# Patient Record
Sex: Female | Born: 1935 | Race: White | Hispanic: No | Smoking: Never smoker
Health system: Southern US, Community
[De-identification: ages and names within clinical notes are randomized; demographics above are authoritative.]

## PROBLEM LIST (undated history)

## (undated) DIAGNOSIS — I4891 Unspecified atrial fibrillation: Secondary | ICD-10-CM

## (undated) DIAGNOSIS — E119 Type 2 diabetes mellitus without complications: Secondary | ICD-10-CM

## (undated) DIAGNOSIS — I272 Pulmonary hypertension, unspecified: Secondary | ICD-10-CM

## (undated) DIAGNOSIS — I2510 Atherosclerotic heart disease of native coronary artery without angina pectoris: Secondary | ICD-10-CM

## (undated) DIAGNOSIS — R0902 Hypoxemia: Secondary | ICD-10-CM

## (undated) DIAGNOSIS — Z9889 Other specified postprocedural states: Secondary | ICD-10-CM

## (undated) DIAGNOSIS — E785 Hyperlipidemia, unspecified: Secondary | ICD-10-CM

## (undated) DIAGNOSIS — R001 Bradycardia, unspecified: Secondary | ICD-10-CM

## (undated) DIAGNOSIS — M1990 Unspecified osteoarthritis, unspecified site: Secondary | ICD-10-CM

## (undated) DIAGNOSIS — Z8679 Personal history of other diseases of the circulatory system: Secondary | ICD-10-CM

## (undated) DIAGNOSIS — IMO0002 Reserved for concepts with insufficient information to code with codable children: Secondary | ICD-10-CM

## (undated) DIAGNOSIS — I10 Essential (primary) hypertension: Secondary | ICD-10-CM

## (undated) DIAGNOSIS — I469 Cardiac arrest, cause unspecified: Secondary | ICD-10-CM

## (undated) DIAGNOSIS — I5032 Chronic diastolic (congestive) heart failure: Secondary | ICD-10-CM

## (undated) DIAGNOSIS — J90 Pleural effusion, not elsewhere classified: Secondary | ICD-10-CM

## (undated) HISTORY — PX: APPENDECTOMY: SHX54

## (undated) HISTORY — PX: CHOLECYSTECTOMY: SHX55

## (undated) HISTORY — PX: EYE SURGERY: SHX253

## (undated) HISTORY — PX: ABDOMINAL HYSTERECTOMY: SHX81

---

## 1998-08-10 ENCOUNTER — Encounter: Payer: Self-pay | Admitting: *Deleted

## 1998-08-10 ENCOUNTER — Ambulatory Visit (HOSPITAL_COMMUNITY): Admission: RE | Admit: 1998-08-10 | Discharge: 1998-08-10 | Payer: Self-pay | Admitting: *Deleted

## 2012-05-06 ENCOUNTER — Inpatient Hospital Stay (HOSPITAL_COMMUNITY)
Admission: RE | Admit: 2012-05-06 | Discharge: 2012-05-27 | DRG: 216 | Disposition: A | Discharge status: Skilled Nursing Facility | Payer: Medicare Other | Source: Other Acute Inpatient Hospital | Attending: Thoracic Surgery (Cardiothoracic Vascular Surgery) | Admitting: Thoracic Surgery (Cardiothoracic Vascular Surgery)

## 2012-05-06 ENCOUNTER — Encounter (HOSPITAL_COMMUNITY): Payer: Self-pay | Admitting: Physician Assistant

## 2012-05-06 ENCOUNTER — Inpatient Hospital Stay (HOSPITAL_COMMUNITY): Payer: Medicare Other

## 2012-05-06 DIAGNOSIS — R5381 Other malaise: Secondary | ICD-10-CM | POA: Diagnosis not present

## 2012-05-06 DIAGNOSIS — Z794 Long term (current) use of insulin: Secondary | ICD-10-CM

## 2012-05-06 DIAGNOSIS — J189 Pneumonia, unspecified organism: Secondary | ICD-10-CM | POA: Diagnosis present

## 2012-05-06 DIAGNOSIS — I4891 Unspecified atrial fibrillation: Secondary | ICD-10-CM

## 2012-05-06 DIAGNOSIS — I509 Heart failure, unspecified: Secondary | ICD-10-CM | POA: Diagnosis present

## 2012-05-06 DIAGNOSIS — N289 Disorder of kidney and ureter, unspecified: Secondary | ICD-10-CM | POA: Diagnosis present

## 2012-05-06 DIAGNOSIS — Z9889 Other specified postprocedural states: Secondary | ICD-10-CM

## 2012-05-06 DIAGNOSIS — D62 Acute posthemorrhagic anemia: Secondary | ICD-10-CM

## 2012-05-06 DIAGNOSIS — G9349 Other encephalopathy: Secondary | ICD-10-CM | POA: Diagnosis present

## 2012-05-06 DIAGNOSIS — IMO0002 Reserved for concepts with insufficient information to code with codable children: Secondary | ICD-10-CM

## 2012-05-06 DIAGNOSIS — IMO0001 Reserved for inherently not codable concepts without codable children: Secondary | ICD-10-CM | POA: Diagnosis present

## 2012-05-06 DIAGNOSIS — Z79899 Other long term (current) drug therapy: Secondary | ICD-10-CM

## 2012-05-06 DIAGNOSIS — D696 Thrombocytopenia, unspecified: Secondary | ICD-10-CM | POA: Diagnosis not present

## 2012-05-06 DIAGNOSIS — R7309 Other abnormal glucose: Secondary | ICD-10-CM

## 2012-05-06 DIAGNOSIS — I498 Other specified cardiac arrhythmias: Secondary | ICD-10-CM | POA: Diagnosis not present

## 2012-05-06 DIAGNOSIS — I340 Nonrheumatic mitral (valve) insufficiency: Secondary | ICD-10-CM

## 2012-05-06 DIAGNOSIS — I059 Rheumatic mitral valve disease, unspecified: Principal | ICD-10-CM

## 2012-05-06 DIAGNOSIS — R739 Hyperglycemia, unspecified: Secondary | ICD-10-CM

## 2012-05-06 DIAGNOSIS — G9340 Encephalopathy, unspecified: Secondary | ICD-10-CM

## 2012-05-06 DIAGNOSIS — T148XXA Other injury of unspecified body region, initial encounter: Secondary | ICD-10-CM

## 2012-05-06 DIAGNOSIS — J9600 Acute respiratory failure, unspecified whether with hypoxia or hypercapnia: Secondary | ICD-10-CM

## 2012-05-06 DIAGNOSIS — Z7982 Long term (current) use of aspirin: Secondary | ICD-10-CM

## 2012-05-06 DIAGNOSIS — R570 Cardiogenic shock: Secondary | ICD-10-CM

## 2012-05-06 DIAGNOSIS — Y9210 Unspecified residential institution as the place of occurrence of the external cause: Secondary | ICD-10-CM | POA: Diagnosis not present

## 2012-05-06 DIAGNOSIS — I10 Essential (primary) hypertension: Secondary | ICD-10-CM | POA: Diagnosis present

## 2012-05-06 DIAGNOSIS — G2581 Restless legs syndrome: Secondary | ICD-10-CM | POA: Diagnosis present

## 2012-05-06 DIAGNOSIS — J90 Pleural effusion, not elsewhere classified: Secondary | ICD-10-CM

## 2012-05-06 DIAGNOSIS — Y840 Cardiac catheterization as the cause of abnormal reaction of the patient, or of later complication, without mention of misadventure at the time of the procedure: Secondary | ICD-10-CM | POA: Diagnosis not present

## 2012-05-06 DIAGNOSIS — R579 Shock, unspecified: Secondary | ICD-10-CM

## 2012-05-06 DIAGNOSIS — Z7901 Long term (current) use of anticoagulants: Secondary | ICD-10-CM

## 2012-05-06 DIAGNOSIS — I2510 Atherosclerotic heart disease of native coronary artery without angina pectoris: Secondary | ICD-10-CM | POA: Diagnosis present

## 2012-05-06 DIAGNOSIS — I469 Cardiac arrest, cause unspecified: Secondary | ICD-10-CM

## 2012-05-06 DIAGNOSIS — Z8679 Personal history of other diseases of the circulatory system: Secondary | ICD-10-CM

## 2012-05-06 DIAGNOSIS — I5031 Acute diastolic (congestive) heart failure: Secondary | ICD-10-CM

## 2012-05-06 DIAGNOSIS — I2789 Other specified pulmonary heart diseases: Secondary | ICD-10-CM | POA: Diagnosis present

## 2012-05-06 DIAGNOSIS — N179 Acute kidney failure, unspecified: Secondary | ICD-10-CM | POA: Diagnosis not present

## 2012-05-06 DIAGNOSIS — E1165 Type 2 diabetes mellitus with hyperglycemia: Secondary | ICD-10-CM

## 2012-05-06 DIAGNOSIS — I999 Unspecified disorder of circulatory system: Secondary | ICD-10-CM | POA: Diagnosis present

## 2012-05-06 DIAGNOSIS — E785 Hyperlipidemia, unspecified: Secondary | ICD-10-CM | POA: Diagnosis present

## 2012-05-06 DIAGNOSIS — E876 Hypokalemia: Secondary | ICD-10-CM | POA: Diagnosis not present

## 2012-05-06 HISTORY — DX: Other specified postprocedural states: Z98.890

## 2012-05-06 HISTORY — DX: Hyperlipidemia, unspecified: E78.5

## 2012-05-06 HISTORY — DX: Personal history of other diseases of the circulatory system: Z86.79

## 2012-05-06 HISTORY — DX: Atherosclerotic heart disease of native coronary artery without angina pectoris: I25.10

## 2012-05-06 HISTORY — DX: Unspecified osteoarthritis, unspecified site: M19.90

## 2012-05-06 HISTORY — DX: Essential (primary) hypertension: I10

## 2012-05-06 HISTORY — DX: Type 2 diabetes mellitus without complications: E11.9

## 2012-05-06 LAB — BLOOD GAS, ARTERIAL
Acid-base deficit: 3.8 mmol/L — ABNORMAL HIGH (ref 0.0–2.0)
Bicarbonate: 20.8 mEq/L (ref 20.0–24.0)
FIO2: 1 %
MECHVT: 430 mL
TCO2: 22 mmol/L (ref 0–100)
pCO2 arterial: 37.7 mmHg (ref 35.0–45.0)
pH, Arterial: 7.359 (ref 7.350–7.450)
pO2, Arterial: 69.4 mmHg — ABNORMAL LOW (ref 80.0–100.0)

## 2012-05-06 LAB — CARBOXYHEMOGLOBIN
Carboxyhemoglobin: 0.7 % (ref 0.5–1.5)
Methemoglobin: 0.8 % (ref 0.0–1.5)
O2 Saturation: 40.7 %

## 2012-05-06 LAB — GLUCOSE, CAPILLARY
Glucose-Capillary: 229 mg/dL — ABNORMAL HIGH (ref 70–99)
Glucose-Capillary: 142 mg/dL — ABNORMAL HIGH (ref 70–99)
Glucose-Capillary: 78 mg/dL (ref 70–99)
Glucose-Capillary: 98 mg/dL (ref 70–99)
Glucose-Capillary: 141 mg/dL — ABNORMAL HIGH (ref 70–99)

## 2012-05-06 LAB — COMPREHENSIVE METABOLIC PANEL
ALT: 219 U/L — ABNORMAL HIGH (ref 0–35)
Alkaline Phosphatase: 86 U/L (ref 39–117)
BUN: 53 mg/dL — ABNORMAL HIGH (ref 6–23)
CO2: 23 mEq/L (ref 19–32)
GFR calc Af Amer: 52 mL/min — ABNORMAL LOW (ref >90)
GFR calc non Af Amer: 44 mL/min — ABNORMAL LOW (ref >90)
Glucose, Bld: 237 mg/dL — ABNORMAL HIGH (ref 70–99)
Potassium: 3.9 mEq/L (ref 3.5–5.1)
Sodium: 134 mEq/L — ABNORMAL LOW (ref 135–145)
Total Bilirubin: 0.7 mg/dL (ref 0.3–1.2)
Total Protein: 5.5 g/dL — ABNORMAL LOW (ref 6.0–8.3)

## 2012-05-06 LAB — TROPONIN I: Troponin I: <0.3 ng/mL (ref <0.30)

## 2012-05-06 LAB — CBC WITH DIFFERENTIAL/PLATELET
Eosinophils Absolute: 0 10*3/uL (ref 0.0–0.7)
Hemoglobin: 12.5 g/dL (ref 12.0–15.0)
Lymphocytes Relative: 7 % — ABNORMAL LOW (ref 12–46)
Lymphs Abs: 0.8 10*3/uL (ref 0.7–4.0)
MCH: 31.4 pg (ref 26.0–34.0)
MCV: 90.7 fL (ref 78.0–100.0)
Monocytes Relative: 10 % (ref 3–12)
Neutrophils Relative %: 83 % — ABNORMAL HIGH (ref 43–77)
RBC: 3.98 MIL/uL (ref 3.87–5.11)
WBC: 12.2 10*3/uL — ABNORMAL HIGH (ref 4.0–10.5)

## 2012-05-06 LAB — MAGNESIUM: Magnesium: 2.3 mg/dL (ref 1.5–2.5)

## 2012-05-06 LAB — HEPARIN LEVEL (UNFRACTIONATED): Heparin Unfractionated: 1.12 IU/mL — ABNORMAL HIGH (ref 0.30–0.70)

## 2012-05-06 LAB — PROTIME-INR: INR: 1.24 (ref 0.00–1.49)

## 2012-05-06 LAB — LACTIC ACID, PLASMA: Lactic Acid, Venous: 1.3 mmol/L (ref 0.5–2.2)

## 2012-05-06 LAB — PRO B NATRIURETIC PEPTIDE: Pro B Natriuretic peptide (BNP): 3044 pg/mL — ABNORMAL HIGH (ref 0–450)

## 2012-05-06 MED ORDER — LEVOFLOXACIN IN D5W 750 MG/150ML IV SOLN
750.0000 mg | Freq: Once | INTRAVENOUS | Status: AC
  Administered 2012-05-06: 750 mg via INTRAVENOUS
  Filled 2012-05-06: qty 150

## 2012-05-06 MED ORDER — LEVOFLOXACIN IN D5W 750 MG/150ML IV SOLN
750.0000 mg | INTRAVENOUS | Status: DC
  Administered 2012-05-08: 750 mg via INTRAVENOUS
  Filled 2012-05-06: qty 150

## 2012-05-06 MED ORDER — HEPARIN (PORCINE) IN NACL 100-0.45 UNIT/ML-% IJ SOLN
650.0000 [IU]/h | INTRAMUSCULAR | Status: DC
  Administered 2012-05-07 – 2012-05-09 (×2): 650 [IU]/h via INTRAVENOUS
  Filled 2012-05-06 (×2): qty 250

## 2012-05-06 MED ORDER — IPRATROPIUM-ALBUTEROL 18-103 MCG/ACT IN AERO: 6.0000 | INHALATION_SPRAY | RESPIRATORY_TRACT | Status: DC

## 2012-05-06 MED ORDER — PANTOPRAZOLE SODIUM 40 MG IV SOLR
40.0000 mg | INTRAVENOUS | Status: DC
  Administered 2012-05-06 – 2012-05-08 (×3): 40 mg via INTRAVENOUS
  Filled 2012-05-06 (×6): qty 40

## 2012-05-06 MED ORDER — IPRATROPIUM BROMIDE 0.02 % IN SOLN: 0.5000 mg | RESPIRATORY_TRACT | Status: DC | PRN

## 2012-05-06 MED ORDER — AMIODARONE HCL IN DEXTROSE 360-4.14 MG/200ML-% IV SOLN
1.0000 mg/min | INTRAVENOUS | Status: DC
  Administered 2012-05-06 (×2): 1 mg/min via INTRAVENOUS
  Filled 2012-05-06: qty 200

## 2012-05-06 MED ORDER — VANCOMYCIN HCL 10 G IV SOLR
1250.0000 mg | INTRAVENOUS | Status: DC
  Administered 2012-05-06 – 2012-05-07 (×2): 1250 mg via INTRAVENOUS
  Filled 2012-05-06 (×3): qty 1250

## 2012-05-06 MED ORDER — DEXTROSE 5 % IV SOLN
1.0000 g | Freq: Every day | INTRAVENOUS | Status: DC
  Administered 2012-05-06: 1 g via INTRAVENOUS
  Filled 2012-05-06 (×2): qty 10

## 2012-05-06 MED ORDER — SODIUM CHLORIDE 0.9 % IV SOLN
INTRAVENOUS | Status: DC
  Administered 2012-05-06 – 2012-05-12 (×3): via INTRAVENOUS
  Administered 2012-05-13: 10 mL/h via INTRAVENOUS
  Administered 2012-05-17: 20 mL via INTRAVENOUS

## 2012-05-06 MED ORDER — DIGOXIN 0.25 MG/ML IJ SOLN
0.5000 mg | Freq: Once | INTRAMUSCULAR | Status: AC
  Administered 2012-05-06: 0.5 mg via INTRAVENOUS
  Filled 2012-05-06: qty 2

## 2012-05-06 MED ORDER — ASPIRIN 325 MG PO TABS
325.0000 mg | ORAL_TABLET | Freq: Every day | ORAL | Status: DC
  Administered 2012-05-06 – 2012-05-08 (×2): 325 mg via ORAL
  Filled 2012-05-06 (×4): qty 1

## 2012-05-06 MED ORDER — ALBUTEROL SULFATE (5 MG/ML) 0.5% IN NEBU
2.5000 mg | INHALATION_SOLUTION | RESPIRATORY_TRACT | Status: DC
  Administered 2012-05-06 – 2012-05-08 (×14): 2.5 mg via RESPIRATORY_TRACT
  Filled 2012-05-06 (×14): qty 0.5

## 2012-05-06 MED ORDER — HEPARIN SODIUM (PORCINE) 5000 UNIT/ML IJ SOLN
5000.0000 [IU] | Freq: Three times a day (TID) | INTRAMUSCULAR | Status: DC
  Administered 2012-05-06: 5000 [IU] via SUBCUTANEOUS
  Filled 2012-05-06 (×4): qty 1

## 2012-05-06 MED ORDER — HEPARIN BOLUS VIA INFUSION
3000.0000 [IU] | Freq: Once | INTRAVENOUS | Status: AC
  Administered 2012-05-06: 3000 [IU] via INTRAVENOUS
  Filled 2012-05-06: qty 3000

## 2012-05-06 MED ORDER — IPRATROPIUM-ALBUTEROL 18-103 MCG/ACT IN AERO: 6.0000 | INHALATION_SPRAY | RESPIRATORY_TRACT | Status: DC | PRN

## 2012-05-06 MED ORDER — AMIODARONE HCL IN DEXTROSE 360-4.14 MG/200ML-% IV SOLN
0.5000 mg/min | INTRAVENOUS | Status: DC
  Administered 2012-05-06 – 2012-05-09 (×6): 0.5 mg/min via INTRAVENOUS
  Filled 2012-05-06 (×13): qty 200

## 2012-05-06 MED ORDER — FUROSEMIDE 10 MG/ML IJ SOLN
40.0000 mg | Freq: Three times a day (TID) | INTRAMUSCULAR | Status: DC
  Administered 2012-05-06 – 2012-05-08 (×5): 40 mg via INTRAVENOUS
  Filled 2012-05-06 (×8): qty 4

## 2012-05-06 MED ORDER — HEPARIN (PORCINE) IN NACL 100-0.45 UNIT/ML-% IJ SOLN
950.0000 [IU]/h | INTRAMUSCULAR | Status: DC
  Administered 2012-05-06: 950 [IU]/h via INTRAVENOUS
  Filled 2012-05-06: qty 250

## 2012-05-06 MED ORDER — MIDAZOLAM HCL 5 MG/ML IJ SOLN
1.0000 mg/h | INTRAMUSCULAR | Status: DC
  Administered 2012-05-06: 1 mg/h via INTRAVENOUS
  Administered 2012-05-07: 2 mg/h via INTRAVENOUS
  Filled 2012-05-06 (×2): qty 10

## 2012-05-06 MED ORDER — AMIODARONE HCL IN DEXTROSE 360-4.14 MG/200ML-% IV SOLN
INTRAVENOUS | Status: AC
  Administered 2012-05-06: 1 mg/min via INTRAVENOUS
  Filled 2012-05-06: qty 200

## 2012-05-06 MED ORDER — AMIODARONE LOAD VIA INFUSION
150.0000 mg | Freq: Once | INTRAVENOUS | Status: AC
  Administered 2012-05-06: 150 mg via INTRAVENOUS
  Filled 2012-05-06: qty 83.34

## 2012-05-06 MED ORDER — SODIUM CHLORIDE 0.9 % IV SOLN
25.0000 ug/h | INTRAVENOUS | Status: DC
  Administered 2012-05-06: 25 ug/h via INTRAVENOUS
  Filled 2012-05-06: qty 50

## 2012-05-06 MED ORDER — BIOTENE DRY MOUTH MT LIQD
1.0000 "application " | Freq: Four times a day (QID) | OROMUCOSAL | Status: DC
  Administered 2012-05-06 – 2012-05-08 (×10): 15 mL via OROMUCOSAL

## 2012-05-06 MED ORDER — CHLORHEXIDINE GLUCONATE 0.12 % MT SOLN
15.0000 mL | Freq: Two times a day (BID) | OROMUCOSAL | Status: DC
  Administered 2012-05-06 – 2012-05-07 (×4): 15 mL via OROMUCOSAL
  Filled 2012-05-06 (×4): qty 15

## 2012-05-06 MED ORDER — MILRINONE IN DEXTROSE 20 MG/100ML IV SOLN
0.1250 ug/kg/min | INTRAVENOUS | Status: DC
  Administered 2012-05-06 – 2012-05-07 (×3): 0.25 ug/kg/min via INTRAVENOUS
  Administered 2012-05-08: 0.125 ug/kg/min via INTRAVENOUS
  Filled 2012-05-06 (×5): qty 100

## 2012-05-06 MED ORDER — INSULIN ASPART 100 UNIT/ML ~~LOC~~ SOLN
2.0000 [IU] | SUBCUTANEOUS | Status: DC
  Administered 2012-05-06 (×2): 2 [IU] via SUBCUTANEOUS
  Administered 2012-05-06: 6 [IU] via SUBCUTANEOUS
  Administered 2012-05-06: 2 [IU] via SUBCUTANEOUS
  Administered 2012-05-07: 4 [IU] via SUBCUTANEOUS
  Administered 2012-05-07 – 2012-05-08 (×4): 2 [IU] via SUBCUTANEOUS
  Administered 2012-05-08: 4 [IU] via SUBCUTANEOUS
  Administered 2012-05-08: 2 [IU] via SUBCUTANEOUS

## 2012-05-06 MED ORDER — ALBUTEROL SULFATE (5 MG/ML) 0.5% IN NEBU: 2.5000 mg | INHALATION_SOLUTION | RESPIRATORY_TRACT | Status: DC | PRN

## 2012-05-06 MED ORDER — FUROSEMIDE 10 MG/ML IJ SOLN
40.0000 mg | Freq: Two times a day (BID) | INTRAMUSCULAR | Status: DC
  Administered 2012-05-06: 40 mg via INTRAVENOUS
  Filled 2012-05-06 (×3): qty 4

## 2012-05-06 MED ORDER — IPRATROPIUM BROMIDE 0.02 % IN SOLN
0.5000 mg | RESPIRATORY_TRACT | Status: DC
  Administered 2012-05-06 – 2012-05-08 (×14): 0.5 mg via RESPIRATORY_TRACT
  Filled 2012-05-06 (×14): qty 2.5

## 2012-05-06 MED FILL — Fentanyl Citrate Inj 0.05 MG/ML: INTRAMUSCULAR | Qty: 2 | Status: AC

## 2012-05-06 NOTE — Progress Notes (Signed)
GPC in the tracheal aspirate   Vanc added to levaquin; deescalate antibiotics according to culture results.

## 2012-05-06 NOTE — Progress Notes (Signed)
Amiodarone Drug - Drug Interaction Consult Note  Recommendations: Monitor QTc closely while patient is both amiodarone and levaquin.  Amiodarone is metabolized by the cytochrome P450 system and therefore has the potential to cause many drug interactions. Amiodarone has an average plasma half-life of 50 days (range 20 to 100 days).   There is potential for drug interactions to occur several weeks or months after stopping treatment and the onset of drug interactions may be slow after initiating amiodarone.   []  Statins: Increased risk of myopathy. Simvastatin- restrict dose to 20mg  daily. Other statins: counsel patients to report any muscle pain or weakness immediately.  []  Anticoagulants: Amiodarone can increase anticoagulant effect. Consider warfarin dose reduction. Patients should be monitored closely and the dose of anticoagulant altered accordingly, remembering that amiodarone levels take several weeks to stabilize.  []  Antiepileptics: Amiodarone can increase plasma concentration of phenytoin, phenytoin dose should be reduced. Note that small changes in phenytoin dose can result in large changes in phenytoin levels. Monitor patient closely and counsel on signs of toxicity.  []  Beta blockers: increased risk of bradycardia, AV block and myocardial depression. Sotalol - avoid concomitant use.  []   Calcium channel blockers (diltiazem and verapamil): increased risk of bradycardia, AV block and myocardial depression.  []   Cyclosporine: Amiodarone increases levels of cyclosporine. Reduced dose of cyclosporine is recommended.  []  Digoxin dose should be halved when amiodarone is started.  []  Diuretics: increased risk of cardiotoxicity if hypokalemia occurs.  []  Oral hypoglycemic agents (glyburide, glipizide, glimepiride): increased risk of hypoglycemia. Patient's glucose levels should be monitored closely when initiating amiodarone therapy.   [x]  Drugs that prolong the QT interval: Concurrent  therapy is contraindicated due to the increased risk of torsades de pointes; . Antibiotics: e.g. fluoroquinolones, erythromycin. . Antiarrhythmics: e.g. quinidine, procainamide, disopyramide, sotalol. . Antipsychotics: e.g. phenothiazines, haloperidol.  . Lithium, tricyclic antidepressants, and methadone. Thank You,  Dorna Leitz, PharmD, BCPS

## 2012-05-06 NOTE — Progress Notes (Signed)
RN increased fio2 to 100% due to desat in the low 80's

## 2012-05-06 NOTE — Progress Notes (Signed)
ANTIBIOTIC CONSULT NOTE - INITIAL  Pharmacy Consult for Levaquin and Rocephin Indication: rule out pneumonia  Allergies not on file  Patient Measurements: Height: 5\' 2"  (157.5 cm) Weight: 169 lb 1.5 oz (76.7 kg) IBW/kg (Calculated) : 50.1  Vital Signs: BP: 138/83 mmHg (02/11 0130) Pulse Rate: 115 (02/11 0130)  Labs (from [facility]): WBC 11.8 Hgb 14 Hct 41.2 Plt 252  SCr 0.8  No results found for this basename: WBC, HGB, PLT, LABCREA, CREATININE,  in the last 72 hours CrCl is unknown because no creatinine reading has been taken. No results found for this basename: VANCOTROUGH, VANCOPEAK, VANCORANDOM, GENTTROUGH, GENTPEAK, GENTRANDOM, TOBRATROUGH, TOBRAPEAK, TOBRARND, AMIKACINPEAK, AMIKACINTROU, AMIKACIN,  in the last 72 hours   Microbiology: No results found for this or any previous visit (from the past 720 hour(s)).  Medical History: Htn DM HLD  Medications:  Norasc  Nadolol  Aldactazide  Metformin  Lantus  Amaryl  Celexa  Requip  Trazodone  Pravachol  Assessment: 79 yo female with VDRF, possible PNA for empiric antibiotics  Plan:  Rocephin 1 g IV q24h Levaquin 750 mg IV q48h  Eddie Candle 05/06/2012,3:00 AM

## 2012-05-06 NOTE — H&P (Addendum)
PULMONARY  / CRITICAL CARE MEDICINE  Name: [name] [name] MRN: [id number] DOB: Mar 19, 1925    ADMISSION DATE:  05/06/2012 CONSULTATION DATE:  05/06/2012  REFERRING MD :  Transfer from [facility]  CHIEF COMPLAINT:  Acute respiratory failure  BRIEF PATIENT DESCRIPTION:  79 yo presented to [facility] with dyspnea for 5 days, suffered PEA cardiac arrest, was briefly resuscitated with ROSC and transferred to [facility] for farther management. EMD Dr Marchelle Gearing note: 6:29 AM on 05/06/2012: per carelink hx - patient coded after getting 1 dose azithromycin   SIGNIFICANT EVENTS / STUDIES:  2/11  Transferred form [facility] after suffering PEA cardiac arrest.  CT angio (from [facility]) >>> no PE, suspicion for elevated R heart pressure.  LINES / TUBES: Foley 2/11 >>> OETT 2/11 >>> OGT 2/11 >>> R fem TLC 2/11 >>> 2/11 L Vandiver TLC 2/11 >>>  CULTURES: 2/11  Blood >>> 2/11  Respiratory >>>  ANTIBIOTICS: 2/11  Ceftriaxone >>> 2/11  Levaquin >>>  The patient is encephalopathic and unable to provide history, which was obtained for available medical records.  HISTORY OF PRESENT ILLNESS:    PAST MEDICAL HISTORY :  No past medical history on file. No past surgical history on file. Prior to Admission medications   Not on File   Allergies  Allergen Reactions  . Codeine    FAMILY HISTORY:  No family history on file.  SOCIAL HISTORY:  has no tobacco, alcohol, and drug history on file.  REVIEW OF SYSTEMS:  Unable to provide.  INTERVAL HISTORY:  VITAL SIGNS: Temp:  [97.8 F (36.6 C)-98.1 F (36.7 C)] 98.1 F (36.7 C) (02/11 0400) Pulse Rate:  [54-120] 54 (02/11 0500) Resp:  [14-28] 14 (02/11 0500) BP: (91-138)/(45-83) 125/63 mmHg (02/11 0500) SpO2:  [94 %-100 %] 98 % (02/11 0500) FiO2 (%):  [90 %-100 %] 90 % (02/11 0500) Weight:  [76.7 kg (169 lb 1.5 oz)] 76.7 kg (169 lb 1.5 oz) (02/11 0130)  HEMODYNAMICS: CVP:  [13 mmHg] 13 mmHg  VENTILATOR SETTINGS: Vent Mode:  [-] PRVC FiO2 (%):  [90 %-100 %] 90  % Set Rate:  [14 bmp] 14 bmp Vt Set:  [430 mL] 430 mL PEEP:  [5 cmH20] 5 cmH20 Plateau Pressure:  [15 cmH20-24 cmH20] 15 cmH20  INTAKE / OUTPUT: Intake/Output     02/10 0701 - 02/11 0700   I.V. (mL/kg) 197.5 (2.6)   IV Piggyback 50   Total Intake(mL/kg) 247.5 (3.2)   Net +247.5         PHYSICAL EXAMINATION: General:  Appears comfortable, mechanically ventilated, synchronous Neuro:  Encephalopathic, nonfocal, cough / gag diminished HEENT:  PERRL, OETT / OGT Cardiovascular:  Irregularly irregular, tachycardic Lungs:  Bilateral diminished air entry, bibasilar rales Abdomen:  Soft, nontender, bowel sounds diminished Musculoskeletal:  Moves all extremities, trace edema Skin:  Intact  LABS:  Recent Labs Lab 05/06/12 0229 05/06/12 0230 05/06/12 0239 05/06/12 0318  HGB 12.5  --   --   --   WBC 12.2*  --   --   --   PLT 205  --   --   --   NA 134*  --   --   --   K 3.9  --   --   --   CL 100  --   --   --   CO2 23  --   --   --   GLUCOSE 237*  --   --   --   BUN 53*  --   --   --  CREATININE 1.10  --   --   --   CALCIUM 8.7  --   --   --   AST 168*  --   --   --   ALT 219*  --   --   --   ALKPHOS 86  --   --   --   BILITOT 0.7  --   --   --   PROT 5.5*  --   --   --   ALBUMIN 3.1*  --   --   --   APTT  --   --   --  27  INR  --   --   --  1.24  LATICACIDVEN  --   --  1.3  --   TROPONINI <0.30  --   --   --   PROBNP 3044.0*  --   --   --   PHART  --  7.359  --   --   PCO2ART  --  37.7  --   --   PO2ART  --  69.4*  --   --     Recent Labs Lab 05/06/12 0240  GLUCAP 229*    CXR:  2/11 >>> Hardware in good position, bilateral effusions / airspace disease  ASSESSMENT / PLAN:  PULMONARY A:  Acute respiratory failure likely secondary to acute pulmonary edema.  Less likely CAP. P:   Gaol SpO2>92, pH>7.30 Full mechanical support Daily SBT Trend ABG / CXR Combivent  CARDIOVASCULAR A: PEA cardiac arrest.  Non-specific ST changes.  Atrial fibrillation with  RVR.  Possible cardiogenic shock.  Possible pulmonary hypertension. Goal MAP>60, HR < 110 ASA Levophed, titrate to off Lactate Troponin 12 lead ECG 2D echo (L and R) Deemed not to be a candidate for Therapeutic Hypothermia upon transfer Place new CVL, d/c femoral TLC  RENAL A:  Normal renal function / electrolyts. P:   CVP Trend BMP IVF@KVO  Lasix 40 q12h x 2 days Mg level  GASTROINTESTINAL A:  Congestive hepatopathy. P:   NPO as intubated TF if remains intubated > 24 hours Protonix for GI Px Trend LFTs  HEMATOLOGIC A:  No active issues. P:  Trend CBC Heparin for DVT Px  INFECTIOUS A:  Suspected CAP. P:   Cultures and antibiotics as above PCT  ENDOCRINE  A:  DM2.  Hyperglycemia. P:   ICU Glycemic Control Protocol, Phase 1  NEUROLOGIC A:  Acute encephalopathy.  Possible anoxia. P:   Goal RASS 0 to -1 Daily WUA Fentanyl / Versed gtt  TODAY'S SUMMARY: Acute pulmonary edema - Lasix.  PEA arrest - echo, Troponin.  DM - SSI.  Acute respiratory failure - mechanical support.  Empirical abx for CAP.  I have personally obtained a history, examined the patient, evaluated laboratory and imaging results, formulated the assessment and plan and placed orders.  CRITICAL CARE:  The patient is critically ill with multiple organ systems failure and requires high complexity decision making for assessment and support, frequent evaluation and titration of therapies, application of advanced monitoring technologies and extensive interpretation of multiple databases. Critical Care Time devoted to patient care services described in this note is 40 minutes.   Lonia Farber, MD  Pulmonary and Critical Care Medicine [facility] Pager: [phone]  05/06/2012, 5:24 AM

## 2012-05-06 NOTE — Progress Notes (Signed)
INITIAL NUTRITION ASSESSMENT  DOCUMENTATION CODES Per approved criteria  -Obesity Unspecified   INTERVENTION:  If EN started, recommend Promote formula at 20 ml/hr with Prostat liquid protein 5 times daily to provide 980 kcals (72% of estimated kcal needs), 105 gm protein (100% of estimated protein needs), 403 ml of free water  Recommend liquid MVI daily RD to follow for nutrition care plan  NUTRITION DIAGNOSIS: Inadequate oral intake related to inability to eat as evidenced by NPO status  Goal: Initiate EN support within next 24-48 hours if prolonged intubation expected  Monitor:  EN initiation, respiratory status, weight, labs, I/O's  Reason for Assessment: VDRF  79 y.o. female  Admitting Dx: acute respiratory failure & encephalopathy  ASSESSMENT: Patient presented to [facility] with dyspnea for 5 days; suffered PEA cardiac arrest and was briefly resuscitated with ROSC; transferred to [facility] for further management; s/p central venous catheter placement 2/11.  Patient is currently intubated on ventilator support ---> OGT in place MV: 7.5 Temp: 36.7  Height: Ht Readings from Last 1 Encounters:  05/06/12 5\' 2"  (1.575 m)    Weight: Wt Readings from Last 1 Encounters:  05/06/12 169 lb 1.5 oz (76.7 kg)    Ideal Body Weight: 110 lb  % Ideal Body Weight: 153%  Wt Readings from Last 10 Encounters:  05/06/12 169 lb 1.5 oz (76.7 kg)    Usual Body Weight: unable to obtain  % Usual Body Weight: ---  BMI:  Body mass index is 30.92 kg/(m^2).  Estimated Nutritional Needs: Kcal: 1350 Protein: 100-110 gm Fluid: > 1.5 L  Skin: Intact  Diet Order: NPO  EDUCATION NEEDS: -No education needs identified at this time   Intake/Output Summary (Last 24 hours) at 05/06/12 0941 Last data filed at 05/06/12 0900  Gross per 24 hour  Intake  237.5 ml  Output   1240 ml  Net -1002.5 ml    Labs:   Recent Labs Lab 05/06/12 0229  NA 134*  K 3.9  CL 100  CO2 23   BUN 53*  CREATININE 1.10  CALCIUM 8.7  MG 2.3  GLUCOSE 237*    CBG (last 3)   Recent Labs  05/06/12 0240 05/06/12 0816  GLUCAP 229* 142*    Scheduled Meds: . ipratropium  0.5 mg Nebulization Q4H   And  . albuterol  2.5 mg Nebulization Q4H  . antiseptic oral rinse  1 application Mouth Rinse QID  . aspirin  325 mg Oral Daily  . cefTRIAXone (ROCEPHIN)  IV  1 g Intravenous QHS  . chlorhexidine  15 mL Mouth/Throat BID  . furosemide  40 mg Intravenous Q12H  . heparin subcutaneous  5,000 Units Subcutaneous Q8H  . insulin aspart  2-6 Units Subcutaneous Q4H  . [START ON 05/08/2012] levofloxacin (LEVAQUIN) IV  750 mg Intravenous Q48H  . pantoprazole (PROTONIX) IV  40 mg Intravenous Q24H    Continuous Infusions: . sodium chloride 20 mL/hr at 05/06/12 0900  . fentaNYL infusion INTRAVENOUS 25 mcg/hr (05/06/12 0900)  . midazolam (VERSED) infusion 1 mg/hr (05/06/12 0900)    No past medical history on file.  No past surgical history on file.  Maureen Chatters, RD, LDN Pager #: [phone] After-Hours Pager #: [phone]

## 2012-05-06 NOTE — Consult Note (Signed)
Cardiology Consult Note   Patient ID: [name] [name] MRN: [id number], DOB/AGE: 77-Nov-1926   Admit date: 05/06/2012 Date of Consult: 05/06/2012  Primary Physician: No primary provider on file. Primary Cardiologist: Dr. Dulce Sellar- [facility]  Reason for consult: evaluation/management of a-fib post-PEA arrest  HPI: [name] [name] is a 79 y.o. female with PMHx s/f CAD (cath in 1995- 50-60% RCA stenosis), hypertensive heart disease, type 2 DM, HLD, HTN, OA and RA who was transferred from [facility] to [facility] today with respiratory arrest, PEA arrest with resuscitation and ROSC now with a-fib and intermittent RVR.   Gathering from the primary team's H&P and supporting documents from [place], the patient had complained of shortness of breath x 5 days and hemoptysis two weeks ago (none since). She noted associated lightheadedness and "not being able to sleep" due to SOB. Per the son's reports, she had also endorsed a 3 lbs weight gain, orthopnea, cough productive of white sputum, diarrhea and malaise during this time as well. No prior caths or echos. Nuc stress test 12/2010- no evidence of ischemia, LVEF 65%. She has been on Coumadin in the past for unclear reasons, but was taken off due to a fall. She is otherwise coordinated at home. No unilateral weakness or numbness.   She presented to [facility] yesterday with these complaints.   EKG yesterday indicated atrial fibrillation, LAD, rate 121, some lateral ST depressions (~1 mm). She was in NSR on a prior EKG from 05/13. CBC revealed a mild leukocytosis, otherwise WNL. BMET: Na 135, BUN 50, Cr 0.8.  Initial trop-I <0.01. pBNP 4240. AST elevated at 79, ALT 179.   CXR revealed probable CHF with mild edema, effusions and lower lobe atelectasis vs consolidation. CT-A revealed no evidence of PE, cardiomegaly with acute pulmonary edema, moderate bilateral pleural effusions, flattened interventricular septum supported elevated R heart  filling pressures, otherwise benign findings.   ED notes indicate cardiology did see her at [facility]. She received azithromycin and diltiazem IV. Then complained of feeling diaphoretic and uncomfortable. She was given benadryl, zofran and tylenol. She then developed agonal breathing, became unresponsive without a palpable pulse and PEA on the monitor. CPR was initiated, epi x 1 with ROSC shortly after. She was intubated, sedated and transferred. BP currently labile (SBP) in the 90s. Rate on telemetry 110-120 bpm. Not on pressors. Not felt to be a candidate for [facility]. Placed on empiric abx. CVP 12-13. Blood and sputum cultures pending.   Problem List: Past Medical History  Diagnosis Date  . Hypertension   . Hyperlipidemia   . Type 2 diabetes mellitus     No past surgical history on file.   Allergies:  Allergies  Allergen Reactions  . Codeine Other (See Comments)    Unknown     Home Medications: Prior to Admission medications   Medication Sig Start Date End Date Taking? Authorizing Provider  amLODipine (NORVASC) 5 MG tablet Take 5 mg by mouth daily.   Yes Historical Provider, MD  citalopram (CELEXA) 10 MG tablet Take 10 mg by mouth daily.   Yes Historical Provider, MD  glimepiride (AMARYL) 1 MG tablet Take 1 mg by mouth daily before breakfast.   Yes Historical Provider, MD  insulin glargine (LANTUS) 100 UNIT/ML injection Inject 18 Units into the skin at bedtime.   Yes Historical Provider, MD  metFORMIN (GLUCOPHAGE) 500 MG tablet Take 500 mg by mouth 2 (two) times daily with a meal.   Yes Historical Provider, MD  nadolol (CORGARD) 40  MG tablet Take 40 mg by mouth daily.   Yes Historical Provider, MD  pravastatin (PRAVACHOL) 40 MG tablet Take 40 mg by mouth daily.   Yes Historical Provider, MD  rOPINIRole (REQUIP) 0.5 MG tablet Take 0.5 mg by mouth at bedtime.   Yes Historical Provider, MD  spironolactone-hydrochlorothiazide (ALDACTAZIDE) 25-25 MG per tablet Take 0.5 tablets by mouth  daily.   Yes Historical Provider, MD  traZODone (DESYREL) 50 MG tablet Take 50 mg by mouth at bedtime.   Yes Historical Provider, MD    Inpatient Medications:  . ipratropium  0.5 mg Nebulization Q4H   And  . albuterol  2.5 mg Nebulization Q4H  . antiseptic oral rinse  1 application Mouth Rinse QID  . aspirin  325 mg Oral Daily  . chlorhexidine  15 mL Mouth/Throat BID  . furosemide  40 mg Intravenous Q12H  . heparin subcutaneous  5,000 Units Subcutaneous Q8H  . insulin aspart  2-6 Units Subcutaneous Q4H  . [START ON 05/08/2012] levofloxacin (LEVAQUIN) IV  750 mg Intravenous Q48H  . pantoprazole (PROTONIX) IV  40 mg Intravenous Q24H   Prescriptions prior to admission  Medication Sig Dispense Refill  . amLODipine (NORVASC) 5 MG tablet Take 5 mg by mouth daily.      . citalopram (CELEXA) 10 MG tablet Take 10 mg by mouth daily.      Marland Kitchen glimepiride (AMARYL) 1 MG tablet Take 1 mg by mouth daily before breakfast.      . insulin glargine (LANTUS) 100 UNIT/ML injection Inject 18 Units into the skin at bedtime.      . metFORMIN (GLUCOPHAGE) 500 MG tablet Take 500 mg by mouth 2 (two) times daily with a meal.      . nadolol (CORGARD) 40 MG tablet Take 40 mg by mouth daily.      . pravastatin (PRAVACHOL) 40 MG tablet Take 40 mg by mouth daily.      Marland Kitchen rOPINIRole (REQUIP) 0.5 MG tablet Take 0.5 mg by mouth at bedtime.      Marland Kitchen spironolactone-hydrochlorothiazide (ALDACTAZIDE) 25-25 MG per tablet Take 0.5 tablets by mouth daily.      . traZODone (DESYREL) 50 MG tablet Take 50 mg by mouth at bedtime.        No family history on file.   History   Social History  . Marital Status: Married    Spouse Name: N/A    Number of Children: N/A  . Years of Education: N/A   Occupational History  . Not on file.   Social History Main Topics  . Smoking status: Not on file  . Smokeless tobacco: Not on file  . Alcohol Use: Not on file  . Drug Use: Not on file  . Sexually Active: Not on file   Other Topics  Concern  . Not on file   Social History Narrative  . No narrative on file     Review of Systems: General: positive for malaise, weight gain, negative for chills, fever, night sweats or weight changes.  Cardiovascular: positive for shortness of breath, orthopnea, negative for chest pain, dyspnea on exertion, edema, orthopnea, palpitations, paroxysmal nocturnal dyspnea Dermatological: negative for rash Respiratory: positive for productive cough, negative for wheezing Urologic: negative for hematuria Abdominal: negative for nausea, vomiting, diarrhea, bright red blood per rectum, melena, or hematemesis Neurologic: positive for lightheadedness, negative for visual changes, syncope All other systems reviewed and are otherwise negative except as noted above.  Physical Exam: Blood pressure 93/58, pulse 118, temperature 96.8  F (36 C), temperature source Axillary, resp. rate 15, height 5\' 2"  (1.575 m), weight 76.7 kg (169 lb 1.5 oz), SpO2 99.00%.    General: Elderly, intubated, awake, responsive to name, well developed, well nourished Head: Normocephalic, atraumatic, sclera non-icteric, no xanthomas, nares are without discharge.  Neck: Negative for carotid bruits. JVP 10 cm.  Lungs: Bibasilar rales and rhonchi appreciated, ventilatory stridor noted. Breathing is assisted by ventilator.  Heart: Irregularly irregular, tachycardic, with S1 S2. No murmurs, rubs, or gallops appreciated. Abdomen: Soft, non-tender, mildly distended vs hepatomegaly with normoactive bowel sounds. No rebound/guarding. No obvious abdominal masses. Msk:  Strength and tone appears normal for age. Extremities: No clubbing, cyanosis or edema.  Distal pedal pulses are 2+ and equal bilaterally. Neuro:  Awake, responsive to name. Moves all extremities spontaneously. Psych:  Responds to questions appropriately with a normal affect.  Labs: Recent Labs     05/06/12  0229  WBC  12.2*  HGB  12.5  HCT  36.1  MCV  90.7  PLT   205   No results found for this basename: VITAMINB12, FOLATE, FERRITIN, TIBC, IRON, RETICCTPCT,  in the last 72 hours No results found for this basename: DDIMER,  in the last 72 hours  Recent Labs Lab 05/06/12 0229  NA 134*  K 3.9  CL 100  CO2 23  BUN 53*  CREATININE 1.10  CALCIUM 8.7  PROT 5.5*  BILITOT 0.7  ALKPHOS 86  ALT 219*  AST 168*  GLUCOSE 237*   No results found for this basename: HGBA1C,  in the last 72 hours Recent Labs     05/06/12  0229  TROPONINI  <0.30   No components found with this basename: POCBNP,  No results found for this basename: CHOL, HDL, LDLCALC, TRIG, CHOLHDL, LDLDIRECT,  in the last 72 hours No results found for this basename: TSH, T4TOTAL, FREET3, T3FREE, THYROIDAB,  in the last 72 hours  Radiology/Studies: Dg Chest Port 1 View  05/06/2012  *RADIOLOGY REPORT*  Clinical Data: Endotracheal tube and central line placement.  PORTABLE CHEST - 1 VIEW  Comparison: Chest radiograph performed 05/05/2012  Findings: The patient's endotracheal tube is seen ending 3-4 cm above the carina.  The left subclavian line is noted ending just within the right atrium; this could be retracted 1-2 cm.  Small bilateral pleural effusions are again seen, with bibasilar airspace opacification.  This may reflect mild interstitial edema. No pneumothorax is identified.  The cardiomediastinal silhouette is borderline enlarged. Calcification is noted in the aortic arch.  No acute osseous abnormalities are seen.  Degenerative change is noted at both glenohumeral joints.  IMPRESSION:  1.  Endotracheal tube seen ending 3-4 cm above the carina. 2.  Left subclavian line seen ending just within the right atrium; this could be retracted 1-2 cm. 3.  Small bilateral pleural effusions, with bibasilar airspace opacification; this may reflect mild interstitial edema. 4.  Borderline cardiomegaly.  These results were called by telephone on 05/06/2012 at 02:27 a.m. to [facility] on [id number], who verbally  acknowledged these results.   Original Report Authenticated By: Tonia Ghent, M.D.     EKG: atrial fibrillation, LAD, rate 121, some lateral ST depressions (~1 mm), Q waves V1-V2  ASSESSMENT:   1. PEA arrest   2. Respiratory failure 3. Acute diastolic CHF with cardiogenic shock 4. New onset atrial fibrillation with RVR 5. Leukocytosis 6. Transaminitis 7. Hypertension 8. Hyperlipidemia 9. Type 2 DM  DISCUSSION/PLAN:  CHF decompensation may be multifactorial in the  setting of an underlying respiratory infection and a-fib with RVR occurring x 1 week. She has had a normal stress test last year. Cardiac markers here and at [facility] have been WNL. She has not endorsed ischemic symptoms leading up to yesterday. She is volume overloaded on exam- prominent JVD, abdominal distention, pulmonary rales. Suspect transaminitis is from hepatic congestion. Will continue to diurese, increase Lasix to 80mg  IV BID. Check 2D echo. Awaiting requested documents from cardiology office. Regarding atrial fibrillation, rates are holding between 110-120 bpm on average. BP 90/50s. The patient acutely decompensated and coded yesterday after receiving diltiazem and azithromycin yesterday. Concern is that rate-control may lead to hypotension and further decompensation. Could try amiodarone. Will need to discuss candidacy of anticoagulation with MD. Based on echo findings, consider R/LHC to assess hemodynamics and ischemic etiologies to CHF/a-fib. With labile BP, would like evaluate RV function. Check TSH. Heparinize. Obtain co-ox.    Signed, R. Hurman Horn, PA-C 05/06/2012, 12:39 PM   Patient seen and examined with Hurman Horn, PA-C. We discussed all aspects of the encounter. I agree with the assessment and plan as stated above.   79 y/o women with h/o severe HTN and non-obs CAD by cath in 1995. Presented with 1 week h/o progressive fatigue and edema. Found to be in decompensated HF with AF & RVR. Treated with  diltiazem and developed respiratory arrest. CT negative for PE. Now intubated. Diuresing well. BP remains soft with systolics in 90s.  I did bedside echo LV and RV EF appear grossly normal. There is severe central MR but I cannot see valve well. My suspicion is that the initial event was silent AF int he setting of diastolic dysfunction leading to pulmonary edema. However MR may also be part of the primary picture. Her rate remains quite fast and co-ox is very low. CVP still high.  Plan: --Start amiodarone for rate control.  --Change lasix to 40 IV tid.  --Start milrinone as BP tolerates.  --Will need formal echo to reassess EF and MV --Heparinize --Suspect she will need  TEE/DC-CV at some point. If BP is unstable maybe sooner rather than later  Appreciate CCM's assistance with vent management. We will follow closely. Family updated at bedside.   [name] Pfannenstiel,MD 1:28 PM

## 2012-05-06 NOTE — Progress Notes (Signed)
ANTIBIOTIC CONSULT NOTE - INITIAL  Pharmacy Consult for Vancomycin Indication: pneumonia  Allergies  Allergen Reactions  . Codeine Other (See Comments)    Unknown     Patient Measurements: Height: 5\' 2"  (157.5 cm) Weight: 169 lb 1.5 oz (76.7 kg) IBW/kg (Calculated) : 50.1  Vital Signs: Temp: 97.4 F (36.3 C) (02/11 1634) Temp src: Oral (02/11 1634) BP: 108/53 mmHg (02/11 1900) Pulse Rate: 99 (02/11 1900) Intake/Output from previous day: 02/10 0701 - 02/11 0700 In: 190.5 [I.V.:140.5; IV Piggyback:50] Out: 900 [Urine:900] Intake/Output from this shift:    Labs:  Recent Labs  05/06/12 0229  WBC 12.2*  HGB 12.5  PLT 205  CREATININE 1.10   Estimated Creatinine Clearance: 35.8 ml/min (by C-G formula based on Cr of 1.1). No results found for this basename: VANCOTROUGH, Leodis Binet, VANCORANDOM, GENTTROUGH, GENTPEAK, GENTRANDOM, TOBRATROUGH, TOBRAPEAK, TOBRARND, AMIKACINPEAK, AMIKACINTROU, AMIKACIN,  in the last 72 hours   Microbiology: Recent Results (from the past 720 hour(s))  MRSA PCR SCREENING     Status: None   Collection Time    05/06/12  2:30 AM      Result Value Range Status   MRSA by PCR NEGATIVE  NEGATIVE Final   Comment:            The GeneXpert MRSA Assay (FDA     approved for NASAL specimens     only), is one component of a     comprehensive MRSA colonization     surveillance program. It is not     intended to diagnose MRSA     infection nor to guide or     monitor treatment for     MRSA infections.  CULTURE, RESPIRATORY (NON-EXPECTORATED)     Status: None   Collection Time    05/06/12  2:34 AM      Result Value Range Status   Specimen Description TRACHEAL ASPIRATE   Final   Special Requests NONE   Final   Gram Stain     Final   Value: FEW WBC PRESENT, PREDOMINANTLY PMN     FEW SQUAMOUS EPITHELIAL CELLS PRESENT     RARE GRAM POSITIVE COCCI     IN PAIRS   Culture PENDING   Incomplete   Report Status PENDING   Incomplete    Medical  History: Past Medical History  Diagnosis Date  . Hypertension   . Hyperlipidemia   . Type 2 diabetes mellitus     Medications:  Scheduled:  . ipratropium  0.5 mg Nebulization Q4H   And  . albuterol  2.5 mg Nebulization Q4H  . [COMPLETED] amiodarone  150 mg Intravenous Once  . [COMPLETED] amiodarone  150 mg Intravenous Once  . antiseptic oral rinse  1 application Mouth Rinse QID  . aspirin  325 mg Oral Daily  . chlorhexidine  15 mL Mouth/Throat BID  . [COMPLETED] digoxin  0.5 mg Intravenous Once  . furosemide  40 mg Intravenous TID  . [COMPLETED] heparin  3,000 Units Intravenous Once  . insulin aspart  2-6 Units Subcutaneous Q4H  . [COMPLETED] levofloxacin (LEVAQUIN) IV  750 mg Intravenous Once  . [START ON 05/08/2012] levofloxacin (LEVAQUIN) IV  750 mg Intravenous Q48H  . pantoprazole (PROTONIX) IV  40 mg Intravenous Q24H  . [DISCONTINUED] albuterol-ipratropium  6 puff Inhalation Q4H  . [DISCONTINUED] cefTRIAXone (ROCEPHIN)  IV  1 g Intravenous QHS  . [DISCONTINUED] furosemide  40 mg Intravenous Q12H  . [DISCONTINUED] heparin subcutaneous  5,000 Units Subcutaneous Q8H   Infusions:  .  sodium chloride 20 mL/hr at 05/06/12 1900  . amiodarone (NEXTERONE PREMIX) 360 mg/200 mL dextrose 0.999 mg/min (05/06/12 1800)   Followed by  . amiodarone (NEXTERONE PREMIX) 360 mg/200 mL dextrose    . fentaNYL infusion INTRAVENOUS 25 mcg/hr (05/06/12 1900)  . heparin 950 Units/hr (05/06/12 1900)  . midazolam (VERSED) infusion 2 mg/hr (05/06/12 1900)  . milrinone 0.25 mcg/kg/min (05/06/12 1900)   Assessment: 79 y/o female patient admitted with acute respiratory failure, now with pneumonia requiring broad spectrum antibiotics. Currently receiving levaquin, to add vancomycin.  Goal of Therapy:  Vancomycin trough level 15-20 mcg/ml  Plan:  Vancomycin 1250mg  IV q24h, monitor renal function and f/u c&s. Measure antibiotic drug levels at steady [address] PharmD, [place] Pager  [phone] 05/06/2012,7:11 PM

## 2012-05-06 NOTE — Progress Notes (Signed)
ANTICOAGULATION CONSULT NOTE - Initial Consult  Pharmacy Consult for heparin Indication: atrial fibrillation  Allergies  Allergen Reactions  . Codeine Other (See Comments)    Unknown     Patient Measurements: Height: 5\' 2"  (157.5 cm) Weight: 169 lb 1.5 oz (76.7 kg) IBW/kg (Calculated) : 50.1 Heparin Dosing Weight: 67 kg  Vital Signs: Temp: 97.6 F (36.4 C) (02/11 1948) Temp src: Oral (02/11 1948) BP: 105/69 mmHg (02/11 2200) Pulse Rate: 112 (02/11 2200)  Labs:  Recent Labs  05/06/12 0229 05/06/12 0318 05/06/12 2200  HGB 12.5  --   --   HCT 36.1  --   --   PLT 205  --   --   APTT  --  27  --   LABPROT  --  15.4*  --   INR  --  1.24  --   HEPARINUNFRC  --   --  1.12*  CREATININE 1.10  --   --   TROPONINI <0.30  --   --     Estimated Creatinine Clearance: 35.8 ml/min (by C-G formula based on Cr of 1.1).   Medical History: Past Medical History  Diagnosis Date  . Hypertension   . Hyperlipidemia   . Type 2 diabetes mellitus    Assessment: Patient is an 79 y.o F known to pharmacy from Abx consult.  To start heparin therapy for Afib.  Patient received heparin 5000 units SQ this morning at ~6 AM for VTE prophylaxis. First heparin level is supratherapeutic, will adjust.  Goal of Therapy:  Heparin level 0.3-0.7 units/ml Monitor platelets by anticoagulation protocol: Yes   Plan:  Hold heparin x1 hour then resume at 750 units/hr. Check 8 hour level.  Verlene Mayer, PharmD, BCPS Pager [phone] 05/06/2012,10:37 PM

## 2012-05-06 NOTE — Progress Notes (Signed)
ANTICOAGULATION CONSULT NOTE - Initial Consult  Pharmacy Consult for heparin Indication: atrial fibrillation  Allergies  Allergen Reactions  . Codeine Other (See Comments)    Unknown     Patient Measurements: Height: 5\' 2"  (157.5 cm) Weight: 169 lb 1.5 oz (76.7 kg) IBW/kg (Calculated) : 50.1 Heparin Dosing Weight: 67 kg  Vital Signs: Temp: 96.8 F (36 C) (02/11 1154) Temp src: Axillary (02/11 1154) BP: 93/58 mmHg (02/11 1200) Pulse Rate: 118 (02/11 1200)  Labs:  Recent Labs  05/06/12 0229 05/06/12 0318  HGB 12.5  --   HCT 36.1  --   PLT 205  --   APTT  --  27  LABPROT  --  15.4*  INR  --  1.24  CREATININE 1.10  --   TROPONINI <0.30  --     Estimated Creatinine Clearance: 35.8 ml/min (by C-G formula based on Cr of 1.1).   Medical History: Past Medical History  Diagnosis Date  . Hypertension   . Hyperlipidemia   . Type 2 diabetes mellitus    Assessment: Patient is an 79 y.o F known to pharmacy from Abx consult.  To start heparin therapy for Afib.  Patient received heparin 5000 units SQ this morning at ~6 AM for VTE prophylaxis.  Goal of Therapy:  Heparin level 0.3-0.7 units/ml Monitor platelets by anticoagulation protocol: Yes   Plan:  1) heparin 3000 units IV bolus x1, then drip at 950 units/hr 2) check 8 hour heparin level  Reah Justo P 05/06/2012,1:43 PM

## 2012-05-06 NOTE — Procedures (Signed)
Name:  [name] MRN:  [id number] DOB:  Sep 09, 1924  PROCEDURE NOTE  Procedure:  Central venous catheter placement.  Indications:  Need for intravenous access and hemodynamic monitoring.  Consent:  Consent was implied due to the emergency nature of the procedure.  Anesthesia:  A total of 10 mL of 1% Lidocaine was used for local infiltration anesthesia.  Procedure summary:  Appropriate equipment was assembled.  The patient was identified as [name] [name] and safety timeout was performed. The patient was placed in Trendelenburg position.  Sterile technique was used. The patient's left anterior chest wall was prepped using chlorhexidine / alcohol scrub and the field was draped in usual sterile fashion with full body drape. After the adequate anesthesia was achieved, the left subclavian vein was cannulated with the introducer needle without difficulty. A guide wire was advanced through the introducer needle, which was then withdrawn. A small skin incision was made at the point of wire entry, the dilator was inserted over the guide wire and appropriate dilation was obtained. The dilator was removed and triple-lumen catheter was advanced over the guide wire, which was then removed.  All ports were aspirated and flushed with normal saline without difficulty. The catheter was secured into place. Antibiotic patch was placed and sterile dressing was applied. Post-procedure chest x-ray was ordered.  Complications:  No immediate complications were noted.  Hemodynamic parameters and oxygenation remained stable throughout the procedure.  Estimated blood loss:  Less then 5 mL.  Orlean Bradford, M.D. Pulmonary and Critical Care Medicine [facility] Cell: [phone]  05/06/2012, 3:36 AM

## 2012-05-06 NOTE — Progress Notes (Signed)
Utilization Review Completed. 05/06/2012  

## 2012-05-06 NOTE — Progress Notes (Signed)
Decreased fio2 to 80%,sat 100%

## 2012-05-07 ENCOUNTER — Inpatient Hospital Stay (HOSPITAL_COMMUNITY): Payer: Medicare Other

## 2012-05-07 LAB — CARBOXYHEMOGLOBIN
Carboxyhemoglobin: 1.3 % (ref 0.5–1.5)
Methemoglobin: 0.9 % (ref 0.0–1.5)
Total hemoglobin: 11.7 g/dL — ABNORMAL LOW (ref 12.0–16.0)

## 2012-05-07 LAB — CBC
Hemoglobin: 11.6 g/dL — ABNORMAL LOW (ref 12.0–15.0)
MCH: 31.2 pg (ref 26.0–34.0)
MCV: 90.3 fL (ref 78.0–100.0)
Platelets: 182 10*3/uL (ref 150–400)
RBC: 3.72 MIL/uL — ABNORMAL LOW (ref 3.87–5.11)
WBC: 11.4 10*3/uL — ABNORMAL HIGH (ref 4.0–10.5)

## 2012-05-07 LAB — GLUCOSE, CAPILLARY
Glucose-Capillary: 112 mg/dL — ABNORMAL HIGH (ref 70–99)
Glucose-Capillary: 128 mg/dL — ABNORMAL HIGH (ref 70–99)

## 2012-05-07 LAB — BASIC METABOLIC PANEL
BUN: 46 mg/dL — ABNORMAL HIGH (ref 6–23)
CO2: 26 mEq/L (ref 19–32)
Chloride: 101 mEq/L (ref 96–112)
Creatinine, Ser: 1.21 mg/dL — ABNORMAL HIGH (ref 0.50–1.10)
GFR calc Af Amer: 46 mL/min — ABNORMAL LOW (ref >90)
Potassium: 3.4 mEq/L — ABNORMAL LOW (ref 3.5–5.1)

## 2012-05-07 LAB — HEPARIN LEVEL (UNFRACTIONATED): Heparin Unfractionated: 0.42 IU/mL (ref 0.30–0.70)

## 2012-05-07 LAB — TSH: TSH: 1.301 u[IU]/mL (ref 0.350–4.500)

## 2012-05-07 MED ORDER — FENTANYL CITRATE 0.05 MG/ML IJ SOLN
25.0000 ug | INTRAMUSCULAR | Status: DC | PRN
  Administered 2012-05-07: 25 ug via INTRAVENOUS
  Filled 2012-05-07: qty 2

## 2012-05-07 NOTE — Progress Notes (Signed)
ANTICOAGULATION CONSULT NOTE - Follow Up Consult    HL = 0.42 (goal 0.3 - 0.7 units/mL) Heparin weight = 67 kg   Assessment: 87 YOF continues on IV heparin for new Afib with RVR.  Heparin level therapeutic post rate decreased.  No bleeding reported.  Patient now extubated.   Plan: - Continue heparin gtt at 650 units/hr - F/U AM labs - F/U with long-term anticoagulation plans     Tamika Shropshire D. Laney Potash, PharmD, BCPS Pager:  [phone] 05/07/2012, 6:50 PM

## 2012-05-07 NOTE — Procedures (Signed)
Extubation Procedure Note  Patient Details:   Name: [name] [name] DOB: 1924/07/13 MRN: [id number]   Airway Documentation:     Evaluation  O2 sats: stable throughout Complications: No apparent complications Patient did tolerate procedure well. Bilateral Breath Sounds: Rhonchi Suctioning: Airway Yes  Order to extubate. Pt positive for cuff leak, extubated to 5lpm [place]. No strider or dypsnea after extubation. Pt achieved around on IS. Pt resting comfortably at this time. Vitals are within normal limits. RT will continue to monitor.   Beatris Si 05/07/2012, 9:25 AM

## 2012-05-07 NOTE — Progress Notes (Signed)
Patient ID: Jersi Mcmaster, female   DOB: 1924-08-29, 79 y.o.   MRN: [id number]    Subjective:  Intubated and sedated  Objective:  Filed Vitals:   05/07/12 0600 05/07/12 0700 05/07/12 0746 05/07/12 0800  BP: 108/48 111/51 111/51 131/67  Pulse: 113 115 118 110  Temp:    98.1 F (36.7 C)  TempSrc:    Oral  Resp: 18 14 18 15   Height:      Weight:      SpO2: 100% 98% 98% 97%    Intake/Output from previous day:  Intake/Output Summary (Last 24 hours) at 05/07/12 0827 Last data filed at 05/07/12 0800  Gross per 24 hour  Intake 1626.65 ml  Output   2565 ml  Net -938.35 ml    Physical Exam: Affect appropriate Healthy:  appears stated age HEENT: normal Neck supple with no adenopathy JVP normal no bruits no thyromegaly Lungs clear with no wheezing and good diaphragmatic motion Heart:  S1/S2 MR murmur, no rub, gallop or click PMI normal Abdomen: benighn, BS positve, no tenderness, no AAA no bruit.  No HSM or HJR Distal pulses intact with no bruits No edema Neuro non-focal Skin warm and dry No muscular weakness   Lab Results: Basic Metabolic Panel:  Recent Labs  40/98/11 0229 05/07/12 0400  NA 134* 138  K 3.9 3.4*  CL 100 101  CO2 23 26  GLUCOSE 237* 118*  BUN 53* 46*  CREATININE 1.10 1.21*  CALCIUM 8.7 8.7  MG 2.3 1.8  PHOS  --  4.3   Liver Function Tests:  Recent Labs  05/06/12 0229  AST 168*  ALT 219*  ALKPHOS 86  BILITOT 0.7  PROT 5.5*  ALBUMIN 3.1*   No results found for this basename: LIPASE, AMYLASE,  in the last 72 hours CBC:  Recent Labs  05/06/12 0229 05/07/12 0400  WBC 12.2* 11.4*  NEUTROABS 10.1*  --   HGB 12.5 11.6*  HCT 36.1 33.6*  MCV 90.7 90.3  PLT 205 182   Cardiac Enzymes:  Recent Labs  05/06/12 0229  TROPONINI <0.30   Thyroid Function Tests:  Recent Labs  05/06/12 1400  TSH 1.301    Imaging: Dg Chest Port 1 View  05/07/2012  *RADIOLOGY REPORT*  Clinical Data: Evaluate endotracheal tube and line placement.   PORTABLE CHEST - 1 VIEW  Comparison: Chest x-ray 05/06/2012.  Findings: An endotracheal tube is in place with tip 5.0 cm above the carina. There is a left-sided subclavian central venous catheter with tip terminating in the superior cavoatrial junction. A nasogastric tube is seen extending into the stomach, however, the tip of the nasogastric tube extends below the lower margin of the image.  Lung volumes are low.  Extensive bibasilar opacities may reflect areas of atelectasis and/or consolidation with superimposed moderate bilateral pleural effusions (left greater than right). Mild cardiomegaly is unchanged.  Pulmonary venous engorgement, without frank pulmonary edema.  Upper mediastinal contours are within normal limits allowing for slight patient rotation to the right.  Atherosclerosis in the thoracic aorta.  IMPRESSION: 1.  Support apparatus, as above. 2.  Bibasilar atelectasis and/or consolidation with superimposed moderate bilateral pleural effusions. 3.  Atherosclerosis. 4.  Mild cardiomegaly.   Original Report Authenticated By: Trudie Reed, M.D.    Dg Chest Port 1 View  05/06/2012  *RADIOLOGY REPORT*  Clinical Data: Endotracheal tube and central line placement.  PORTABLE CHEST - 1 VIEW  Comparison: Chest radiograph performed 05/05/2012  Findings: The patient's endotracheal tube is seen  ending 3-4 cm above the carina.  The left subclavian line is noted ending just within the right atrium; this could be retracted 1-2 cm.  Small bilateral pleural effusions are again seen, with bibasilar airspace opacification.  This may reflect mild interstitial edema. No pneumothorax is identified.  The cardiomediastinal silhouette is borderline enlarged. Calcification is noted in the aortic arch.  No acute osseous abnormalities are seen.  Degenerative change is noted at both glenohumeral joints.  IMPRESSION:  1.  Endotracheal tube seen ending 3-4 cm above the carina. 2.  Left subclavian line seen ending just within the  right atrium; this could be retracted 1-2 cm. 3.  Small bilateral pleural effusions, with bibasilar airspace opacification; this may reflect mild interstitial edema. 4.  Borderline cardiomegaly.  These results were called by telephone on 05/06/2012 at 02:27 a.m. to [facility] on [id number], who verbally acknowledged these results.   Original Report Authenticated By: Tonia Ghent, M.D.     Cardiac Studies:  ECG:    Telemetry: afib rates 100-115  Echo: EF 60-65%  Mild LAE  Moderate to severe MR  Medications:   . ipratropium  0.5 mg Nebulization Q4H   And  . albuterol  2.5 mg Nebulization Q4H  . antiseptic oral rinse  1 application Mouth Rinse QID  . aspirin  325 mg Oral Daily  . chlorhexidine  15 mL Mouth/Throat BID  . furosemide  40 mg Intravenous TID  . insulin aspart  2-6 Units Subcutaneous Q4H  . [START ON 05/08/2012] levofloxacin (LEVAQUIN) IV  750 mg Intravenous Q48H  . pantoprazole (PROTONIX) IV  40 mg Intravenous Q24H  . vancomycin  1,250 mg Intravenous Q24H     . sodium chloride 20 mL/hr at 05/07/12 0700  . amiodarone (NEXTERONE PREMIX) 360 mg/200 mL dextrose 0.5 mg/min (05/07/12 0700)  . fentaNYL infusion INTRAVENOUS Stopped (05/07/12 0800)  . heparin 750 Units/hr (05/07/12 0700)  . midazolam (VERSED) infusion Stopped (05/07/12 0800)  . milrinone 0.25 mcg/kg/min (05/07/12 0700)    Assessment/Plan:  Afib:  On amiodarone and heparin.  Rate reasonable.  Likely TEE cardioversion next week CHF:  ? Diastolic vs contribution of MR  Right and left heart cath. Friday or early next week When extubated and stable.    Charlton Haws 05/07/2012, 8:27 AM

## 2012-05-07 NOTE — Progress Notes (Signed)
PULMONARY  / CRITICAL CARE MEDICINE  Name: [name] [name] MRN: [id number] DOB: [birth year]    ADMISSION DATE:  05/06/2012 CONSULTATION DATE:  05/06/2012  REFERRING MD :  Transfer from [facility]  CHIEF COMPLAINT:  Acute respiratory failure  BRIEF PATIENT DESCRIPTION:  79 yo presented to [facility] with dyspnea for 5 days, suffered PEA cardiac arrest, was briefly resuscitated with ROSC and transferred to [facility] for farther management. EMD Dr Marchelle Gearing note: 9:09 AM on 05/07/2012: per carelink hx - patient coded after getting 1 dose azithromycin   SIGNIFICANT EVENTS / STUDIES:  2/11  Transferred form [facility] after suffering PEA cardiac arrest.  CT angio (from [facility]) >>> no PE, suspicion for elevated R heart pressure.  LINES / TUBES: Foley 2/11 >>> OETT 2/11 >>> OGT 2/11 >>> R fem TLC 2/11 >>> 2/11 L  TLC 2/11 >>>  CULTURES: 2/11  Blood >>> 2/11  Respiratory >>> GPC >>   ANTIBIOTICS: 2/11  Ceftriaxone >>> 2/11 2/11  Levaquin >>> 2/11  Vanco >>   INTERVAL HISTORY:  VITAL SIGNS: Temp:  [96.8 F (36 C)-98.1 F (36.7 C)] 98.1 F (36.7 C) (02/12 0800) Pulse Rate:  [87-124] 110 (02/12 0800) Resp:  [13-21] 15 (02/12 0800) BP: (90-131)/(48-78) 131/67 mmHg (02/12 0800) SpO2:  [94 %-100 %] 97 % (02/12 0800) FiO2 (%):  [39.5 %-100 %] 39.5 % (02/12 0800) Weight:  [74.8 kg (164 lb 14.5 oz)] 74.8 kg (164 lb 14.5 oz) (02/12 0500)  HEMODYNAMICS: CVP:  [8 mmHg-12 mmHg] 8 mmHg  VENTILATOR SETTINGS: Vent Mode:  [-] PSV;CPAP FiO2 (%):  [39.5 %-100 %] 39.5 % Set Rate:  [14 bmp] 14 bmp Vt Set:  [430 mL] 430 mL PEEP:  [5 cmH20-5.3 cmH20] 5 cmH20 Pressure Support:  [5 cmH20] 5 cmH20 Plateau Pressure:  [14 cmH20-20 cmH20] 14 cmH20  INTAKE / OUTPUT: Intake/Output     02/11 0701 - 02/12 0700 02/12 0701 - 02/13 0700   I.V. (mL/kg) 1280.2 (17.1) 52 (0.7)   NG/GT 120 30   IV Piggyback 250    Total Intake(mL/kg) 1650.2 (22.1) 82 (1.1)   Urine (mL/kg/hr) 2540 (1.4) 125 (0.8)   Emesis/NG output 250  (0.1)    Total Output 2790 125   Net -1139.9 -43          PHYSICAL EXAMINATION: General:  Appears comfortable, mechanically ventilated, synchronous Neuro:  Awake and following commands HEENT:  PERRL, OETT / OGT Cardiovascular:  Irregularly irregular, tachycardic Lungs:  Bilateral diminished air entry, bibasilar rales Abdomen:  Soft, nontender, bowel sounds diminished Musculoskeletal:  Moves all extremities, trace edema Skin:  Intact  LABS:  Recent Labs Lab 05/06/12 0229 05/06/12 0230 05/06/12 0239 05/06/12 0318 05/07/12 0400  HGB 12.5  --   --   --  11.6*  WBC 12.2*  --   --   --  11.4*  PLT 205  --   --   --  182  NA 134*  --   --   --  138  K 3.9  --   --   --  3.4*  CL 100  --   --   --  101  CO2 23  --   --   --  26  GLUCOSE 237*  --   --   --  118*  BUN 53*  --   --   --  46*  CREATININE 1.10  --   --   --  1.21*  CALCIUM 8.7  --   --   --  8.7  MG 2.3  --   --   --  1.8  PHOS  --   --   --   --  4.3  AST 168*  --   --   --   --   ALT 219*  --   --   --   --   ALKPHOS 86  --   --   --   --   BILITOT 0.7  --   --   --   --   PROT 5.5*  --   --   --   --   ALBUMIN 3.1*  --   --   --   --   APTT  --   --   --  27  --   INR  --   --   --  1.24  --   LATICACIDVEN  --   --  1.3  --   --   TROPONINI <0.30  --   --   --   --   PROCALCITON 0.40  --   --   --  0.64  PROBNP 3044.0*  --   --   --   --   PHART  --  7.359  --   --   --   PCO2ART  --  37.7  --   --   --   PO2ART  --  69.4*  --   --   --     Recent Labs Lab 05/06/12 1629 05/06/12 1951 05/06/12 2322 05/07/12 0407 05/07/12 0744  GLUCAP 98 141* 128* 122* 124*    Recent Labs Lab 05/06/12 0229 05/07/12 0400  PROCALCITON 0.40 0.64     CXR:  2/11 >>> Hardware in good position, bilateral effusions / airspace disease  ASSESSMENT / PLAN:  PULMONARY A:  Acute respiratory failure likely secondary to arrest, B effusions. P:   Daily SBT > goal extubate 2/12 Combivent  CARDIOVASCULAR A: PEA  cardiac arrest.  Non-specific ST changes.  Atrial fibrillation with RVR.  Possible diastolic dysfxn, pulmonary hypertension, decompensated over ~ 1 week.  Appreciate cardiology input, milrinone and diuresis added 2/11 ASA Considering TEE + cardioversion Likely will undergo L + R cath once stabilized (reassuring stress test ~ 1 yr ago)  RENAL A:  Acute renal insufficiency, with diuresis and probably decrease CO in setting A Fib P:   Follow CVP Trend BMP Tailor diuresis to renal fxn  GASTROINTESTINAL A:  Congestive hepatopathy. P:   Protonix for GI Px Trend LFTs  HEMATOLOGIC A:  No active issues. P:  Trend CBC Heparin for DVT Px  INFECTIOUS A:  Possible CAP, although CT abdomen show only effusions, no parenchymal infiltrates. Pct reassuring P:   Levaquin Ceftriaxone stopped 2/11 (redundant) Note vanco addedd 2/12 for GPC sputum  ENDOCRINE  A:  DM2.  Hyperglycemia. P:   ICU Glycemic Control Protocol, Phase 1  NEUROLOGIC A:  Acute encephalopathy.  Possible anoxia. P:   Wean to off in anticipation extubation 2/12  TODAY'S SUMMARY: Acute pulmonary edema - Lasix.  PEA arrest - echo, Troponin.  DM - SSI.  Acute respiratory failure - mechanical support.  Empirical abx for CAP > l;ikely d/c soon as clinical picture inconsistent w PNA  I have personally obtained a history, examined the patient, evaluated laboratory and imaging results, formulated the assessment and plan and placed orders.  CRITICAL CARE:  The patient is critically ill with multiple organ systems failure and requires high complexity decision making  for assessment and support, frequent evaluation and titration of therapies, application of advanced monitoring technologies and extensive interpretation of multiple databases. Critical Care Time devoted to patient care services described in this note is 40 minutes.   Levy Pupa, MD, PhD 05/07/2012, 9:09 AM [facility] [phone] or if no answer  [phone]

## 2012-05-07 NOTE — Progress Notes (Signed)
ANTICOAGULATION CONSULT NOTE - Follow Up Consult  Pharmacy Consult for heparin Indication: atrial fibrillation  Allergies  Allergen Reactions  . Codeine Other (See Comments)    Unknown     Patient Measurements: Height: 5\' 2"  (157.5 cm) Weight: 164 lb 14.5 oz (74.8 kg) IBW/kg (Calculated) : 50.1 Heparin Dosing Weight: 67 kg  Vital Signs: Temp: 98.1 F (36.7 C) (02/12 0800) Temp src: Oral (02/12 0800) BP: 131/67 mmHg (02/12 0800) Pulse Rate: 110 (02/12 0800)  Labs:  Recent Labs  05/06/12 0229 05/06/12 0318 05/06/12 2200 05/07/12 0400 05/07/12 0753  HGB 12.5  --   --  11.6*  --   HCT 36.1  --   --  33.6*  --   PLT 205  --   --  182  --   APTT  --  27  --   --   --   LABPROT  --  15.4*  --   --   --   INR  --  1.24  --   --   --   HEPARINUNFRC  --   --  1.12*  --  0.76*  CREATININE 1.10  --   --  1.21*  --   TROPONINI <0.30  --   --   --   --     Estimated Creatinine Clearance: 32.2 ml/min (by C-G formula based on Cr of 1.21).  Assessment: Patient is an 78 y.o F s/p PEA prior to transfer to [facility].  Now on heparin therapy for new afib with RVR.  Heparin level remains elevated at 0.76 despite rate decreased to 750 units/hr.  No bleeding noted per RN.   Goal of Therapy:  Heparin level 0.3-0.7 units/ml Monitor platelets by anticoagulation protocol: Yes   Plan:  1) decrease heparin to 650 units/hr 2) check 8 hour heparin hr     Asianae Minkler P 05/07/2012,9:21 AM

## 2012-05-07 NOTE — Progress Notes (Addendum)
25mL IV Versed and IV Fentanyl wasted in sink and flushed, Witnessed by Madaline Savage, RN.

## 2012-05-08 ENCOUNTER — Encounter (HOSPITAL_COMMUNITY): Payer: Self-pay | Admitting: *Deleted

## 2012-05-08 ENCOUNTER — Inpatient Hospital Stay (HOSPITAL_COMMUNITY): Payer: Medicare Other

## 2012-05-08 DIAGNOSIS — I059 Rheumatic mitral valve disease, unspecified: Secondary | ICD-10-CM

## 2012-05-08 LAB — TROPONIN I: Troponin I: <0.3 ng/mL (ref <0.30)

## 2012-05-08 LAB — GLUCOSE, CAPILLARY
Glucose-Capillary: 146 mg/dL — ABNORMAL HIGH (ref 70–99)
Glucose-Capillary: 158 mg/dL — ABNORMAL HIGH (ref 70–99)
Glucose-Capillary: 261 mg/dL — ABNORMAL HIGH (ref 70–99)
Glucose-Capillary: 218 mg/dL — ABNORMAL HIGH (ref 70–99)

## 2012-05-08 LAB — BASIC METABOLIC PANEL
Calcium: 9.4 mg/dL (ref 8.4–10.5)
Creatinine, Ser: 1.16 mg/dL — ABNORMAL HIGH (ref 0.50–1.10)
GFR calc Af Amer: 48 mL/min — ABNORMAL LOW (ref >90)
GFR calc non Af Amer: 42 mL/min — ABNORMAL LOW (ref >90)

## 2012-05-08 LAB — HEPARIN LEVEL (UNFRACTIONATED): Heparin Unfractionated: 0.54 IU/mL (ref 0.30–0.70)

## 2012-05-08 LAB — CARBOXYHEMOGLOBIN
Carboxyhemoglobin: 1.1 % (ref 0.5–1.5)
O2 Saturation: 58.5 %

## 2012-05-08 LAB — CBC
MCH: 31.2 pg (ref 26.0–34.0)
MCHC: 34.7 g/dL (ref 30.0–36.0)
Platelets: 204 10*3/uL (ref 150–400)
RBC: 4.01 MIL/uL (ref 3.87–5.11)

## 2012-05-08 LAB — CULTURE, RESPIRATORY W GRAM STAIN

## 2012-05-08 LAB — MAGNESIUM: Magnesium: 1.8 mg/dL (ref 1.5–2.5)

## 2012-05-08 MED ORDER — MAGNESIUM SULFATE 50 % IJ SOLN: 1.0000 g | Freq: Once | INTRAVENOUS | Status: DC

## 2012-05-08 MED ORDER — MAGNESIUM SULFATE IN D5W 10-5 MG/ML-% IV SOLN
1.0000 g | Freq: Once | INTRAVENOUS | Status: AC
  Administered 2012-05-08: 1 g via INTRAVENOUS
  Filled 2012-05-08 (×2): qty 100

## 2012-05-08 MED ORDER — INSULIN ASPART 100 UNIT/ML ~~LOC~~ SOLN
0.0000 [IU] | Freq: Every day | SUBCUTANEOUS | Status: DC
  Administered 2012-05-08: 2 [IU] via SUBCUTANEOUS
  Administered 2012-05-09: 21:00:00 via SUBCUTANEOUS
  Administered 2012-05-13 – 2012-05-19 (×3): 2 [IU] via SUBCUTANEOUS

## 2012-05-08 MED ORDER — INSULIN ASPART 100 UNIT/ML ~~LOC~~ SOLN
0.0000 [IU] | Freq: Three times a day (TID) | SUBCUTANEOUS | Status: DC
  Administered 2012-05-08: 8 [IU] via SUBCUTANEOUS

## 2012-05-08 MED ORDER — PANTOPRAZOLE SODIUM 40 MG PO TBEC
40.0000 mg | DELAYED_RELEASE_TABLET | Freq: Every day | ORAL | Status: DC
  Administered 2012-05-10 – 2012-05-19 (×10): 40 mg via ORAL
  Filled 2012-05-08 (×9): qty 1

## 2012-05-08 MED ORDER — FUROSEMIDE 10 MG/ML IJ SOLN
40.0000 mg | Freq: Every day | INTRAMUSCULAR | Status: DC
  Filled 2012-05-08: qty 4

## 2012-05-08 MED ORDER — INSULIN GLARGINE 100 UNIT/ML ~~LOC~~ SOLN
18.0000 [IU] | Freq: Every day | SUBCUTANEOUS | Status: DC
  Administered 2012-05-08 – 2012-05-19 (×11): 18 [IU] via SUBCUTANEOUS

## 2012-05-08 MED ORDER — POTASSIUM CHLORIDE 10 MEQ/50ML IV SOLN
10.0000 meq | INTRAVENOUS | Status: AC
  Administered 2012-05-08 (×2): 10 meq via INTRAVENOUS

## 2012-05-08 MED ORDER — ROPINIROLE HCL 0.5 MG PO TABS
0.5000 mg | ORAL_TABLET | Freq: Every day | ORAL | Status: DC
  Administered 2012-05-08 – 2012-05-11 (×4): 0.5 mg via ORAL
  Filled 2012-05-08 (×5): qty 1

## 2012-05-08 MED ORDER — ALBUTEROL SULFATE (5 MG/ML) 0.5% IN NEBU: 2.5000 mg | INHALATION_SOLUTION | RESPIRATORY_TRACT | Status: DC | PRN

## 2012-05-08 MED ORDER — POTASSIUM CHLORIDE 10 MEQ/50ML IV SOLN
10.0000 meq | INTRAVENOUS | Status: DC
  Administered 2012-05-08 (×2): 10 meq via INTRAVENOUS
  Filled 2012-05-08: qty 200

## 2012-05-08 MED ORDER — METOPROLOL TARTRATE 25 MG PO TABS
25.0000 mg | ORAL_TABLET | Freq: Two times a day (BID) | ORAL | Status: DC
  Administered 2012-05-08 – 2012-05-19 (×23): 25 mg via ORAL
  Filled 2012-05-08 (×27): qty 1

## 2012-05-08 MED ORDER — INSULIN ASPART 100 UNIT/ML ~~LOC~~ SOLN
0.0000 [IU] | Freq: Three times a day (TID) | SUBCUTANEOUS | Status: DC
  Administered 2012-05-08: 8 [IU] via SUBCUTANEOUS
  Administered 2012-05-09 – 2012-05-10 (×2): 3 [IU] via SUBCUTANEOUS
  Administered 2012-05-10: 5 [IU] via SUBCUTANEOUS
  Administered 2012-05-10 – 2012-05-11 (×2): 2 [IU] via SUBCUTANEOUS
  Administered 2012-05-11: 8 [IU] via SUBCUTANEOUS
  Administered 2012-05-11: 2 [IU] via SUBCUTANEOUS
  Administered 2012-05-12: 8 [IU] via SUBCUTANEOUS
  Administered 2012-05-12: 2 [IU] via SUBCUTANEOUS
  Administered 2012-05-13: 5 [IU] via SUBCUTANEOUS
  Administered 2012-05-13: 2 [IU] via SUBCUTANEOUS
  Administered 2012-05-13 – 2012-05-14 (×2): 3 [IU] via SUBCUTANEOUS
  Administered 2012-05-14 (×2): 2 [IU] via SUBCUTANEOUS
  Administered 2012-05-15 (×2): 3 [IU] via SUBCUTANEOUS
  Administered 2012-05-16: 2 [IU] via SUBCUTANEOUS
  Administered 2012-05-16: 3 [IU] via SUBCUTANEOUS
  Administered 2012-05-16 – 2012-05-17 (×2): 2 [IU] via SUBCUTANEOUS
  Administered 2012-05-17: 5 [IU] via SUBCUTANEOUS
  Administered 2012-05-17 – 2012-05-18 (×3): 3 [IU] via SUBCUTANEOUS
  Administered 2012-05-19 (×2): 2 [IU] via SUBCUTANEOUS
  Administered 2012-05-19: 5 [IU] via SUBCUTANEOUS

## 2012-05-08 MED ORDER — BIOTENE DRY MOUTH MT LIQD
1.0000 "application " | Freq: Two times a day (BID) | OROMUCOSAL | Status: DC
  Administered 2012-05-08 – 2012-05-19 (×21): 15 mL via OROMUCOSAL

## 2012-05-08 NOTE — Progress Notes (Signed)
Patient ID: [name] [name], female   DOB: 1925/02/11, 79 y.o.   MRN: [id number]    Subjective:  No complaints extubated    Objective:  Filed Vitals:   05/08/12 0700 05/08/12 0734 05/08/12 0800 05/08/12 0818  BP: 118/64  126/75   Pulse: 111  119   Temp:    97.5 F (36.4 C)  TempSrc:    Oral  Resp: 21  21   Height:      Weight:   153 lb (69.4 kg)   SpO2: 95% 97% 96%     Intake/Output from previous day:  Intake/Output Summary (Last 24 hours) at 05/08/12 0900 Last data filed at 05/08/12 7829  Gross per 24 hour  Intake 1497.5 ml  Output   4485 ml  Net -2987.5 ml    Physical Exam: Affect appropriate Healthy:  appears stated age HEENT: normal Neck supple with no adenopathy JVP normal no bruits no thyromegaly Lungs clear with no wheezing and good diaphragmatic motion Heart:  S1/S2 MR murmur, no rub, gallop or click PMI normal Abdomen: benighn, BS positve, no tenderness, no AAA no bruit.  No HSM or HJR Distal pulses intact with no bruits No edema Neuro non-focal Skin warm and dry No muscular weakness   Lab Results: Basic Metabolic Panel:  Recent Labs  56/21/30 0400 05/08/12 0330  NA 138 140  K 3.4* 3.2*  CL 101 99  CO2 26 29  GLUCOSE 118* 138*  BUN 46* 38*  CREATININE 1.21* 1.16*  CALCIUM 8.7 9.4  MG 1.8 1.8  PHOS 4.3 3.9   Liver Function Tests:  Recent Labs  05/06/12 0229  AST 168*  ALT 219*  ALKPHOS 86  BILITOT 0.7  PROT 5.5*  ALBUMIN 3.1*   CBC:  Recent Labs  05/06/12 0229 05/07/12 0400 05/08/12 0330  WBC 12.2* 11.4* 11.0*  NEUTROABS 10.1*  --   --   HGB 12.5 11.6* 12.5  HCT 36.1 33.6* 36.0  MCV 90.7 90.3 89.8  PLT 205 182 204   Cardiac Enzymes:  Recent Labs  05/06/12 0229  TROPONINI <0.30   Thyroid Function Tests:  Recent Labs  05/06/12 1400  TSH 1.301    Imaging: Dg Chest Port 1 View  05/07/2012  *RADIOLOGY REPORT*  Clinical Data: Evaluate endotracheal tube and line placement.  PORTABLE CHEST - 1 VIEW  Comparison:  Chest x-ray 05/06/2012.  Findings: An endotracheal tube is in place with tip 5.0 cm above the carina. There is a left-sided subclavian central venous catheter with tip terminating in the superior cavoatrial junction. A nasogastric tube is seen extending into the stomach, however, the tip of the nasogastric tube extends below the lower margin of the image.  Lung volumes are low.  Extensive bibasilar opacities may reflect areas of atelectasis and/or consolidation with superimposed moderate bilateral pleural effusions (left greater than right). Mild cardiomegaly is unchanged.  Pulmonary venous engorgement, without frank pulmonary edema.  Upper mediastinal contours are within normal limits allowing for slight patient rotation to the right.  Atherosclerosis in the thoracic aorta.  IMPRESSION: 1.  Support apparatus, as above. 2.  Bibasilar atelectasis and/or consolidation with superimposed moderate bilateral pleural effusions. 3.  Atherosclerosis. 4.  Mild cardiomegaly.   Original Report Authenticated By: Trudie Reed, M.D.     Cardiac Studies:  ECG: 05/06/12  Rapid afib rate 117  Nonspecific ST/T wave changes   Telemetry: afib rates 100-115  05/08/2012   Echo: EF 60-65%  Mild LAE  Moderate to severe MR  Medications:   .  antiseptic oral rinse  1 application Mouth Rinse QID  . aspirin  325 mg Oral Daily  . chlorhexidine  15 mL Mouth/Throat BID  . furosemide  40 mg Intravenous TID  . insulin aspart  0-15 Units Subcutaneous TID WC  . insulin aspart  0-5 Units Subcutaneous QHS  . insulin glargine  18 Units Subcutaneous QHS  . magnesium sulfate 1 - 4 g bolus IVPB  1 g Intravenous Once  . pantoprazole  40 mg Oral Daily  . potassium chloride  10 mEq Intravenous Q1 Hr x 4  . rOPINIRole  0.5 mg Oral QHS     . sodium chloride 20 mL/hr at 05/07/12 2300  . amiodarone (NEXTERONE PREMIX) 360 mg/200 mL dextrose 0.5 mg/min (05/08/12 0800)  . heparin 6.5 Units (05/08/12 0800)  . milrinone 0.252 mcg/kg/min  (05/08/12 0800)    Assessment/Plan:  Afib:  On amiodarone and heparin.  Rate reasonable.  Add low dose beta blocker today CHF:  ? Diastolic vs contribution of MR  Right and left heart cath. Monday  Decrease lasix With prerenal azotemia recheck Echo and CXR     Charlton Haws 05/08/2012, 9:00 AM

## 2012-05-08 NOTE — Progress Notes (Signed)
Report called to RN and patient transferred to [id number] and attached to tele monitor with no complications.

## 2012-05-08 NOTE — Progress Notes (Signed)
PULMONARY  / CRITICAL CARE MEDICINE  Name: [name] [name] MRN: [id number] DOB: 10/12/1924    ADMISSION DATE:  05/06/2012 CONSULTATION DATE:  05/06/2012  REFERRING MD :  Transfer from [facility]  CHIEF COMPLAINT:  Acute respiratory failure  BRIEF PATIENT DESCRIPTION:  79 yo presented to [facility] with dyspnea for 5 days, suffered PEA cardiac arrest, was briefly resuscitated with ROSC and transferred to [facility] for farther management. EMD Dr Marchelle Gearing note: 8:40 AM on 05/08/2012: per carelink hx - patient coded after getting 1 dose azithromycin and diltiazem for rate control   SIGNIFICANT EVENTS / STUDIES:  2/11  Transferred form [facility] after suffering PEA cardiac arrest.  CT angio (from [facility]) >>> no PE, suspicion for elevated R heart pressure.  LINES / TUBES: Foley 2/11 >>> OETT 2/11 >>> 2/12 OGT 2/11 >>> 2/12 R fem TLC 2/11 >>> 2/11 L [place]oard TLC 2/11 >>>  CULTURES: 2/11  Blood >>> 2/11  Respiratory >>> GPC >> normal flora  ANTIBIOTICS: 2/11  Ceftriaxone >>> 2/11 2/11  Levaquin >>> 2/13  2/11  Vanco >> 2/13  INTERVAL HISTORY:  VITAL SIGNS: Temp:  [97.3 F (36.3 C)-98.1 F (36.7 C)] 97.5 F (36.4 C) (02/13 0818) Pulse Rate:  [111-128] 119 (02/13 0800) Resp:  [11-29] 21 (02/13 0800) BP: (94-133)/(43-75) 126/75 mmHg (02/13 0800) SpO2:  [90 %-100 %] 96 % (02/13 0800) FiO2 (%):  [39.6 %] 39.6 % (02/12 0900) Weight:  [69.4 kg (153 lb)] 69.4 kg (153 lb) (02/13 0800)  HEMODYNAMICS: CVP:  [3 mmHg-5 mmHg] 5 mmHg  VENTILATOR SETTINGS: Vent Mode:  [-]  FiO2 (%):  [39.6 %] 39.6 %  INTAKE / OUTPUT: Intake/Output     02/12 0701 - 02/13 0700 02/13 0701 - 02/14 0700   I.V. (mL/kg) 1074.1 (14.4) 75.4 (1.1)   NG/GT 30    IV Piggyback 400 50   Total Intake(mL/kg) 1504.1 (20.1) 125.4 (1.8)   Urine (mL/kg/hr) 4560 (2.5) 50 (0.4)   Emesis/NG output 50 (0)    Total Output 4610 50   Net -3105.9 +75.4          PHYSICAL EXAMINATION: General:  Appears comfortable,  Neuro:  Awake and following  commands HEENT:  PERRL, OP clear Cardiovascular:  Irregularly irregular, tachycardic Lungs:  Bilateral diminished air entry, clear Abdomen:  Soft, nontender, bowel sounds diminished Musculoskeletal:  Moves all extremities, trace edema Skin:  Intact  LABS:  Recent Labs Lab 05/06/12 0229 05/06/12 0230 05/06/12 0239 05/06/12 0318 05/07/12 0400 05/08/12 0330  HGB 12.5  --   --   --  11.6* 12.5  WBC 12.2*  --   --   --  11.4* 11.0*  PLT 205  --   --   --  182 204  NA 134*  --   --   --  138 140  K 3.9  --   --   --  3.4* 3.2*  CL 100  --   --   --  101 99  CO2 23  --   --   --  26 29  GLUCOSE 237*  --   --   --  118* 138*  BUN 53*  --   --   --  46* 38*  CREATININE 1.10  --   --   --  1.21* 1.16*  CALCIUM 8.7  --   --   --  8.7 9.4  MG 2.3  --   --   --  1.8 1.8  PHOS  --   --   --   --  4.3 3.9  AST 168*  --   --   --   --   --   ALT 219*  --   --   --   --   --   ALKPHOS 86  --   --   --   --   --   BILITOT 0.7  --   --   --   --   --   PROT 5.5*  --   --   --   --   --   ALBUMIN 3.1*  --   --   --   --   --   APTT  --   --   --  27  --   --   INR  --   --   --  1.24  --   --   LATICACIDVEN  --   --  1.3  --   --   --   TROPONINI <0.30  --   --   --   --   --   PROCALCITON 0.40  --   --   --  0.64 0.28  PROBNP 3044.0*  --   --   --   --   --   PHART  --  7.359  --   --   --   --   PCO2ART  --  37.7  --   --   --   --   PO2ART  --  69.4*  --   --   --   --     Recent Labs Lab 05/07/12 1553 05/07/12 1937 05/07/12 2231 05/08/12 0354 05/08/12 0816  GLUCAP 144* 160* 128* 146* 158*    Recent Labs Lab 05/06/12 0229 05/07/12 0400 05/08/12 0330  PROCALCITON 0.40 0.64 0.28    CXR:  2/12 >>> Hardware in good position, bilateral effusions / airspace disease  ASSESSMENT / PLAN:  PULMONARY A:  Acute respiratory failure likely secondary to arrest, B effusions. P:   Change BD's to prn pulm toilet, mobilize  CARDIOVASCULAR A: PEA cardiac arrest.  Non-specific  ST changes.  Atrial fibrillation with RVR.  Possible diastolic dysfxn, pulmonary hypertension, decompensated over ~ 1 week.  Appreciate cardiology input, milrinone and diuresis added 2/11 ASA Considering TEE + cardioversion Likely will undergo L + R cath once stabilized (reassuring stress test ~ 1 yr ago) Will keep in ICU as long as on milrinone; ? Consider transfer to 2900  RENAL A:  Acute renal insufficiency, with diuresis and probably decrease CO in setting A Fib P:   Follow CVP Trend BMP Tailor diuresis to renal fxn  GASTROINTESTINAL A:  Congestive hepatopathy. P:   Protonix for GI Px Trend LFTs  HEMATOLOGIC A:  No active issues. P:  Trend CBC Heparin for DVT Px  INFECTIOUS A:  Possible CAP, although CT abdomen show only effusions, no parenchymal infiltrates. Pct reassuring. resp >> normal flora P:   D/c vanco and levaquin 2/13  ENDOCRINE  A:  DM2.  Hyperglycemia. P:   D/c ICU HG protocol Restart her home lantus 18u Hold her home glucophage for now  NEUROLOGIC A:  Acute encephalopathy.  Resolved  P:     TODAY'S SUMMARY:  New A fib, bilateral effusions and dyspnea; acute arrest after diltiazem at OSH. Extubated 2/12. Cardiac w/u and diuresis underway. In ICU due to milrinone gtt  I have personally obtained a history, examined the patient, evaluated laboratory and imaging results, formulated the assessment and plan and  placed orders.   Levy Pupa, MD, PhD 05/08/2012, 8:40 AM [facility] [phone] or if no answer [phone]

## 2012-05-08 NOTE — Progress Notes (Signed)
   Patient seen and examined. Case discussed with Dr. Eden Emms.  She is much improved. CVP down to 6-7. HR down to 115. Was able to walk today.   I suspect major issue was AF with RVR and not ischemia. Her initial troponin was negative despite AF with RVR at 160 and respiratory failure - thus I think she passed her stress test. Will repeat troponin if remains negative or just mildly elevated I think we can defer cath in favor of TEE/DC-CV.   Agree with cutting back lasix. Will also cut milrinone down to 0.125. Continue to follow co-ox. Agree with repeat echo to reassess MR now that filling pressure are much improved and afterload has been reduced.   Can transfer to stepdown. OK to put on my service.   Daniel Bensimhon,MD 11:28 AM

## 2012-05-08 NOTE — Progress Notes (Signed)
eLink Physician-Brief Progress Note Patient Name: Naamah Boggess DOB: [birth year] MRN: [id number]  Date of Service  05/08/2012   HPI/Events of Note   RESTLESS LEGS PER RN  eICU Interventions  HOME REQUIP REORDERED      Chastin Riesgo 05/08/2012, 3:54 AM

## 2012-05-08 NOTE — Evaluation (Addendum)
Physical Therapy Evaluation Patient Details Name: [name] [name] MRN: [id number] DOB: [birth year] Today's Date: 05/08/2012 Time: 4098-1191 PT Time Calculation (min): 44 min  PT Assessment / Plan / Recommendation Clinical Impression  Pt adm after cardiac arrest.  Pt likely for heart cath next week.  Pt lives alone with family available intermittently.  Feel pt can probably return home with HHPT if no significant medical changes.    PT Assessment  Patient needs continued PT services    Follow Up Recommendations  Home health PT    Does the patient have the potential to tolerate intense rehabilitation      Barriers to Discharge        Equipment Recommendations  None recommended by PT    Recommendations for Other Services OT consult   Frequency Min 3X/week    Precautions / Restrictions Precautions Precautions: Fall   Pertinent Vitals/Pain HR 120's at rest and with amb.      Mobility  Transfers Transfers: Sit to Stand;Stand to Sit Sit to Stand: 4: Min assist;With upper extremity assist;With armrests;From chair/3-in-1 Stand to Sit: 4: Min guard;With upper extremity assist;With armrests;To chair/3-in-1 Details for Transfer Assistance: Slight assist to lift hips. Ambulation/Gait Ambulation/Gait Assistance: 4: Min guard Ambulation Distance (Feet): 150 Feet Assistive device: Rolling walker Gait Pattern: Step-through pattern;Decreased stride length Gait velocity: decr    Exercises     PT Diagnosis: Difficulty walking;Generalized weakness  PT Problem List: Decreased strength;Decreased activity tolerance;Decreased balance;Decreased mobility PT Treatment Interventions: DME instruction;Gait training;Patient/family education;Functional mobility training;Therapeutic activities;Therapeutic exercise;Balance training   PT Goals Acute Rehab PT Goals PT Goal Formulation: With patient Time For Goal Achievement: 05/15/12 Potential to Achieve Goals: Good Pt will go Supine/Side to Sit:  with modified independence PT Goal: Supine/Side to Sit - Progress: Goal set today Pt will go Sit to Supine/Side: with modified independence PT Goal: Sit to Supine/Side - Progress: Goal set today Pt will go Sit to Stand: with modified independence PT Goal: Sit to Stand - Progress: Goal set today Pt will go Stand to Sit: with modified independence PT Goal: Stand to Sit - Progress: Goal set today Pt will Ambulate: 51 - 150 feet;with modified independence;with least restrictive assistive device PT Goal: Ambulate - Progress: Goal set today  Visit Information  Last PT Received On: 05/08/12 Assistance Needed: +1    Subjective Data  Subjective: "It feels good to be out of the bed." Patient Stated Goal: Return home   Prior Functioning  Home Living Lives With: Alone Available Help at Discharge: Family;Available PRN/intermittently Type of Home: House Home Access: Ramped entrance Home Layout: One level Bathroom Shower/Tub: Walk-in shower Home Adaptive Equipment: Bedside commode/3-in-1;Built-in shower seat;Grab bars in shower;Grab bars around toilet;Straight cane;Walker - rolling;Wheelchair - manual Prior Function Level of Independence: Needs assistance Needs Assistance: Light Housekeeping Light Housekeeping: Maximal Driving: Yes Vocation: Retired Comments: Uses cane with amb.  Only showers if family member present in home. Communication Communication: No difficulties    Cognition  Cognition Overall Cognitive Status: Appears within functional limits for tasks assessed/performed Arousal/Alertness: Awake/alert Orientation Level: Appears intact for tasks assessed Behavior During Session: [facility] for tasks performed    Extremity/Trunk Assessment Right Lower Extremity Assessment RLE ROM/Strength/Tone: Deficits RLE ROM/Strength/Tone Deficits: grossly 4/5 Left Lower Extremity Assessment LLE ROM/Strength/Tone: Deficits LLE ROM/Strength/Tone Deficits: grossly 4/5   Balance Balance Balance  Assessed: Yes Static Standing Balance Static Standing - Balance Support: Bilateral upper extremity supported (on walker) Static Standing - Level of Assistance: 5: Stand by assistance  End of Session PT -  End of Session Equipment Utilized During Treatment: Gait belt;Oxygen Activity Tolerance: Patient tolerated treatment well Patient left: in chair;with call bell/phone within reach Nurse Communication: Mobility status  GP     [facility] 05/08/2012, 11:08 AM  Skip Mayer PT [phone]

## 2012-05-08 NOTE — Progress Notes (Signed)
ANTICOAGULATION CONSULT NOTE - Follow Up Consult  Pharmacy Consult for heparin Indication: atrial fibrillation  Allergies  Allergen Reactions  . Codeine Other (See Comments)    Unknown     Patient Measurements: Height: 5\' 2"  (157.5 cm) Weight: 164 lb 14.5 oz (74.8 kg) IBW/kg (Calculated) : 50.1 Heparin Dosing Weight: 67 kg  Vital Signs: Temp: 97.7 F (36.5 C) (02/13 0355) Temp src: Oral (02/13 0355) BP: 118/64 mmHg (02/13 0700) Pulse Rate: 111 (02/13 0700)  Labs:  Recent Labs  05/06/12 0229 05/06/12 0318  05/07/12 0400 05/07/12 0753 05/07/12 1800 05/08/12 0330  HGB 12.5  --   --  11.6*  --   --  12.5  HCT 36.1  --   --  33.6*  --   --  36.0  PLT 205  --   --  182  --   --  204  APTT  --  27  --   --   --   --   --   LABPROT  --  15.4*  --   --   --   --   --   INR  --  1.24  --   --   --   --   --   HEPARINUNFRC  --   --   < >  --  0.76* 0.42 0.54  CREATININE 1.10  --   --  1.21*  --   --  1.16*  TROPONINI <0.30  --   --   --   --   --   --   < > = values in this interval not displayed.  Estimated Creatinine Clearance: 33.6 ml/min (by C-G formula based on Cr of 1.16).  Assessment: Patient is an 79 y.o F s/p PEA prior to transfer to [facility].  Now on heparin therapy for new afib with RVR.  Heparin level remains at goal 0.54 on 650 units/hr. No bleeding noted per RN.   Goal of Therapy:  Heparin level 0.3-0.7 units/ml Monitor platelets by anticoagulation protocol: Yes   Plan:  1) Continue heparin at 650 units/hr 2) Daily heparin level and cbc  Severiano Gilbert 05/08/2012,7:59 AM [phone]

## 2012-05-08 NOTE — Progress Notes (Signed)
  Echocardiogram 2D Echocardiogram has been performed.  Jorje Guild 05/08/2012, 4:31 PM

## 2012-05-08 NOTE — Progress Notes (Signed)
eLink Physician Progress Note and Electrolyte Replacement  Patient Name: [name] [name] DOB: [birth year] MRN: [id number]  Date of Service  05/08/2012   HPI/Events of Note    Recent Labs Lab 05/06/12 0229 05/07/12 0400 05/08/12 0330  NA 134* 138 140  K 3.9 3.4* 3.2*  CL 100 101 99  CO2 23 26 29   GLUCOSE 237* 118* 138*  BUN 53* 46* 38*  CREATININE 1.10 1.21* 1.16*  CALCIUM 8.7 8.7 9.4  MG 2.3 1.8 1.8  PHOS  --  4.3 3.9    Estimated Creatinine Clearance: 33.6 ml/min (by C-G formula based on Cr of 1.16).  Intake/Output     02/12 0701 - 02/13 0700   I.V. (mL/kg) 1030.9 (13.8)   NG/GT 30   IV Piggyback 400   Total Intake(mL/kg) 1460.9 (19.5)   Urine (mL/kg/hr) 4500 (2.5)   Emesis/NG output 50 (0)   Total Output 4550   Net -3089.1        - I/O DETAILED x 24h    Total I/O In: 838.4 [I.V.:438.4; IV Piggyback:400] Out: 1550 [Urine:1550] - I/O THIS SHIFT    ASSESSMENT Hypo K, Hypo Mag  eICURN Interventions  KCL 40 IV Mag 1gm IV   ASSESSMENT: MAJOR ELECTROLYTE      Dr. Kalman Shan, M.D., [facility].C.P Pulmonary and Critical Care Medicine Staff Physician [facility] Pager: [phone], If no answer or between  15:00h - 7:00h: call [phone]  05/08/2012 6:22 AM

## 2012-05-09 ENCOUNTER — Encounter (HOSPITAL_COMMUNITY): Payer: Self-pay

## 2012-05-09 ENCOUNTER — Encounter (HOSPITAL_COMMUNITY): Payer: Self-pay | Admitting: *Deleted

## 2012-05-09 ENCOUNTER — Encounter (HOSPITAL_COMMUNITY)
Admission: RE | Disposition: A | Discharge status: Skilled Nursing Facility | Payer: Self-pay | Source: Other Acute Inpatient Hospital | Attending: Internal Medicine

## 2012-05-09 DIAGNOSIS — J90 Pleural effusion, not elsewhere classified: Secondary | ICD-10-CM

## 2012-05-09 DIAGNOSIS — I4891 Unspecified atrial fibrillation: Secondary | ICD-10-CM

## 2012-05-09 HISTORY — PX: TEE WITHOUT CARDIOVERSION: SHX5443

## 2012-05-09 LAB — CARBOXYHEMOGLOBIN
Carboxyhemoglobin: 0.9 % (ref 0.5–1.5)
Methemoglobin: 0.8 % (ref 0.0–1.5)
O2 Saturation: 60.2 %

## 2012-05-09 LAB — CBC
MCH: 30.6 pg (ref 26.0–34.0)
MCHC: 34.3 g/dL (ref 30.0–36.0)
Platelets: 234 10*3/uL (ref 150–400)
RDW: 12.9 % (ref 11.5–15.5)

## 2012-05-09 LAB — GLUCOSE, CAPILLARY
Glucose-Capillary: 152 mg/dL — ABNORMAL HIGH (ref 70–99)
Glucose-Capillary: 314 mg/dL — ABNORMAL HIGH (ref 70–99)

## 2012-05-09 LAB — BASIC METABOLIC PANEL
BUN: 31 mg/dL — ABNORMAL HIGH (ref 6–23)
Calcium: 9.5 mg/dL (ref 8.4–10.5)
GFR calc Af Amer: 59 mL/min — ABNORMAL LOW (ref >90)
GFR calc non Af Amer: 51 mL/min — ABNORMAL LOW (ref >90)
Glucose, Bld: 178 mg/dL — ABNORMAL HIGH (ref 70–99)
Sodium: 138 mEq/L (ref 135–145)

## 2012-05-09 SURGERY — ECHOCARDIOGRAM, TRANSESOPHAGEAL
Anesthesia: Moderate Sedation

## 2012-05-09 MED ORDER — PATIENT'S GUIDE TO USING COUMADIN BOOK
Freq: Once | Status: AC
  Administered 2012-05-09: 17:00:00
  Filled 2012-05-09: qty 1

## 2012-05-09 MED ORDER — MIDAZOLAM HCL 5 MG/ML IJ SOLN
INTRAMUSCULAR | Status: AC
  Filled 2012-05-09: qty 2

## 2012-05-09 MED ORDER — WARFARIN VIDEO
Freq: Once | Status: AC
  Administered 2012-05-09: 17:00:00

## 2012-05-09 MED ORDER — AMIODARONE HCL 200 MG PO TABS
200.0000 mg | ORAL_TABLET | Freq: Two times a day (BID) | ORAL | Status: DC
  Administered 2012-05-09 – 2012-05-19 (×21): 200 mg via ORAL
  Filled 2012-05-09 (×25): qty 1

## 2012-05-09 MED ORDER — WARFARIN SODIUM 5 MG PO TABS
5.0000 mg | ORAL_TABLET | Freq: Once | ORAL | Status: AC
  Administered 2012-05-09: 5 mg via ORAL
  Filled 2012-05-09: qty 1

## 2012-05-09 MED ORDER — MIDAZOLAM HCL 10 MG/2ML IJ SOLN
INTRAMUSCULAR | Status: DC | PRN
  Administered 2012-05-09: 2 mg via INTRAVENOUS
  Administered 2012-05-09: 1 mg via INTRAVENOUS

## 2012-05-09 MED ORDER — POTASSIUM CHLORIDE CRYS ER 20 MEQ PO TBCR: 40.0000 meq | EXTENDED_RELEASE_TABLET | Freq: Once | ORAL | Status: DC

## 2012-05-09 MED ORDER — HEPARIN (PORCINE) IN NACL 100-0.45 UNIT/ML-% IJ SOLN
600.0000 [IU]/h | INTRAMUSCULAR | Status: DC
  Administered 2012-05-10: 650 [IU]/h via INTRAVENOUS
  Filled 2012-05-09 (×3): qty 250

## 2012-05-09 MED ORDER — BUTAMBEN-TETRACAINE-BENZOCAINE 2-2-14 % EX AERO
INHALATION_SPRAY | CUTANEOUS | Status: DC | PRN
  Administered 2012-05-09: 2 via TOPICAL

## 2012-05-09 MED ORDER — APIXABAN 5 MG PO TABS
5.0000 mg | ORAL_TABLET | Freq: Two times a day (BID) | ORAL | Status: DC
  Filled 2012-05-09 (×2): qty 1

## 2012-05-09 MED ORDER — FUROSEMIDE 20 MG PO TABS: 20.0000 mg | ORAL_TABLET | Freq: Every day | ORAL | Status: DC

## 2012-05-09 MED ORDER — FUROSEMIDE 40 MG PO TABS
40.0000 mg | ORAL_TABLET | Freq: Two times a day (BID) | ORAL | Status: DC
  Administered 2012-05-09 – 2012-05-10 (×3): 40 mg via ORAL
  Filled 2012-05-09 (×5): qty 1

## 2012-05-09 MED ORDER — LIDOCAINE VISCOUS 2 % MT SOLN
OROMUCOSAL | Status: AC
  Filled 2012-05-09: qty 15

## 2012-05-09 MED ORDER — FENTANYL CITRATE 0.05 MG/ML IJ SOLN
INTRAMUSCULAR | Status: DC | PRN
  Administered 2012-05-09 (×2): 25 ug via INTRAVENOUS

## 2012-05-09 MED ORDER — SODIUM CHLORIDE 0.9 % IV SOLN: INTRAVENOUS | Status: DC

## 2012-05-09 MED ORDER — WARFARIN - PHARMACIST DOSING INPATIENT: Freq: Every day | Status: DC

## 2012-05-09 MED ORDER — FUROSEMIDE 40 MG PO TABS: 40.0000 mg | ORAL_TABLET | Freq: Once | ORAL | Status: DC

## 2012-05-09 MED ORDER — FENTANYL CITRATE 0.05 MG/ML IJ SOLN
INTRAMUSCULAR | Status: AC
  Filled 2012-05-09: qty 2

## 2012-05-09 NOTE — CV Procedure (Addendum)
                               TRANSESOPHAGEAL ECHOCARDIOGRAM   NAME:  [name] [name]   MRN: [id number] DOB:  23-Nov-1924   ADMIT DATE: 05/06/2012  INDICATIONS: AF with RVR, Mitral regurgitation   PROCEDURE:   Informed consent was obtained prior to the procedure. The risks, benefits and alternatives for the procedure were discussed and the patient comprehended these risks.  Risks include, but are not limited to, cough, sore throat, vomiting, nausea, somnolence, esophageal and stomach trauma or perforation, bleeding, low blood pressure, aspiration, pneumonia, infection, trauma to the teeth and death.    After a procedural time-out, the patient was given 3 mg versed and 50 mcg fentanyl for moderate sedation.  The oropharynx was anesthetized with cetacaine spray.  The transesophageal probe was inserted in the esophagus and stomach without difficulty and multiple views were obtained.     FINDINGS:  LEFT VENTRICLE: EF = 60%. Normal size. No regional wall motion abnormalities. Mild LVH.  RIGHT VENTRICLE: Normal  LEFT ATRIUM: Moderately dilated  LEFT ATRIAL APPENDAGE: No clot  RIGHT ATRIUM: Normal  AORTIC VALVE:  Trileaflet. Mildly thickened. Mild to moderate AI. No AS.   MITRAL VALVE:   Flat closure. Severe central MR with 2 jets. Cannot exclude small defect in anterior leaflet. + flow reversal in pulmonary veins  TRICUSPID VALVE: Normal. Mild TR  PULMONIC VALVE: Trivial TR  INTERATRIAL SEPTUM: No PFO/ASD  PERICARDIUM: No effusion  DESCENDING AORTA: Severe plaque   Etiology of severe MR remains unclear to me as valve does not look that morphologically unusual. Given severity of MR, we did not cardiovert as she may need surgical evaluation and pre-op cath. Will review with Dr. Cornelius Moras. Continue heparin.  Images reviewed with Dr. Tenny Craw at time of procedure.  [name],MD 11:14 AM

## 2012-05-09 NOTE — Progress Notes (Signed)
PULMONARY  / CRITICAL CARE MEDICINE  Name: [name] [name] MRN: [id number] DOB: Jan 16, 1925    ADMISSION DATE:  05/06/2012 CONSULTATION DATE:  05/06/2012  REFERRING MD :  Transfer from [facility]  CHIEF COMPLAINT:  Acute respiratory failure  BRIEF PATIENT DESCRIPTION:  79 yo presented to [facility] with dyspnea for 5 days, suffered PEA cardiac arrest, was briefly resuscitated with ROSC and transferred to [facility] for farther management. EMD Dr Marchelle Gearing note: 11:41 AM on 05/09/2012: per carelink hx - patient coded after getting 1 dose azithromycin and diltiazem for rate control   SIGNIFICANT EVENTS / STUDIES:  2/11  Transferred form [facility] after suffering PEA cardiac arrest.  CT angio (from [facility]) >>> no PE, suspicion for elevated R heart pressure.  LINES / TUBES: Foley 2/11 >>> OETT 2/11 >>> 2/12 OGT 2/11 >>> 2/12 R fem TLC 2/11 >>> 2/11 L [place] TLC 2/11 >>>  CULTURES: 2/11  Blood >>>NTD 2/11  Respiratory >>> GPC >> normal flora  ANTIBIOTICS: 2/11  Ceftriaxone >>> 2/11 2/11  Levaquin >>> 2/13  2/11  Vanco >> 2/13  INTERVAL HISTORY:  VITAL SIGNS: Temp:  [97.7 F (36.5 C)-98.2 F (36.8 C)] 98.1 F (36.7 C) (02/14 0942) Pulse Rate:  [98-115] 107 (02/13 2100) Resp:  [11-41] 20 (02/14 1130) BP: (97-152)/(43-106) 115/72 mmHg (02/14 1130) SpO2:  [91 %-99 %] 93 % (02/14 1130) Weight:  [69.3 kg (152 lb 12.5 oz)-69.7 kg (153 lb 10.6 oz)] 69.3 kg (152 lb 12.5 oz) (02/14 0500)  HEMODYNAMICS: CVP:  [5 mmHg-7 mmHg] 7 mmHg  VENTILATOR SETTINGS:   INTAKE / OUTPUT: Intake/Output     02/13 0701 - 02/14 0700 02/14 0701 - 02/15 0700   P.O. 660    I.V. (mL/kg) 934.4 (13.5)    NG/GT     IV Piggyback 200    Total Intake(mL/kg) 1794.4 (25.9)    Urine (mL/kg/hr) 875 (0.5)    Emesis/NG output     Total Output 875     Net +919.4          Urine Occurrence 1 x    Stool Occurrence 3 x     PHYSICAL EXAMINATION: General:  Appears comfortable,  Neuro:  Awake and following commands HEENT:  PERRL, OP  clear Cardiovascular:  Irregularly irregular, tachycardic Lungs:  Bilateral diminished air entry, clear Abdomen:  Soft, nontender, bowel sounds diminished Musculoskeletal:  Moves all extremities, trace edema Skin:  Intact  LABS:  Recent Labs Lab 05/06/12 0229 05/06/12 0230 05/06/12 0239 05/06/12 0318 05/07/12 0400 05/08/12 0330 05/08/12 1151 05/09/12 0400  HGB 12.5  --   --   --  11.6* 12.5  --  12.5  WBC 12.2*  --   --   --  11.4* 11.0*  --  9.6  PLT 205  --   --   --  182 204  --  234  NA 134*  --   --   --  138 140  --  138  K 3.9  --   --   --  3.4* 3.2*  --  3.5  CL 100  --   --   --  101 99  --  101  CO2 23  --   --   --  26 29  --  29  GLUCOSE 237*  --   --   --  118* 138*  --  178*  BUN 53*  --   --   --  46* 38*  --  31*  CREATININE 1.10  --   --   --  1.21* 1.16*  --  0.99  CALCIUM 8.7  --   --   --  8.7 9.4  --  9.5  MG 2.3  --   --   --  1.8 1.8  --   --   PHOS  --   --   --   --  4.3 3.9  --   --   AST 168*  --   --   --   --   --   --   --   ALT 219*  --   --   --   --   --   --   --   ALKPHOS 86  --   --   --   --   --   --   --   BILITOT 0.7  --   --   --   --   --   --   --   PROT 5.5*  --   --   --   --   --   --   --   ALBUMIN 3.1*  --   --   --   --   --   --   --   APTT  --   --   --  27  --   --   --   --   INR  --   --   --  1.24  --   --   --   --   LATICACIDVEN  --   --  1.3  --   --   --   --   --   TROPONINI <0.30  --   --   --   --   --  <0.30  --   PROCALCITON 0.40  --   --   --  0.64 0.28  --   --   PROBNP 3044.0*  --   --   --   --   --   --   --   PHART  --  7.359  --   --   --   --   --   --   PCO2ART  --  37.7  --   --   --   --   --   --   PO2ART  --  69.4*  --   --   --   --   --   --    Recent Labs Lab 05/08/12 0354 05/08/12 0816 05/08/12 1152 05/08/12 2138 05/09/12 0822  GLUCAP 146* 158* 261* 218* 152*   Recent Labs Lab 05/06/12 0229 05/07/12 0400 05/08/12 0330  PROCALCITON 0.40 0.64 0.28    CXR:  2/12 >>> Hardware  in good position, bilateral effusions / airspace disease  ASSESSMENT / PLAN:  PULMONARY A:  Acute respiratory failure likely secondary to arrest, B effusions. P:   Change BD's to prn pulm toilet, mobilize Pleural effusion minimal, continue diureses and addressing it via treating the cardiac condition.  CARDIOVASCULAR A: PEA cardiac arrest.  Non-specific ST changes.  Atrial fibrillation with RVR.  Possible diastolic dysfxn, pulmonary hypertension, decompensated over ~ 1 week.  Appreciate cardiology input, milrinone and diuresis added 2/11 ASA TEE + cardioversion per cards. Likely will undergo L + R cath once stabilized (reassuring stress test ~ 1 yr ago).  RENAL A:  Acute renal insufficiency, with diuresis and probably decrease CO in setting A Fib P:   Follow CVP Trend BMP Diureses per cards as  renal function allows.  GASTROINTESTINAL A:  Congestive hepatopathy. P:   Protonix for GI Px Trend LFTs  HEMATOLOGIC A:  No active issues. P:  Trend CBC Heparin for DVT Px  INFECTIOUS A:  Possible CAP, although CT abdomen show only effusions, no parenchymal infiltrates. Pct reassuring. resp >> normal flora P:   D/ced vanco and levaquin 2/13  ENDOCRINE  A:  DM2.  Hyperglycemia. P:   D/c ICU HG protocol Restart her home lantus 18u Hold her home glucophage for now  NEUROLOGIC A:  Acute encephalopathy.  Resolved  P:    PCCM will sign off, please call back if needed.  Alyson Reedy, M.D 05/09/2012, 11:41 AM [facility] [phone] or if no answer [phone]

## 2012-05-09 NOTE — Progress Notes (Signed)
  Echocardiogram Echocardiogram Transesophageal has been performed.  [name] [name] 05/09/2012, 11:27 AM

## 2012-05-09 NOTE — Progress Notes (Addendum)
Patient ID: [name] [name], female   DOB: 15-Jun-1924, 79 y.o.   MRN: [id number]    Subjective:   Feels good. Lasix cut back yesterday as CR starting to rise. Denies dyspnea.+ cough. Remains in AF with RVR despite IV amio. Troponin remains negative. Co-ox 60% on milrinone 0.125 (dose cut back yesterday). CVP 8-9.   She had repeat echo yesterday EF 60% moderate MR (slightly improved from previous). LA "moderately dilated"   Objective:  Filed Vitals:   05/08/12 2200 05/08/12 2337 05/09/12 0357 05/09/12 0500  BP: 117/69 139/73 147/83   Pulse:      Temp:  98.2 F (36.8 C) 98.1 F (36.7 C)   TempSrc:  Oral Oral   Resp: 17  17   Height:      Weight:    69.3 kg (152 lb 12.5 oz)  SpO2: 93% 95% 93%     Intake/Output from previous day:  Intake/Output Summary (Last 24 hours) at 05/09/12 0715 Last data filed at 05/09/12 0700  Gross per 24 hour  Intake 1794.4 ml  Output    875 ml  Net  919.4 ml    Physical Exam: Affect appropriate Healthy:  appears stated age HEENT: normal Neck supple with no adenopathy JVP 9 no bruits no thyromegaly Lungs clear Heart:  S1/S2 MR murmur, no rub, gallop or click Abdomen: benighn, BS positve, no tenderness, no AAA no bruit.  No HSM or HJR Distal pulses intact with no bruits No edema Neuro non-focal Skin warm and dry   Lab Results: Basic Metabolic Panel:  Recent Labs  40/98/11 0400 05/08/12 0330 05/09/12 0400  NA 138 140 138  K 3.4* 3.2* 3.5  CL 101 99 101  CO2 26 29 29   GLUCOSE 118* 138* 178*  BUN 46* 38* 31*  CREATININE 1.21* 1.16* 0.99  CALCIUM 8.7 9.4 9.5  MG 1.8 1.8  --   PHOS 4.3 3.9  --    Liver Function Tests: No results found for this basename: AST, ALT, ALKPHOS, BILITOT, PROT, ALBUMIN,  in the last 72 hours CBC:  Recent Labs  05/08/12 0330 05/09/12 0400  WBC 11.0* 9.6  HGB 12.5 12.5  HCT 36.0 36.4  MCV 89.8 89.0  PLT 204 234   Cardiac Enzymes:  Recent Labs  05/08/12 1151  TROPONINI <0.30   Thyroid Function  Tests:  Recent Labs  05/06/12 1400  TSH 1.301    Imaging: Dg Chest Port 1 View  05/08/2012  *RADIOLOGY REPORT*  Clinical Data: Post extubation.  Congestive heart failure.  PORTABLE CHEST - 1 VIEW  Comparison: Chest x-ray 05/07/2012.  Findings: Nasogastric tube and endotracheal tubes have been removed. There is a left-sided subclavian central venous catheter with tip terminating in the distal superior vena cava. Lung volumes have slightly improved.  Right pleural effusion has nearly completely resolved.  Left pleural effusion is now small. Bibasilar opacities favored to represent resolving areas of subsegmental atelectasis have diminished.  Pulmonary vasculature is not normal.  Heart size is borderline enlarged.  Mediastinal contours are otherwise unremarkable.  Atherosclerosis in the thoracic aorta.  IMPRESSION: 1.  Support apparatus, as above. 2.  Significantly improved aeration related to decreasing bilateral pleural effusions and resolving bibasilar subsegmental atelectasis. 3.  Atherosclerosis.   Original Report Authenticated By: Trudie Reed, M.D.     Cardiac Studies:  ECG: 05/06/12  Rapid afib rate 117  Nonspecific ST/T wave changes   Telemetry: afib rates 100-115  05/09/2012   Echo: EF 60-65%  Mild LAE  Moderate  to severe MR  Medications:   . antiseptic oral rinse  1 application Mouth Rinse BID  . aspirin  325 mg Oral Daily  . furosemide  40 mg Intravenous Daily  . insulin aspart  0-15 Units Subcutaneous TID WC  . insulin aspart  0-5 Units Subcutaneous QHS  . insulin glargine  18 Units Subcutaneous QHS  . metoprolol tartrate  25 mg Oral BID  . pantoprazole  40 mg Oral Daily  . rOPINIRole  0.5 mg Oral QHS     . sodium chloride 10 mL/hr at 05/08/12 2000  . amiodarone (NEXTERONE PREMIX) 360 mg/200 mL dextrose 0.5 mg/min (05/09/12 0641)  . heparin 650 Units/hr (05/09/12 1610)  . milrinone 0.125 mcg/kg/min (05/08/12 1916)    Assessment:   1. Atrial fib with RVR 2. Acute  diastolic HF 3. Cardiogenic shock 4. Mitral regurgitation 5. DM2 6. Non-obstructive CAD by cath 1995 7. Hypokalemia 8. VDRF, resolved    Plan/Discussion:   She is much improved. Given normal troponin, I think main issue is new-onset AF in the setting of diastolic dysfunction. However she also has significant MR. Will proceed with TEE (to look at MV and LAA) with possible DC-CV today. (we considered NOAC but with Medicare unlikely to afford).  Continue amio - will switch to 200 bid. Stop milrinone. Switch lasix to 40mg  po bid. Supp K+. Repeat CXR to evalaute for any airspace disease after diurese.  Over weekend would continue to optimize volume status using CVP and follow co-ox.   Aryn Kops 05/09/2012, 7:15 AM

## 2012-05-09 NOTE — Progress Notes (Signed)
ANTICOAGULATION CONSULT NOTE - Follow Up Consult  Pharmacy Consult for heparin>>warfarin Indication: atrial fibrillation  Allergies  Allergen Reactions  . Codeine Other (See Comments)    Unknown     Patient Measurements: Height: 5\' 2"  (157.5 cm) Weight: 152 lb 12.5 oz (69.3 kg) IBW/kg (Calculated) : 50.1 Heparin Dosing Weight: 67 kg  Vital Signs: Temp: 98.1 F (36.7 C) (02/14 0357) Temp src: Oral (02/14 0357) BP: 147/83 mmHg (02/14 0357) Pulse Rate: 107 (02/13 2100)  Labs:  Recent Labs  05/07/12 0400  05/07/12 1800 05/08/12 0330 05/08/12 1151 05/09/12 0400  HGB 11.6*  --   --  12.5  --  12.5  HCT 33.6*  --   --  36.0  --  36.4  PLT 182  --   --  204  --  234  HEPARINUNFRC  --   < > 0.42 0.54  --  0.59  CREATININE 1.21*  --   --  1.16*  --  0.99  TROPONINI  --   --   --   --  <0.30  --   < > = values in this interval not displayed.  Estimated Creatinine Clearance: 37.9 ml/min (by C-G formula based on Cr of 0.99).  Assessment: Patient is an 79 y.o F s/p PEA prior to transfer to [facility].  Now on heparin therapy for new afib with RVR.  Heparin level remains at goal 0.59 on 650 units/hr. No bleeding noted per RN. Planning TEE/DCCV today, insurance not likely to cover NOAC will transition to warfarin tonight. Baesline INR 1.2 on 2/11.   Goal of Therapy:  Heparin level 0.3-0.7 units/ml Monitor platelets by anticoagulation protocol: Yes INR goal 2-3   Plan:  1) Continue heparin at 650 units/hr 2) Daily heparin level and INR 3) Warfarin 5mg  tonight  Severiano Gilbert 05/09/2012,8:26 AM [phone]

## 2012-05-09 NOTE — Preoperative (Signed)
Beta Blockers   Reason not to administer Beta Blockers:Not Applicable 

## 2012-05-09 NOTE — Progress Notes (Signed)
CARDIAC REHAB PHASE I   PRE:  Rate/Rhythm: afib 109-120(non sustained)  BP:  Supine:   Sitting: 146/73  Standing:    SaO2: 96 Ra  Currently O2 therapy off to the side  MODE:  Ambulation: 170 ft   POST:  Rate/Rhythm: afib  108-123  BP:  Supine:   Sitting:152/82  Standing:    SaO2: Ra 95 Ok given by primary Rn to ambulate pt in hallway.  Pt received conscious sedation this morning for TEE.  Pt alert and oriented.  Ambulated pt 170 feet x 1 assist and RW.  Pt gait steady but slow.  Pt to chair with family at bedside.  Pt denies any needs or discomfort, call bell within reach. [phone]  Arna Medici

## 2012-05-10 ENCOUNTER — Encounter (HOSPITAL_COMMUNITY): Payer: Self-pay | Admitting: Internal Medicine

## 2012-05-10 LAB — CARBOXYHEMOGLOBIN
Methemoglobin: 1 % (ref 0.0–1.5)
O2 Saturation: 62.2 %
Total hemoglobin: 13.4 g/dL (ref 12.0–16.0)

## 2012-05-10 LAB — PROTIME-INR
INR: 1.15 (ref 0.00–1.49)
Prothrombin Time: 14.5 seconds (ref 11.6–15.2)

## 2012-05-10 LAB — CBC
MCH: 30.8 pg (ref 26.0–34.0)
MCHC: 34.7 g/dL (ref 30.0–36.0)
Platelets: 218 10*3/uL (ref 150–400)
RBC: 4.22 MIL/uL (ref 3.87–5.11)

## 2012-05-10 LAB — GLUCOSE, CAPILLARY
Glucose-Capillary: 235 mg/dL — ABNORMAL HIGH (ref 70–99)
Glucose-Capillary: 191 mg/dL — ABNORMAL HIGH (ref 70–99)

## 2012-05-10 LAB — HEPARIN LEVEL (UNFRACTIONATED): Heparin Unfractionated: 0.46 IU/mL (ref 0.30–0.70)

## 2012-05-10 MED ORDER — WARFARIN SODIUM 5 MG PO TABS
5.0000 mg | ORAL_TABLET | Freq: Once | ORAL | Status: AC
  Administered 2012-05-10: 5 mg via ORAL
  Filled 2012-05-10: qty 1

## 2012-05-10 NOTE — Progress Notes (Addendum)
Patient ID: [name] [name], female   DOB: [birth year], 79 y.o.   MRN: [id number]    Subjective:   Patient is feeling better.  TEE yesterday revealed severe MR.  She remains in AF .   Objective:  Filed Vitals:   05/09/12 2352 05/09/12 2354 05/10/12 0338 05/10/12 0742  BP:  156/81 126/70   Pulse:      Temp: 97.8 F (36.6 C)  97.9 F (36.6 C) 97.8 F (36.6 C)  TempSrc: Oral  Oral Oral  Resp:  19 17   Height:      Weight:   152 lb 1.9 oz (69 kg)   SpO2:  96% 92% 93%    Intake/Output from previous day:  Intake/Output Summary (Last 24 hours) at 05/10/12 7829 Last data filed at 05/10/12 0600  Gross per 24 hour  Intake  789.5 ml  Output    575 ml  Net  214.5 ml    Physical Exam: Affect appropriate Healthy:  appears stated age HEENT: normal Neck supple with no adenopathy JVP - normal Lungs clear Heart:  S1/S2 MR murmur, no rub, gallop or click Abdomen: benighn, BS positve, no tenderness, no AAA no bruit.  No HSM or HJR Distal pulses intact with no bruits No edema, skin turgor is slightly decreased  Neuro non-focal Skin warm and dry   Lab Results: Basic Metabolic Panel:  Recent Labs  56/21/30 0330 05/09/12 0400  NA 140 138  K 3.2* 3.5  CL 99 101  CO2 29 29  GLUCOSE 138* 178*  BUN 38* 31*  CREATININE 1.16* 0.99  CALCIUM 9.4 9.5  MG 1.8  --   PHOS 3.9  --    Liver Function Tests: No results found for this basename: AST, ALT, ALKPHOS, BILITOT, PROT, ALBUMIN,  in the last 72 hours CBC:  Recent Labs  05/09/12 0400 05/10/12 0400  WBC 9.6 8.6  HGB 12.5 13.0  HCT 36.4 37.5  MCV 89.0 88.9  PLT 234 218   Cardiac Enzymes:  Recent Labs  05/08/12 1151  TROPONINI <0.30   Thyroid Function Tests: No results found for this basename: TSH, T4TOTAL, FREET3, T3FREE, THYROIDAB,  in the last 72 hours  Imaging: Dg Chest Port 1 View  05/08/2012  *RADIOLOGY REPORT*  Clinical Data: Post extubation.  Congestive heart failure.  PORTABLE CHEST - 1 VIEW  Comparison:  Chest x-ray 05/07/2012.  Findings: Nasogastric tube and endotracheal tubes have been removed. There is a left-sided subclavian central venous catheter with tip terminating in the distal superior vena cava. Lung volumes have slightly improved.  Right pleural effusion has nearly completely resolved.  Left pleural effusion is now small. Bibasilar opacities favored to represent resolving areas of subsegmental atelectasis have diminished.  Pulmonary vasculature is not normal.  Heart size is borderline enlarged.  Mediastinal contours are otherwise unremarkable.  Atherosclerosis in the thoracic aorta.  IMPRESSION: 1.  Support apparatus, as above. 2.  Significantly improved aeration related to decreasing bilateral pleural effusions and resolving bibasilar subsegmental atelectasis. 3.  Atherosclerosis.   Original Report Authenticated By: Trudie Reed, M.D.     Cardiac Studies:  ECG: 05/06/12  Rapid afib rate 117  Nonspecific ST/T wave changes   Telemetry: afib rates 100-115  05/10/2012   Echo: EF 60-65%  Mild LAE  Moderate to severe MR  Medications:   . amiodarone  200 mg Oral BID  . antiseptic oral rinse  1 application Mouth Rinse BID  . furosemide  40 mg Oral BID  . insulin aspart  0-15 Units Subcutaneous TID WC  . insulin aspart  0-5 Units Subcutaneous QHS  . insulin glargine  18 Units Subcutaneous QHS  . metoprolol tartrate  25 mg Oral BID  . pantoprazole  40 mg Oral Daily  . potassium chloride  40 mEq Oral Once  . rOPINIRole  0.5 mg Oral QHS  . Warfarin - Pharmacist Dosing Inpatient   Does not apply q1800     . sodium chloride 10 mL/hr at 05/09/12 1944  . heparin 650 Units/hr (05/09/12 1900)    Assessment:   1. Atrial fib with RVR 2. Acute diastolic HF 3. Cardiogenic shock 4. Mitral regurgitation 5. DM2 6. Non-obstructive CAD by cath 1995 7. Hypokalemia 8. VDRF, resolved    Plan/Discussion:   She is much improved. Given normal troponin, I think main issue is new-onset AF in the  setting of diastolic dysfunction. However she also has significant MR.  TEE revealed severe MR.  There is discussion about surgical repair of her MV.   For cath on Monday.   Getting a R and L heart cath on Monday.  She is getting Lasix 40 PO BID.  Will check BMP today and tomorrow.     [name] [name]. 05/10/2012, 8:03 AM

## 2012-05-10 NOTE — Progress Notes (Signed)
CARDIAC REHAB PHASE I   PRE:  Rate/Rhythm: 98 AFIB  BP:  Supine:   Sitting: 132/82  Standing:    SaO2: 95 % RA  MODE:  Ambulation: 600 ft   POST:  Rate/Rhythem: 120 AFIB  BP:  Supine:   Sitting: 138/96  Standing:    SaO2: 93 % RA  Pt ambulated x1 assist with Rw. Patient tolerated well. No complaints. Patient very delightful. Pt back to recliner with call bell in reach. Family in room and lunch tray ready. Will continue to follow patient on Monday.  [id number]  Bren Borys, Toni Amend

## 2012-05-10 NOTE — Progress Notes (Signed)
ANTICOAGULATION CONSULT NOTE - Follow Up Consult  Pharmacy Consult for heparin>>warfarin Indication: atrial fibrillation  Allergies  Allergen Reactions  . Codeine Other (See Comments)    Unknown     Patient Measurements: Height: 5\' 2"  (157.5 cm) Weight: 152 lb 1.9 oz (69 kg) IBW/kg (Calculated) : 50.1 Heparin Dosing Weight: 67 kg  Vital Signs: Temp: 97.8 F (36.6 C) (02/15 0742) Temp src: Oral (02/15 0742) BP: 126/70 mmHg (02/15 0338) Pulse Rate: 103 (02/14 2013)  Labs:  Recent Labs  05/08/12 0330 05/08/12 1151 05/09/12 0400 05/10/12 0400  HGB 12.5  --  12.5 13.0  HCT 36.0  --  36.4 37.5  PLT 204  --  234 218  LABPROT  --   --   --  14.5  INR  --   --   --  1.15  HEPARINUNFRC 0.54  --  0.59 0.46  CREATININE 1.16*  --  0.99  --   TROPONINI  --  <0.30  --   --     Estimated Creatinine Clearance: 37.8 ml/min (by C-G formula based on Cr of 0.99).   Medications:  Scheduled:  . amiodarone  200 mg Oral BID  . antiseptic oral rinse  1 application Mouth Rinse BID  . furosemide  40 mg Oral BID  . insulin aspart  0-15 Units Subcutaneous TID WC  . insulin aspart  0-5 Units Subcutaneous QHS  . insulin glargine  18 Units Subcutaneous QHS  . metoprolol tartrate  25 mg Oral BID  . pantoprazole  40 mg Oral Daily  . [COMPLETED] patient's guide to using coumadin book   Does not apply Once  . potassium chloride  40 mEq Oral Once  . rOPINIRole  0.5 mg Oral QHS  . [COMPLETED] warfarin  5 mg Oral ONCE-1800  . [COMPLETED] warfarin   Does not apply Once  . Warfarin - Pharmacist Dosing Inpatient   Does not apply q1800  . [DISCONTINUED] aspirin  325 mg Oral Daily   Infusions:  . sodium chloride 10 mL/hr at 05/09/12 1944  . heparin 650 Units/hr (05/09/12 1900)  . [DISCONTINUED] sodium chloride      Assessment: Patient is an 79 y.o F s/p PEA prior to transfer to [facility]. Now on heparin therapy for new afib with RVR. Heparin level remains at goal 0.46 on 650 units/hr. Baesline INR  1.2 on 2/11. Insurance not likely to cover NOAC will transition to warfarin. Warfarin started last night and INR this morning is 1.15. Note that patient is also on amiodarone. No bleeding noted.    Goal of Therapy:  Heparin level 0.3-0.7 units/ml Monitor platelets by anticoagulation protocol: Yes INR goal 2-3  Plan:  1) Continue heparin at 650 units/hr 2) Daily heparin level and INR  3) Warfarin 5mg  tonight  Lillia Pauls, PharmD Clinical Pharmacist Pager: [phone] Phone: [phone] 05/10/2012 8:09 AM

## 2012-05-11 LAB — URINALYSIS, ROUTINE W REFLEX MICROSCOPIC
Bilirubin Urine: NEGATIVE
Glucose, UA: NEGATIVE mg/dL
Ketones, ur: NEGATIVE mg/dL
Protein, ur: NEGATIVE mg/dL

## 2012-05-11 LAB — CBC
HCT: 39.8 % (ref 36.0–46.0)
MCV: 88.8 fL (ref 78.0–100.0)
Platelets: 233 10*3/uL (ref 150–400)
RBC: 4.48 MIL/uL (ref 3.87–5.11)
WBC: 8.7 10*3/uL (ref 4.0–10.5)

## 2012-05-11 LAB — CARBOXYHEMOGLOBIN
Carboxyhemoglobin: 1.7 % — ABNORMAL HIGH (ref 0.5–1.5)
Methemoglobin: 0.9 % (ref 0.0–1.5)
Total hemoglobin: 14.7 g/dL (ref 12.0–16.0)

## 2012-05-11 LAB — BASIC METABOLIC PANEL
BUN: 27 mg/dL — ABNORMAL HIGH (ref 6–23)
Chloride: 98 mEq/L (ref 96–112)
GFR calc Af Amer: 62 mL/min — ABNORMAL LOW (ref >90)
Potassium: 3.1 mEq/L — ABNORMAL LOW (ref 3.5–5.1)

## 2012-05-11 LAB — GLUCOSE, CAPILLARY
Glucose-Capillary: 141 mg/dL — ABNORMAL HIGH (ref 70–99)
Glucose-Capillary: 187 mg/dL — ABNORMAL HIGH (ref 70–99)

## 2012-05-11 LAB — PROTIME-INR: Prothrombin Time: 21 seconds — ABNORMAL HIGH (ref 11.6–15.2)

## 2012-05-11 LAB — URINE MICROSCOPIC-ADD ON

## 2012-05-11 MED ORDER — SODIUM CHLORIDE 0.9 % IV SOLN
1.0000 mL/kg/h | INTRAVENOUS | Status: DC
  Administered 2012-05-12: 1 mL/kg/h via INTRAVENOUS

## 2012-05-11 MED ORDER — SODIUM CHLORIDE 0.9 % IJ SOLN: 3.0000 mL | Freq: Two times a day (BID) | INTRAMUSCULAR | Status: DC

## 2012-05-11 MED ORDER — ASPIRIN 81 MG PO CHEW
324.0000 mg | CHEWABLE_TABLET | ORAL | Status: AC
  Administered 2012-05-12: 324 mg via ORAL
  Filled 2012-05-11: qty 4

## 2012-05-11 MED ORDER — POTASSIUM CHLORIDE 10 MEQ/100ML IV SOLN
10.0000 meq | INTRAVENOUS | Status: AC
  Administered 2012-05-11 (×4): 10 meq via INTRAVENOUS
  Filled 2012-05-11 (×4): qty 100

## 2012-05-11 MED ORDER — SODIUM CHLORIDE 0.9 % IJ SOLN: 3.0000 mL | INTRAMUSCULAR | Status: DC | PRN

## 2012-05-11 MED ORDER — FUROSEMIDE 40 MG PO TABS
40.0000 mg | ORAL_TABLET | Freq: Every day | ORAL | Status: DC
  Administered 2012-05-11 – 2012-05-14 (×4): 40 mg via ORAL
  Filled 2012-05-11 (×3): qty 1

## 2012-05-11 MED ORDER — VITAMIN K1 10 MG/ML IJ SOLN
2.0000 mg | Freq: Once | INTRAVENOUS | Status: AC
  Administered 2012-05-11: 2 mg via INTRAVENOUS
  Filled 2012-05-11: qty 0.2

## 2012-05-11 MED ORDER — SODIUM CHLORIDE 0.9 % IV SOLN: 250.0000 mL | INTRAVENOUS | Status: DC | PRN

## 2012-05-11 MED ORDER — POTASSIUM CHLORIDE 20 MEQ/15ML (10%) PO LIQD
40.0000 meq | Freq: Once | ORAL | Status: AC
  Administered 2012-05-11: 40 meq
  Filled 2012-05-11: qty 30

## 2012-05-11 NOTE — Progress Notes (Signed)
  TEE images reviewed with Dr. Cornelius Moras. She has severe mitral regurgitation likely due to annular dilation from LA enlargement (LV is normal size). He feels she is a reasonable candidate for mini-mitral surgery with a ring and Maze procedure.  I had a long talk with them about the risks/benefits of MV surgery versus a medical approach to severe MV disease. They would like to discuss further but are leaning toward proceeding with surgery.  We will plan R & L cath tomorrow with abdominal aortogram through LEFT femoral approach. Orders written.  Dr. Cornelius Moras will see her tomorrow for a full consult.  Total time spent = 50 minutes.  Shelba Susi,MD 3:11 PM

## 2012-05-11 NOTE — Progress Notes (Signed)
eLink Physician-Brief Progress Note Patient Name: [name] [name] DOB: 1924/08/01 MRN: [id number]  Date of Service  05/11/2012   HPI/Events of Note     eICU Interventions  Hypokalemia, repleted    Intervention Category Intermediate Interventions: Electrolyte abnormality - evaluation and management  Yurani Fettes 05/11/2012, 5:59 AM

## 2012-05-11 NOTE — Progress Notes (Signed)
ANTICOAGULATION CONSULT NOTE - Follow Up Consult  Pharmacy Consult for Heparin/Coumadin Indication: atrial fibrillation  Allergies  Allergen Reactions  . Codeine Other (See Comments)    Unknown     Patient Measurements: Height: 5\' 2"  (157.5 cm) Weight: 150 lb 2.1 oz (68.1 kg) IBW/kg (Calculated) : 50.1 Heparin Dosing Weight: 68 kg  Vital Signs: Temp: 97.7 F (36.5 C) (02/16 0738) Temp src: Oral (02/16 0738) BP: 149/79 mmHg (02/16 0430)  Labs:  Recent Labs  05/08/12 1151  05/09/12 0400 05/10/12 0400 05/11/12 0327  HGB  --   < > 12.5 13.0 13.9  HCT  --   --  36.4 37.5 39.8  PLT  --   --  234 218 233  LABPROT  --   --   --  14.5 21.0*  INR  --   --   --  1.15 1.89*  HEPARINUNFRC  --   --  0.59 0.46 0.49  CREATININE  --   --  0.99  --  0.94  TROPONINI <0.30  --   --   --   --   < > = values in this interval not displayed.  Estimated Creatinine Clearance: 39.6 ml/min (by C-G formula based on Cr of 0.94).   Medications:  Scheduled:  . amiodarone  200 mg Oral BID  . antiseptic oral rinse  1 application Mouth Rinse BID  . furosemide  40 mg Oral Daily  . insulin aspart  0-15 Units Subcutaneous TID WC  . insulin aspart  0-5 Units Subcutaneous QHS  . insulin glargine  18 Units Subcutaneous QHS  . metoprolol tartrate  25 mg Oral BID  . pantoprazole  40 mg Oral Daily  . phytonadione (VITAMIN K) IV  2 mg Intravenous Once  . potassium chloride  10 mEq Intravenous Q1 Hr x 4  . [COMPLETED] potassium chloride  40 mEq Per Tube Once  . potassium chloride  40 mEq Oral Once  . rOPINIRole  0.5 mg Oral QHS  . [COMPLETED] warfarin  5 mg Oral ONCE-1800  . [DISCONTINUED] furosemide  40 mg Oral BID  . [DISCONTINUED] Warfarin - Pharmacist Dosing Inpatient   Does not apply q1800   Infusions:  . sodium chloride 10 mL/hr at 05/11/12 0600  . heparin 650 Units/hr (05/11/12 0600)    Assessment: Patient is an 79 y.o F s/p PEA prior to transfer to [facility]. Now on heparin therapy for new  afib with RVR. Heparin level remains at goal (0.49) on 650 units/hr. Patient has plans to go for cardiac cath tomorrow, HOLD Coumadin.  Goal of Therapy:  Heparin level 0.3-0.7 units/ml Monitor platelets by anticoagulation protocol: Yes   Plan:  1) Continue heparin at 650 units/hr 2) Daily heparin level and INR  3) NO Coumadin tonight--cath planned for tomorrow  Lillia Pauls, PharmD Clinical Pharmacist Pager: [phone] Phone: [phone] 05/11/2012 10:13 AM

## 2012-05-11 NOTE — Progress Notes (Signed)
Patient ID: [name] [name], female   DOB: December 02, 1924, 79 y.o.   MRN: [id number]    Subjective:   Patient is feeling better.  TEE yesterday revealed severe MR.  She remains in AF .   Objective:  Filed Vitals:   05/10/12 1939 05/10/12 2343 05/11/12 0430 05/11/12 0500  BP: 127/71 125/50 149/79   Pulse:      Temp:  98.3 F (36.8 C) 98.2 F (36.8 C)   TempSrc:  Oral Oral   Resp: 22 15 16    Height:      Weight:    150 lb 2.1 oz (68.1 kg)  SpO2: 95% 95% 96%     Intake/Output from previous day:  Intake/Output Summary (Last 24 hours) at 05/11/12 4098 Last data filed at 05/11/12 0600  Gross per 24 hour  Intake  889.5 ml  Output    800 ml  Net   89.5 ml    Physical Exam: Affect appropriate Healthy:  appears stated age HEENT: normal Neck supple with no adenopathy JVP - normal Lungs clear Heart:  S1/S2 MR murmur, no rub, gallop or click Abdomen: benighn, BS positve, no tenderness, no AAA no bruit.  No HSM or HJR Distal pulses intact with no bruits No edema, skin turgor is slightly decreased  Neuro non-focal Skin warm and dry   Lab Results: Basic Metabolic Panel:  Recent Labs  11/91/47 0400 05/11/12 0327  NA 138 135  K 3.5 3.1*  CL 101 98  CO2 29 30  GLUCOSE 178* 143*  BUN 31* 27*  CREATININE 0.99 0.94  CALCIUM 9.5 9.3   Liver Function Tests: No results found for this basename: AST, ALT, ALKPHOS, BILITOT, PROT, ALBUMIN,  in the last 72 hours CBC:  Recent Labs  05/10/12 0400 05/11/12 0327  WBC 8.6 8.7  HGB 13.0 13.9  HCT 37.5 39.8  MCV 88.9 88.8  PLT 218 233   Cardiac Enzymes:  Recent Labs  05/08/12 1151  TROPONINI <0.30   Thyroid Function Tests: No results found for this basename: TSH, T4TOTAL, FREET3, T3FREE, THYROIDAB,  in the last 72 hours  Imaging: No results found.  Cardiac Studies:  ECG: 05/06/12  Rapid afib rate 117  Nonspecific ST/T wave changes   Telemetry: afib rates 100-115  05/11/2012   Echo: EF 60-65%  Mild LAE  Moderate to  severe MR  Medications:   . amiodarone  200 mg Oral BID  . antiseptic oral rinse  1 application Mouth Rinse BID  . furosemide  40 mg Oral BID  . insulin aspart  0-15 Units Subcutaneous TID WC  . insulin aspart  0-5 Units Subcutaneous QHS  . insulin glargine  18 Units Subcutaneous QHS  . metoprolol tartrate  25 mg Oral BID  . pantoprazole  40 mg Oral Daily  . potassium chloride  10 mEq Intravenous Q1 Hr x 4  . potassium chloride  40 mEq Per Tube Once  . potassium chloride  40 mEq Oral Once  . rOPINIRole  0.5 mg Oral QHS  . Warfarin - Pharmacist Dosing Inpatient   Does not apply q1800     . sodium chloride 10 mL/hr at 05/11/12 0600  . heparin 650 Units/hr (05/11/12 0600)    Assessment:   1. Atrial fib with RVR 2. Acute diastolic HF 3. Cardiogenic shock 4. Mitral regurgitation 5. DM2 6. Non-obstructive CAD by cath 1995 7. Hypokalemia 8. VDRF, resolved    Plan/Discussion:   She is much improved. Given normal troponin, I think main issue  is new-onset AF in the setting of diastolic dysfunction. However she also has significant MR.  TEE revealed severe MR.  There is discussion about surgical repair of her MV.   For cath on Monday.   Getting a R and L heart cath on Monday.  INR is 1.89.  Coumadin is on hold.  Will give Vit. K 2 mg IV this am.  Recheck in the am.   She is getting Lasix 40 PO BID.  Will change to 40 gm daily.  She has been ordered to get extra potassium this am.     [name]. 05/11/2012, 7:23 AM

## 2012-05-12 ENCOUNTER — Encounter (HOSPITAL_COMMUNITY)
Admission: RE | Disposition: A | Discharge status: Skilled Nursing Facility | Payer: Self-pay | Source: Other Acute Inpatient Hospital | Attending: Internal Medicine

## 2012-05-12 DIAGNOSIS — I059 Rheumatic mitral valve disease, unspecified: Secondary | ICD-10-CM

## 2012-05-12 DIAGNOSIS — I2510 Atherosclerotic heart disease of native coronary artery without angina pectoris: Secondary | ICD-10-CM

## 2012-05-12 DIAGNOSIS — I700 Atherosclerosis of aorta: Secondary | ICD-10-CM

## 2012-05-12 DIAGNOSIS — I4891 Unspecified atrial fibrillation: Secondary | ICD-10-CM

## 2012-05-12 HISTORY — PX: LEFT AND RIGHT HEART CATHETERIZATION WITH CORONARY ANGIOGRAM: SHX5449

## 2012-05-12 LAB — CULTURE, BLOOD (ROUTINE X 2): Culture: NO GROWTH

## 2012-05-12 LAB — BASIC METABOLIC PANEL
BUN: 27 mg/dL — ABNORMAL HIGH (ref 6–23)
Chloride: 102 mEq/L (ref 96–112)
GFR calc Af Amer: 66 mL/min — ABNORMAL LOW (ref >90)
Potassium: 3.8 mEq/L (ref 3.5–5.1)
Sodium: 138 mEq/L (ref 135–145)

## 2012-05-12 LAB — POCT I-STAT 3, VENOUS BLOOD GAS (G3P V)
Acid-base deficit: 1 mmol/L (ref 0.0–2.0)
Bicarbonate: 24.4 mEq/L — ABNORMAL HIGH (ref 20.0–24.0)
O2 Saturation: 58 %
TCO2: 26 mmol/L (ref 0–100)

## 2012-05-12 LAB — GLUCOSE, CAPILLARY
Glucose-Capillary: 141 mg/dL — ABNORMAL HIGH (ref 70–99)
Glucose-Capillary: 107 mg/dL — ABNORMAL HIGH (ref 70–99)
Glucose-Capillary: 280 mg/dL — ABNORMAL HIGH (ref 70–99)
Glucose-Capillary: 127 mg/dL — ABNORMAL HIGH (ref 70–99)

## 2012-05-12 LAB — CBC
HCT: 38.6 % (ref 36.0–46.0)
MCV: 89.4 fL (ref 78.0–100.0)
Platelets: 209 10*3/uL (ref 150–400)
RBC: 4.32 MIL/uL (ref 3.87–5.11)
WBC: 8.1 10*3/uL (ref 4.0–10.5)

## 2012-05-12 LAB — URINALYSIS, ROUTINE W REFLEX MICROSCOPIC
Protein, ur: NEGATIVE mg/dL
Specific Gravity, Urine: 1.02 (ref 1.005–1.030)
Urobilinogen, UA: 0.2 mg/dL (ref 0.0–1.0)

## 2012-05-12 LAB — PROTIME-INR: INR: 1.23 (ref 0.00–1.49)

## 2012-05-12 LAB — URINE CULTURE: Colony Count: 15000

## 2012-05-12 LAB — URINE MICROSCOPIC-ADD ON

## 2012-05-12 LAB — POCT I-STAT 3, ART BLOOD GAS (G3+): O2 Saturation: 99 %

## 2012-05-12 LAB — CARBOXYHEMOGLOBIN: Methemoglobin: 1.2 % (ref 0.0–1.5)

## 2012-05-12 SURGERY — LEFT AND RIGHT HEART CATHETERIZATION WITH CORONARY ANGIOGRAM
Anesthesia: LOCAL

## 2012-05-12 MED ORDER — ACETAMINOPHEN 325 MG PO TABS: 650.0000 mg | ORAL_TABLET | ORAL | Status: DC | PRN

## 2012-05-12 MED ORDER — ONDANSETRON HCL 4 MG/2ML IJ SOLN: 4.0000 mg | Freq: Four times a day (QID) | INTRAMUSCULAR | Status: DC | PRN

## 2012-05-12 MED ORDER — SODIUM CHLORIDE 0.9 % IV SOLN: INTRAVENOUS | Status: AC

## 2012-05-12 MED ORDER — HEPARIN (PORCINE) IN NACL 2-0.9 UNIT/ML-% IJ SOLN
INTRAMUSCULAR | Status: AC
  Filled 2012-05-12: qty 500

## 2012-05-12 MED ORDER — HEPARIN (PORCINE) IN NACL 100-0.45 UNIT/ML-% IJ SOLN
600.0000 [IU]/h | INTRAMUSCULAR | Status: DC
Start: 2012-05-12 — End: 2012-05-14
  Administered 2012-05-12 – 2012-05-14 (×2): 600 [IU]/h via INTRAVENOUS
  Filled 2012-05-12 (×3): qty 250

## 2012-05-12 MED ORDER — MIDAZOLAM HCL 2 MG/2ML IJ SOLN
INTRAMUSCULAR | Status: AC
  Filled 2012-05-12: qty 2

## 2012-05-12 MED ORDER — NITROGLYCERIN 1 MG/10 ML FOR IR/CATH LAB
INTRA_ARTERIAL | Status: AC
  Filled 2012-05-12: qty 10

## 2012-05-12 MED ORDER — LIDOCAINE HCL (PF) 1 % IJ SOLN
INTRAMUSCULAR | Status: AC
  Filled 2012-05-12: qty 30

## 2012-05-12 NOTE — Progress Notes (Signed)
SUBJECTIVE:  She is breathing at baseline. She denies any chest pain.  She ambulated without difficulty.    PHYSICAL EXAM Filed Vitals:   05/12/12 0354 05/12/12 0500 05/12/12 0804 05/12/12 0823  BP: 120/77  148/82   Pulse:   110 120  Temp: 97.5 F (36.4 C)  98.1 F (36.7 C)   TempSrc: Oral  Oral   Resp: 20     Height:      Weight:  148 lb 2.4 oz (67.2 kg)    SpO2: 95%  98% 97%   General:  No distress Lungs:  Clear Heart:  Irregular Abdomen:  Positive bowel sounds, no rebound no guarding Extremities:  No edema  LABS: Lab Results  Component Value Date   TROPONINI <0.30 05/08/2012   Results for orders placed during the hospital encounter of 05/06/12 (from the past 24 hour(s))  GLUCOSE, CAPILLARY     Status: Abnormal   Collection Time    05/11/12 11:48 AM      Result Value Range   Glucose-Capillary 259 (*) 70 - 99 mg/dL   Comment 1 Notify RN    URINALYSIS, ROUTINE W REFLEX MICROSCOPIC     Status: Abnormal   Collection Time    05/11/12 12:38 PM      Result Value Range   Color, Urine RED (*) YELLOW   APPearance CLOUDY (*) CLEAR   Specific Gravity, Urine 1.009  1.005 - 1.030   pH 7.0  5.0 - 8.0   Glucose, UA NEGATIVE  NEGATIVE mg/dL   Hgb urine dipstick LARGE (*) NEGATIVE   Bilirubin Urine NEGATIVE  NEGATIVE   Ketones, ur NEGATIVE  NEGATIVE mg/dL   Protein, ur NEGATIVE  NEGATIVE mg/dL   Urobilinogen, UA 0.2  0.0 - 1.0 mg/dL   Nitrite NEGATIVE  NEGATIVE   Leukocytes, UA LARGE (*) NEGATIVE  URINE MICROSCOPIC-ADD ON     Status: None   Collection Time    05/11/12 12:38 PM      Result Value Range   Squamous Epithelial / LPF RARE  RARE   WBC, UA 7-10  <3 WBC/hpf   RBC / HPF TOO NUMEROUS TO COUNT  <3 RBC/hpf  GLUCOSE, CAPILLARY     Status: Abnormal   Collection Time    05/11/12  5:00 PM      Result Value Range   Glucose-Capillary 149 (*) 70 - 99 mg/dL   Comment 1 Notify RN    GLUCOSE, CAPILLARY     Status: Abnormal   Collection Time    05/11/12 10:02 PM   Result Value Range   Glucose-Capillary 187 (*) 70 - 99 mg/dL  PROTIME-INR     Status: Abnormal   Collection Time    05/12/12  5:15 AM      Result Value Range   Prothrombin Time 15.3 (*) 11.6 - 15.2 seconds   INR 1.23  0.00 - 1.49  CBC     Status: None   Collection Time    05/12/12  5:15 AM      Result Value Range   WBC 8.1  4.0 - 10.5 K/uL   RBC 4.32  3.87 - 5.11 MIL/uL   Hemoglobin 13.6  12.0 - 15.0 g/dL   HCT 96.2  95.2 - 84.1 %   MCV 89.4  78.0 - 100.0 fL   MCH 31.5  26.0 - 34.0 pg   MCHC 35.2  30.0 - 36.0 g/dL   RDW 32.4  40.1 - 02.7 %   Platelets 209  150 - 400 K/uL  HEPARIN LEVEL (UNFRACTIONATED)     Status: Abnormal   Collection Time    05/12/12  5:15 AM      Result Value Range   Heparin Unfractionated 0.72 (*) 0.30 - 0.70 IU/mL  BASIC METABOLIC PANEL     Status: Abnormal   Collection Time    05/12/12  5:15 AM      Result Value Range   Sodium 138  135 - 145 mEq/L   Potassium 3.8  3.5 - 5.1 mEq/L   Chloride 102  96 - 112 mEq/L   CO2 28  19 - 32 mEq/L   Glucose, Bld 147 (*) 70 - 99 mg/dL   BUN 27 (*) 6 - 23 mg/dL   Creatinine, Ser 1.61  0.50 - 1.10 mg/dL   Calcium 9.4  8.4 - 09.6 mg/dL   GFR calc non Af Amer 57 (*) >90 mL/min   GFR calc Af Amer 66 (*) >90 mL/min  CARBOXYHEMOGLOBIN     Status: Abnormal   Collection Time    05/12/12  5:30 AM      Result Value Range   Total hemoglobin 13.8  12.0 - 16.0 g/dL   O2 Saturation 04.5     Carboxyhemoglobin 1.7 (*) 0.5 - 1.5 %   Methemoglobin 1.2  0.0 - 1.5 %    Intake/Output Summary (Last 24 hours) at 05/12/12 0851 Last data filed at 05/12/12 0800  Gross per 24 hour  Intake  382.5 ml  Output    201 ml  Net  181.5 ml    ASSESSMENT AND PLAN:  Acute diastolic heart failure:  Currently seems to be euvolemic.  Continue current meds.  Right and left heart cath via left femoral today. Continue current PO Lasix.   CVP 3.  CoOx 62.9.  Mitral regurgitation:  Probable valve repair pending cath results.   Atrial  fibrillation:  Cardioversion hold pending the decision about valve repair.  Continue oral amiodarone.  Resume heparin after cath.    Rollene Rotunda 05/12/2012 8:51 AM

## 2012-05-12 NOTE — Interval H&P Note (Signed)
History and Physical Interval Note:  05/12/2012 11:10 AM  [name] [name]  has presented today for right and left heart cath with the diagnosis of severe MR, CHF. The various methods of treatment have been discussed with the patient and family. After consideration of risks, benefits and other options for treatment, the patient has consented to  Procedure(s): LEFT AND RIGHT HEART CATHETERIZATION WITH CORONARY ANGIOGRAM (N/A) as a surgical intervention .  The patient's history has been reviewed, patient examined, no change in status, stable for surgery.  I have reviewed the patient's chart and labs.  Questions were answered to the patient's satisfaction.     Deb Loudin

## 2012-05-12 NOTE — Progress Notes (Addendum)
ANTICOAGULATION CONSULT NOTE - Follow Up Consult  Pharmacy Consult for heparin, warfarin Indication: atrial fibrillation  Labs:  Recent Labs  05/10/12 0400 05/11/12 0327 05/12/12 0515  HGB 13.0 13.9 13.6  HCT 37.5 39.8 38.6  PLT 218 233 209  LABPROT 14.5 21.0* 15.3*  INR 1.15 1.89* 1.23  HEPARINUNFRC 0.46 0.49 0.72*  CREATININE  --  0.94 0.90    Admit Complaint: 79 yo F admitted 05/06/2012 with SOB/VDRF as a transfer from [facility] where she suffered PEA arrest. Pharmacy consulted to dose heparin and warfarin for afib   Events: s/p cath, eval for possible valve repair, plan to restart heparin after cath, sheath pulled ~ 1245 PM  Assessment: Anticoagulation: afib; Coumadin now on hold. VitK 2mg  IV given 2/16, INR 1.23, CBC stable Plan: 1) Restart heparin at 600 units/hr 8 hrs after sheath pull. 2) Check heparin level 8 hrs after gtt resumed. 3) Daily heparin level and CBC.  Tad Moore, BCPS  Clinical Pharmacist Pager [phone]  05/12/2012 2:00 PM

## 2012-05-12 NOTE — Progress Notes (Signed)
Physical Therapy Treatment Patient Details Name: [name] [name] MRN: [id number] DOB: 10-11-1924 Today's Date: 05/12/2012 Time: 0750-0820 PT Time Calculation (min): 30 min  PT Assessment / Plan / Recommendation Comments on Treatment Session  Pt admitted with cardiac arrest, Afib, MR pt progressing well but continues to require DME use and assist with transfers. Pt encouraged to discuss with dgtr how much assist family can provide at discharge. Pt with bloody urine at [facility] and RN notified. Pt also reports incontinence since admission. Will continue to follow.     Follow Up Recommendations  Supervision - Intermittent     Does the patient have the potential to tolerate intense rehabilitation     Barriers to Discharge        Equipment Recommendations       Recommendations for Other Services    Frequency     Plan Discharge plan remains appropriate;Frequency remains appropriate    Precautions / Restrictions Precautions Precautions: Fall   Pertinent Vitals/Pain No pain HR 102-120 with activity    Mobility  Bed Mobility Bed Mobility: Rolling Left;Left Sidelying to Sit Rolling Left: 5: Supervision;With rail Left Sidelying to Sit: 4: Min assist;HOB flat;With rails Details for Bed Mobility Assistance: cueing for sequence with increased time to complete and assist to elevate trunk from bed Transfers Transfers: Sit to Stand;Stand to Sit;Stand Pivot Transfers Sit to Stand: 4: Min assist;From bed;From chair/3-in-1;4: Min guard Stand to Sit: 4: Min guard Stand Pivot Transfers: 4: Min guard Details for Transfer Assistance: min assist from bed x 1 and minguard from [facility] and bed each x 1, cueing for hand placement and safety with pivot Ambulation/Gait Ambulation/Gait Assistance: 4: Min guard Ambulation Distance (Feet): 500 Feet Assistive device: Rolling walker Ambulation/Gait Assistance Details: cueing for position in RW and directional cues to room Gait Pattern: Step-through pattern;Decreased  stride length Gait velocity: decreased Stairs: No    Exercises General Exercises - Lower Extremity Long Arc Quad: AROM;Both;20 reps;Seated Hip Flexion/Marching: AROM;Both;20 reps;Seated   PT Diagnosis:    PT Problem List:   PT Treatment Interventions:     PT Goals Acute Rehab PT Goals PT Goal: Supine/Side to Sit - Progress: Progressing toward goal PT Goal: Sit to Stand - Progress: Progressing toward goal PT Goal: Stand to Sit - Progress: Progressing toward goal PT Goal: Ambulate - Progress: Progressing toward goal  Visit Information  Last PT Received On: 05/12/12 Assistance Needed: +1    Subjective Data  Subjective: I'm not sure when my dgtr can help me   Cognition  Cognition Overall Cognitive Status: Appears within functional limits for tasks assessed/performed Arousal/Alertness: Awake/alert Orientation Level: Appears intact for tasks assessed Behavior During Session: [facility] for tasks performed    Balance     End of Session PT - End of Session Activity Tolerance: Patient tolerated treatment well Patient left: in chair;with call bell/phone within reach Nurse Communication: Mobility status   GP     Delorse Lek 05/12/2012, 8:27 AM Delaney Meigs, PT [phone]

## 2012-05-12 NOTE — CV Procedure (Signed)
    Cardiac Catheterization Operative Report  [name] [name] [id number] 2/17/201411:16 AM No primary provider on file.  Procedure Performed:  1. Left Heart Catheterization 2. Selective Coronary Angiography 3. Right Heart Catheterization 4. Left ventricular angiogram 5. Distal aortogram  Operator: Verne Carrow, MD  Indication: 79 yo female admitted with CHF, found to have severe MR by TEE with enlarged left atrium, also atrial fibrillation. Plans for possible valve repair. Right and left heart cath today in planning for surgery.                                  Procedure Details: The risks, benefits, complications, treatment options, and expected outcomes were discussed with the patient. The patient and/or family concurred with the proposed plan, giving informed consent. The patient was brought to the cath lab after IV hydration was begun and oral premedication was given. The patient was further sedated with Versed. The leftt groin was prepped and draped in the usual manner. Using the modified Seldinger access technique, a 5 French sheath was placed in the left femoral artery. A 6 French sheath was inserted into the left femoral vein. A multi-purpose catheter was used to perform a right heart catheterization. Standard diagnostic catheters were used to perform selective coronary angiography. A pigtail catheter was used to perform a left ventricular angiogram as well a distal aortogram.  There were no immediate complications. The patient was taken to the recovery area in stable condition.   Hemodynamic Findings: Ao: 128/63                LV: 123/7/9 RA:  5             RV: 36/4/9 PA: 32/16 (mean 24)      PCWP: 11 Fick Cardiac Output: 2.96 L/min Fick Cardiac Index: 1.75 L/min/m2 Central Aortic Saturation: 99% Pulmonary Artery Saturation: 58%  Angiographic Findings:  Left main: No obstructive disease noted.   Left Anterior Descending Artery: Large caliber vessel that courses to  the apex. There is a moderate sized diagonal branch. There is mild plaque in the mid and distal LAD.   Circumflex Artery: Large caliber vessel with three small caliber obtuse marginal branches. There is mild plaque in the proximal circumflex. There is mild plaque in the third obtuse marginal branch.   Right Coronary Artery: Large, dominant vessel with 20% proximal stenosis, mild mid plaque. The PDA is moderate sized with no obstructive disease. The Posterolateral branch has mild plaque disease.   Left Ventricular Angiogram: LVEF=65%. Moderate to severe MR by LVgram.   Distal aortogram: Mild plaque. No aneurysm noted. Mild plaque noted in iliac system.   Impression: 1. Mild non-obstructive CAD 2. Normal LV systolic function 3. Moderate to severe MR by LV gram.  Recommendations: Will continue planning for possible mitral valve repair and MAZE procedure. Will resume heparin drip in 8 hours.        Complications:  None; patient tolerated the procedure well.

## 2012-05-12 NOTE — Consult Note (Signed)
CARDIOTHORACIC SURGERY CONSULTATION REPORT  PCP is Marylen Ponto, MD Referring Provider is BENSIMHON, Bevelyn Buckles, MD Primary Cardiologist is Baldo Daub, MD   Reason for consultation:  Mitral regurgitation and atrial fibrillation  HPI:  Patient is an 79 year old widowed white female from [place] With history of long-standing hypertension, type 2 diabetes mellitus, hyperlipidemia, nonobstructive coronary artery disease, and arthritis. The patient states that recently she has been followed by Dr. Dulce Sellar in [place].  Her husband passed away 3 years ago, in approximately one year later she suffered a syncopal episode complicated by a fall associated with closed head injury that was further complicated by the fact that she was taking Plavix at the time. She was treated for several days in [place] and discharged to a skilled nursing facility for rehabilitation. She recovered from this fairly well although she states that ever since then she has been walking using a cane to steady herself.  She has remained functionally independent and still drives an automobile short distances, but she admits that she lives a fairly sedentary lifestyle and she does not exerts of much physically. Over the last several weeks prior to admission patient developed fairly rapid onset of shortness of breath. She also developed some dizziness and palpitations as well as a dry nonproductive cough. She has never had any chest pain.  Ultimately she was taken to her primary care physician who noted that she was in rapid atrial fibrillation and promptly sent her to the emergency department at [facility] for admission.  There she was noted to have mild pulmonary edema and bilateral pleural effusions. CT angiogram of the chest was notable for the absence of pulmonary embolus and confirmed the presence of pulmonary edema with moderate bilateral pleural effusions.  While in the emergency department she apparently  received intravenous diltiazem and is erythromycin. The patient also was given Benadryl and shortly after developed agonal breathing and became unresponsive. She was promptly resuscitated although she did receive a brief period of CPR. She was intubated and sedated and transported directly to [facility] for further management.  She ruled out for acute myocardial infarction and was treated with rate control and diuretic therapy for rapid atrial fibrillation and class IV congestive heart failure.  She was initially treated with empiric and blotted for possible community-acquired pneumonia, although she has subsequently never revealed any clinical signs suggestive of an infectious process. She rapidly recovered from her acute respiratory arrest was extubated uneventfully. Transthoracic and subsequent transesophageal echocardiograms demonstrate severe mitral regurgitation with normal left ventricular systolic function. Left and right heart catheterization performed earlier today is notable for the absence of significant coronary artery disease. Cardiothoracic surgical consultation has been requested.  Prior to hospitalization the patient reports history of exertional shortness of breath dating back several weeks, culminating in her presentation with acute exacerbation of diastolic congestive heart failure. Patient denies any history of chest pain chest tightness or chest pressure either with activity or at rest. She has had dizzy spells without syncope recently. She denies PND, orthopnea, or lower extremity edema. She has had frequent palpitations.   Past Medical History  Diagnosis Date  . Hypertension   . Hyperlipidemia   . Type 2 diabetes mellitus   . Arthritis   . Coronary artery disease   . Anginal pain   . Shortness of breath     Past Surgical History  Procedure Laterality Date  . Abdominal hysterectomy    . Cholecystectomy    .  Eye surgery    . Appendectomy    . Tee without cardioversion N/A  05/09/2012    Procedure: TRANSESOPHAGEAL ECHOCARDIOGRAM (TEE);  Surgeon: Dolores Patty, MD;  Location[facility] ENDOSCOPY;  Service: Cardiovascular;  Laterality: N/A;    History reviewed. No pertinent family history.  History   Social History  . Marital Status: Married    Spouse Name: N/A    Number of Children: N/A  . Years of Education: N/A   Occupational History  . Not on file.   Social History Main Topics  . Smoking status: Never Smoker   . Smokeless tobacco: Never Used  . Alcohol Use: No  . Drug Use: No  . Sexually Active: No   Other Topics Concern  . Not on file   Social History Narrative  . No narrative on file    Prior to Admission medications   Medication Sig Start Date End Date Taking? Authorizing Provider  amLODipine (NORVASC) 5 MG tablet Take 5 mg by mouth daily.   Yes Historical Provider, MD  citalopram (CELEXA) 10 MG tablet Take 10 mg by mouth daily.   Yes Historical Provider, MD  glimepiride (AMARYL) 1 MG tablet Take 1 mg by mouth daily before breakfast.   Yes Historical Provider, MD  insulin glargine (LANTUS) 100 UNIT/ML injection Inject 18 Units into the skin at bedtime.   Yes Historical Provider, MD  metFORMIN (GLUCOPHAGE) 500 MG tablet Take 500 mg by mouth 2 (two) times daily with a meal.   Yes Historical Provider, MD  nadolol (CORGARD) 40 MG tablet Take 40 mg by mouth daily.   Yes Historical Provider, MD  pravastatin (PRAVACHOL) 40 MG tablet Take 40 mg by mouth daily.   Yes Historical Provider, MD  rOPINIRole (REQUIP) 0.5 MG tablet Take 0.5 mg by mouth at bedtime.   Yes Historical Provider, MD  spironolactone-hydrochlorothiazide (ALDACTAZIDE) 25-25 MG per tablet Take 0.5 tablets by mouth daily.   Yes Historical Provider, MD  traZODone (DESYREL) 50 MG tablet Take 50 mg by mouth at bedtime.   Yes Historical Provider, MD    Current Facility-Administered Medications  Medication Dose Route Frequency Provider Last Rate Last Dose  . 0.9 %  sodium chloride  infusion   Intravenous Continuous Lonia Farber, MD 10 mL/hr at 05/11/12 1400    . acetaminophen (TYLENOL) tablet 650 mg  650 mg Oral Q4H PRN Kathleene Hazel, MD      . albuterol (PROVENTIL) (5 MG/ML) 0.5% nebulizer solution 2.5 mg  2.5 mg Nebulization Q4H PRN Leslye Peer, MD      . amiodarone (PACERONE) tablet 200 mg  200 mg Oral BID Dolores Patty, MD   200 mg at 05/12/12 1028  . antiseptic oral rinse (BIOTENE) solution 15 mL  1 application Mouth Rinse BID Dolores Patty, MD   15 mL at 05/12/12 0800  . furosemide (LASIX) tablet 40 mg  40 mg Oral Daily Vesta Mixer, MD   40 mg at 05/12/12 1405  . heparin ADULT infusion 100 units/mL (25000 units/250 mL)  600 Units/hr Intravenous Continuous Jessica C Carney, PHARMD      . insulin aspart (novoLOG) injection 0-15 Units  0-15 Units Subcutaneous TID WC Dolores Patty, MD   8 Units at 05/12/12 1747  . insulin aspart (novoLOG) injection 0-5 Units  0-5 Units Subcutaneous QHS Leslye Peer, MD      . insulin glargine (LANTUS) injection 18 Units  18 Units Subcutaneous QHS Leslye Peer, MD  18 Units at 05/11/12 2232  . metoprolol tartrate (LOPRESSOR) tablet 25 mg  25 mg Oral BID Wendall Stade, MD   25 mg at 05/12/12 1028  . ondansetron (ZOFRAN) injection 4 mg  4 mg Intravenous Q6H PRN Kathleene Hazel, MD      . pantoprazole (PROTONIX) EC tablet 40 mg  40 mg Oral Daily Leslye Peer, MD   40 mg at 05/12/12 1028  . potassium chloride SA (K-DUR,KLOR-CON) CR tablet 40 mEq  40 mEq Oral Once Dolores Patty, MD        Allergies  Allergen Reactions  . Codeine Other (See Comments)    Unknown       Review of Systems:   General:  decreased appetite, decreased energy, no weight gain, no weight loss, no fever  Cardiac:  no chest pain with exertion, no chest pain at rest, + SOB with minimal exertion, + resting SOB, no PND, no orthopnea, + palpitations, + arrhythmia, + atrial fibrillation, no LE edema, + dizzy  spells, no syncope  Respiratory:  + shortness of breath, no home oxygen, no productive cough, + dry cough, no bronchitis, no wheezing, + 1 episode hemoptysis, no asthma, no pain with inspiration or cough, no sleep apnea, no CPAP at night  GI:   no difficulty swallowing, no reflux, no frequent heartburn, no hiatal hernia, no abdominal pain,  No constipation, no diarrhea, no hematochezia, no hematemesis, no melena  GU:   no dysuria,  no frequency, no urinary tract infection, no hematuria, no kidney stones, no kidney disease  Vascular:  no pain suggestive of claudication, no pain in feet, no leg cramps, no varicose veins, no DVT, no non-healing foot ulcer  Neuro:   no stroke, no TIA's, no seizures, occasional headaches, no temporary blindness one eye,  no slurred speech, no peripheral neuropathy, no chronic pain, mild instability of gait, no memory/cognitive dysfunction  Musculoskeletal: + arthritis particularly both shoulders, no joint swelling, no myalgias, mild difficulty walking - uses a cane, mildly limited mobility   Skin:   + rash, + itching, no skin infections, no pressure sores or ulcerations  Psych:   no anxiety, + depression, no nervousness, no unusual recent stress  Eyes:   no blurry vision, no floaters, no recent vision changes,  wears glasses for reading  ENT:   no hearing loss, no loose or painful teeth, no dentures, last saw dentist within 6 months  Hematologic:  + easy bruising, + abnormal bleeding, ? clotting disorder, no frequent epistaxis  Endocrine:   diabetes, regularly checks CBG's at home     Physical Exam:   BP 111/57  Pulse 100  Temp(Src) 97.5 F (36.4 C) (Oral)  Resp 20  Ht 5\' 2"  (1.575 m)  Wt 67.2 kg (148 lb 2.4 oz)  BMI 27.09 kg/m2  SpO2 97%  General:  Elderly and somewhat frail-appearing  HEENT:  Unremarkable   Neck:   no JVD, no bruits, no adenopathy   Chest:   clear to auscultation, symmetrical breath sounds, no wheezes, no rhonchi   CV:   Irregular rate and  rhythm, II/VI systolic murmur   Abdomen:  soft, non-tender, no masses   Extremities:  warm, well-perfused, pulses palpable in groin  Rectal/GU  Deferred  Neuro:   Grossly non-focal and symmetrical throughout  Skin:   Clean and dry, no rashes, no breakdown, frail-appearing, multiple bruises esp right forearm  Diagnostic Tests:  Transthoracic Echocardiography  Patient: Westyn, Keatley MR #: [id number] Study Date:  05/08/2012 Gender: F Age: 89 Height: 157.5cm Weight: 69.4kg BSA: 1.54m^2 Pt. Status: Room: 2302  Layla Maw PERFORMING [place] SONOGRAPHER [facility], RDCS cc:  ------------------------------------------------------------ LV EF: 60% - 65%  ------------------------------------------------------------ Indications: Mitral regurgitation 424.0.  ------------------------------------------------------------ History: PMH: Atrial fibrillation. PMH: Sudden death episode. Risk factors: Diabetes mellitus.  ------------------------------------------------------------ Study Conclusions  - Left ventricle: The cavity size was normal. Systolic function was normal. The estimated ejection fraction was in the range of 60% to 65%. Wall motion was normal; there were no regional wall motion abnormalities. The study is not technically sufficient to allow evaluation of LV diastolic function. - Aortic valve: There was no stenosis. Mild regurgitation. - Mitral valve: Moderately calcified annulus. Moderate regurgitation. - Left atrium: The atrium was moderately dilated. - Right ventricle: The cavity size was normal. Systolic function was normal. - Right atrium: The atrium was moderately dilated. - Pulmonary arteries: PA peak pressure: 38mm Hg (S). - Inferior vena cava: The vessel was normal in size; the respirophasic diameter changes were in the normal range (= 50%); findings are consistent with normal central venous pressure. Impressions:  - The patient  was in atrial fibrillation. Normal LV size and systolic function, EF 60-65%. Moderate biatrial enlargement. Normal RV size and systolic function. Moderate MR. Transthoracic echocardiography. M-mode, limited 2D, limited spectral Doppler, and color Doppler. Height: Height: 157.5cm. Height: 62in. Weight: Weight: 69.4kg. Weight: 152.7lb. Body mass index: BMI: 28kg/m^2. Body surface area: BSA: 1.57m^2. Blood pressure: 123/54. Patient status: Inpatient. Location: ICU/CCU  ------------------------------------------------------------  ------------------------------------------------------------ Left ventricle: The cavity size was normal. Systolic function was normal. The estimated ejection fraction was in the range of 60% to 65%. Wall motion was normal; there were no regional wall motion abnormalities. The study is not technically sufficient to allow evaluation of LV diastolic function.  ------------------------------------------------------------ Aortic valve: Trileaflet; mildly calcified leaflets. Doppler: There was no stenosis. Mild regurgitation.  ------------------------------------------------------------ Mitral valve: Moderately calcified annulus. Doppler: There was no evidence for stenosis. Moderate regurgitation.  ------------------------------------------------------------ Left atrium: The atrium was moderately dilated.  ------------------------------------------------------------ Right ventricle: The cavity size was normal. Systolic function was normal.  ------------------------------------------------------------ Tricuspid valve: Doppler: Mild regurgitation.  ------------------------------------------------------------ Right atrium: The atrium was moderately dilated.  ------------------------------------------------------------ Pericardium: There was no pericardial effusion.  ------------------------------------------------------------ Systemic veins: Inferior vena  cava: The vessel was normal in size; the respirophasic diameter changes were in the normal range (= 50%); findings are consistent with normal central venous pressure.  ------------------------------------------------------------  Doppler measurements Normal Main pulmonary artery Pressure, S 38 mm =30 Hg  ------------------------------------------------------------ Prepared and Electronically Authenticated by  Marca Ancona 2014-02-13T17:[id number]       TRANSESOPHAGEAL ECHOCARDIOGRAM  NAME: Katarzyna Wolven MRN: [id number]  DOB: Dec 20, 1924 ADMIT DATE: 05/06/2012  INDICATIONS: AF with RVR, Mitral regurgitation  PROCEDURE:  Informed consent was obtained prior to the procedure. The risks, benefits and alternatives for the procedure were discussed and the patient comprehended these risks. Risks include, but are not limited to, cough, sore throat, vomiting, nausea, somnolence, esophageal and stomach trauma or perforation, bleeding, low blood pressure, aspiration, pneumonia, infection, trauma to the teeth and death.  After a procedural time-out, the patient was given 3 mg versed and 50 mcg fentanyl for moderate sedation. The oropharynx was anesthetized with cetacaine spray. The transesophageal probe was inserted in the esophagus and stomach without difficulty and multiple views were obtained.  FINDINGS:  LEFT VENTRICLE: EF = 60%. Normal size. No regional wall motion abnormalities. Mild LVH.   RIGHT VENTRICLE: Normal  LEFT ATRIUM: Moderately  dilated  LEFT ATRIAL APPENDAGE: No clot  RIGHT ATRIUM: Normal  AORTIC VALVE: Trileaflet. Mildly thickened. Mild to moderate AI. No AS.  MITRAL VALVE: Flat closure. Severe central MR with 2 jets. Cannot exclude small defect in anterior leaflet. + flow reversal in pulmonary veins  TRICUSPID VALVE: Normal. Mild TR  PULMONIC VALVE: Trivial TR  INTERATRIAL SEPTUM: No PFO/ASD  PERICARDIUM: No effusion  DESCENDING AORTA: Severe plaque  Etiology of severe MR  remains unclear to me as valve does not look that morphologically unusual. Given severity of MR, we did not cardiovert as she may need surgical evaluation and pre-op cath. Will review with Dr. Cornelius Moras. Continue heparin.  Images reviewed with Dr. Tenny Craw at time of procedure.  Truman Hayward  11:14 AM  Cardiac Catheterization Operative Report  Altovise Wahler  [id number]  2/17/201411:16 AM  No primary provider on file.  Procedure Performed:  1. Left Heart Catheterization 2. Selective Coronary Angiography 3. Right Heart Catheterization 4. Left ventricular angiogram 5. Distal aortogram Operator: Verne Carrow, MD  Indication: 79 yo female admitted with CHF, found to have severe MR by TEE with enlarged left atrium, also atrial fibrillation. Plans for possible valve repair. Right and left heart cath today in planning for surgery.  Procedure Details:  The risks, benefits, complications, treatment options, and expected outcomes were discussed with the patient. The patient and/or family concurred with the proposed plan, giving informed consent. The patient was brought to the cath lab after IV hydration was begun and oral premedication was given. The patient was further sedated with Versed. The leftt groin was prepped and draped in the usual manner. Using the modified Seldinger access technique, a 5 French sheath was placed in the left femoral artery. A 6 French sheath was inserted into the left femoral vein. A multi-purpose catheter was used to perform a right heart catheterization. Standard diagnostic catheters were used to perform selective coronary angiography. A pigtail catheter was used to perform a left ventricular angiogram as well a distal aortogram. There were no immediate complications. The patient was taken to the recovery area in stable condition.  Hemodynamic Findings:  Ao: 128/63  LV: 123/7/9  RA: 5  RV: 36/4/9  PA: 32/16 (mean 24)  PCWP: 11  Fick Cardiac Output: 2.96 L/min  Fick  Cardiac Index: 1.75 L/min/m2  Central Aortic Saturation: 99%  Pulmonary Artery Saturation: 58%  Angiographic Findings:  Left main: No obstructive disease noted.  Left Anterior Descending Artery: Large caliber vessel that courses to the apex. There is a moderate sized diagonal branch. There is mild plaque in the mid and distal LAD.  Circumflex Artery: Large caliber vessel with three small caliber obtuse marginal branches. There is mild plaque in the proximal circumflex. There is mild plaque in the third obtuse marginal branch.  Right Coronary Artery: Large, dominant vessel with 20% proximal stenosis, mild mid plaque. The PDA is moderate sized with no obstructive disease. The Posterolateral branch has mild plaque disease.  Left Ventricular Angiogram: LVEF=65%. Moderate to severe MR by LVgram.  Distal aortogram: Mild plaque. No aneurysm noted. Mild plaque noted in iliac system.  Impression:  1. Mild non-obstructive CAD  2. Normal LV systolic function  3. Moderate to severe MR by LV gram.  Recommendations: Will continue planning for possible mitral valve repair and MAZE procedure. Will resume heparin drip in 8 hours.  Complications: None; patient tolerated the procedure well.       Impression:  Patient has severe symptomatic mitral regurgitation with persistent atrial fibrillation  causing acute exacerbation of diastolic congestive heart failure in the setting of likely longstanding hypertension.  Left ventricular systolic function appears fairly well preserved and the patient does not have significant coronary artery disease. Transesophageal echocardiogram confirmed what appears to be primarily type I dysfunction of the mitral valve as a result of pure annular dilatation.  Under the circumstances the patient may do reasonably well with mitral valve repair and Maze procedure.  Risks of surgery will be somewhat increased because of the patient's advanced age, frailty, and mildly limited physical  mobility.   Plan:  The rationale for elective mitral valve repair surgery has been explained, including a comparison between surgery and continued medical therapy with close follow-up.  The likelihood of successful and durable valve repair has been discussed with particular reference to the findings of their recent echocardiogram.  Based upon these findings and previous experience, I have quoted them a greater than 90 percent likelihood of successful valve repair.  In the unlikely event that their valve cannot be successfully repaired, we discussed the possibility of replacing the mitral valve using a mechanical prosthesis with the attendant need for long-term anticoagulation versus the alternative of replacing it using a bioprosthetic tissue valve with its potential for late structural valve deterioration and failure, depending upon the patient's longevity.  The patient specifically requests that if the mitral valve must be replaced that it be done using a bioprosthetic tissue valve.  Alternative surgical approaches have been discussed, including a comparison between conventional sternotomy and minimally-invasive techniques.  The relative risks and benefits of each have been reviewed as they pertain to the patient's specific circumstances, and all of their questions have been addressed.  We will tentatively plan to proceed with surgery on Thursday this week. I agree with continuing amiodarone therapy and heparin. I would not restart Coumadin unless there is some reason to delay surgery for a prolonged period time. I spent an extended period of time at the bedside with the patient and her daughter. All their questions been addressed.      Salvatore Decent. Cornelius Moras, MD 05/12/2012 8:32 PM  I spent in excess of 90 minutes of time directly involved in the conduct of this consultation.

## 2012-05-12 NOTE — Progress Notes (Signed)
ANTICOAGULATION CONSULT NOTE - Follow Up Consult  Pharmacy Consult for heparin Indication: atrial fibrillation  Labs:  Recent Labs  05/10/12 0400 05/11/12 0327 05/12/12 0515  HGB 13.0 13.9 13.6  HCT 37.5 39.8 38.6  PLT 218 233 209  LABPROT 14.5 21.0* 15.3*  INR 1.15 1.89* 1.23  HEPARINUNFRC 0.46 0.49 0.72*  CREATININE  --  0.94  --     Assessment: 79yo female now slightly supratherapeutic on heparin.  Not yet on cath schedule.  Goal of Therapy:  Heparin level 0.3-0.7 units/ml   Plan:  Will decrease heparin gtt slightly to 600 units/hr and check level in 8hr.  Colleen Can PharmD BCPS 05/12/2012,6:00 AM

## 2012-05-12 NOTE — H&P (View-Only) (Signed)
  SUBJECTIVE:  She is breathing at baseline. She denies any chest pain.  She ambulated without difficulty.    PHYSICAL EXAM Filed Vitals:   05/12/12 0354 05/12/12 0500 05/12/12 0804 05/12/12 0823  BP: 120/77  148/82   Pulse:   110 120  Temp: 97.5 F (36.4 C)  98.1 F (36.7 C)   TempSrc: Oral  Oral   Resp: 20     Height:      Weight:  148 lb 2.4 oz (67.2 kg)    SpO2: 95%  98% 97%   General:  No distress Lungs:  Clear Heart:  Irregular Abdomen:  Positive bowel sounds, no rebound no guarding Extremities:  No edema  LABS: Lab Results  Component Value Date   TROPONINI <0.30 05/08/2012   Results for orders placed during the hospital encounter of 05/06/12 (from the past 24 hour(s))  GLUCOSE, CAPILLARY     Status: Abnormal   Collection Time    05/11/12 11:48 AM      Result Value Range   Glucose-Capillary 259 (*) 70 - 99 mg/dL   Comment 1 Notify RN    URINALYSIS, ROUTINE W REFLEX MICROSCOPIC     Status: Abnormal   Collection Time    05/11/12 12:38 PM      Result Value Range   Color, Urine RED (*) YELLOW   APPearance CLOUDY (*) CLEAR   Specific Gravity, Urine 1.009  1.005 - 1.030   pH 7.0  5.0 - 8.0   Glucose, UA NEGATIVE  NEGATIVE mg/dL   Hgb urine dipstick LARGE (*) NEGATIVE   Bilirubin Urine NEGATIVE  NEGATIVE   Ketones, ur NEGATIVE  NEGATIVE mg/dL   Protein, ur NEGATIVE  NEGATIVE mg/dL   Urobilinogen, UA 0.2  0.0 - 1.0 mg/dL   Nitrite NEGATIVE  NEGATIVE   Leukocytes, UA LARGE (*) NEGATIVE  URINE MICROSCOPIC-ADD ON     Status: None   Collection Time    05/11/12 12:38 PM      Result Value Range   Squamous Epithelial / LPF RARE  RARE   WBC, UA 7-10  <3 WBC/hpf   RBC / HPF TOO NUMEROUS TO COUNT  <3 RBC/hpf  GLUCOSE, CAPILLARY     Status: Abnormal   Collection Time    05/11/12  5:00 PM      Result Value Range   Glucose-Capillary 149 (*) 70 - 99 mg/dL   Comment 1 Notify RN    GLUCOSE, CAPILLARY     Status: Abnormal   Collection Time    05/11/12 10:02 PM   Result Value Range   Glucose-Capillary 187 (*) 70 - 99 mg/dL  PROTIME-INR     Status: Abnormal   Collection Time    05/12/12  5:15 AM      Result Value Range   Prothrombin Time 15.3 (*) 11.6 - 15.2 seconds   INR 1.23  0.00 - 1.49  CBC     Status: None   Collection Time    05/12/12  5:15 AM      Result Value Range   WBC 8.1  4.0 - 10.5 K/uL   RBC 4.32  3.87 - 5.11 MIL/uL   Hemoglobin 13.6  12.0 - 15.0 g/dL   HCT 38.6  36.0 - 46.0 %   MCV 89.4  78.0 - 100.0 fL   MCH 31.5  26.0 - 34.0 pg   MCHC 35.2  30.0 - 36.0 g/dL   RDW 12.9  11.5 - 15.5 %   Platelets 209    150 - 400 K/uL  HEPARIN LEVEL (UNFRACTIONATED)     Status: Abnormal   Collection Time    05/12/12  5:15 AM      Result Value Range   Heparin Unfractionated 0.72 (*) 0.30 - 0.70 IU/mL  BASIC METABOLIC PANEL     Status: Abnormal   Collection Time    05/12/12  5:15 AM      Result Value Range   Sodium 138  135 - 145 mEq/L   Potassium 3.8  3.5 - 5.1 mEq/L   Chloride 102  96 - 112 mEq/L   CO2 28  19 - 32 mEq/L   Glucose, Bld 147 (*) 70 - 99 mg/dL   BUN 27 (*) 6 - 23 mg/dL   Creatinine, Ser 0.90  0.50 - 1.10 mg/dL   Calcium 9.4  8.4 - 10.5 mg/dL   GFR calc non Af Amer 57 (*) >90 mL/min   GFR calc Af Amer 66 (*) >90 mL/min  CARBOXYHEMOGLOBIN     Status: Abnormal   Collection Time    05/12/12  5:30 AM      Result Value Range   Total hemoglobin 13.8  12.0 - 16.0 g/dL   O2 Saturation 62.9     Carboxyhemoglobin 1.7 (*) 0.5 - 1.5 %   Methemoglobin 1.2  0.0 - 1.5 %    Intake/Output Summary (Last 24 hours) at 05/12/12 0851 Last data filed at 05/12/12 0800  Gross per 24 hour  Intake  382.5 ml  Output    201 ml  Net  181.5 ml    ASSESSMENT AND PLAN:  Acute diastolic heart failure:  Currently seems to be euvolemic.  Continue current meds.  Right and left heart cath via left femoral today. Continue current PO Lasix.   CVP 3.  CoOx 62.9.  Mitral regurgitation:  Probable valve repair pending cath results.   Atrial  fibrillation:  Cardioversion hold pending the decision about valve repair.  Continue oral amiodarone.  Resume heparin after cath.    [name] [name] 05/12/2012 8:51 AM   

## 2012-05-13 ENCOUNTER — Inpatient Hospital Stay (HOSPITAL_COMMUNITY): Payer: Medicare Other

## 2012-05-13 ENCOUNTER — Other Ambulatory Visit: Payer: Self-pay

## 2012-05-13 ENCOUNTER — Other Ambulatory Visit (HOSPITAL_COMMUNITY): Payer: Self-pay | Admitting: Radiology

## 2012-05-13 DIAGNOSIS — I059 Rheumatic mitral valve disease, unspecified: Secondary | ICD-10-CM

## 2012-05-13 DIAGNOSIS — Z01810 Encounter for preprocedural cardiovascular examination: Secondary | ICD-10-CM

## 2012-05-13 DIAGNOSIS — I4891 Unspecified atrial fibrillation: Secondary | ICD-10-CM

## 2012-05-13 LAB — CBC
HCT: 38.9 % (ref 36.0–46.0)
MCH: 30.9 pg (ref 26.0–34.0)
MCHC: 35 g/dL (ref 30.0–36.0)
MCV: 88.4 fL (ref 78.0–100.0)
RDW: 13 % (ref 11.5–15.5)

## 2012-05-13 LAB — GLUCOSE, CAPILLARY: Glucose-Capillary: 145 mg/dL — ABNORMAL HIGH (ref 70–99)

## 2012-05-13 LAB — COMPREHENSIVE METABOLIC PANEL
ALT: 36 U/L — ABNORMAL HIGH (ref 0–35)
AST: 18 U/L (ref 0–37)
Albumin: 3.3 g/dL — ABNORMAL LOW (ref 3.5–5.2)
CO2: 25 mEq/L (ref 19–32)
Calcium: 9.6 mg/dL (ref 8.4–10.5)
Chloride: 102 mEq/L (ref 96–112)
GFR calc non Af Amer: 46 mL/min — ABNORMAL LOW (ref >90)
Sodium: 138 mEq/L (ref 135–145)
Total Bilirubin: 0.8 mg/dL (ref 0.3–1.2)

## 2012-05-13 LAB — HEPARIN LEVEL (UNFRACTIONATED): Heparin Unfractionated: 0.48 IU/mL (ref 0.30–0.70)

## 2012-05-13 LAB — HEMOGLOBIN A1C
Hgb A1c MFr Bld: 7.4 % — ABNORMAL HIGH (ref <5.7)
Mean Plasma Glucose: 166 mg/dL — ABNORMAL HIGH (ref <117)

## 2012-05-13 LAB — PROTIME-INR: Prothrombin Time: 14.5 seconds (ref 11.6–15.2)

## 2012-05-13 MED ORDER — ZOLPIDEM TARTRATE 5 MG PO TABS
5.0000 mg | ORAL_TABLET | Freq: Every evening | ORAL | Status: DC | PRN
  Administered 2012-05-13 – 2012-05-18 (×6): 5 mg via ORAL
  Filled 2012-05-13 (×7): qty 1

## 2012-05-13 NOTE — Progress Notes (Signed)
PFT completed. Unconfirmed results placed in Progress Notes of [facility]. 

## 2012-05-13 NOTE — Progress Notes (Addendum)
VASCULAR LAB PRELIMINARY  PRELIMINARY  PRELIMINARY  PRELIMINARY  Pre-op Cardiac Surgery  Carotid Findings:  Bilateral:  No evidence of hemodynamically significant internal carotid artery stenosis.   Vertebral artery flow is antegrade.      Upper Extremity Right Left  Brachial Pressures 118 107  Radial Waveforms Tri Tri  Ulnar Waveforms Tri Tri  Palmar Arch (Allen's Test) Decreases >50% with radial compression, normal with ulnar compression Decreases >50% with radial compression, normal with ulnar compression      Farrel Demark, RDMS, RVT CESTONE, HELENE, RVT 05/13/2012, 2:11 PM

## 2012-05-13 NOTE — Progress Notes (Signed)
   CARDIOTHORACIC SURGERY PROGRESS NOTE  1 Day Post-Op  S/P Procedure(s) (LRB): LEFT AND RIGHT HEART CATHETERIZATION WITH CORONARY ANGIOGRAM (N/A)  Subjective: Feels pretty well.  Breathing improved.  Objective: Vital signs in last 24 hours: Temp:  [97.4 F (36.3 C)-98.3 F (36.8 C)] 97.4 F (36.3 C) (02/18 1527) Pulse Rate:  [91-110] 97 (02/18 1527) Cardiac Rhythm:  [-] Atrial fibrillation (02/18 0730) Resp:  [18] 18 (02/18 1527) BP: (107-132)/(57-73) 119/61 mmHg (02/18 1527) SpO2:  [94 %-99 %] 99 % (02/18 1527) Weight:  [66.679 kg (147 lb)] 66.679 kg (147 lb) (02/18 0708)  Physical Exam:  Rhythm:   Afib  Breath sounds: clear  Heart sounds:  irreg  Incisions:  n/a  Abdomen:  soft  Extremities:  warm   Intake/Output from previous day: 02/17 0701 - 02/18 0700 In: 838.1 [P.O.:440; I.V.:398.1] Out: 50 [Urine:50] Intake/Output this shift: Total I/O In: 808 [P.O.:680; I.V.:128] Out: 150 [Urine:150]  Lab Results:  Recent Labs  05/12/12 0515 05/13/12 0535  WBC 8.1 11.0*  HGB 13.6 13.6  HCT 38.6 38.9  PLT 209 210   BMET:  Recent Labs  05/12/12 0515 05/13/12 0535  NA 138 138  K 3.8 3.6  CL 102 102  CO2 28 25  GLUCOSE 147* 152*  BUN 27* 32*  CREATININE 0.90 1.07  CALCIUM 9.4 9.6    CBG (last 3)   Recent Labs  05/13/12 0704 05/13/12 1116 05/13/12 1614  GLUCAP 145* 180* 220*   PT/INR:   Recent Labs  05/13/12 0535  LABPROT 14.5  INR 1.15    CXR:  *RADIOLOGY REPORT*  Clinical Data: Follow-up CHF and effusions.  CHEST - 2 VIEW  Comparison: 05/08/2012  Findings: Cardiomegaly. On prior bilateral effusions have  resolved. No pleural effusions currently. No focal airspace  opacities or edema. No acute bony abnormality. Left central line  remains in place, unchanged.  IMPRESSION:  Cardiomegaly. Resolution of the previously seen effusion. No  focal opacities currently.  Original Report Authenticated By: Charlett Nose,  M.D.   Assessment/Plan: S/P Procedure(s) (LRB): LEFT AND RIGHT HEART CATHETERIZATION WITH CORONARY ANGIOGRAM (N/A)  Clinically stable CXR clear w/ resolution of edema and effusions Tentatively for OR Thursday  Sarah Zerby H 05/13/2012 6:34 PM

## 2012-05-13 NOTE — Progress Notes (Signed)
PT Cancellation Note  Patient Details Name: [name] [name] MRN: [id number] DOB: 01/24/1925   Cancelled Treatment:    Reason Eval/Treat Not Completed: Other (comment) (pt out of room)   Delaney Meigs, PT [phone]

## 2012-05-13 NOTE — Progress Notes (Signed)
ANTICOAGULATION CONSULT NOTE - Follow Up Consult  Pharmacy Consult for heparin Indication: atrial fibrillation   Labs:  Recent Labs  05/11/12 0327 05/12/12 0515 05/13/12 0535  HGB 13.9 13.6 13.6  HCT 39.8 38.6 38.9  PLT 233 209 210  LABPROT 21.0* 15.3* 14.5  INR 1.89* 1.23 1.15  HEPARINUNFRC 0.49 0.72* 0.53  CREATININE 0.94 0.90  --     Assessment/Plan:  79yo female therapeutic on heparin after resumed post-cath.  Will continue gtt at current rate and confirm stable with additional level.  Colleen Can PharmD BCPS 05/13/2012,6:19 AM

## 2012-05-13 NOTE — Progress Notes (Signed)
ANTICOAGULATION CONSULT NOTE - Follow Up Consult  Pharmacy Consult for Heparin Indication: atrial fibrillation  Patient Measurements: Height: 5\' 2"  (157.5 cm) Weight: 147 lb (66.679 kg) (b scale) IBW/kg (Calculated) : 50.1  Recent Labs  05/11/12 0327 05/12/12 0515 05/13/12 0535 05/13/12 1354  HGB 13.9 13.6 13.6  --   HCT 39.8 38.6 38.9  --   PLT 233 209 210  --   LABPROT 21.0* 15.3* 14.5  --   INR 1.89* 1.23 1.15  --   HEPARINUNFRC 0.49 0.72* 0.53 0.48  CREATININE 0.94 0.90 1.07  --    Estimated Creatinine Clearance: 34.4 ml/min (by C-G formula based on Cr of 1.07).  Assessment: 87yoF now s/p right and left heart cath being continued on IV heparin for atrial fibrillation.Heparin level remains therapeutic at 0.48. Chronic coumadin on hold (last dose 2/15; INR today 1.15) in anticipation of Maze with MVR repair likely planned for Thursday. CBC stable, no bleeding noted in chart.   Goal of Therapy:  Heparin level 0.3-0.7 units/ml Monitor platelets by anticoagulation protocol: Yes   Plan:  1. Continue IV heparin at 600 units/hr 2. Follow-up daily heparin level and CBC 3. Monitor signs/symptoms of bleeding  Benjaman Pott, PharmD, BCPS 05/13/2012   3:01 PM

## 2012-05-13 NOTE — Progress Notes (Signed)
SUBJECTIVE:  She is breathing at baseline. She denies any chest pain.  She did not sleep last night.   PHYSICAL EXAM Filed Vitals:   05/12/12 1800 05/12/12 1900 05/12/12 2000 05/12/12 2314  BP: 118/67 111/57 132/67 107/60  Pulse:    91  Temp:   98.3 F (36.8 C) 98.1 F (36.7 C)  TempSrc:   Oral Oral  Resp:    18  Height:      Weight:      SpO2:    94%   General:  No distress Lungs:  Clear Heart:  Irregular Abdomen:  Positive bowel sounds, no rebound no guarding Extremities:  No edema, bruising with slight hematoma on the left leg. No bruit.  LABS: Lab Results  Component Value Date   TROPONINI <0.30 05/08/2012   Results for orders placed during the hospital encounter of 05/06/12 (from the past 24 hour(s))  GLUCOSE, CAPILLARY     Status: Abnormal   Collection Time    05/12/12  8:03 AM      Result Value Range   Glucose-Capillary 141 (*) 70 - 99 mg/dL  POCT I-STAT 3, BLOOD GAS (G3+)     Status: Abnormal   Collection Time    05/12/12 11:38 AM      Result Value Range   pH, Arterial 7.503 (*) 7.350 - 7.450   pCO2 arterial 27.6 (*) 35.0 - 45.0 mmHg   pO2, Arterial 116.0 (*) 80.0 - 100.0 mmHg   Bicarbonate 21.7  20.0 - 24.0 mEq/L   TCO2 22  0 - 100 mmol/L   O2 Saturation 99.0     Sample type ARTERIAL    POCT I-STAT 3, BLOOD GAS (G3P V)     Status: Abnormal   Collection Time    05/12/12 11:52 AM      Result Value Range   pH, Ven 7.371 (*) 7.250 - 7.300   pCO2, Ven 42.0 (*) 45.0 - 50.0 mmHg   pO2, Ven 31.0  30.0 - 45.0 mmHg   Bicarbonate 24.4 (*) 20.0 - 24.0 mEq/L   TCO2 26  0 - 100 mmol/L   O2 Saturation 58.0     Acid-base deficit 1.0  0.0 - 2.0 mmol/L   Sample type VENOUS     Comment NOTIFIED PHYSICIAN    GLUCOSE, CAPILLARY     Status: Abnormal   Collection Time    05/12/12 12:28 PM      Result Value Range   Glucose-Capillary 107 (*) 70 - 99 mg/dL  POCT ACTIVATED CLOTTING TIME     Status: None   Collection Time    05/12/12 12:28 PM      Result Value Range     Activated Clotting Time 138    GLUCOSE, CAPILLARY     Status: Abnormal   Collection Time    05/12/12  4:17 PM      Result Value Range   Glucose-Capillary 280 (*) 70 - 99 mg/dL  GLUCOSE, CAPILLARY     Status: Abnormal   Collection Time    05/12/12  9:02 PM      Result Value Range   Glucose-Capillary 127 (*) 70 - 99 mg/dL   Comment 1 Notify RN     Comment 2 Documented in Chart    URINALYSIS, ROUTINE W REFLEX MICROSCOPIC     Status: Abnormal   Collection Time    05/12/12  9:29 PM      Result Value Range   Color, Urine YELLOW  YELLOW  APPearance CLOUDY (*) CLEAR   Specific Gravity, Urine 1.020  1.005 - 1.030   pH 6.0  5.0 - 8.0   Glucose, UA NEGATIVE  NEGATIVE mg/dL   Hgb urine dipstick MODERATE (*) NEGATIVE   Bilirubin Urine NEGATIVE  NEGATIVE   Ketones, ur NEGATIVE  NEGATIVE mg/dL   Protein, ur NEGATIVE  NEGATIVE mg/dL   Urobilinogen, UA 0.2  0.0 - 1.0 mg/dL   Nitrite NEGATIVE  NEGATIVE   Leukocytes, UA SMALL (*) NEGATIVE  URINE MICROSCOPIC-ADD ON     Status: Abnormal   Collection Time    05/12/12  9:29 PM      Result Value Range   Squamous Epithelial / LPF FEW (*) RARE   WBC, UA 0-2  <3 WBC/hpf   RBC / HPF 21-50  <3 RBC/hpf  PROTIME-INR     Status: None   Collection Time    05/13/12  5:35 AM      Result Value Range   Prothrombin Time 14.5  11.6 - 15.2 seconds   INR 1.15  0.00 - 1.49  CBC     Status: Abnormal   Collection Time    05/13/12  5:35 AM      Result Value Range   WBC 11.0 (*) 4.0 - 10.5 K/uL   RBC 4.40  3.87 - 5.11 MIL/uL   Hemoglobin 13.6  12.0 - 15.0 g/dL   HCT 45.4  09.8 - 11.9 %   MCV 88.4  78.0 - 100.0 fL   MCH 30.9  26.0 - 34.0 pg   MCHC 35.0  30.0 - 36.0 g/dL   RDW 14.7  82.9 - 56.2 %   Platelets 210  150 - 400 K/uL  HEPARIN LEVEL (UNFRACTIONATED)     Status: None   Collection Time    05/13/12  5:35 AM      Result Value Range   Heparin Unfractionated 0.53  0.30 - 0.70 IU/mL  COMPREHENSIVE METABOLIC PANEL     Status: Abnormal   Collection  Time    05/13/12  5:35 AM      Result Value Range   Sodium 138  135 - 145 mEq/L   Potassium 3.6  3.5 - 5.1 mEq/L   Chloride 102  96 - 112 mEq/L   CO2 25  19 - 32 mEq/L   Glucose, Bld 152 (*) 70 - 99 mg/dL   BUN 32 (*) 6 - 23 mg/dL   Creatinine, Ser 1.30  0.50 - 1.10 mg/dL   Calcium 9.6  8.4 - 86.5 mg/dL   Total Protein 6.1  6.0 - 8.3 g/dL   Albumin 3.3 (*) 3.5 - 5.2 g/dL   AST 18  0 - 37 U/L   ALT 36 (*) 0 - 35 U/L   Alkaline Phosphatase 89  39 - 117 U/L   Total Bilirubin 0.8  0.3 - 1.2 mg/dL   GFR calc non Af Amer 46 (*) >90 mL/min   GFR calc Af Amer 53 (*) >90 mL/min  PRO B NATRIURETIC PEPTIDE     Status: Abnormal   Collection Time    05/13/12  5:35 AM      Result Value Range   Pro B Natriuretic peptide (BNP) 1125.0 (*) 0 - 450 pg/mL    Intake/Output Summary (Last 24 hours) at 05/13/12 0655 Last data filed at 05/12/12 2200  Gross per 24 hour  Intake 694.13 ml  Output     50 ml  Net 644.13 ml    ASSESSMENT AND PLAN:  Acute diastolic heart failure:   Euvolemic.  Continue current therapy.  Mitral regurgitation:  Right and left heart cath yesterday.  Valve repair on Thursday per Dr. Cornelius Moras.   Atrial fibrillation:  Rate OK.  Plan Maze with MV repair.  Heparin resumed.    Rollene Rotunda 05/13/2012 6:55 AM

## 2012-05-13 NOTE — Progress Notes (Signed)
1400-1450 Discussed progression of activity after surgery. Discussed importance of IS and walking. Gave pt's daughter video information to watch preop video. Assisted pt to bathroom and back to bed. Did not walk with pt since she just walked with PT.Tore Carreker DunlapRN

## 2012-05-13 NOTE — Progress Notes (Signed)
NUTRITION FOLLOW UP  Intervention:   No nutrition interventions at this time.  RD will continue to follow  Nutrition Dx:   Inadequate oral intake related to inability to eat as evidenced by NPO status. Resolved    Goal:   EN goal no longer applicable  New Goal: PO intake to meet >/=90% estimated nutrition needs. Likely met   Monitor:   PO intake, weight trends, i/o's, labs   Assessment:   2/12 extubated 2/14 TEE 2/17 L and R cath Planned for valve replacement on Thursday of this week.   Reports appetite is improving.    Height: Ht Readings from Last 1 Encounters:  05/08/12 5\' 2"  (1.575 m)    Weight Status:   Wt Readings from Last 1 Encounters:  05/13/12 147 lb (66.679 kg)  Weight trending down (N-2.3 L this admission)  Re-estimated needs:  Kcal: 1300-1500 Protein: 70-80 gm  Fluid: 1.3-1.5 L/day  Skin: intact   Diet Order: Carb Control PO intake-50-75%  Intake/Output Summary (Last 24 hours) at 05/13/12 1028 Last data filed at 05/13/12 1914  Gross per 24 hour  Intake 1068.13 ml  Output      0 ml  Net 1068.13 ml    Last BM: 2/16   Labs:   Recent Labs Lab 05/07/12 0400 05/08/12 0330  05/11/12 0327 05/12/12 0515 05/13/12 0535  NA 138 140  < > 135 138 138  K 3.4* 3.2*  < > 3.1* 3.8 3.6  CL 101 99  < > 98 102 102  CO2 26 29  < > 30 28 25   BUN 46* 38*  < > 27* 27* 32*  CREATININE 1.21* 1.16*  < > 0.94 0.90 1.07  CALCIUM 8.7 9.4  < > 9.3 9.4 9.6  MG 1.8 1.8  --   --   --   --   PHOS 4.3 3.9  --   --   --   --   GLUCOSE 118* 138*  < > 143* 147* 152*  < > = values in this interval not displayed.  CBG (last 3)   Recent Labs  05/12/12 1617 05/12/12 2102 05/13/12 0704  GLUCAP 280* 127* 145*    Scheduled Meds: . amiodarone  200 mg Oral BID  . antiseptic oral rinse  1 application Mouth Rinse BID  . furosemide  40 mg Oral Daily  . insulin aspart  0-15 Units Subcutaneous TID WC  . insulin aspart  0-5 Units Subcutaneous QHS  . insulin  glargine  18 Units Subcutaneous QHS  . metoprolol tartrate  25 mg Oral BID  . pantoprazole  40 mg Oral Daily  . potassium chloride  40 mEq Oral Once    Continuous Infusions: . sodium chloride 10 mL/hr at 05/12/12 2052  . heparin 600 Units/hr (05/12/12 2052)    Clarene Duke RD, LDN Pager [phone] After Hours pager [phone]

## 2012-05-13 NOTE — Progress Notes (Signed)
Physical Therapy Treatment Patient Details Name: Shannel Zahm MRN: [id number] DOB: 03-15-1925 Today's Date: 05/13/2012 Time: 1341-1400 PT Time Calculation (min): 19 min  PT Assessment / Plan / Recommendation Comments on Treatment Session  Pt admitted with cardiac arrest, Afib, MR who continues to progress well with activity despite being fatigued from multiple tests and procedures today. Will follow after MVR to further assess pt function and need for assist. HEP deferred secondary to staff arrival for another test.     Follow Up Recommendations  Home health PT;Supervision - Intermittent     Does the patient have the potential to tolerate intense rehabilitation     Barriers to Discharge        Equipment Recommendations       Recommendations for Other Services    Frequency     Plan Discharge plan remains appropriate;Frequency remains appropriate    Precautions / Restrictions Precautions Precautions: Fall   Pertinent Vitals/Pain No pain HR 104 with gait    Mobility  Bed Mobility Bed Mobility: Supine to Sit;Sit to Supine;Scooting to HOB Supine to Sit: 6: Modified independent (Device/Increase time);HOB flat Sit to Supine: 6: Modified independent (Device/Increase time);HOB flat Scooting to HOB: 3: Mod assist Transfers Sit to Stand: 5: Supervision;From bed Stand to Sit: 5: Supervision;To bed Details for Transfer Assistance: cueing for safety as pt slightly unsteady on initial standing. Returned to bed for test Ambulation/Gait Ambulation/Gait Assistance: 4: Min guard Ambulation Distance (Feet): 700 Feet Assistive device: None Ambulation/Gait Assistance Details: 3 partial LOB during gait without RW and typically uses cane as well as 3 standing rest breaks with activity Gait Pattern: Decreased stride length;Step-through pattern Gait velocity: decreased Stairs: No    Exercises     PT Diagnosis:    PT Problem List:   PT Treatment Interventions:     PT Goals Acute Rehab PT  Goals PT Goal: Supine/Side to Sit - Progress: Met PT Goal: Sit to Supine/Side - Progress: Met PT Goal: Sit to Stand - Progress: Progressing toward goal PT Goal: Stand to Sit - Progress: Progressing toward goal PT Goal: Ambulate - Progress: Progressing toward goal  Visit Information  Last PT Received On: 05/13/12 Assistance Needed: +1 Reason Eval/Treat Not Completed: Other (comment) (pt out of room)    Subjective Data  Subjective: I didn't like that respiratory test at all   Cognition  Cognition Overall Cognitive Status: Appears within functional limits for tasks assessed/performed Arousal/Alertness: Awake/alert Orientation Level: Appears intact for tasks assessed Behavior During Session: [facility] for tasks performed    Balance     End of Session PT - End of Session Equipment Utilized During Treatment: Gait belt Activity Tolerance: Patient tolerated treatment well Patient left: in bed;with call bell/phone within reach;with family/visitor present   GP     Toney Sang Beth 05/13/2012, 2:07 PM Delaney Meigs, PT [phone]

## 2012-05-14 DIAGNOSIS — Z01810 Encounter for preprocedural cardiovascular examination: Secondary | ICD-10-CM

## 2012-05-14 DIAGNOSIS — I059 Rheumatic mitral valve disease, unspecified: Secondary | ICD-10-CM

## 2012-05-14 LAB — CARBOXYHEMOGLOBIN
Methemoglobin: 0.8 % (ref 0.0–1.5)
Total hemoglobin: 12.6 g/dL (ref 12.0–16.0)

## 2012-05-14 LAB — GLUCOSE, CAPILLARY
Glucose-Capillary: 143 mg/dL — ABNORMAL HIGH (ref 70–99)
Glucose-Capillary: 139 mg/dL — ABNORMAL HIGH (ref 70–99)

## 2012-05-14 LAB — CBC
HCT: 36.9 % (ref 36.0–46.0)
MCH: 30.4 pg (ref 26.0–34.0)
MCV: 88.3 fL (ref 78.0–100.0)
Platelets: 213 10*3/uL (ref 150–400)
RBC: 4.18 MIL/uL (ref 3.87–5.11)

## 2012-05-14 LAB — SURGICAL PCR SCREEN: MRSA, PCR: NEGATIVE

## 2012-05-14 MED ORDER — MAGNESIUM SULFATE 50 % IJ SOLN
40.0000 meq | INTRAMUSCULAR | Status: AC
  Filled 2012-05-14: qty 10

## 2012-05-14 MED ORDER — DEXTROSE 5 % IV SOLN
30.0000 ug/min | INTRAVENOUS | Status: AC
  Filled 2012-05-14: qty 2

## 2012-05-14 MED ORDER — SODIUM CHLORIDE 0.9 % IJ SOLN
10.0000 mL | INTRAMUSCULAR | Status: DC | PRN
  Administered 2012-05-14 – 2012-05-16 (×5): 20 mL
  Administered 2012-05-17 – 2012-05-18 (×13): 10 mL
  Administered 2012-05-19: 30 mL
  Administered 2012-05-19: 10 mL
  Administered 2012-05-20: 30 mL

## 2012-05-14 MED ORDER — DEXMEDETOMIDINE HCL IN NACL 400 MCG/100ML IV SOLN
0.1000 ug/kg/h | INTRAVENOUS | Status: AC
  Filled 2012-05-14: qty 100

## 2012-05-14 MED ORDER — POTASSIUM CHLORIDE 2 MEQ/ML IV SOLN
80.0000 meq | INTRAVENOUS | Status: AC
  Filled 2012-05-14: qty 40

## 2012-05-14 MED ORDER — METOPROLOL TARTRATE 12.5 MG HALF TABLET
12.5000 mg | ORAL_TABLET | Freq: Once | ORAL | Status: AC
  Administered 2012-05-15: 12.5 mg via ORAL
  Filled 2012-05-14: qty 1

## 2012-05-14 MED ORDER — VANCOMYCIN HCL 10 G IV SOLR
1250.0000 mg | INTRAVENOUS | Status: AC
  Filled 2012-05-14: qty 1250

## 2012-05-14 MED ORDER — NITROGLYCERIN IN D5W 200-5 MCG/ML-% IV SOLN
2.0000 ug/min | INTRAVENOUS | Status: AC
  Filled 2012-05-14: qty 250

## 2012-05-14 MED ORDER — SODIUM CHLORIDE 0.9 % IV SOLN
INTRAVENOUS | Status: AC
  Filled 2012-05-14: qty 40

## 2012-05-14 MED ORDER — CHLORHEXIDINE GLUCONATE 4 % EX LIQD
60.0000 mL | Freq: Once | CUTANEOUS | Status: AC
  Administered 2012-05-15: 4 via TOPICAL
  Filled 2012-05-14 (×2): qty 60

## 2012-05-14 MED ORDER — INSULIN REGULAR HUMAN 100 UNIT/ML IJ SOLN
INTRAMUSCULAR | Status: AC
  Filled 2012-05-14: qty 1

## 2012-05-14 MED ORDER — SODIUM CHLORIDE 0.9 % IJ SOLN: 10.0000 mL | Freq: Two times a day (BID) | INTRAMUSCULAR | Status: DC

## 2012-05-14 MED ORDER — DEXTROSE 5 % IV SOLN
1.5000 g | INTRAVENOUS | Status: AC
  Filled 2012-05-14: qty 1.5

## 2012-05-14 MED ORDER — EPINEPHRINE HCL 1 MG/ML IJ SOLN
0.5000 ug/min | INTRAVENOUS | Status: AC
  Filled 2012-05-14: qty 4

## 2012-05-14 MED ORDER — BISACODYL 5 MG PO TBEC
5.0000 mg | DELAYED_RELEASE_TABLET | Freq: Once | ORAL | Status: AC
  Administered 2012-05-14: 5 mg via ORAL
  Filled 2012-05-14: qty 1

## 2012-05-14 MED ORDER — VERAPAMIL HCL 2.5 MG/ML IV SOLN
INTRAVENOUS | Status: AC
  Filled 2012-05-14: qty 2.5

## 2012-05-14 MED ORDER — DOPAMINE-DEXTROSE 3.2-5 MG/ML-% IV SOLN
2.0000 ug/kg/min | INTRAVENOUS | Status: AC
  Filled 2012-05-14: qty 250

## 2012-05-14 MED ORDER — DEXTROSE 5 % IV SOLN
750.0000 mg | INTRAVENOUS | Status: AC
  Filled 2012-05-14: qty 750

## 2012-05-14 NOTE — Progress Notes (Signed)
CSW called by patient's nurse- [name] that patient was requesting a notary today.  CSW met with patient, her son [name] and his wife. Patient states that she wanted to sign her Living Will and Health Care Power of [address]. She had read the document and initialed the appropriate places on the documents that expressed her wishes.  Both documents were reviewed with patient and she was able to verbalize a clear understanding of both documents and their contents.  Patient and her son denied any questions or concerns.  Both documents were signed in front of 2 witnesses; the documents were notarized.  Copy placed on the chart; original returned to patient/son and several copies were made to distribute as needed. Patient and son were very appreciative;  Notified patient's nurse- Troyce of above.  CSW signing off for now. Patient relates that she will have a procedure in the a.m.   [name] [name]. [name]  [phone]

## 2012-05-14 NOTE — Progress Notes (Signed)
   CARDIOTHORACIC SURGERY PROGRESS NOTE  2 Days Post-Op  S/P Procedure(s) (LRB): LEFT AND RIGHT HEART CATHETERIZATION WITH CORONARY ANGIOGRAM (N/A)  Subjective: Feels well  Objective: Vital signs in last 24 hours: Temp:  [97.6 F (36.4 C)-97.8 F (36.6 C)] 97.8 F (36.6 C) (02/19 1500) Pulse Rate:  [94-115] 94 (02/19 1500) Cardiac Rhythm:  [-] Atrial fibrillation (02/19 0800) Resp:  [20] 20 (02/19 1500) BP: (102-132)/(69-79) 102/69 mmHg (02/19 1500) SpO2:  [96 %-100 %] 96 % (02/19 1500) Weight:  [66.9 kg (147 lb 7.8 oz)] 66.9 kg (147 lb 7.8 oz) (02/19 1610)  Physical Exam:  Rhythm:   Afib  Breath sounds: clear  Heart sounds:  irreg  Incisions:  n/a  Abdomen:  soft  Extremities:  warm   Intake/Output from previous day: 02/18 0701 - 02/19 0700 In: 1126 [P.O.:920; I.V.:206] Out: 570 [Urine:570] Intake/Output this shift: Total I/O In: 440 [P.O.:440] Out: 300 [Urine:300]  Lab Results:  Recent Labs  05/13/12 0535 05/14/12 0517  WBC 11.0* 10.8*  HGB 13.6 12.7  HCT 38.9 36.9  PLT 210 213   BMET:  Recent Labs  05/12/12 0515 05/13/12 0535  NA 138 138  K 3.8 3.6  CL 102 102  CO2 28 25  GLUCOSE 147* 152*  BUN 27* 32*  CREATININE 0.90 1.07  CALCIUM 9.4 9.6    CBG (last 3)   Recent Labs  05/14/12 0603 05/14/12 1112 05/14/12 1552  GLUCAP 143* 139* 173*   PT/INR:   Recent Labs  05/14/12 0517  LABPROT 14.5  INR 1.15    CXR:  *RADIOLOGY REPORT*  Clinical Data: Follow-up CHF and effusions.  CHEST - 2 VIEW  Comparison: 05/08/2012  Findings: Cardiomegaly. On prior bilateral effusions have  resolved. No pleural effusions currently. No focal airspace  opacities or edema. No acute bony abnormality. Left central line  remains in place, unchanged.  IMPRESSION:  Cardiomegaly. Resolution of the previously seen effusion. No  focal opacities currently.  Original Report Authenticated By: Charlett Nose, M.D.   Assessment/Plan: S/P Procedure(s)  (LRB): LEFT AND RIGHT HEART CATHETERIZATION WITH CORONARY ANGIOGRAM (N/A)  I have again reviewed the indications, risks, and potential benefits of surgery with Ms. Cobbins.  She understands and accepts all potential associated risks of surgery including but not limited to risk of death, stroke, myocardial infarction, congestive heart failure, respiratory failure, renal failure, bleeding requiring blood transfusion and/or reexploration, arrhythmia, heart block or bradycardia requiring permanent pacemaker, pneumonia, pleural effusion, wound infection, pulmonary embolus or other thromboembolic complication, chronic pain or other delayed complications including the late recurrence of valvular heart disease, atrial fibrillation, and/or congestive heart failure.  All questions answered.  For OR tomorrow.   [name],[name] 05/14/2012

## 2012-05-14 NOTE — Progress Notes (Signed)
SUBJECTIVE:  She is breathing at baseline. She denies any chest pain.  She took Ambien and slept last night.   PHYSICAL EXAM Filed Vitals:   05/13/12 1001 05/13/12 1527 05/13/12 2108 05/14/12 0605  BP: 111/73 119/61 120/79 128/69  Pulse: 110 97 110 115  Temp: 97.5 F (36.4 C) 97.4 F (36.3 C) 97.7 F (36.5 C) 97.6 F (36.4 C)  TempSrc: Oral Oral Oral Oral  Resp:  18 20 20   Height:      Weight:    147 lb 7.8 oz (66.9 kg)  SpO2:  99% 100% 97%   General:  No distress Lungs:  Clear Heart:  Irregular Abdomen:  Positive bowel sounds, no rebound no guarding Extremities:  No edema, bruising with slight hematoma on the left leg. No bruit. No pulsatile mass  LABS:  Results for orders placed during the hospital encounter of 05/06/12 (from the past 24 hour(s))  GLUCOSE, CAPILLARY     Status: Abnormal   Collection Time    05/13/12  7:04 AM      Result Value Range   Glucose-Capillary 145 (*) 70 - 99 mg/dL  GLUCOSE, CAPILLARY     Status: Abnormal   Collection Time    05/13/12 11:16 AM      Result Value Range   Glucose-Capillary 180 (*) 70 - 99 mg/dL   Comment 1 Notify RN    HEPARIN LEVEL (UNFRACTIONATED)     Status: None   Collection Time    05/13/12  1:54 PM      Result Value Range   Heparin Unfractionated 0.48  0.30 - 0.70 IU/mL  GLUCOSE, CAPILLARY     Status: Abnormal   Collection Time    05/13/12  4:14 PM      Result Value Range   Glucose-Capillary 220 (*) 70 - 99 mg/dL   Comment 1 Notify RN    GLUCOSE, CAPILLARY     Status: Abnormal   Collection Time    05/13/12 10:04 PM      Result Value Range   Glucose-Capillary 226 (*) 70 - 99 mg/dL   Comment 1 Notify RN    CARBOXYHEMOGLOBIN     Status: None   Collection Time    05/14/12  5:00 AM      Result Value Range   Total hemoglobin 12.6  12.0 - 16.0 g/dL   O2 Saturation 16.1     Carboxyhemoglobin 1.2  0.5 - 1.5 %   Methemoglobin 0.8  0.0 - 1.5 %  PROTIME-INR     Status: None   Collection Time    05/14/12  5:17  AM      Result Value Range   Prothrombin Time 14.5  11.6 - 15.2 seconds   INR 1.15  0.00 - 1.49  CBC     Status: Abnormal   Collection Time    05/14/12  5:17 AM      Result Value Range   WBC 10.8 (*) 4.0 - 10.5 K/uL   RBC 4.18  3.87 - 5.11 MIL/uL   Hemoglobin 12.7  12.0 - 15.0 g/dL   HCT 09.6  04.5 - 40.9 %   MCV 88.3  78.0 - 100.0 fL   MCH 30.4  26.0 - 34.0 pg   MCHC 34.4  30.0 - 36.0 g/dL   RDW 81.1  91.4 - 78.2 %   Platelets 213  150 - 400 K/uL  HEPARIN LEVEL (UNFRACTIONATED)     Status: None   Collection Time  05/14/12  5:17 AM      Result Value Range   Heparin Unfractionated 0.56  0.30 - 0.70 IU/mL  GLUCOSE, CAPILLARY     Status: Abnormal   Collection Time    05/14/12  6:03 AM      Result Value Range   Glucose-Capillary 143 (*) 70 - 99 mg/dL   Comment 1 Notify RN      Intake/Output Summary (Last 24 hours) at 05/14/12 0654 Last data filed at 05/13/12 2245  Gross per 24 hour  Intake    952 ml  Output    370 ml  Net    582 ml    ASSESSMENT AND PLAN:  Acute diastolic heart failure:   Euvolemic.  Continue current therapy.  Continue Lasix 40 mg daily.   Mitral regurgitation:  Right and left heart cath Monday.  Valve repair on Thursday per Dr. Cornelius Moras.   Atrial fibrillation:  Rate OK.  Plan Maze with MV repair.  Heparin resumed. Continuing amiodarone.   Diabetes:  Continue current therapy.    Fayrene Fearing [facility] 05/14/2012 6:54 AM

## 2012-05-14 NOTE — Progress Notes (Signed)
CARDIAC REHAB PHASE I   PRE:  Rate/Rhythm: 97 afib    BP: sitting 102/69    SaO2: 96 RA  MODE:  Ambulation: 400 ft   POST:  Rate/Rhythm: 97 afib    BP: sitting 125/77     SaO2: 100 RA  STeady with RW. x1 rest toward end to "catch breath". To recliner. Pt practiced IS and reminders given to practice tonight. Will f/u post op. [id number]  [name] [name] CES, ACSM

## 2012-05-14 NOTE — Progress Notes (Signed)
ANTICOAGULATION CONSULT NOTE - Follow Up Consult  Pharmacy Consult for Heparin Indication: atrial fibrillation  Patient Measurements: Height: 5\' 2"  (157.5 cm) Weight: 147 lb 7.8 oz (66.9 kg) IBW/kg (Calculated) : 50.1  Recent Labs  05/12/12 0515 05/13/12 0535 05/13/12 1354 05/14/12 0517  HGB 13.6 13.6  --  12.7  HCT 38.6 38.9  --  36.9  PLT 209 210  --  213  LABPROT 15.3* 14.5  --  14.5  INR 1.23 1.15  --  1.15  HEPARINUNFRC 0.72* 0.53 0.48 0.56  CREATININE 0.90 1.07  --   --    Estimated Creatinine Clearance: 34.5 ml/min (by C-G formula based on Cr of 1.07).  Assessment: 87yoF s/p right and left heart cath being continued on IV heparin for atrial fibrillation. Chronic coumadin on hold (last dose 2/15; INR 1.15) in anticipation of Maze with MVR repair planned for Thursday. Hgb 12.7, Plts 213. RN denies any overt bleeding currently and no bleeding noted by MD in chart.    Goal of Therapy:  Heparin level 0.3-0.7 units/ml Monitor platelets by anticoagulation protocol: Yes   Plan:  1. Continue IV heparin at 600 units/hr (will be stopped for surgery tomorrow) 2. Follow-up daily heparin level and CBC 3. Monitor signs/symptoms of bleeding 4. Follow-up anticoagulation plans post-op    Benjaman Pott, PharmD, BCPS 05/14/2012   1:47 PM

## 2012-05-15 ENCOUNTER — Encounter (HOSPITAL_COMMUNITY): Payer: Self-pay | Admitting: Certified Registered"

## 2012-05-15 ENCOUNTER — Encounter (HOSPITAL_COMMUNITY)
Admission: RE | Disposition: A | Discharge status: Skilled Nursing Facility | Payer: Self-pay | Source: Other Acute Inpatient Hospital | Attending: Internal Medicine

## 2012-05-15 DIAGNOSIS — T148XXA Other injury of unspecified body region, initial encounter: Secondary | ICD-10-CM | POA: Diagnosis not present

## 2012-05-15 DIAGNOSIS — I4891 Unspecified atrial fibrillation: Secondary | ICD-10-CM

## 2012-05-15 DIAGNOSIS — I059 Rheumatic mitral valve disease, unspecified: Secondary | ICD-10-CM

## 2012-05-15 DIAGNOSIS — I724 Aneurysm of artery of lower extremity: Secondary | ICD-10-CM

## 2012-05-15 LAB — GLUCOSE, CAPILLARY
Glucose-Capillary: 153 mg/dL — ABNORMAL HIGH (ref 70–99)
Glucose-Capillary: 163 mg/dL — ABNORMAL HIGH (ref 70–99)
Glucose-Capillary: 177 mg/dL — ABNORMAL HIGH (ref 70–99)

## 2012-05-15 LAB — CBC
HCT: 33.2 % — ABNORMAL LOW (ref 36.0–46.0)
Hemoglobin: 11.7 g/dL — ABNORMAL LOW (ref 12.0–15.0)
MCV: 88.8 fL (ref 78.0–100.0)
RDW: 13.3 % (ref 11.5–15.5)
WBC: 11.8 10*3/uL — ABNORMAL HIGH (ref 4.0–10.5)

## 2012-05-15 LAB — BASIC METABOLIC PANEL
BUN: 41 mg/dL — ABNORMAL HIGH (ref 6–23)
Chloride: 103 mEq/L (ref 96–112)
Creatinine, Ser: 1.15 mg/dL — ABNORMAL HIGH (ref 0.50–1.10)
GFR calc Af Amer: 49 mL/min — ABNORMAL LOW (ref >90)
Glucose, Bld: 147 mg/dL — ABNORMAL HIGH (ref 70–99)

## 2012-05-15 LAB — APTT: aPTT: 27 seconds (ref 24–37)

## 2012-05-15 SURGERY — CANCELLED PROCEDURE
Site: Chest | Laterality: Right

## 2012-05-15 MED ORDER — MIDAZOLAM HCL 5 MG/5ML IJ SOLN
INTRAMUSCULAR | Status: AC | PRN
  Administered 2012-05-15: 0.5 mg via INTRAVENOUS

## 2012-05-15 MED ORDER — FENTANYL CITRATE 0.05 MG/ML IJ SOLN
INTRAMUSCULAR | Status: AC | PRN
  Administered 2012-05-15: 50 ug via INTRAVENOUS

## 2012-05-15 SURGICAL SUPPLY — 142 items
ADAPTER CARDIO PERF ANTE/RETRO (ADAPTER) ×3 IMPLANT
ATTRACTOMAT 16X20 MAGNETIC DRP (DRAPES) ×3 IMPLANT
BAG DECANTER FOR FLEXI CONT (MISCELLANEOUS) ×3 IMPLANT
BENZOIN TINCTURE PRP APPL 2/3 (GAUZE/BANDAGES/DRESSINGS) ×3 IMPLANT
BLADE STERNUM SYSTEM 6 (BLADE) ×3 IMPLANT
BLADE SURG 11 STRL SS (BLADE) ×3 IMPLANT
CANISTER SUCTION 2500CC (MISCELLANEOUS) ×6 IMPLANT
CANN PRFSN 3/8X14X24FR PCFC (MISCELLANEOUS)
CANN PRFSN 3/8XCNCT ST RT ANG (MISCELLANEOUS)
CANNULA FEM VENOUS REMOTE 22FR (CANNULA) IMPLANT
CANNULA FEMORAL ART 14 SM (MISCELLANEOUS) ×3 IMPLANT
CANNULA GUNDRY RCSP 15FR (MISCELLANEOUS) ×3 IMPLANT
CANNULA OPTISITE PERFUSION 16F (CANNULA) IMPLANT
CANNULA OPTISITE PERFUSION 18F (CANNULA) IMPLANT
CANNULA PRFSN 3/8X14X24FR PCFC (MISCELLANEOUS) IMPLANT
CANNULA PRFSN 3/8XCNCT RT ANG (MISCELLANEOUS) IMPLANT
CANNULA VEN MTL TIP RT (MISCELLANEOUS)
CARDIAC SUCTION (MISCELLANEOUS) ×3 IMPLANT
CATH KIT ON Q 5IN SLV (PAIN MANAGEMENT) IMPLANT
CATH THORACIC 28FR RT ANG (CATHETERS) IMPLANT
CATH THORACIC 36FR (CATHETERS) IMPLANT
CLAMP ISOLATOR SYNERGY LG (MISCELLANEOUS) ×3 IMPLANT
CLIP FOGARTY SPRING 6M (CLIP) IMPLANT
CLOTH BEACON ORANGE TIMEOUT ST (SAFETY) ×3 IMPLANT
CONN 1/2X1/2X1/2  BEN (MISCELLANEOUS) ×1
CONN 1/2X1/2X1/2 BEN (MISCELLANEOUS) ×2 IMPLANT
CONN 3/8X1/2 ST GISH (MISCELLANEOUS) ×6 IMPLANT
CONN ST 1/4X3/8  BEN (MISCELLANEOUS) ×2
CONN ST 1/4X3/8 BEN (MISCELLANEOUS) ×4 IMPLANT
CONT SPEC STER OR (MISCELLANEOUS) ×3 IMPLANT
COVER MAYO STAND STRL (DRAPES) ×3 IMPLANT
COVER PROBE W GEL 5X96 (DRAPES) ×3 IMPLANT
COVER SURGICAL LIGHT HANDLE (MISCELLANEOUS) ×6 IMPLANT
CRADLE DONUT ADULT HEAD (MISCELLANEOUS) ×3 IMPLANT
DERMABOND ADVANCED (GAUZE/BANDAGES/DRESSINGS) ×2
DERMABOND ADVANCED .7 DNX12 (GAUZE/BANDAGES/DRESSINGS) ×4 IMPLANT
DEVICE PMI PUNCTURE CLOSURE (MISCELLANEOUS) ×3 IMPLANT
DEVICE TROCAR PUNCTURE CLOSURE (ENDOMECHANICALS) ×3 IMPLANT
DRAIN CHANNEL 28F RND 3/8 FF (WOUND CARE) ×6 IMPLANT
DRAIN CHANNEL 32F RND 10.7 FF (WOUND CARE) ×3 IMPLANT
DRAPE BILATERAL SPLIT (DRAPES) ×3 IMPLANT
DRAPE C-ARM 42X72 X-RAY (DRAPES) ×3 IMPLANT
DRAPE CV SPLIT W-CLR ANES SCRN (DRAPES) ×3 IMPLANT
DRAPE INCISE IOBAN 66X45 STRL (DRAPES) ×6 IMPLANT
DRAPE SLUSH MACHINE 52X66 (DRAPES) IMPLANT
DRAPE SLUSH/WARMER DISC (DRAPES) ×3 IMPLANT
DRSG COVADERM 4X14 (GAUZE/BANDAGES/DRESSINGS) ×3 IMPLANT
DRSG COVADERM 4X8 (GAUZE/BANDAGES/DRESSINGS) ×3 IMPLANT
ELECT BLADE 6.5 EXT (BLADE) ×3 IMPLANT
ELECT REM PT RETURN 9FT ADLT (ELECTROSURGICAL) ×6
ELECTRODE REM PT RTRN 9FT ADLT (ELECTROSURGICAL) ×4 IMPLANT
FEMORAL VENOUS CANN RAP (CANNULA) IMPLANT
GLOVE BIO SURGEON STRL SZ 6 (GLOVE) IMPLANT
GLOVE BIO SURGEON STRL SZ 6.5 (GLOVE) IMPLANT
GLOVE BIO SURGEON STRL SZ7 (GLOVE) IMPLANT
GLOVE BIO SURGEON STRL SZ7.5 (GLOVE) IMPLANT
GLOVE ORTHO TXT STRL SZ7.5 (GLOVE) ×9 IMPLANT
GOWN STRL NON-REIN LRG LVL3 (GOWN DISPOSABLE) ×12 IMPLANT
GUIDEWIRE ANG ZIPWIRE 038X150 (WIRE) ×3 IMPLANT
HEMOSTAT POWDER SURGIFOAM 1G (HEMOSTASIS) ×9 IMPLANT
INSERT CONFORM CROSS CLAMP 66M (MISCELLANEOUS) ×3 IMPLANT
INSERT CONFORM CROSS CLAMP 86M (MISCELLANEOUS) ×3 IMPLANT
INSERT FOGARTY XLG (MISCELLANEOUS) ×3 IMPLANT
KIT BASIN OR (CUSTOM PROCEDURE TRAY) ×3 IMPLANT
KIT DILATOR VASC 18G NDL (KITS) ×3 IMPLANT
KIT ROOM TURNOVER OR (KITS) ×3 IMPLANT
KIT SUCTION CATH 14FR (SUCTIONS) ×9 IMPLANT
LEAD PACING MYOCARDI (MISCELLANEOUS) ×3 IMPLANT
LOOP VESSEL SUPERMAXI WHITE (MISCELLANEOUS) ×3 IMPLANT
NEEDLE AORTIC ROOT 14G 7F (CATHETERS) ×3 IMPLANT
NS IRRIG 1000ML POUR BTL (IV SOLUTION) ×15 IMPLANT
PACK OPEN HEART (CUSTOM PROCEDURE TRAY) ×3 IMPLANT
PAD ARMBOARD 7.5X6 YLW CONV (MISCELLANEOUS) ×6 IMPLANT
PAD ELECT DEFIB RADIOL ZOLL (MISCELLANEOUS) ×3 IMPLANT
PROBE CRYO2-ABLATION MALLABLE (MISCELLANEOUS) IMPLANT
RETRACTOR TRL SOFT TISSUE LG (INSTRUMENTS) IMPLANT
RETRACTOR TRM SOFT TISSUE 7.5 (INSTRUMENTS) IMPLANT
SET CANNULATION TOURNIQUET (MISCELLANEOUS) ×3 IMPLANT
SET IRRIG TUBING LAPAROSCOPIC (IRRIGATION / IRRIGATOR) ×3 IMPLANT
SOLUTION ANTI FOG 6CC (MISCELLANEOUS) ×3 IMPLANT
SPONGE GAUZE 4X4 12PLY (GAUZE/BANDAGES/DRESSINGS) ×6 IMPLANT
SUCKER INTRACARDIAC WEIGHTED (SUCKER) ×3 IMPLANT
SUCKER WEIGHTED FLEX (MISCELLANEOUS) ×6 IMPLANT
SUT BONE WAX W31G (SUTURE) ×3 IMPLANT
SUT E-PACK MINIMALLY INVASIVE (SUTURE) ×3 IMPLANT
SUT ETHIBOND (SUTURE) IMPLANT
SUT ETHIBOND 2 0 SH (SUTURE) ×2 IMPLANT
SUT ETHIBOND 2 0 SH 36X2 (SUTURE) ×4 IMPLANT
SUT ETHIBOND 2 0 V4 (SUTURE) IMPLANT
SUT ETHIBOND 2 0V4 GREEN (SUTURE) IMPLANT
SUT ETHIBOND 2-0 RB-1 WHT (SUTURE) IMPLANT
SUT ETHIBOND 4 0 TF (SUTURE) IMPLANT
SUT ETHIBOND 5 0 C 1 30 (SUTURE) IMPLANT
SUT ETHIBOND NAB MH 2-0 36IN (SUTURE) IMPLANT
SUT ETHIBOND X763 2 0 SH 1 (SUTURE) ×6 IMPLANT
SUT GORETEX 6.0 TH-9 30 IN (SUTURE) IMPLANT
SUT GORETEX CV 4 TH 22 36 (SUTURE) ×3 IMPLANT
SUT GORETEX CV-5THC-13 36IN (SUTURE) IMPLANT
SUT GORETEX CV4 TH-18 (SUTURE) ×6 IMPLANT
SUT GORETEX TH-18 36 INCH (SUTURE) IMPLANT
SUT MNCRL AB 3-0 PS2 18 (SUTURE) ×6 IMPLANT
SUT PDS AB 1 CTX 36 (SUTURE) ×6 IMPLANT
SUT PROLENE 3 0 SH 1 (SUTURE) ×3 IMPLANT
SUT PROLENE 3 0 SH DA (SUTURE) IMPLANT
SUT PROLENE 3 0 SH1 36 (SUTURE) ×12 IMPLANT
SUT PROLENE 4 0 RB 1 (SUTURE) ×2
SUT PROLENE 4 0 SH DA (SUTURE) ×6 IMPLANT
SUT PROLENE 4-0 RB1 .5 CRCL 36 (SUTURE) ×4 IMPLANT
SUT PROLENE 5 0 C 1 36 (SUTURE) IMPLANT
SUT PROLENE 6 0 C 1 30 (SUTURE) IMPLANT
SUT SILK  1 MH (SUTURE) ×1
SUT SILK 1 MH (SUTURE) ×2 IMPLANT
SUT SILK 1 TIES 10X30 (SUTURE) IMPLANT
SUT SILK 2 0 SH CR/8 (SUTURE) IMPLANT
SUT SILK 2 0 TIES 10X30 (SUTURE) IMPLANT
SUT SILK 2 0SH CR/8 30 (SUTURE) IMPLANT
SUT SILK 3 0 (SUTURE)
SUT SILK 3 0 SH CR/8 (SUTURE) IMPLANT
SUT SILK 3 0SH CR/8 30 (SUTURE) IMPLANT
SUT SILK 3-0 18XBRD TIE 12 (SUTURE) IMPLANT
SUT STEEL 6MS V (SUTURE) IMPLANT
SUT STEEL STERNAL CCS#1 18IN (SUTURE) IMPLANT
SUT STEEL SZ 6 DBL 3X14 BALL (SUTURE) IMPLANT
SUT TEM PAC WIRE 2 0 SH (SUTURE) IMPLANT
SUT VIC AB 2-0 CTX 27 (SUTURE) IMPLANT
SUT VIC AB 2-0 CTX 36 (SUTURE) IMPLANT
SUT VIC AB 2-0 UR6 27 (SUTURE) IMPLANT
SUT VIC AB 3-0 SH 8-18 (SUTURE) IMPLANT
SUT VICRYL 2 TP 1 (SUTURE) IMPLANT
SYRINGE 10CC LL (SYRINGE) ×3 IMPLANT
SYS ATRICLIP LAA EXCLUSION 45 (CLIP) IMPLANT
SYSTEM SAHARA CHEST DRAIN ATS (WOUND CARE) ×3 IMPLANT
TOWEL OR 17X24 6PK STRL BLUE (TOWEL DISPOSABLE) ×6 IMPLANT
TOWEL OR 17X26 10 PK STRL BLUE (TOWEL DISPOSABLE) ×6 IMPLANT
TRAY FOLEY IC TEMP SENS 14FR (CATHETERS) ×3 IMPLANT
TROCAR XCEL BLADELESS 5X75MML (TROCAR) ×3 IMPLANT
TROCAR XCEL NON-BLD 11X100MML (ENDOMECHANICALS) ×6 IMPLANT
TUBE SUCT INTRACARD DLP 20F (MISCELLANEOUS) ×3 IMPLANT
TUNNELER SHEATH ON-Q 11GX8 (MISCELLANEOUS) IMPLANT
UNDERPAD 30X30 INCONTINENT (UNDERPADS AND DIAPERS) ×3 IMPLANT
WATER STERILE IRR 1000ML POUR (IV SOLUTION) ×6 IMPLANT
WIRE BENTSON .035X145CM (WIRE) ×3 IMPLANT

## 2012-05-15 NOTE — Anesthesia Preprocedure Evaluation (Addendum)
Anesthesia Evaluation  Patient identified by MRN, date of birth, ID band Patient awake    Reviewed: Allergy & Precautions, H&P , NPO status , Patient's Chart, lab work & pertinent test results, reviewed documented beta blocker date and time   Airway Mallampati: II TM Distance: >3 FB Neck ROM: full    Dental  (+) Teeth Intact   Pulmonary shortness of breath,  breath sounds clear to auscultation        Cardiovascular hypertension, + dysrhythmias Atrial Fibrillation + Valvular Problems/Murmurs MR Rhythm:Irregular     Neuro/Psych    GI/Hepatic   Endo/Other  diabetes, Type 2  Renal/GU      Musculoskeletal   Abdominal (+)  Abdomen: soft. Bowel sounds: normal.  Peds  Hematology   Anesthesia Other Findings Large tender firm hematoma noted in left groin from cath site.  Dr. Noreene Larsson and Dr. Cornelius Moras notified.  Case cancelled secondary to hematoma in left groin per Dr. Cornelius Moras.  Carlynn Herald, CRNA  Reproductive/Obstetrics                       Anesthesia Physical Anesthesia Plan  ASA: III  Anesthesia Plan: General   Post-op Pain Management:    Induction: Intravenous  Airway Management Planned: Oral ETT and Double Lumen EBT  Additional Equipment: Arterial line, CVP, PA Cath, TEE, 3D TEE and Ultrasound Guidance Line Placement  Intra-op Plan:   Post-operative Plan: Post-operative intubation/ventilation  Informed Consent: I have reviewed the patients History and Physical, chart, labs and discussed the procedure including the risks, benefits and alternatives for the proposed anesthesia with the patient or authorized representative who has indicated his/her understanding and acceptance.     Plan Discussed with: CRNA and Surgeon  Anesthesia Plan Comments:         Anesthesia Quick Evaluation

## 2012-05-15 NOTE — Progress Notes (Signed)
Utilization Review Completed.   Carleen Rhue, RN, BSN Nurse Case Manager  [phone]  

## 2012-05-15 NOTE — Progress Notes (Signed)
   CARDIOTHORACIC SURGERY PROGRESS NOTE  Subjective: Patient presents for surgery today complaining of new onset pain in left groin.  No SOB.  No chest pain.  Objective: Vital signs in last 24 hours: Temp:  [97.7 F (36.5 C)-98.6 F (37 C)] 97.7 F (36.5 C) (02/20 0430) Pulse Rate:  [94-115] 100 (02/20 0430) Cardiac Rhythm:  [-] Atrial fibrillation (02/19 1117) Resp:  [20] 20 (02/19 2035) BP: (102-132)/(49-94) 104/49 mmHg (02/20 0430) SpO2:  [96 %-100 %] 96 % (02/20 0430)  Physical Exam:  Rhythm:   Afib  Breath sounds: clear  Heart sounds:  irreg  Incisions:  n/a  Abdomen:  soft  Extremities:  Very large, firm hematoma in left groin, ? Pulsatile - although she had a hematoma and ecchymosis previously, this looks very different - much larger, firm and somewhat tender   Intake/Output from previous day: 02/19 0701 - 02/20 0700 In: 660 [P.O.:660] Out: 1150 [Urine:1150] Intake/Output this shift:    Lab Results:  Recent Labs  05/14/12 0517 05/15/12 0500  WBC 10.8* 11.8*  HGB 12.7 11.7*  HCT 36.9 33.2*  PLT 213 209   BMET:  Recent Labs  05/13/12 0535 05/15/12 0500  NA 138 138  K 3.6 3.4*  CL 102 103  CO2 25 24  GLUCOSE 152* 147*  BUN 32* 41*  CREATININE 1.07 1.15*  CALCIUM 9.6 9.2    CBG (last 3)   Recent Labs  05/14/12 2048 05/14/12 2345 05/15/12 0609  GLUCAP 163* 153* 133*   PT/INR:   Recent Labs  05/14/12 0517  LABPROT 14.5  INR 1.15    CXR:  N/A  Assessment/Plan: S/P Procedure(s) (LRB): MINIMALLY INVASIVE MITRAL VALVE REPAIR (MVR) (Right) MAZE (N/A) INTRAOPERATIVE TRANSESOPHAGEAL ECHOCARDIOGRAM (N/A)  New enlarging hematoma left groin from cath site, possible pseudoaneurysm.  Patient appears very stable otherwise although Hgb did drop overnight to 11.7 from 13.6 two days ago.  Heparin is now off.  Will postpone mitral valve repair + maze and get U/S of groin.  Nima Bamburg H 05/15/2012 7:57 AM

## 2012-05-15 NOTE — Progress Notes (Signed)
SUBJECTIVE:  The patient did notice increased left leg pain today.  She was to have MV repair but she was noted to have a large hematoma on the left leg at the previous cath site.   PHYSICAL EXAM Filed Vitals:   05/14/12 2035 05/14/12 2338 05/15/12 0430 05/15/12 1118  BP: 106/65 128/94 104/49 103/62  Pulse: 104 107 100 76  Temp: 98.6 F (37 C)  97.7 F (36.5 C)   TempSrc: Oral  Oral   Resp: 20     Height:      Weight:      SpO2: 100%  96%    General:  No distress Lungs:  Clear Heart:  Irregular Abdomen:  Positive bowel sounds, no rebound no guarding Extremities:  No edema, bruising with large hematoma on the left leg. No bruit. No pulsatile mass  LABS:  Results for orders placed during the hospital encounter of 05/06/12 (from the past 24 hour(s))  SURGICAL PCR SCREEN     Status: None   Collection Time    05/14/12  3:19 PM      Result Value Range   MRSA, PCR NEGATIVE  NEGATIVE   Staphylococcus aureus NEGATIVE  NEGATIVE  GLUCOSE, CAPILLARY     Status: Abnormal   Collection Time    05/14/12  3:52 PM      Result Value Range   Glucose-Capillary 173 (*) 70 - 99 mg/dL   Comment 1 Documented in Chart     Comment 2 Notify RN    TYPE AND SCREEN     Status: None   Collection Time    05/14/12  8:02 PM      Result Value Range   ABO/RH(D) A POS     Antibody Screen NEG     Sample Expiration 05/17/2012     Unit Number [id number]     Blood Component Type RED CELLS,LR     Unit division 00     Status of Unit ALLOCATED     Transfusion Status OK TO TRANSFUSE     Crossmatch Result Compatible     Unit Number [id number]     Blood Component Type RED CELLS,LR     Unit division 00     Status of Unit ALLOCATED     Transfusion Status OK TO TRANSFUSE     Crossmatch Result Compatible    ABO/RH     Status: None   Collection Time    05/14/12  8:02 PM      Result Value Range   ABO/RH(D) A POS    GLUCOSE, CAPILLARY     Status: Abnormal   Collection Time    05/14/12  8:48 PM        Result Value Range   Glucose-Capillary 163 (*) 70 - 99 mg/dL   Comment 1 Notify RN     Comment 2 Documented in Chart    GLUCOSE, CAPILLARY     Status: Abnormal   Collection Time    05/14/12 11:45 PM      Result Value Range   Glucose-Capillary 153 (*) 70 - 99 mg/dL  CBC     Status: Abnormal   Collection Time    05/15/12  5:00 AM      Result Value Range   WBC 11.8 (*) 4.0 - 10.5 K/uL   RBC 3.74 (*) 3.87 - 5.11 MIL/uL   Hemoglobin 11.7 (*) 12.0 - 15.0 g/dL   HCT 30.8 (*) 65.7 - 84.6 %   MCV 88.8  78.0 -  100.0 fL   MCH 31.3  26.0 - 34.0 pg   MCHC 35.2  30.0 - 36.0 g/dL   RDW 16.1  09.6 - 04.5 %   Platelets 209  150 - 400 K/uL  BASIC METABOLIC PANEL     Status: Abnormal   Collection Time    05/15/12  5:00 AM      Result Value Range   Sodium 138  135 - 145 mEq/L   Potassium 3.4 (*) 3.5 - 5.1 mEq/L   Chloride 103  96 - 112 mEq/L   CO2 24  19 - 32 mEq/L   Glucose, Bld 147 (*) 70 - 99 mg/dL   BUN 41 (*) 6 - 23 mg/dL   Creatinine, Ser 4.09 (*) 0.50 - 1.10 mg/dL   Calcium 9.2  8.4 - 81.1 mg/dL   GFR calc non Af Amer 42 (*) >90 mL/min   GFR calc Af Amer 49 (*) >90 mL/min  APTT     Status: None   Collection Time    05/15/12  5:00 AM      Result Value Range   aPTT 27  24 - 37 seconds  GLUCOSE, CAPILLARY     Status: Abnormal   Collection Time    05/15/12  6:09 AM      Result Value Range   Glucose-Capillary 133 (*) 70 - 99 mg/dL  GLUCOSE, CAPILLARY     Status: Abnormal   Collection Time    05/15/12 11:32 AM      Result Value Range   Glucose-Capillary 183 (*) 70 - 99 mg/dL    Intake/Output Summary (Last 24 hours) at 05/15/12 1349 Last data filed at 05/15/12 1321  Gross per 24 hour  Intake    460 ml  Output    926 ml  Net   -466 ml    ASSESSMENT AND PLAN:  Acute diastolic heart failure:   Euvolemic.  Continue current therapy.  Continue Lasix 40 mg daily.   Mitral regurgitation:  Right and left heart cath Monday.  Valve repair on hold.  Atrial fibrillation:  Rate  OK.  Plan Maze with MV repair.  Holding heparin Continuing amiodarone.   Diabetes:  Continue current therapy.   Hematoma:  The hematoma has increased in size and she is having increased pain.  There is no ultrasound evidence of active bleeding, AV fistula or pulsatile mass.  Hgb has dropped one point.  I do suspect that she might have had rebleeding from her wound.  Surgery is on hold.  I will hold heparin and follow Hbg.  Probable surgery early next week.     Rollene Rotunda 05/15/2012 1:49 PM

## 2012-05-15 NOTE — Progress Notes (Addendum)
*  PRELIMINARY RESULTS* Vascular Ultrasound Limited Left Lower Extremity Arterial Duplex has been completed.   There is evidence of a hematoma of the left groin which appears to be a resolved pseudoaneurysm. The left common femoral and left peroneal arteries are patent with biphasic flow. Preliminary results discussed with Dr. Cornelius Moras.  05/15/2012 8:57 AM Gertie Fey, RDMS, RDCS

## 2012-05-15 NOTE — Progress Notes (Signed)
TCTS BRIEF PROGRESS NOTE   [name] remains stable and groin U/S reveals large hematoma without sign of false aneurysm.  I favor keeping her off heparin and watching Hgb over the next 24-48 hours.  Will tentatively reschedule surgery for Tuesday 2/25  [name] [name] H 05/15/2012 1:25 PM

## 2012-05-16 LAB — GLUCOSE, CAPILLARY
Glucose-Capillary: 148 mg/dL — ABNORMAL HIGH (ref 70–99)
Glucose-Capillary: 197 mg/dL — ABNORMAL HIGH (ref 70–99)

## 2012-05-16 LAB — CBC
HCT: 24.9 % — ABNORMAL LOW (ref 36.0–46.0)
Hemoglobin: 8.8 g/dL — ABNORMAL LOW (ref 12.0–15.0)
RBC: 2.81 MIL/uL — ABNORMAL LOW (ref 3.87–5.11)
RDW: 13.5 % (ref 11.5–15.5)
WBC: 12.4 10*3/uL — ABNORMAL HIGH (ref 4.0–10.5)

## 2012-05-16 LAB — BASIC METABOLIC PANEL
BUN: 53 mg/dL — ABNORMAL HIGH (ref 6–23)
CO2: 23 mEq/L (ref 19–32)
Chloride: 102 mEq/L (ref 96–112)
GFR calc Af Amer: 36 mL/min — ABNORMAL LOW (ref >90)
Potassium: 3.6 mEq/L (ref 3.5–5.1)

## 2012-05-16 NOTE — Progress Notes (Signed)
Physical Therapy Treatment Patient Details Name: [name] [name] MRN: [id number] DOB: [birth year] Today's Date: 05/16/2012 Time: 4696-2952 PT Time Calculation (min): 23 min  PT Assessment / Plan / Recommendation Comments on Treatment Session  Pt s/p cardiac arrest, Afib and MR who developed left groin hematoma after cath and delayed MVR due to hematoma. Pt with outlined hematoma with no change with activity and no pain with mobility. pt with decreased activity tolerance today but still progressing and encouraged mobility with nursing.     Follow Up Recommendations        Does the patient have the potential to tolerate intense rehabilitation     Barriers to Discharge        Equipment Recommendations       Recommendations for Other Services    Frequency     Plan Discharge plan remains appropriate;Frequency remains appropriate    Precautions / Restrictions Precautions Precautions: Fall Precaution Comments: left groin hematoma   Pertinent Vitals/Pain No pain    Mobility  Bed Mobility Supine to Sit: 6: Modified independent (Device/Increase time);HOB flat Transfers Sit to Stand: 6: Modified independent (Device/Increase time);From bed Stand to Sit: 5: Supervision;To chair/3-in-1 Details for Transfer Assistance: cueing for hand placement to chair and backing fully to surface Ambulation/Gait Ambulation/Gait Assistance: 5: Supervision Ambulation Distance (Feet): 400 Feet Assistive device: Rolling walker Ambulation/Gait Assistance Details: 4 standing rest breaks during gait sats maintained at 98% and HR 110 Gait Pattern: Step-through pattern;Decreased stride length Gait velocity: decreased Stairs: No    Exercises General Exercises - Lower Extremity Long Arc Quad: AROM;Both;20 reps;Seated Hip Flexion/Marching: AROM;Both;20 reps;Seated   PT Diagnosis:    PT Problem List:   PT Treatment Interventions:     PT Goals Acute Rehab PT Goals PT Goal: Sit to Stand - Progress: Met PT  Goal: Stand to Sit - Progress: Progressing toward goal Pt will Ambulate: >150 feet;with modified independence;with least restrictive assistive device PT Goal: Ambulate - Progress: Updated due to goal met  Visit Information  Last PT Received On: 05/16/12 Assistance Needed: +1    Subjective Data  Subjective: I feel ok, this is just making it take longer- referring to hematoma preventing sx and DC   Cognition  Cognition Overall Cognitive Status: Appears within functional limits for tasks assessed/performed Arousal/Alertness: Awake/alert Orientation Level: Appears intact for tasks assessed Behavior During Session: [facility] for tasks performed    Balance     End of Session PT - End of Session Activity Tolerance: Patient tolerated treatment well Patient left: in chair;with call bell/phone within reach   GP     Delorse Lek 05/16/2012, 12:15 PM Delaney Meigs, PT [phone]

## 2012-05-16 NOTE — Progress Notes (Signed)
   CARDIOTHORACIC SURGERY PROGRESS NOTE  Subjective: Feels weak.  No SOB  Objective: Vital signs in last 24 hours: Temp:  [97.4 F (36.3 C)-98.3 F (36.8 C)] 97.4 F (36.3 C) (02/21 1431) Pulse Rate:  [80-112] 80 (02/21 1431) Cardiac Rhythm:  [-] Atrial fibrillation (02/21 1026) Resp:  [18-20] 20 (02/21 1431) BP: (104-119)/(41-77) 110/41 mmHg (02/21 1431) SpO2:  [93 %-100 %] 100 % (02/21 1431) Weight:  [67.4 kg (148 lb 9.4 oz)] 67.4 kg (148 lb 9.4 oz) (02/21 4098)  Physical Exam:  Rhythm:   Afib  Breath sounds: clear  Heart sounds:  irreg  Incisions:  n/a  Abdomen:  soft  Extremities:  Hematoma left groin seems reasonably stable although ecchymosis now extending further across to right side   Intake/Output from previous day: 02/20 0701 - 02/21 0700 In: 480 [P.O.:480] Out: 327 [Urine:325; Stool:2] Intake/Output this shift: Total I/O In: 480 [P.O.:480] Out: 200 [Urine:200]  Lab Results:  Recent Labs  05/15/12 0500 05/16/12 0500  WBC 11.8* 12.4*  HGB 11.7* 8.8*  HCT 33.2* 24.9*  PLT 209 194   BMET:  Recent Labs  05/15/12 0500 05/16/12 0500  NA 138 135  K 3.4* 3.6  CL 103 102  CO2 24 23  GLUCOSE 147* 149*  BUN 41* 53*  CREATININE 1.15* 1.47*  CALCIUM 9.2 9.0    CBG (last 3)   Recent Labs  05/15/12 2126 05/16/12 0641 05/16/12 1121  GLUCAP 177* 148* 197*   PT/INR:   Recent Labs  05/14/12 0517  LABPROT 14.5  INR 1.15    CXR:  N/A  Assessment/Plan: S/P Procedure(s) (LRB): CANCELLED PROCEDURE (N/A)  Overall Mrs Bucher appears stable, although her Hgb continues to fall. I will f/u again on Monday.  Still possibly for surgery on Tuesday, depending on her progress and stability.  Narayan Scull H 05/16/2012 4:21 PM

## 2012-05-16 NOTE — Progress Notes (Signed)
SUBJECTIVE:  She has had no acute SOB.  She reports only mild tenderness on the left groin but it is not getting worse.    PHYSICAL EXAM Filed Vitals:   05/15/12 1358 05/15/12 2100 05/15/12 2131 05/16/12 0626  BP: 102/61 108/69 119/77 110/72  Pulse: 85 104 112 92  Temp: 97 F (36.1 C) 98.1 F (36.7 C)  98.3 F (36.8 C)  TempSrc: Oral Oral  Oral  Resp: 20 18    Height:      Weight:    148 lb 9.4 oz (67.4 kg)  SpO2: 100% 99%  93%   General:  No distress Lungs:  Clear Heart:  Irregular Abdomen:  Positive bowel sounds, no rebound no guarding Extremities:  No edema, bruising with large hematoma on the left leg unchanged. No bruit. No pulsatile mass  LABS:  Results for orders placed during the hospital encounter of 05/06/12 (from the past 24 hour(s))  GLUCOSE, CAPILLARY     Status: Abnormal   Collection Time    05/15/12 11:32 AM      Result Value Range   Glucose-Capillary 183 (*) 70 - 99 mg/dL  GLUCOSE, CAPILLARY     Status: Abnormal   Collection Time    05/15/12  4:28 PM      Result Value Range   Glucose-Capillary 153 (*) 70 - 99 mg/dL   Comment 1 Notify RN    GLUCOSE, CAPILLARY     Status: Abnormal   Collection Time    05/15/12  9:26 PM      Result Value Range   Glucose-Capillary 177 (*) 70 - 99 mg/dL   Comment 1 Notify RN     Comment 2 Documented in Chart    CBC     Status: Abnormal   Collection Time    05/16/12  5:00 AM      Result Value Range   WBC 12.4 (*) 4.0 - 10.5 K/uL   RBC 2.81 (*) 3.87 - 5.11 MIL/uL   Hemoglobin 8.8 (*) 12.0 - 15.0 g/dL   HCT 16.1 (*) 09.6 - 04.5 %   MCV 88.6  78.0 - 100.0 fL   MCH 31.3  26.0 - 34.0 pg   MCHC 35.3  30.0 - 36.0 g/dL   RDW 40.9  81.1 - 91.4 %   Platelets 194  150 - 400 K/uL  BASIC METABOLIC PANEL     Status: Abnormal   Collection Time    05/16/12  5:00 AM      Result Value Range   Sodium 135  135 - 145 mEq/L   Potassium 3.6  3.5 - 5.1 mEq/L   Chloride 102  96 - 112 mEq/L   CO2 23  19 - 32 mEq/L   Glucose, Bld  149 (*) 70 - 99 mg/dL   BUN 53 (*) 6 - 23 mg/dL   Creatinine, Ser 7.82 (*) 0.50 - 1.10 mg/dL   Calcium 9.0  8.4 - 95.6 mg/dL   GFR calc non Af Amer 31 (*) >90 mL/min   GFR calc Af Amer 36 (*) >90 mL/min  GLUCOSE, CAPILLARY     Status: Abnormal   Collection Time    05/16/12  6:41 AM      Result Value Range   Glucose-Capillary 148 (*) 70 - 99 mg/dL    Intake/Output Summary (Last 24 hours) at 05/16/12 0953 Last data filed at 05/16/12 0902  Gross per 24 hour  Intake    720 ml  Output  327 ml  Net    393 ml    ASSESSMENT AND PLAN:  Hematoma:  This does not appear to have changed between yesterday am and today.  I think she had a re bleed two nights ago.  Heparin is off.  Follow Hgb which has dropped.  I outlined the main central hematoma.  The surrounding hematoma is also delineated.    Acute diastolic heart failure:   Euvolemic.  Continue current therapy.  Continue Lasix 40 mg daily.   Mitral regurgitation:  Right and left heart cath Monday.  Valve repair on hold until Tuesday.  Atrial fibrillation:  Rate OK.  Plan Maze with MV repair.  Holding heparin Continuing amiodarone.   Diabetes:  Continue current therapy.     Rollene Rotunda 05/16/2012 9:53 AM

## 2012-05-17 LAB — CBC
MCH: 32.1 pg (ref 26.0–34.0)
MCH: 31.4 pg (ref 26.0–34.0)
MCHC: 35.9 g/dL (ref 30.0–36.0)
MCV: 89.3 fL (ref 78.0–100.0)
Platelets: 184 10*3/uL (ref 150–400)
Platelets: 192 10*3/uL (ref 150–400)
RDW: 13.9 % (ref 11.5–15.5)
RDW: 14.5 % (ref 11.5–15.5)
WBC: 10.4 10*3/uL (ref 4.0–10.5)

## 2012-05-17 LAB — GLUCOSE, CAPILLARY
Glucose-Capillary: 165 mg/dL — ABNORMAL HIGH (ref 70–99)
Glucose-Capillary: 212 mg/dL — ABNORMAL HIGH (ref 70–99)

## 2012-05-17 MED ORDER — ACETAMINOPHEN 325 MG PO TABS
650.0000 mg | ORAL_TABLET | Freq: Once | ORAL | Status: AC
  Administered 2012-05-17: 650 mg via ORAL
  Filled 2012-05-17: qty 2

## 2012-05-17 MED ORDER — DIPHENHYDRAMINE HCL 25 MG PO CAPS
25.0000 mg | ORAL_CAPSULE | Freq: Once | ORAL | Status: AC
  Administered 2012-05-17: 25 mg via ORAL
  Filled 2012-05-17: qty 1

## 2012-05-17 NOTE — Progress Notes (Signed)
Subjective:  79 year old with left groin hematoma following diagnostic cardiac catheterization in preparation for mitral valve repair, Maze procedure.  This morning, she continues to feel washed out. She is not short of breath. No chest pain. Her left groin hurts when she moves her extremity. It is not significantly painful though. It is not significantly changed from yesterday.  Objective:  Vital Signs in the last 24 hours: Temp:  [97.4 F (36.3 C)-98.4 F (36.9 C)] 98.4 F (36.9 C) (02/22 0629) Pulse Rate:  [80-112] 112 (02/22 0629) Resp:  [18-20] 20 (02/22 0629) BP: (110-116)/(40-48) 116/48 mmHg (02/22 0629) SpO2:  [97 %-100 %] 97 % (02/22 0629) Weight:  [69.4 kg (153 lb)] 69.4 kg (153 lb) (02/22 0629)  Intake/Output from previous day: 02/21 0701 - 02/22 0700 In: 720 [P.O.:720] Out: 450 [Urine:450]   Physical Exam: General: Elderly, sitting in chair, in no acute distress. Head:  Normocephalic and atraumatic. Pale conjunctiva. Lungs: Clear to auscultation and percussion. Heart:  Irregularly irregular, mildly tachycardic. Soft systolic murmur.  Abdomen: soft, non-tender, positive bowel sounds. Extremities: Large left thigh area of ecchymosis, leg hematoma palpated, does not appear to be significantly changed from previously marked site. Neurologic: Alert and oriented x 3. Normal distal pulses.    Lab Results:  Recent Labs  05/16/12 0500 05/17/12 0610  WBC 12.4* 10.4  HGB 8.8* 7.8*  PLT 194 184    Recent Labs  05/15/12 0500 05/16/12 0500  NA 138 135  K 3.4* 3.6  CL 103 102  CO2 24 23  GLUCOSE 147* 149*  BUN 41* 53*  CREATININE 1.15* 1.47*  Imaging: There is evidence of a 8.3cm hematoma of the left groin, which appears to be a resolved pseudoaneurysm. The left common femoral and peroneal arteries are patent with biphasic flow.   Telemetry: Atrial fibrillation, heart rate 100-120 Personally viewed.   Cardiac Studies:  Cardiac catheterization 05/12/12 with  normal ejection fraction, moderate to severe mitral regurgitation, mild nonobstructive coronary artery disease.  Assessment/Plan:   79 year old female with severe mitral regurgitation, atrial fibrillation awaiting upcoming surgery by Dr. Cornelius Moras who had left groin hematoma following cardiac catheterization now with continuing decrease in her hemoglobin.  1. Anemia secondary to blood loss-hemoglobin this morning 7.8 from 8.8 yesterday from 11.7 the day before. Given that she continues to decrease, age, upcoming surgery, I will transfuse one unit and repeat CBC in afternoon. She is willing to proceed with transfusion. She had a prior transfusion during childbirth.   2. Left groin hematoma-this does not seem to be changed significantly in size from previous markings. She does not have low back pain. She does have a significant amount of ecchymosis in her left thigh. Doppler reviewed as above.  3. Severe mitral regurgitation-awaiting surgery. This was postponed because of her groin complication.  4. Atrial fibrillation-unable to provide with anticoagulation obviously currently. No aspirin. Continue with amiodarone 200 mg twice a day. Metoprolol 25 mg twice a day. Atrial fibrillation rate fairly reasonably controlled.  5. Diabetes-no changes made.  6. Acute kidney injury-creatinine increased from 1.1-1.47 on 2/21. I will repeat her basic metabolic profile.    SKAINS, MARK 05/17/2012, 11:12 AM

## 2012-05-18 LAB — BASIC METABOLIC PANEL WITH GFR
BUN: 37 mg/dL — ABNORMAL HIGH (ref 6–23)
CO2: 25 meq/L (ref 19–32)
Calcium: 8.9 mg/dL (ref 8.4–10.5)
Chloride: 107 meq/L (ref 96–112)
Creatinine, Ser: 1.03 mg/dL (ref 0.50–1.10)
GFR calc Af Amer: 56 mL/min — ABNORMAL LOW
GFR calc non Af Amer: 48 mL/min — ABNORMAL LOW
Glucose, Bld: 125 mg/dL — ABNORMAL HIGH (ref 70–99)
Potassium: 3.3 meq/L — ABNORMAL LOW (ref 3.5–5.1)
Sodium: 140 meq/L (ref 135–145)

## 2012-05-18 LAB — TYPE AND SCREEN
ABO/RH(D): A POS
Unit division: 0

## 2012-05-18 LAB — CBC
HCT: 25.7 % — ABNORMAL LOW (ref 36.0–46.0)
Hemoglobin: 9.2 g/dL — ABNORMAL LOW (ref 12.0–15.0)
MCH: 31.3 pg (ref 26.0–34.0)
MCHC: 35.8 g/dL (ref 30.0–36.0)
MCV: 87.4 fL (ref 78.0–100.0)
Platelets: 200 K/uL (ref 150–400)
RBC: 2.94 MIL/uL — ABNORMAL LOW (ref 3.87–5.11)
RDW: 14.9 % (ref 11.5–15.5)
WBC: 9.9 K/uL (ref 4.0–10.5)

## 2012-05-18 LAB — GLUCOSE, CAPILLARY
Glucose-Capillary: 120 mg/dL — ABNORMAL HIGH (ref 70–99)
Glucose-Capillary: 193 mg/dL — ABNORMAL HIGH (ref 70–99)

## 2012-05-18 MED ORDER — POTASSIUM CHLORIDE CRYS ER 20 MEQ PO TBCR
40.0000 meq | EXTENDED_RELEASE_TABLET | Freq: Once | ORAL | Status: AC
  Administered 2012-05-18: 40 meq via ORAL
  Filled 2012-05-18: qty 2

## 2012-05-18 NOTE — Progress Notes (Signed)
Subjective:  Feels better today. Received blood transfusion, one unit yesterday after worsening hemoglobin down to 7.8, symptomatic. Large ecchymosis left thigh.  Objective:  Vital Signs in the last 24 hours: Temp:  [97 F (36.1 C)-99.3 F (37.4 C)] 98.1 F (36.7 C) (02/23 3086) Pulse Rate:  [75-103] 76 (02/23 1040) Resp:  [18-20] 20 (02/23 0613) BP: (97-136)/(33-74) 114/49 mmHg (02/23 1040) SpO2:  [100 %] 100 % (02/23 0613) Weight:  [68.2 kg (150 lb 5.7 oz)] 68.2 kg (150 lb 5.7 oz) (02/23 5784)  Intake/Output from previous day: 02/22 0701 - 02/23 0700 In: 1050 [P.O.:700; Blood:350] Out: 875 [Urine:875]   Physical Exam: General: Elderly, sitting in chair, in no acute distress.  Head: Normocephalic and atraumatic. Pale conjunctiva.  Lungs: Clear to auscultation and percussion.  Heart: Irregularly irregular, mildly tachycardic. Soft systolic murmur.  Abdomen: soft, non-tender, positive bowel sounds.  Extremities: Large left thigh area of ecchymosis, leg hematoma palpated, does not appear to be significantly changed from previously marked site.  Neurologic: Alert and oriented x 3. Normal distal pulses.     Lab Results:  Recent Labs  05/17/12 2214 05/18/12 0550  WBC 11.1* 9.9  HGB 9.0* 9.2*  PLT 192 200    Recent Labs  05/16/12 0500 05/18/12 0550  NA 135 140  K 3.6 3.3*  CL 102 107  CO2 23 25  GLUCOSE 149* 125*  BUN 53* 37*  CREATININE 1.47* 1.03   Telemetry: Atrial fibrillation, no adverse arrhythmias Personally viewed.   Assessment/Plan:  Principal Problem:   Mitral regurgitation Active Problems:   Cardiac arrest   Shock   Encephalopathy acute   Hyperglycemia   Diabetes type 2, uncontrolled   Acute respiratory failure   Cardiogenic shock   Acute diastolic heart failure   Atrial fibrillation   Pleural effusion   Hematoma   79 year old female with severe mitral regurgitation, atrial fibrillation awaiting upcoming surgery by Dr. Cornelius Moras who had left  groin hematoma following cardiac catheterization now with continuing decrease in her hemoglobin.    1. Anemia secondary to blood loss-hemoglobin this morning 9.2 up from 7.8 after administration of one unit of packed red blood cells on 05/17/12, yesterday. Overall she feels improved.  2. Left groin hematoma-this does not seem to be changed significantly in size from previous markings. Softer today. She does not have low back pain. She does have a significant amount of ecchymosis in her left thigh. Doppler reviewed as above.   3. Severe mitral regurgitation-awaiting surgery. This was postponed because of her groin complication.   4. Atrial fibrillation-unable to provide with anticoagulation obviously currently. No aspirin. Continue with amiodarone 200 mg twice a day. Metoprolol 25 mg twice a day. Atrial fibrillation rate fairly reasonably controlled.   5. Diabetes-no changes made.   6. Acute kidney injury-creatinine increased from 1.1-1.47 on 2/21. Creatinine this morning improved at 1.03.  7. Hypokalemia-replete potassium.   SKAINS, MARK 05/18/2012, 10:58 AM

## 2012-05-18 NOTE — Plan of Care (Signed)
Problem: Phase II Progression Outcomes Goal: Tolerates diet Outcome: Completed/Met Date Met:  05/18/12 Pt on heart healthy diet, no c/o n/v, tolerates diet well

## 2012-05-19 DIAGNOSIS — I4891 Unspecified atrial fibrillation: Secondary | ICD-10-CM

## 2012-05-19 DIAGNOSIS — I059 Rheumatic mitral valve disease, unspecified: Secondary | ICD-10-CM

## 2012-05-19 LAB — CBC
HCT: 26.5 % — ABNORMAL LOW (ref 36.0–46.0)
MCH: 31.7 pg (ref 26.0–34.0)
MCHC: 35.1 g/dL (ref 30.0–36.0)
MCV: 90.4 fL (ref 78.0–100.0)
RDW: 15.8 % — ABNORMAL HIGH (ref 11.5–15.5)
WBC: 9.1 10*3/uL (ref 4.0–10.5)

## 2012-05-19 LAB — BASIC METABOLIC PANEL
BUN: 33 mg/dL — ABNORMAL HIGH (ref 6–23)
Calcium: 8.9 mg/dL (ref 8.4–10.5)
Chloride: 107 mEq/L (ref 96–112)
Creatinine, Ser: 0.96 mg/dL (ref 0.50–1.10)
GFR calc Af Amer: 61 mL/min — ABNORMAL LOW (ref >90)

## 2012-05-19 LAB — GLUCOSE, CAPILLARY: Glucose-Capillary: 122 mg/dL — ABNORMAL HIGH (ref 70–99)

## 2012-05-19 MED ORDER — EPINEPHRINE HCL 1 MG/ML IJ SOLN
0.5000 ug/min | INTRAVENOUS | Status: DC
  Filled 2012-05-19: qty 4

## 2012-05-19 MED ORDER — DOPAMINE-DEXTROSE 3.2-5 MG/ML-% IV SOLN
2.0000 ug/kg/min | INTRAVENOUS | Status: DC
  Filled 2012-05-19: qty 250

## 2012-05-19 MED ORDER — POTASSIUM CHLORIDE 2 MEQ/ML IV SOLN
80.0000 meq | INTRAVENOUS | Status: DC
  Filled 2012-05-19: qty 40

## 2012-05-19 MED ORDER — SODIUM CHLORIDE 0.9 % IV SOLN
INTRAVENOUS | Status: AC
  Administered 2012-05-20: 2.1 [IU]/h via INTRAVENOUS
  Filled 2012-05-19: qty 1

## 2012-05-19 MED ORDER — DEXMEDETOMIDINE HCL IN NACL 400 MCG/100ML IV SOLN
0.1000 ug/kg/h | INTRAVENOUS | Status: AC
  Administered 2012-05-20: .3 ug/kg/h via INTRAVENOUS
  Filled 2012-05-19: qty 100

## 2012-05-19 MED ORDER — CEFUROXIME SODIUM 750 MG IJ SOLR
750.0000 mg | INTRAMUSCULAR | Status: DC
  Filled 2012-05-19: qty 750

## 2012-05-19 MED ORDER — NITROGLYCERIN IN D5W 200-5 MCG/ML-% IV SOLN
2.0000 ug/min | INTRAVENOUS | Status: AC
  Administered 2012-05-20: 5 ug/min via INTRAVENOUS
  Filled 2012-05-19: qty 250

## 2012-05-19 MED ORDER — BISACODYL 5 MG PO TBEC
5.0000 mg | DELAYED_RELEASE_TABLET | Freq: Once | ORAL | Status: AC
  Administered 2012-05-19: 5 mg via ORAL
  Filled 2012-05-19: qty 1

## 2012-05-19 MED ORDER — DEXTROSE 5 % IV SOLN
1.5000 g | INTRAVENOUS | Status: AC
  Administered 2012-05-20: .75 g via INTRAVENOUS
  Administered 2012-05-20: 1.5 g via INTRAVENOUS
  Filled 2012-05-19: qty 1.5

## 2012-05-19 MED ORDER — MAGNESIUM SULFATE 50 % IJ SOLN
40.0000 meq | INTRAMUSCULAR | Status: DC
  Filled 2012-05-19: qty 10

## 2012-05-19 MED ORDER — VERAPAMIL HCL 2.5 MG/ML IV SOLN
INTRAVENOUS | Status: DC
  Filled 2012-05-19: qty 2.5

## 2012-05-19 MED ORDER — CHLORHEXIDINE GLUCONATE 4 % EX LIQD
60.0000 mL | Freq: Once | CUTANEOUS | Status: AC
  Administered 2012-05-20: 4 via TOPICAL
  Filled 2012-05-19: qty 60

## 2012-05-19 MED ORDER — VANCOMYCIN HCL 10 G IV SOLR
1250.0000 mg | INTRAVENOUS | Status: AC
  Administered 2012-05-20: 1250 mg via INTRAVENOUS
  Filled 2012-05-19: qty 1250

## 2012-05-19 MED ORDER — METOPROLOL TARTRATE 12.5 MG HALF TABLET
12.5000 mg | ORAL_TABLET | Freq: Once | ORAL | Status: AC
  Administered 2012-05-20: 12.5 mg via ORAL
  Filled 2012-05-19: qty 1

## 2012-05-19 MED ORDER — VANCOMYCIN HCL 1000 MG IV SOLR
INTRAVENOUS | Status: AC
  Administered 2012-05-20: 12:00:00
  Filled 2012-05-19: qty 1000

## 2012-05-19 MED ORDER — TEMAZEPAM 15 MG PO CAPS
15.0000 mg | ORAL_CAPSULE | Freq: Once | ORAL | Status: AC | PRN
  Administered 2012-05-19: 15 mg via ORAL
  Filled 2012-05-19: qty 1

## 2012-05-19 MED ORDER — SODIUM CHLORIDE 0.9 % IV SOLN
INTRAVENOUS | Status: AC
  Administered 2012-05-20: 70 mL/h via INTRAVENOUS
  Filled 2012-05-19: qty 40

## 2012-05-19 MED ORDER — PHENYLEPHRINE HCL 10 MG/ML IJ SOLN
30.0000 ug/min | INTRAVENOUS | Status: AC
  Administered 2012-05-20: 20 ug/min via INTRAVENOUS
  Filled 2012-05-19: qty 2

## 2012-05-19 NOTE — Progress Notes (Signed)
Subjective: No CP or SOB.   Objective: Filed Vitals:   05/18/12 1040 05/18/12 1410 05/18/12 2028 05/19/12 0428  BP: 114/49 105/41 135/55 128/61  Pulse: 76 86 84 88  Temp:  98.3 F (36.8 C) 98.1 F (36.7 C) 98.2 F (36.8 C)  TempSrc:  Oral Oral Oral  Resp:  19 20 20   Height:      Weight:    151 lb 14.4 oz (68.9 kg)  SpO2:  98% 100% 99%   Weight change: 1 lb 8.7 oz (0.7 kg)  Intake/Output Summary (Last 24 hours) at 05/19/12 0738 Last data filed at 05/19/12 0641  Gross per 24 hour  Intake   1160 ml  Output    975 ml  Net    185 ml    General: Alert, awake, oriented x3, in no acute distress Neck:  JVP is normal Heart: Regular rate and rhythm, without murmurs, rubs, gallops.  Lungs: Clear to auscultation.  No rales or wheezes. Exemities:  No edema.   Neuro: Grossly intact, nonfocal.  Tele:  Afib 80s Lab Results: Results for orders placed during the hospital encounter of 05/06/12 (from the past 24 hour(s))  GLUCOSE, CAPILLARY     Status: Abnormal   Collection Time    05/18/12 11:36 AM      Result Value Range   Glucose-Capillary 198 (*) 70 - 99 mg/dL  GLUCOSE, CAPILLARY     Status: Abnormal   Collection Time    05/18/12  4:08 PM      Result Value Range   Glucose-Capillary 193 (*) 70 - 99 mg/dL  GLUCOSE, CAPILLARY     Status: Abnormal   Collection Time    05/18/12  9:26 PM      Result Value Range   Glucose-Capillary 160 (*) 70 - 99 mg/dL   Comment 1 Notify RN    CBC     Status: Abnormal   Collection Time    05/19/12  5:00 AM      Result Value Range   WBC 9.1  4.0 - 10.5 K/uL   RBC 2.93 (*) 3.87 - 5.11 MIL/uL   Hemoglobin 9.3 (*) 12.0 - 15.0 g/dL   HCT 13.0 (*) 86.5 - 78.4 %   MCV 90.4  78.0 - 100.0 fL   MCH 31.7  26.0 - 34.0 pg   MCHC 35.1  30.0 - 36.0 g/dL   RDW 69.6 (*) 29.5 - 28.4 %   Platelets 206  150 - 400 K/uL  BASIC METABOLIC PANEL     Status: Abnormal   Collection Time    05/19/12  5:00 AM      Result Value Range   Sodium 139  135 - 145 mEq/L   Potassium 3.7  3.5 - 5.1 mEq/L   Chloride 107  96 - 112 mEq/L   CO2 24  19 - 32 mEq/L   Glucose, Bld 162 (*) 70 - 99 mg/dL   BUN 33 (*) 6 - 23 mg/dL   Creatinine, Ser 1.32  0.50 - 1.10 mg/dL   Calcium 8.9  8.4 - 44.0 mg/dL   GFR calc non Af Amer 52 (*) >90 mL/min   GFR calc Af Amer 61 (*) >90 mL/min  GLUCOSE, CAPILLARY     Status: Abnormal   Collection Time    05/19/12  6:42 AM      Result Value Range   Glucose-Capillary 129 (*) 70 - 99 mg/dL   Comment 1 Notify RN      Studies/Results: @[id number] @  Medications: Reviewed  Assessment/Plan  1.  Anemia  Hgb is stable.  Follow  2.  Hematoma      3.  Afib  Rates controlled.  Not on any anticoag or antiplt agent.  Continue amio 200 bid and metoprolol.    4.  Severe MR  C [name] to determine time for surgery.  5.  DM  Glu is OK  6.  CRI  Follow.   LOS: 13 days   Dietrich Pates 05/19/2012, 7:38 AM

## 2012-05-19 NOTE — Progress Notes (Signed)
   CARDIOTHORACIC SURGERY PROGRESS NOTE  Subjective: [name] [name] is doing well.  No SOB.  Left groin less tender and firm.  Objective: Vital signs in last 24 hours: Temp:  [97.9 F (36.6 C)-98.2 F (36.8 C)] 97.9 F (36.6 C) (02/24 1416) Pulse Rate:  [84-92] 92 (02/24 1416) Cardiac Rhythm:  [-] Atrial fibrillation (02/24 0830) Resp:  [19-20] 19 (02/24 1416) BP: (108-135)/(48-61) 108/48 mmHg (02/24 1416) SpO2:  [98 %-100 %] 98 % (02/24 1416) Weight:  [68.9 kg (151 lb 14.4 oz)] 68.9 kg (151 lb 14.4 oz) (02/24 0428)  Physical Exam:  Rhythm:   Afib  Breath sounds: clear  Heart sounds:  irreg  Incisions:  n/a  Abdomen:  soft  Extremities:  Warm, left groin hematoma less tight, less tender   Intake/Output from previous day: 02/23 0701 - 02/24 0700 In: 1160 [P.O.:1160] Out: 975 [Urine:975] Intake/Output this shift:    Lab Results:  Recent Labs  05/18/12 0550 05/19/12 0500  WBC 9.9 9.1  HGB 9.2* 9.3*  HCT 25.7* 26.5*  PLT 200 206   BMET:  Recent Labs  05/18/12 0550 05/19/12 0500  NA 140 139  K 3.3* 3.7  CL 107 107  CO2 25 24  GLUCOSE 125* 162*  BUN 37* 33*  CREATININE 1.03 0.96  CALCIUM 8.9 8.9    CBG (last 3)   Recent Labs  05/19/12 0642 05/19/12 1114 05/19/12 1642  GLUCAP 129* 244* 122*   PT/INR:  No results found for this basename: LABPROT, INR,  in the last 72 hours  CXR:  N/A  Assessment/Plan:  For OR tomorrow.  I have again reviewed the indications, risks and potential benefits of surgery with the patient and her family.  Expectations for her post-op recovery discussed.  All questions answered.  Purcell Nails 05/19/2012 7:48 PM

## 2012-05-20 ENCOUNTER — Inpatient Hospital Stay (HOSPITAL_COMMUNITY): Payer: Medicare Other | Admitting: Anesthesiology

## 2012-05-20 ENCOUNTER — Encounter (HOSPITAL_COMMUNITY): Payer: Self-pay | Admitting: Thoracic Surgery (Cardiothoracic Vascular Surgery)

## 2012-05-20 ENCOUNTER — Encounter (HOSPITAL_COMMUNITY)
Admission: RE | Disposition: A | Discharge status: Skilled Nursing Facility | Payer: Self-pay | Source: Other Acute Inpatient Hospital | Attending: Internal Medicine

## 2012-05-20 ENCOUNTER — Inpatient Hospital Stay (HOSPITAL_COMMUNITY): Payer: Medicare Other

## 2012-05-20 ENCOUNTER — Encounter (HOSPITAL_COMMUNITY): Payer: Self-pay | Admitting: Anesthesiology

## 2012-05-20 DIAGNOSIS — I059 Rheumatic mitral valve disease, unspecified: Secondary | ICD-10-CM

## 2012-05-20 DIAGNOSIS — I4891 Unspecified atrial fibrillation: Secondary | ICD-10-CM

## 2012-05-20 DIAGNOSIS — Z8679 Personal history of other diseases of the circulatory system: Secondary | ICD-10-CM

## 2012-05-20 DIAGNOSIS — Z9889 Other specified postprocedural states: Secondary | ICD-10-CM

## 2012-05-20 HISTORY — PX: MINIMALLY INVASIVE MAZE PROCEDURE: SHX6244

## 2012-05-20 HISTORY — PX: MITRAL VALVE REPAIR: SHX2039

## 2012-05-20 HISTORY — PX: INTRAOPERATIVE TRANSESOPHAGEAL ECHOCARDIOGRAM: SHX5062

## 2012-05-20 HISTORY — DX: Personal history of other diseases of the circulatory system: Z86.79

## 2012-05-20 HISTORY — DX: Other specified postprocedural states: Z98.890

## 2012-05-20 LAB — BASIC METABOLIC PANEL
CO2: 24 mEq/L (ref 19–32)
Calcium: 8.5 mg/dL (ref 8.4–10.5)
Glucose, Bld: 131 mg/dL — ABNORMAL HIGH (ref 70–99)
Potassium: 3.8 mEq/L (ref 3.5–5.1)
Sodium: 139 mEq/L (ref 135–145)

## 2012-05-20 LAB — POCT I-STAT 3, ART BLOOD GAS (G3+)
Acid-Base Excess: 2 mmol/L (ref 0.0–2.0)
Acid-Base Excess: 2 mmol/L (ref 0.0–2.0)
Acid-base deficit: 6 mmol/L — ABNORMAL HIGH (ref 0.0–2.0)
Acid-base deficit: 6 mmol/L — ABNORMAL HIGH (ref 0.0–2.0)
Bicarbonate: 21.2 mEq/L (ref 20.0–24.0)
O2 Saturation: 100 %
O2 Saturation: 93 %
O2 Saturation: 96 %
Patient temperature: 33.1
Patient temperature: 35.3
TCO2: 23 mmol/L (ref 0–100)
TCO2: 24 mmol/L (ref 0–100)
TCO2: 28 mmol/L (ref 0–100)
pCO2 arterial: 41.2 mmHg (ref 35.0–45.0)
pH, Arterial: 7.359 (ref 7.350–7.450)
pH, Arterial: 7.344 — ABNORMAL LOW (ref 7.350–7.450)
pO2, Arterial: 243 mmHg — ABNORMAL HIGH (ref 80.0–100.0)
pO2, Arterial: 56 mmHg — ABNORMAL LOW (ref 80.0–100.0)

## 2012-05-20 LAB — POCT I-STAT GLUCOSE
Glucose, Bld: 136 mg/dL — ABNORMAL HIGH (ref 70–99)
Operator id: 3406

## 2012-05-20 LAB — POCT I-STAT, CHEM 8
BUN: 22 mg/dL (ref 6–23)
Chloride: 104 mEq/L (ref 96–112)
Potassium: 3.8 mEq/L (ref 3.5–5.1)
Sodium: 142 mEq/L (ref 135–145)
TCO2: 28 mmol/L (ref 0–100)

## 2012-05-20 LAB — POCT I-STAT 4, (NA,K, GLUC, HGB,HCT)
Glucose, Bld: 181 mg/dL — ABNORMAL HIGH (ref 70–99)
Glucose, Bld: 149 mg/dL — ABNORMAL HIGH (ref 70–99)
HCT: 23 % — ABNORMAL LOW (ref 36.0–46.0)
HCT: 20 % — ABNORMAL LOW (ref 36.0–46.0)
Hemoglobin: 7.8 g/dL — ABNORMAL LOW (ref 12.0–15.0)
Hemoglobin: 7.5 g/dL — ABNORMAL LOW (ref 12.0–15.0)
Hemoglobin: 6.8 g/dL — CL (ref 12.0–15.0)
Hemoglobin: 12.9 g/dL (ref 12.0–15.0)
Potassium: 3.8 mEq/L (ref 3.5–5.1)
Potassium: 3.5 mEq/L (ref 3.5–5.1)
Potassium: 3.4 mEq/L — ABNORMAL LOW (ref 3.5–5.1)
Potassium: 4.7 mEq/L (ref 3.5–5.1)
Potassium: 2.6 mEq/L — CL (ref 3.5–5.1)
Sodium: 139 mEq/L (ref 135–145)
Sodium: 135 mEq/L (ref 135–145)
Sodium: 134 mEq/L — ABNORMAL LOW (ref 135–145)
Sodium: 144 mEq/L (ref 135–145)

## 2012-05-20 LAB — APTT: aPTT: 33 seconds (ref 24–37)

## 2012-05-20 LAB — CBC
HCT: 34.7 % — ABNORMAL LOW (ref 36.0–46.0)
Hemoglobin: 9 g/dL — ABNORMAL LOW (ref 12.0–15.0)
MCH: 31.6 pg (ref 26.0–34.0)
MCH: 30.8 pg (ref 26.0–34.0)
MCHC: 35.7 g/dL (ref 30.0–36.0)
MCHC: 35.7 g/dL (ref 30.0–36.0)
MCV: 86.3 fL (ref 78.0–100.0)
Platelets: 215 10*3/uL (ref 150–400)
Platelets: 127 10*3/uL — ABNORMAL LOW (ref 150–400)
Platelets: 128 10*3/uL — ABNORMAL LOW (ref 150–400)
RBC: 2.85 MIL/uL — ABNORMAL LOW (ref 3.87–5.11)
RDW: 14.9 % (ref 11.5–15.5)
RDW: 15.2 % (ref 11.5–15.5)
WBC: 8.5 10*3/uL (ref 4.0–10.5)
WBC: 14.7 10*3/uL — ABNORMAL HIGH (ref 4.0–10.5)

## 2012-05-20 LAB — CREATININE, SERUM: GFR calc Af Amer: 86 mL/min — ABNORMAL LOW (ref >90)

## 2012-05-20 LAB — HEMOGLOBIN AND HEMATOCRIT, BLOOD: HCT: 23 % — ABNORMAL LOW (ref 36.0–46.0)

## 2012-05-20 LAB — PROTIME-INR
INR: 1.25 (ref 0.00–1.49)
Prothrombin Time: 15.5 seconds — ABNORMAL HIGH (ref 11.6–15.2)

## 2012-05-20 LAB — GLUCOSE, CAPILLARY: Glucose-Capillary: 82 mg/dL (ref 70–99)

## 2012-05-20 LAB — PLATELET COUNT: Platelets: 65 10*3/uL — ABNORMAL LOW (ref 150–400)

## 2012-05-20 SURGERY — REPAIR, MITRAL VALVE, MINIMALLY INVASIVE
Anesthesia: General | Site: Chest | Laterality: Right | Wound class: Clean

## 2012-05-20 MED ORDER — MAGNESIUM SULFATE 40 MG/ML IJ SOLN
INTRAMUSCULAR | Status: AC
  Filled 2012-05-20: qty 100

## 2012-05-20 MED ORDER — SODIUM CHLORIDE 0.9 % IJ SOLN
3.0000 mL | Freq: Two times a day (BID) | INTRAMUSCULAR | Status: DC
  Administered 2012-05-21 – 2012-05-24 (×7): 3 mL via INTRAVENOUS
  Administered 2012-05-25: 22:00:00 via INTRAVENOUS
  Administered 2012-05-26 – 2012-05-27 (×3): 3 mL via INTRAVENOUS

## 2012-05-20 MED ORDER — METOPROLOL TARTRATE 1 MG/ML IV SOLN
2.5000 mg | INTRAVENOUS | Status: DC | PRN
  Administered 2012-05-21: 5 mg via INTRAVENOUS

## 2012-05-20 MED ORDER — SODIUM CHLORIDE 0.9 % IJ SOLN: 3.0000 mL | INTRAMUSCULAR | Status: DC | PRN

## 2012-05-20 MED ORDER — MAGNESIUM SULFATE 40 MG/ML IJ SOLN
4.0000 g | Freq: Once | INTRAMUSCULAR | Status: AC
  Administered 2012-05-20: 4 g via INTRAVENOUS

## 2012-05-20 MED ORDER — ASPIRIN 81 MG PO CHEW: 324.0000 mg | CHEWABLE_TABLET | Freq: Every day | ORAL | Status: DC

## 2012-05-20 MED ORDER — MORPHINE SULFATE 2 MG/ML IJ SOLN
2.0000 mg | INTRAMUSCULAR | Status: DC | PRN
  Administered 2012-05-21: 2 mg via INTRAVENOUS
  Administered 2012-05-21: 1 mg via INTRAVENOUS
  Administered 2012-05-21: 2 mg via INTRAVENOUS
  Administered 2012-05-21: 1 mg via INTRAVENOUS
  Filled 2012-05-20 (×2): qty 1

## 2012-05-20 MED ORDER — BISACODYL 10 MG RE SUPP: 10.0000 mg | Freq: Every day | RECTAL | Status: DC

## 2012-05-20 MED ORDER — MILRINONE IN DEXTROSE 20 MG/100ML IV SOLN
0.1250 ug/kg/min | INTRAVENOUS | Status: DC
  Filled 2012-05-20: qty 100

## 2012-05-20 MED ORDER — FENTANYL CITRATE 0.05 MG/ML IJ SOLN
INTRAMUSCULAR | Status: DC | PRN
  Administered 2012-05-20: 250 ug via INTRAVENOUS
  Administered 2012-05-20: 50 ug via INTRAVENOUS
  Administered 2012-05-20: 250 ug via INTRAVENOUS
  Administered 2012-05-20: 200 ug via INTRAVENOUS
  Administered 2012-05-20: 500 ug via INTRAVENOUS
  Administered 2012-05-20: 250 ug via INTRAVENOUS

## 2012-05-20 MED ORDER — HEPARIN SODIUM (PORCINE) 1000 UNIT/ML IJ SOLN
INTRAMUSCULAR | Status: DC | PRN
  Administered 2012-05-20: 22000 [IU] via INTRAVENOUS

## 2012-05-20 MED ORDER — NITROGLYCERIN IN D5W 200-5 MCG/ML-% IV SOLN
0.0000 ug/min | INTRAVENOUS | Status: DC
  Administered 2012-05-20: 83.3 ug/min via INTRAVENOUS
  Administered 2012-05-21: 60 ug/min via INTRAVENOUS
  Filled 2012-05-20: qty 250

## 2012-05-20 MED ORDER — DOCUSATE SODIUM 100 MG PO CAPS
200.0000 mg | ORAL_CAPSULE | Freq: Every day | ORAL | Status: DC
  Administered 2012-05-21 – 2012-05-27 (×5): 200 mg via ORAL
  Filled 2012-05-20 (×6): qty 2

## 2012-05-20 MED ORDER — SODIUM CHLORIDE 0.9 % IV SOLN: 250.0000 mL | INTRAVENOUS | Status: DC | PRN

## 2012-05-20 MED ORDER — FUROSEMIDE 10 MG/ML IJ SOLN
INTRAMUSCULAR | Status: DC | PRN
  Administered 2012-05-20: 80 mg via INTRAMUSCULAR

## 2012-05-20 MED ORDER — POTASSIUM CHLORIDE 10 MEQ/50ML IV SOLN
10.0000 meq | INTRAVENOUS | Status: AC
  Administered 2012-05-20 (×3): 10 meq via INTRAVENOUS

## 2012-05-20 MED ORDER — PROPOFOL 10 MG/ML IV BOLUS
INTRAVENOUS | Status: DC | PRN
  Administered 2012-05-20: 20 mg via INTRAVENOUS

## 2012-05-20 MED ORDER — DEXMEDETOMIDINE HCL IN NACL 200 MCG/50ML IV SOLN
0.1000 ug/kg/h | INTRAVENOUS | Status: DC
  Administered 2012-05-20 (×2): 0.5 ug/kg/h via INTRAVENOUS
  Filled 2012-05-20: qty 50

## 2012-05-20 MED ORDER — ACETAMINOPHEN 500 MG PO TABS
1000.0000 mg | ORAL_TABLET | Freq: Four times a day (QID) | ORAL | Status: AC
  Administered 2012-05-21 – 2012-05-25 (×17): 1000 mg via ORAL
  Filled 2012-05-20 (×19): qty 2

## 2012-05-20 MED ORDER — METOPROLOL TARTRATE 12.5 MG HALF TABLET
12.5000 mg | ORAL_TABLET | Freq: Two times a day (BID) | ORAL | Status: DC
  Administered 2012-05-21 (×2): 12.5 mg via ORAL
  Filled 2012-05-20 (×5): qty 1

## 2012-05-20 MED ORDER — MILRINONE IN DEXTROSE 20 MG/100ML IV SOLN
0.3000 ug/kg/min | INTRAVENOUS | Status: DC
  Administered 2012-05-20 – 2012-05-21 (×2): 0.3 ug/kg/min via INTRAVENOUS
  Filled 2012-05-20 (×2): qty 100

## 2012-05-20 MED ORDER — INSULIN REGULAR BOLUS VIA INFUSION
0.0000 [IU] | Freq: Three times a day (TID) | INTRAVENOUS | Status: DC
  Administered 2012-05-21: 3 [IU] via INTRAVENOUS
  Administered 2012-05-21: 2 [IU] via INTRAVENOUS
  Filled 2012-05-20: qty 10

## 2012-05-20 MED ORDER — SODIUM CHLORIDE 0.9 % IR SOLN
Status: DC | PRN
  Administered 2012-05-20: 3000 mL
  Administered 2012-05-20: 1000 mL

## 2012-05-20 MED ORDER — DOPAMINE-DEXTROSE 3.2-5 MG/ML-% IV SOLN
0.0000 ug/kg/min | INTRAVENOUS | Status: DC
  Administered 2012-05-20: 1.5 ug/kg/min via INTRAVENOUS

## 2012-05-20 MED ORDER — ALBUMIN HUMAN 5 % IV SOLN
250.0000 mL | INTRAVENOUS | Status: AC | PRN
  Administered 2012-05-20 (×2): 250 mL via INTRAVENOUS
  Filled 2012-05-20: qty 250

## 2012-05-20 MED ORDER — PHENYLEPHRINE HCL 10 MG/ML IJ SOLN
0.0000 ug/min | INTRAVENOUS | Status: DC
  Filled 2012-05-20: qty 2

## 2012-05-20 MED ORDER — DOPAMINE-DEXTROSE 3.2-5 MG/ML-% IV SOLN
INTRAVENOUS | Status: DC | PRN
  Administered 2012-05-20: 3 ug/kg/min via INTRAVENOUS

## 2012-05-20 MED ORDER — SODIUM CHLORIDE 0.9 % IV SOLN
INTRAVENOUS | Status: DC
  Administered 2012-05-20: 5.3 [IU]/h via INTRAVENOUS
  Administered 2012-05-21: 21:00:00 via INTRAVENOUS
  Filled 2012-05-20 (×2): qty 1

## 2012-05-20 MED ORDER — ROCURONIUM BROMIDE 100 MG/10ML IV SOLN
INTRAVENOUS | Status: DC | PRN
  Administered 2012-05-20: 20 mg via INTRAVENOUS
  Administered 2012-05-20: 30 mg via INTRAVENOUS
  Administered 2012-05-20: 50 mg via INTRAVENOUS

## 2012-05-20 MED ORDER — FAMOTIDINE IN NACL 20-0.9 MG/50ML-% IV SOLN
20.0000 mg | Freq: Two times a day (BID) | INTRAVENOUS | Status: AC
  Administered 2012-05-20: 20 mg via INTRAVENOUS

## 2012-05-20 MED ORDER — MORPHINE SULFATE 2 MG/ML IJ SOLN
1.0000 mg | INTRAMUSCULAR | Status: AC | PRN
  Administered 2012-05-21: 1 mg via INTRAVENOUS
  Filled 2012-05-20 (×2): qty 1

## 2012-05-20 MED ORDER — ASPIRIN EC 325 MG PO TBEC
325.0000 mg | DELAYED_RELEASE_TABLET | Freq: Every day | ORAL | Status: DC
  Administered 2012-05-21: 325 mg via ORAL
  Filled 2012-05-20 (×2): qty 1

## 2012-05-20 MED ORDER — ONDANSETRON HCL 4 MG/2ML IJ SOLN: 4.0000 mg | Freq: Four times a day (QID) | INTRAMUSCULAR | Status: DC | PRN

## 2012-05-20 MED ORDER — DEXTROSE 5 % IV SOLN
1.5000 g | Freq: Two times a day (BID) | INTRAVENOUS | Status: AC
  Administered 2012-05-20 – 2012-05-21 (×3): 1.5 g via INTRAVENOUS
  Filled 2012-05-20 (×4): qty 1.5

## 2012-05-20 MED ORDER — METOPROLOL TARTRATE 25 MG/10 ML ORAL SUSPENSION
12.5000 mg | Freq: Two times a day (BID) | ORAL | Status: DC
  Filled 2012-05-20 (×5): qty 5

## 2012-05-20 MED ORDER — LACTATED RINGERS IV SOLN
INTRAVENOUS | Status: DC | PRN
  Administered 2012-05-20: 07:00:00 via INTRAVENOUS

## 2012-05-20 MED ORDER — 0.9 % SODIUM CHLORIDE (POUR BTL) OPTIME
TOPICAL | Status: DC | PRN
  Administered 2012-05-20: 1000 mL

## 2012-05-20 MED ORDER — FUROSEMIDE 10 MG/ML IJ SOLN: 80.0000 mg | Freq: Once | INTRAMUSCULAR | Status: DC

## 2012-05-20 MED ORDER — MIDAZOLAM HCL 5 MG/5ML IJ SOLN
INTRAMUSCULAR | Status: DC | PRN
  Administered 2012-05-20: 2 mg via INTRAVENOUS
  Administered 2012-05-20: 1 mg via INTRAVENOUS
  Administered 2012-05-20: 3 mg via INTRAVENOUS
  Administered 2012-05-20: 4 mg via INTRAVENOUS

## 2012-05-20 MED ORDER — ACETAMINOPHEN 10 MG/ML IV SOLN
1000.0000 mg | Freq: Once | INTRAVENOUS | Status: AC
  Administered 2012-05-20: 1000 mg via INTRAVENOUS
  Filled 2012-05-20: qty 100

## 2012-05-20 MED ORDER — CALCIUM CHLORIDE 10 % IV SOLN
1.0000 g | Freq: Once | INTRAVENOUS | Status: AC | PRN
  Filled 2012-05-20: qty 10

## 2012-05-20 MED ORDER — PROTAMINE SULFATE 10 MG/ML IV SOLN
INTRAVENOUS | Status: DC | PRN
  Administered 2012-05-20: 200 mg via INTRAVENOUS

## 2012-05-20 MED ORDER — BISACODYL 5 MG PO TBEC
10.0000 mg | DELAYED_RELEASE_TABLET | Freq: Every day | ORAL | Status: DC
  Administered 2012-05-21 – 2012-05-23 (×3): 10 mg via ORAL
  Filled 2012-05-20 (×4): qty 2

## 2012-05-20 MED ORDER — VANCOMYCIN HCL IN DEXTROSE 1-5 GM/200ML-% IV SOLN
1000.0000 mg | Freq: Once | INTRAVENOUS | Status: AC
  Administered 2012-05-20: 1000 mg via INTRAVENOUS
  Filled 2012-05-20: qty 200

## 2012-05-20 MED ORDER — MIDAZOLAM HCL 2 MG/2ML IJ SOLN: 2.0000 mg | INTRAMUSCULAR | Status: DC | PRN

## 2012-05-20 MED ORDER — LACTATED RINGERS IV SOLN
INTRAVENOUS | Status: DC
  Administered 2012-05-20: 20 mL/h via INTRAVENOUS

## 2012-05-20 MED ORDER — SODIUM CHLORIDE 0.45 % IV SOLN: INTRAVENOUS | Status: DC

## 2012-05-20 MED ORDER — POTASSIUM CHLORIDE 10 MEQ/50ML IV SOLN
10.0000 meq | INTRAVENOUS | Status: AC
  Administered 2012-05-20 (×2): 10 meq via INTRAVENOUS

## 2012-05-20 MED ORDER — TRAMADOL HCL 50 MG PO TABS
50.0000 mg | ORAL_TABLET | ORAL | Status: DC | PRN
  Administered 2012-05-21: 50 mg via ORAL
  Administered 2012-05-22 – 2012-05-24 (×5): 100 mg via ORAL
  Administered 2012-05-26: 50 mg via ORAL
  Administered 2012-05-27: 100 mg via ORAL
  Filled 2012-05-20: qty 2
  Filled 2012-05-20: qty 1
  Filled 2012-05-20: qty 2
  Filled 2012-05-20: qty 1
  Filled 2012-05-20 (×4): qty 2

## 2012-05-20 MED ORDER — PANTOPRAZOLE SODIUM 40 MG PO TBEC
40.0000 mg | DELAYED_RELEASE_TABLET | Freq: Every day | ORAL | Status: DC
  Administered 2012-05-22 – 2012-05-27 (×6): 40 mg via ORAL
  Filled 2012-05-20 (×7): qty 1

## 2012-05-20 MED ORDER — SODIUM CHLORIDE 0.9 % IV SOLN: INTRAVENOUS | Status: DC

## 2012-05-20 MED ORDER — ACETAMINOPHEN 160 MG/5ML PO SOLN: 975.0000 mg | Freq: Four times a day (QID) | ORAL | Status: DC

## 2012-05-20 SURGICAL SUPPLY — 135 items
ADAPTER CARDIO PERF ANTE/RETRO (ADAPTER) ×4 IMPLANT
ATTRACTOMAT 16X20 MAGNETIC DRP (DRAPES) ×4 IMPLANT
BAG DECANTER FOR FLEXI CONT (MISCELLANEOUS) ×4 IMPLANT
BENZOIN TINCTURE PRP APPL 2/3 (GAUZE/BANDAGES/DRESSINGS) ×4 IMPLANT
BLADE SURG 11 STRL SS (BLADE) ×4 IMPLANT
BLADE SURG ROTATE 9660 (MISCELLANEOUS) ×4 IMPLANT
BLOOD HAEMOCONCENTR 700 MIDI (MISCELLANEOUS) ×4 IMPLANT
CANISTER SUCTION 2500CC (MISCELLANEOUS) ×8 IMPLANT
CANNULA FEM VENOUS REMOTE 22FR (CANNULA) ×4 IMPLANT
CANNULA FEMORAL ART 14 SM (MISCELLANEOUS) ×4 IMPLANT
CANNULA GUNDRY RCSP 15FR (MISCELLANEOUS) ×4 IMPLANT
CANNULA OPTISITE PERFUSION 16F (CANNULA) ×8 IMPLANT
CANNULA OPTISITE PERFUSION 18F (CANNULA) IMPLANT
CARDIAC SUCTION (MISCELLANEOUS) ×4 IMPLANT
CARDIOBLATE CARDIAC ABLATION (MISCELLANEOUS)
CATH KIT ON Q 5IN SLV (PAIN MANAGEMENT) IMPLANT
CLOTH BEACON ORANGE TIMEOUT ST (SAFETY) ×4 IMPLANT
CONN ST 1/4X3/8  BEN (MISCELLANEOUS) ×2
CONN ST 1/4X3/8 BEN (MISCELLANEOUS) ×6 IMPLANT
CONNECTOR 1/2X3/8X1/2 3 WAY (MISCELLANEOUS) ×1
CONNECTOR 1/2X3/8X1/2 3WAY (MISCELLANEOUS) ×3 IMPLANT
CONT SPEC 4OZ CLIKSEAL STRL BL (MISCELLANEOUS) ×4 IMPLANT
CONT SPEC STER OR (MISCELLANEOUS) ×4 IMPLANT
COVER MAYO STAND STRL (DRAPES) ×8 IMPLANT
COVER SURGICAL LIGHT HANDLE (MISCELLANEOUS) ×8 IMPLANT
COVER TABLE BACK 60X90 (DRAPES) ×4 IMPLANT
CRADLE DONUT ADULT HEAD (MISCELLANEOUS) ×4 IMPLANT
DERMABOND ADVANCED (GAUZE/BANDAGES/DRESSINGS) ×2
DERMABOND ADVANCED .7 DNX12 (GAUZE/BANDAGES/DRESSINGS) ×6 IMPLANT
DEVICE CARDIOBLATE CARDIAC ABL (MISCELLANEOUS) IMPLANT
DEVICE PMI PUNCTURE CLOSURE (MISCELLANEOUS) ×4 IMPLANT
DEVICE TROCAR PUNCTURE CLOSURE (ENDOMECHANICALS) ×4 IMPLANT
DRAIN CHANNEL 28F RND 3/8 FF (WOUND CARE) ×8 IMPLANT
DRAPE BILATERAL SPLIT (DRAPES) ×4 IMPLANT
DRAPE C-ARM 42X72 X-RAY (DRAPES) ×8 IMPLANT
DRAPE CV SPLIT W-CLR ANES SCRN (DRAPES) ×4 IMPLANT
DRAPE INCISE IOBAN 66X45 STRL (DRAPES) ×8 IMPLANT
DRAPE SLUSH MACHINE 52X66 (DRAPES) IMPLANT
DRAPE SLUSH/WARMER DISC (DRAPES) ×4 IMPLANT
DRSG COVADERM 4X8 (GAUZE/BANDAGES/DRESSINGS) ×4 IMPLANT
ELECT BLADE 6.5 EXT (BLADE) ×8 IMPLANT
ELECT REM PT RETURN 9FT ADLT (ELECTROSURGICAL) ×8
ELECTRODE REM PT RTRN 9FT ADLT (ELECTROSURGICAL) ×6 IMPLANT
FEMORAL VENOUS CANN RAP (CANNULA) IMPLANT
GLOVE BIO SURGEON STRL SZ 6 (GLOVE) ×20 IMPLANT
GLOVE BIOGEL PI IND STRL 6 (GLOVE) ×15 IMPLANT
GLOVE BIOGEL PI IND STRL 6.5 (GLOVE) ×15 IMPLANT
GLOVE BIOGEL PI IND STRL 7.0 (GLOVE) ×3 IMPLANT
GLOVE BIOGEL PI IND STRL 8 (GLOVE) ×3 IMPLANT
GLOVE BIOGEL PI INDICATOR 6 (GLOVE) ×5
GLOVE BIOGEL PI INDICATOR 6.5 (GLOVE) ×5
GLOVE BIOGEL PI INDICATOR 7.0 (GLOVE) ×1
GLOVE BIOGEL PI INDICATOR 8 (GLOVE) ×1
GLOVE ORTHO TXT STRL SZ7.5 (GLOVE) ×12 IMPLANT
GOWN STRL NON-REIN LRG LVL3 (GOWN DISPOSABLE) ×32 IMPLANT
GUIDEWIRE ANG ZIPWIRE 038X150 (WIRE) ×4 IMPLANT
INSERT CONFORM CROSS CLAMP 66M (MISCELLANEOUS) IMPLANT
INSERT CONFORM CROSS CLAMP 86M (MISCELLANEOUS) IMPLANT
KIT BASIN OR (CUSTOM PROCEDURE TRAY) ×4 IMPLANT
KIT DILATOR VASC 18G NDL (KITS) ×4 IMPLANT
KIT DRAINAGE VACCUM ASSIST (KITS) ×4 IMPLANT
KIT ROOM TURNOVER OR (KITS) ×4 IMPLANT
KIT SUCTION CATH 14FR (SUCTIONS) ×4 IMPLANT
LEAD PACING MYOCARDI (MISCELLANEOUS) ×4 IMPLANT
LINE VENT (MISCELLANEOUS) ×4 IMPLANT
NEEDLE AORTIC ROOT 14G 7F (CATHETERS) ×4 IMPLANT
NS IRRIG 1000ML POUR BTL (IV SOLUTION) ×24 IMPLANT
PACK OPEN HEART (CUSTOM PROCEDURE TRAY) ×4 IMPLANT
PAD ARMBOARD 7.5X6 YLW CONV (MISCELLANEOUS) ×8 IMPLANT
PAD ELECT DEFIB RADIOL ZOLL (MISCELLANEOUS) ×4 IMPLANT
PATCH CORMATRIX 4CMX7CM (Prosthesis & Implant Heart) ×4 IMPLANT
PROBE CRYO2-ABLATION MALLABLE (MISCELLANEOUS) ×8 IMPLANT
RETRACTOR PVM SOFT TISSUE M (INSTRUMENTS) ×4 IMPLANT
RETRACTOR TRL SOFT TISSUE LG (INSTRUMENTS) IMPLANT
RETRACTOR TRM SOFT TISSUE 7.5 (INSTRUMENTS) IMPLANT
RING MITRAL MEMO 3D 26MM SMD26 (Prosthesis & Implant Heart) ×4 IMPLANT
SET CANNULATION TOURNIQUET (MISCELLANEOUS) ×4 IMPLANT
SET CARDIOPLEGIA MPS 5001102 (MISCELLANEOUS) ×4 IMPLANT
SET IRRIG TUBING LAPAROSCOPIC (IRRIGATION / IRRIGATOR) ×4 IMPLANT
SOLUTION ANTI FOG 6CC (MISCELLANEOUS) ×4 IMPLANT
SPONGE GAUZE 4X4 12PLY (GAUZE/BANDAGES/DRESSINGS) ×4 IMPLANT
SUCKER WEIGHTED FLEX (MISCELLANEOUS) ×8 IMPLANT
SUT BONE WAX W31G (SUTURE) ×4 IMPLANT
SUT E-PACK MINIMALLY INVASIVE (SUTURE) ×4 IMPLANT
SUT ETHIBOND (SUTURE) ×12 IMPLANT
SUT ETHIBOND 2 0 SH (SUTURE) IMPLANT
SUT ETHIBOND 2 0 V4 (SUTURE) IMPLANT
SUT ETHIBOND 2 0V4 GREEN (SUTURE) IMPLANT
SUT ETHIBOND 2-0 RB-1 WHT (SUTURE) ×12 IMPLANT
SUT ETHIBOND 4 0 TF (SUTURE) IMPLANT
SUT ETHIBOND 5 0 C 1 30 (SUTURE) IMPLANT
SUT ETHIBOND NAB MH 2-0 36IN (SUTURE) IMPLANT
SUT ETHIBOND X763 2 0 SH 1 (SUTURE) ×4 IMPLANT
SUT GORETEX 6.0 TH-9 30 IN (SUTURE) IMPLANT
SUT GORETEX CV 4 TH 22 36 (SUTURE) ×4 IMPLANT
SUT GORETEX CV-5THC-13 36IN (SUTURE) IMPLANT
SUT GORETEX CV4 TH-18 (SUTURE) ×8 IMPLANT
SUT GORETEX TH-18 36 INCH (SUTURE) IMPLANT
SUT MNCRL AB 3-0 PS2 18 (SUTURE) IMPLANT
SUT PROLENE 3 0 SH DA (SUTURE) ×8 IMPLANT
SUT PROLENE 3 0 SH1 36 (SUTURE) IMPLANT
SUT PROLENE 4 0 RB 1 (SUTURE)
SUT PROLENE 4-0 RB1 .5 CRCL 36 (SUTURE) IMPLANT
SUT PROLENE 5 0 C 1 36 (SUTURE) IMPLANT
SUT PROLENE 6 0 C 1 30 (SUTURE) ×8 IMPLANT
SUT SILK  1 MH (SUTURE)
SUT SILK 1 MH (SUTURE) IMPLANT
SUT SILK 1 TIES 10X30 (SUTURE) IMPLANT
SUT SILK 2 0 SH CR/8 (SUTURE) IMPLANT
SUT SILK 2 0 TIES 10X30 (SUTURE) IMPLANT
SUT SILK 2 0SH CR/8 30 (SUTURE) IMPLANT
SUT SILK 3 0 (SUTURE)
SUT SILK 3 0 SH CR/8 (SUTURE) IMPLANT
SUT SILK 3 0SH CR/8 30 (SUTURE) IMPLANT
SUT SILK 3-0 18XBRD TIE 12 (SUTURE) IMPLANT
SUT TEM PAC WIRE 2 0 SH (SUTURE) IMPLANT
SUT VIC AB 2-0 CTX 36 (SUTURE) IMPLANT
SUT VIC AB 2-0 UR6 27 (SUTURE) IMPLANT
SUT VIC AB 3-0 SH 8-18 (SUTURE) ×4 IMPLANT
SUT VICRYL 2 TP 1 (SUTURE) IMPLANT
SYRINGE 10CC LL (SYRINGE) ×4 IMPLANT
SYS ATRICLIP LAA EXCLUSION 45 (CLIP) IMPLANT
SYSTEM SAHARA CHEST DRAIN ATS (WOUND CARE) ×8 IMPLANT
TAPE CLOTH SURG 4X10 WHT LF (GAUZE/BANDAGES/DRESSINGS) ×4 IMPLANT
TAPE PAPER 2X10 WHT MICROPORE (GAUZE/BANDAGES/DRESSINGS) ×4 IMPLANT
TOWEL OR 17X24 6PK STRL BLUE (TOWEL DISPOSABLE) ×4 IMPLANT
TOWEL OR 17X26 10 PK STRL BLUE (TOWEL DISPOSABLE) ×4 IMPLANT
TRAY FOLEY IC TEMP SENS 14FR (CATHETERS) ×4 IMPLANT
TROCAR XCEL BLADELESS 5X75MML (TROCAR) ×4 IMPLANT
TROCAR XCEL NON-BLD 11X100MML (ENDOMECHANICALS) ×8 IMPLANT
TUBE SUCT INTRACARD DLP 20F (MISCELLANEOUS) ×4 IMPLANT
TUNNELER SHEATH ON-Q 11GX8 (MISCELLANEOUS) IMPLANT
UNDERPAD 30X30 INCONTINENT (UNDERPADS AND DIAPERS) ×4 IMPLANT
WATER STERILE IRR 1000ML POUR (IV SOLUTION) ×8 IMPLANT
WIRE BENTSON .035X145CM (WIRE) ×4 IMPLANT

## 2012-05-20 NOTE — Transfer of Care (Signed)
Immediate Anesthesia Transfer of Care Note  Patient: Snow Peoples  Procedure(s) Performed: Procedure(s): MINIMALLY INVASIVE MITRAL VALVE REPAIR (MVR) (Right) MINIMALLY INVASIVE MAZE PROCEDURE (N/A) INTRAOPERATIVE TRANSESOPHAGEAL ECHOCARDIOGRAM (N/A)  Patient Location: SICU  Anesthesia Type:General  Level of Consciousness: unresponsive and Patient remains intubated per anesthesia plan  Airway & Oxygen Therapy: Patient remains intubated per anesthesia plan and Patient placed on Ventilator (see vital sign flow sheet for setting)  Post-op Assessment: Report given to SICU nurse  Post vital signs: Reviewed and stable  Complications: No apparent anesthesia complications

## 2012-05-20 NOTE — Progress Notes (Signed)
Patient ID: [name] [name], female   DOB: 1924-12-30, 79 y.o.   MRN: [id number]  SICU Evening Rounds:  Hemodynamically stable.  CI 1.7 on dop 1.5 and Milrinone 0.3  Still asleep on vent  Urine output good  CT output low.  CBC    Component Value Date/Time   WBC 14.7* 05/20/2012 1541   RBC 4.00 05/20/2012 1541   HGB 12.4 05/20/2012 1541   HCT 34.7* 05/20/2012 1541   PLT 127* 05/20/2012 1541   MCV 86.8 05/20/2012 1541   MCH 31.0 05/20/2012 1541   MCHC 35.7 05/20/2012 1541   RDW 14.9 05/20/2012 1541   LYMPHSABS 0.8 05/06/2012 0229   MONOABS 1.2* 05/06/2012 0229   EOSABS 0.0 05/06/2012 0229   BASOSABS 0.0 05/06/2012 0229    BMET    Component Value Date/Time   NA 144 05/20/2012 1540   K 2.6* 05/20/2012 1540   CL 107 05/20/2012 0348   CO2 24 05/20/2012 0348   GLUCOSE 149* 05/20/2012 1540   BUN 32* 05/20/2012 0348   CREATININE 0.89 05/20/2012 0348   CALCIUM 8.5 05/20/2012 0348   GFRNONAA 57* 05/20/2012 0348   GFRAA 67* 05/20/2012 0348    Repleating K+ Wean vent one warm.

## 2012-05-20 NOTE — Anesthesia Procedure Notes (Signed)
Procedure Name: Intubation Date/Time: 05/20/2012 8:20 AM Performed by: Coralee Rud Pre-anesthesia Checklist: Patient identified, Emergency Drugs available, Suction available and Patient being monitored Patient Re-evaluated:Patient Re-evaluated prior to inductionOxygen Delivery Method: Circle system utilized Preoxygenation: Pre-oxygenation with 100% oxygen Intubation Type: IV induction Ventilation: Mask ventilation without difficulty and Oral airway inserted - appropriate to patient size Laryngoscope Size: Miller and 3 Grade View: Grade I Endobronchial tube: Left and 35 Fr Airway Equipment and Method: Stylet and Fiberoptic brochoscope Placement Confirmation: ETT inserted through vocal cords under direct vision,  positive ETCO2 and breath sounds checked- equal and bilateral Secured at: 29 cm Tube secured with: Tape Dental Injury: Teeth and Oropharynx as per pre-operative assessment

## 2012-05-20 NOTE — Op Note (Signed)
CARDIOTHORACIC SURGERY OPERATIVE NOTE  Date of Procedure:  05/20/2012  Preoperative Diagnosis:   Severe Mitral Regurgitation  Recurrent Persistent Atrial Fibrillation  Postoperative Diagnosis: Same  Procedure:   Minimally-Invasive Mitral Valve Repair  Sorin Memo 3D Ring Annuloplasty (size 26mm, catalog #[id number], serial [id number])  Maze Procedure  Complete biatrial lesion set using cryothermy  Surgeon: Salvatore Decent. Cornelius Moras, MD  Assistant: Lowella Dandy, PA-C  Anesthesia: Germaine Pomfret, MD  Operative Findings:  Normal LV size and systolic function  Type I mitral valve dysfunction with moderate-severe mitral regurgitation  Mild-moderate (1-2+) central aortic insufficiency  Moderate biatrial enlargement  No residual mitral regurgitation following successful valve repair              BRIEF CLINICAL NOTE AND INDICATIONS FOR SURGERY  Patient is an 79 year old widowed white female from [place] With history of long-standing hypertension, type 2 diabetes mellitus, hyperlipidemia, nonobstructive coronary artery disease, and arthritis. The patient states that recently she has been followed by Dr. Dulce Sellar in [place].  Her husband passed away 3 years ago, in approximately one year later she suffered a syncopal episode complicated by a fall associated with closed head injury that was further complicated by the fact that she was taking Plavix at the time. She was treated for several days in [place] and discharged to a skilled nursing facility for rehabilitation. She recovered from this fairly well although she states that ever since then she has been walking using a cane to steady herself.  She has remained functionally independent and still drives an automobile short distances, but she admits that she lives a fairly sedentary lifestyle and she does not exerts of much physically. Over the last several weeks prior to admission patient developed fairly rapid onset of shortness of  breath. She also developed some dizziness and palpitations as well as a dry nonproductive cough. She has never had any chest pain.  Ultimately she was taken to her primary care physician who noted that she was in rapid atrial fibrillation and promptly sent her to the emergency department at [facility] for admission.  There she was noted to have mild pulmonary edema and bilateral pleural effusions. CT angiogram of the chest was notable for the absence of pulmonary embolus and confirmed the presence of pulmonary edema with moderate bilateral pleural effusions.  While in the emergency department she apparently received intravenous diltiazem and is erythromycin. The patient also was given Benadryl and shortly after developed agonal breathing and became unresponsive. She was promptly resuscitated although she did receive a brief period of CPR. She was intubated and sedated and transported directly to [facility] for further management.  She ruled out for acute myocardial infarction and was treated with rate control and diuretic therapy for rapid atrial fibrillation and class IV congestive heart failure.  She was initially treated with empiric and blotted for possible community-acquired pneumonia, although she has subsequently never revealed any clinical signs suggestive of an infectious process. She rapidly recovered from her acute respiratory arrest was extubated uneventfully. Transthoracic and subsequent transesophageal echocardiograms demonstrate severe mitral regurgitation with normal left ventricular systolic function. Left and right heart catheterization is notable for the absence of significant coronary artery disease. Cardiothoracic surgical consultation was requested.  The patient has been seen in consultation and counseled at length regarding the indications, risks and potential benefits of surgery.  All questions have been answered, and the patient provides full informed consent for the operation as  described.      DETAILS OF THE  OPERATIVE PROCEDURE  The patient is brought to the operating room on the above mentioned date and central monitoring was established by the anesthesia team including placement of Swan-Ganz catheter through the left internal jugular vein.  A radial arterial line is placed. The patient is placed in the supine position on the operating table.  Intravenous antibiotics are administered. General endotracheal anesthesia is induced uneventfully. The patient is initially intubated using a dual lumen endotracheal tube.  A Foley catheter is placed.  Baseline transesophageal echocardiogram was performed.  Findings were notable for type I mitral valve dysfunction with pure annular dilatation and moderate to severe mitral regurgitation.  There was normal LV size and systolic function.  There was mild to moderate (1+/2+) aortic insufficiency.  There was moderate bi-atrial enlargement.  RV size and function were normal.  There was mild tricuspid regurgitation.  A soft roll is placed behind the patient's left scapula and the neck gently extended and turned to the left.   The patient's right neck, chest, abdomen, both groins, and both lower extremities are prepared and draped in a sterile manner. A time out procedure is performed.  A small incision is made in the right inguinal crease and the anterior surface of the right common femoral artery and right common femoral vein are identified.  A right miniature anterolateral thoracotomy incision is performed. The incision is placed just lateral to and superior to the right nipple. The pectoralis major muscle is retracted medially and completely preserved. The right pleural space is entered through the third intercostal space. A soft tissue retractor is placed.  Two 11 mm ports are placed through separate stab incisions inferiorly. The right pleural space is insufflated continuously with carbon dioxide gas through the posterior port during the  remainder of the operation.  A pledgeted sutures placed through the dome of the right hemidiaphragm and retracted inferiorly to facilitate exposure.  A longitudinal incision is made in the pericardium 3 cm anterior to the phrenic nerve and silk traction sutures are placed on either side of the incision for exposure.  The patient is placed in Trendelenburg position. The right internal jugular vein is cannulated with Seldinger technique and a guidewire advanced into the right atrium. The patient is heparinized systemically. The right internal jugular vein is cannulated with a 14 [place] pediatric femoral venous cannula. Pursestring sutures are placed on the anterior surface of the right common femoral vein and right common femoral artery. The right common femoral vein is cannulated with the Seldinger technique and a guidewire is advanced under transesophageal echocardiogram guidance through the right atrium. The femoral vein is cannulated with a long 22 French femoral venous cannula. The right common femoral artery is cannulated with Seldinger technique and a flexible guidewire is advanced until it can be appreciated intraluminally in the descending thoracic aorta on transesophageal echocardiogram. The femoral artery is cannulated with an 18 French femoral arterial cannula.  Adequate heparinization is verified.      The entire pre-bypass portion of the operation was notable for stable hemodynamics.  Cardiopulmonary bypass was begun.  Vacuum assist venous drainage is utilized. The incision in the pericardium is extended in both directions. Venous drainage and exposure are notably excellent.   The Atricure cryothermy system is utilized for all cryothermy ablation lesions for the Cox cryomaze procedure.  All cryothermy lesions are created using a minimum of 2 to 3 minutes duration at -60 degrees F.  The right atrial lateral wall lesions are placed including a longitudinal line along  the lateral wall from the  superior vena cava to the inferior vena cava.  A second lesion is then placed extending from the midportion of the first lesion in an anterior and inferior direction to reach the acute margin of the heart.  The patient's ascending aorta appears moderately diseased and frail.  Therefore a decision is made to perform the procedure under cold fibrillation arrest rather than using an aortic crossclamp.  A bipolar pacing wire is placed on the inferior surface of the right ventricle.  The patient is cooled to 28C systemic temperature.  Rapid ventricular pacing is used to initiate ventricular fibrillation.  A left atriotomy incision was performed through the interatrial groove and extended partially across the back wall of the left atrium after opening the oblique sinus inferiorly.  The mitral valve is exposed using a self-retaining retractor.  The mitral valve was inspected and notable for mild leaflet sclerosis but otherwise fairly normal mitral apparatus other than a dilated annulus .  The left atrial lesion set of the Cox cryomaze procedure is now performed using 3 minute duration for all cryothermy lesions.  Initially a lesion is placed along the endocardial surface of the left atrium from the caudad apex of the atriotomy incision across the posterior wall of the left atrium onto the posterior mitral annulus.  A mirror image lesion along the epicardial surface is then performed with the probe posterior to the left atrium, crossing over the coronary sinus.  Two lesions are then performed to create a box isolating all of the pulmonary veins from the remainder of the left atrium.  The first lesion is placed from the cephalad apex of the atriotomy incision across the dome of the left atrium to just anterior to the left sided pulmonary veins.  The second lesion completes the box from the caudad apex of the atriotomy incision across the back wall of the left atrium to connect with the previous lesion just anterior to  the left sided pulmonary veins.    Interrupted 2-0 Ethibond horizontal mattress sutures are placed circumferentially around the entire mitral valve annulus.  The valve is tested with saline and appears reasonably competent even prior to ring annuloplasty.  The valve is sized to accept a 26mm annuloplasty ring based upon the distance between the left and right commissures, the height and the surface area of the anterior leaflet.  A Sorin Memo 3D annuloplasty ring (size 26mm, catalog # [id number], serial # [id number]) is implanted uneventfully.  The valve is tested with saline and appears to be perfectly competent with a broad symmetrical line of coaptation of the anterior and posterior leaflet. There is no residual leak. Rewarming is begun.  The atriotomy was closed using a 2-layer closure of running 3-0 Prolene suture after placing a sump drain across the mitral valve to serve as a left ventricular vent.  The patient spontaneously converts out of ventricular fibrillation into sinus bradycardia and junctional rhythm.  Total fibrillation arrest time was 103 minutes.   A right atrial cryothermy lesion was performed by placing the cryothermy probe through the small incision in the right atrium close to the acute margin along the line of the previously placed oblique cryothermy lesion.  The probe is placed along the endocardial surface to cross the acute margin to the tricuspid annulus at the 2 o'clock position.  The two small right atriotomy was closed.  Epicardial pacing wires are fixed to the inferior wall of the right ventricule and to the right atrial appendage.  The patient is rewarmed to 37C temperature. The left ventricular vent is removed.  The patient is ventilated and flow volumes turndown while the mitral valve repair is inspected using transesophageal echocardiogram. The valve repair appears intact with no residual leak. The antegrade cardioplegia cannula is now removed. The patient is weaned and  disconnected from cardiopulmonary bypass.  The patient's rhythm at separation from bypass was atrial paced.  The patient was weaned from bypass without any inotropic support. Total cardiopulmonary bypass time for the operation was 165 minutes.  Followup transesophageal echocardiogram performed after separation from bypass revealed a well-seated annuloplasty ring in the mitral position with a normal functioning mitral valve. There was no residual leak.  Left ventricular function was unchanged from preoperatively.  The aortic insufficiency was unchanged.  The femoral arterial and venous cannulae were removed uneventfully. There was a palpable pulse in the distal right common femoral artery after removal of the cannula. Protamine was administered to reverse the anticoagulation. The right internal jugular cannula was removed and manual pressure held on the neck for 15 minutes.  Single lung ventilation was begun. The atriotomy closure was inspected for hemostasis. The pericardial sac was drained using a 28 French Bard drain placed through the anterior port incision.  The pericardium was closed using a patch of core matrix bovine submucosal tissue patch. The right pleural space is irrigated with saline solution and inspected for hemostasis. The right pleural space was drained using a 28 French Bard drain placed through the posterior port incision. The miniature thoracotomy incision was closed in multiple layers in routine fashion. The right groin incision was inspected for hemostasis and closed in multiple layers in routine fashion.  The post-bypass portion of the operation was notable for stable rhythm and hemodynamics.  The patient was transfused a total of 4 units PRBC's during cardiopulmonary bypass due to anemia that was present preoperatively and exacerbated by hemodilution.  The patient tolerated the procedure well.  The patient was reintubated using a single lumen endotracheal tube and subsequently  transported to the surgical intensive care unit in stable condition. There were no intraoperative complications. All sponge instrument and needle counts are verified correct at completion of the operation.    Salvatore Decent. Cornelius Moras MD 05/20/2012 3:38 PM

## 2012-05-20 NOTE — Brief Op Note (Addendum)
05/06/2012 - 05/20/2012  12:41 PM  PATIENT:  [name] [name]  79 y.o. female  PRE-OPERATIVE DIAGNOSIS:  MR  POST-OPERATIVE DIAGNOSIS:  MR  PROCEDURE:  Procedure(s):  MINIMALLY INVASIVE MITRAL VALVE REPAIR  -Annuloplasty with a Sorin 26 mm Memo 3D Ring  MINIMALLY INVASIVE MAZE PROCEDURE (N/A) -Complete Bi-Atrial Lesion set with Cryothermy  INTRAOPERATIVE TRANSESOPHAGEAL ECHOCARDIOGRAM (N/A)  SURGEON:    Purcell Nails, MD  ASSISTANTS:  Lowella Dandy, PA-C  ANESTHESIA:   Germaine Pomfret, MD  CROSSCLAMP TIME:   0  CARDIOPULMONARY BYPASS TIME: 165'  FINDINGS:  Normal LV size and systolic function  Type I mitral valve dysfunction with moderate-severe mitral regurgitation  Mild-moderate (1-2+) central aortic insufficiency  Moderate biatrial enlargement  No residual mitral regurgitation following successful valve repair  COMPLICATIONS: none  PATIENT DISPOSITION:   TO SICU IN STABLE CONDITION  [name],[name] 05/20/2012 2:27 PM

## 2012-05-20 NOTE — Anesthesia Preprocedure Evaluation (Addendum)
Anesthesia Evaluation  Patient identified by MRN, date of birth, ID band Patient awake    Reviewed: Allergy & Precautions, H&P , NPO status , Patient's Chart, lab work & pertinent test results  History of Anesthesia Complications Negative for: history of anesthetic complications  Airway Mallampati: II TM Distance: >3 FB Neck ROM: Full    Dental  (+) Dental Advisory Given   Pulmonary shortness of breath and with exertion,  breath sounds clear to auscultation  Pulmonary exam normal       Cardiovascular hypertension, Pt. on medications + CAD (non-obstructive ASCADz) + dysrhythmias Atrial Fibrillation + Valvular Problems/Murmurs (TEE: normal LVF, severe MR with flow reversal) MR Rhythm:Irregular Rate:Normal     Neuro/Psych negative neurological ROS  negative psych ROS   GI/Hepatic negative GI ROS, Neg liver ROS,   Endo/Other  diabetes (glu 126), Well Controlled, Type 1  Renal/GU negative Renal ROS     Musculoskeletal   Abdominal   Peds  Hematology   Anesthesia Other Findings   Reproductive/Obstetrics                         Anesthesia Physical Anesthesia Plan  ASA: IV  Anesthesia Plan: General   Post-op Pain Management:    Induction: Intravenous  Airway Management Planned: Oral ETT and Double Lumen EBT  Additional Equipment: Arterial line, CVP, PA Cath, TEE and 3D TEE  Intra-op Plan:   Post-operative Plan: Post-operative intubation/ventilation  Informed Consent: I have reviewed the patients History and Physical, chart, labs and discussed the procedure including the risks, benefits and alternatives for the proposed anesthesia with the patient or authorized representative who has indicated his/her understanding and acceptance.   Dental advisory given  Plan Discussed with: CRNA, Anesthesiologist and Surgeon  Anesthesia Plan Comments: (Plan routine monitors, A line, PA catheter, GETA  with TEE and post op ventilation  )       Anesthesia Quick Evaluation

## 2012-05-20 NOTE — Anesthesia Postprocedure Evaluation (Signed)
  Anesthesia Post-op Note  Patient: [name] [name]  Procedure(s) Performed: Procedure(s): MINIMALLY INVASIVE MITRAL VALVE REPAIR (MVR) (Right) MINIMALLY INVASIVE MAZE PROCEDURE (N/A) INTRAOPERATIVE TRANSESOPHAGEAL ECHOCARDIOGRAM (N/A)  Patient Location: SICU  Anesthesia Type:General  Level of Consciousness: unresponsive and Patient remains intubated per anesthesia plan  Airway and Oxygen Therapy: Patient remains intubated per anesthesia plan and Patient placed on Ventilator (see vital sign flow sheet for setting)  Post-op Pain: none  Post-op Assessment: Post-op Vital signs reviewed, Patient's Cardiovascular Status Stable and Respiratory Function Stable  Post-op Vital Signs: Reviewed and stable  Complications: No apparent anesthesia complications

## 2012-05-20 NOTE — Progress Notes (Signed)
  Echocardiogram Echocardiogram Transesophageal has been performed.  Ellender Hose A 05/20/2012, 9:16 AM

## 2012-05-20 NOTE — Progress Notes (Signed)
PT Cancellation Note  Patient Details Name: [name] [name] MRN: [id number] DOB: 01-12-1925   Cancelled Treatment:    Reason Eval/Treat Not Completed: Patient at procedure or test/unavailable (pt in OR for MVR today)   Delaney Meigs, PT [phone]

## 2012-05-21 ENCOUNTER — Inpatient Hospital Stay (HOSPITAL_COMMUNITY): Payer: Medicare Other

## 2012-05-21 LAB — BASIC METABOLIC PANEL
BUN: 23 mg/dL (ref 6–23)
Calcium: 8 mg/dL — ABNORMAL LOW (ref 8.4–10.5)
GFR calc Af Amer: 68 mL/min — ABNORMAL LOW (ref >90)
GFR calc non Af Amer: 59 mL/min — ABNORMAL LOW (ref >90)
Glucose, Bld: 132 mg/dL — ABNORMAL HIGH (ref 70–99)

## 2012-05-21 LAB — CBC
HCT: 30.7 % — ABNORMAL LOW (ref 36.0–46.0)
HCT: 32.8 % — ABNORMAL LOW (ref 36.0–46.0)
Hemoglobin: 10.8 g/dL — ABNORMAL LOW (ref 12.0–15.0)
MCH: 30.5 pg (ref 26.0–34.0)
MCHC: 35.2 g/dL (ref 30.0–36.0)
RDW: 15.3 % (ref 11.5–15.5)
RDW: 16 % — ABNORMAL HIGH (ref 11.5–15.5)
WBC: 14.4 10*3/uL — ABNORMAL HIGH (ref 4.0–10.5)

## 2012-05-21 LAB — POCT I-STAT 3, ART BLOOD GAS (G3+)
Acid-Base Excess: 5 mmol/L — ABNORMAL HIGH (ref 0.0–2.0)
Patient temperature: 36.9
pCO2 arterial: 38.2 mmHg (ref 35.0–45.0)
pH, Arterial: 7.464 — ABNORMAL HIGH (ref 7.350–7.450)
pO2, Arterial: 70 mmHg — ABNORMAL LOW (ref 80.0–100.0)

## 2012-05-21 LAB — GLUCOSE, CAPILLARY

## 2012-05-21 LAB — CREATININE, SERUM
GFR calc Af Amer: 57 mL/min — ABNORMAL LOW (ref >90)
GFR calc non Af Amer: 49 mL/min — ABNORMAL LOW (ref >90)

## 2012-05-21 LAB — MAGNESIUM: Magnesium: 2.3 mg/dL (ref 1.5–2.5)

## 2012-05-21 LAB — PREPARE FRESH FROZEN PLASMA: Unit division: 0

## 2012-05-21 LAB — POCT I-STAT, CHEM 8
BUN: 21 mg/dL (ref 6–23)
Creatinine, Ser: 1 mg/dL (ref 0.50–1.10)
Glucose, Bld: 113 mg/dL — ABNORMAL HIGH (ref 70–99)
Hemoglobin: 10.9 g/dL — ABNORMAL LOW (ref 12.0–15.0)
Potassium: 4 mEq/L (ref 3.5–5.1)

## 2012-05-21 LAB — PREPARE PLATELET PHERESIS: Unit division: 0

## 2012-05-21 MED ORDER — SODIUM CHLORIDE 0.9 % IV SOLN: INTRAVENOUS | Status: DC

## 2012-05-21 MED ORDER — INSULIN DETEMIR 100 UNIT/ML ~~LOC~~ SOLN
20.0000 [IU] | Freq: Every day | SUBCUTANEOUS | Status: DC
  Administered 2012-05-21 – 2012-05-22 (×2): 20 [IU] via SUBCUTANEOUS

## 2012-05-21 MED ORDER — POTASSIUM CHLORIDE 10 MEQ/50ML IV SOLN
10.0000 meq | INTRAVENOUS | Status: AC
  Administered 2012-05-21 (×3): 10 meq via INTRAVENOUS

## 2012-05-21 MED ORDER — CITALOPRAM HYDROBROMIDE 10 MG PO TABS
10.0000 mg | ORAL_TABLET | Freq: Every day | ORAL | Status: DC
  Administered 2012-05-21 – 2012-05-27 (×7): 10 mg via ORAL
  Filled 2012-05-21 (×7): qty 1

## 2012-05-21 MED ORDER — POTASSIUM CHLORIDE 10 MEQ/50ML IV SOLN
INTRAVENOUS | Status: AC
  Filled 2012-05-21: qty 150

## 2012-05-21 MED ORDER — FUROSEMIDE 10 MG/ML IJ SOLN
20.0000 mg | Freq: Four times a day (QID) | INTRAMUSCULAR | Status: AC
  Administered 2012-05-21 (×3): 20 mg via INTRAVENOUS
  Filled 2012-05-21 (×2): qty 2

## 2012-05-21 MED ORDER — WARFARIN - PHYSICIAN DOSING INPATIENT
Freq: Every day | Status: DC
  Administered 2012-05-22 – 2012-05-23 (×2)

## 2012-05-21 MED ORDER — AMIODARONE HCL 200 MG PO TABS
200.0000 mg | ORAL_TABLET | Freq: Two times a day (BID) | ORAL | Status: DC
  Administered 2012-05-21 – 2012-05-27 (×13): 200 mg via ORAL
  Filled 2012-05-21 (×15): qty 1

## 2012-05-21 MED ORDER — WARFARIN SODIUM 2 MG PO TABS
2.0000 mg | ORAL_TABLET | Freq: Every day | ORAL | Status: DC
  Administered 2012-05-21 – 2012-05-26 (×6): 2 mg via ORAL
  Filled 2012-05-21 (×7): qty 1

## 2012-05-21 MED ORDER — INSULIN ASPART 100 UNIT/ML ~~LOC~~ SOLN: 0.0000 [IU] | SUBCUTANEOUS | Status: DC

## 2012-05-21 MED ORDER — TRAZODONE HCL 50 MG PO TABS
50.0000 mg | ORAL_TABLET | Freq: Every day | ORAL | Status: DC
  Administered 2012-05-21 – 2012-05-26 (×6): 50 mg via ORAL
  Filled 2012-05-21 (×7): qty 1

## 2012-05-21 MED ORDER — LABETALOL HCL 5 MG/ML IV SOLN
10.0000 mg | INTRAVENOUS | Status: DC | PRN
  Filled 2012-05-21: qty 4

## 2012-05-21 MED ORDER — ENSURE COMPLETE PO LIQD
237.0000 mL | Freq: Every day | ORAL | Status: DC
  Administered 2012-05-22: 237 mL via ORAL

## 2012-05-21 MED FILL — Potassium Chloride Inj 2 mEq/ML: INTRAVENOUS | Qty: 40 | Status: AC

## 2012-05-21 MED FILL — Magnesium Sulfate Inj 50%: INTRAMUSCULAR | Qty: 10 | Status: AC

## 2012-05-21 NOTE — Care Management Note (Addendum)
    Page 1 of 2   05/23/2012     3:46:04 PM   CARE MANAGEMENT NOTE 05/23/2012  Patient:  [name] [name], [name] [name]   Account Number:  [id number]  Date Initiated:  05/12/2012  Documentation initiated by:  Junius Creamer  Subjective/Objective Assessment:   adm w at fib     Action/Plan:   lives alone   Anticipated DC Date:  05/27/2012   Anticipated DC Plan:  SKILLED NURSING FACILITY  In-house referral  Clinical Social Worker      DC Planning Services  CM consult      Choice offered to / List presented to:             Status of service:  In process, will continue to follow Medicare Important Message given?   (If response is "NO", the following Medicare IM given date fields will be blank) Date Medicare IM given:   Date Additional Medicare IM given:    Discharge Disposition:    Per UR Regulation:  Reviewed for med. necessity/level of care/duration of stay  If discussed at Long Length of Stay Meetings, dates discussed:   05/13/2012  05/15/2012    Comments:  05-21-12 8am Avie Arenas, RNBSN [phone] Post op MVR and maze.  Talked with daughter Velna Hatchet and son [name].  Verified patient does live at home alone, has been at [facility] for rehab in past and both children state feel this is the best place for pt to go post discharge. Cjhildren have talked with patient and she is in agreement with this plan also.  SW consult placed for SNF.  05/15/12 1024 Pt. was scheduled for MVR today, however overnight developed a hematoma at cath site.  Will postpone surgery for now.  NCM to follow. Tera Mater, RN, BSN [place] [phone]   2/17 1141 debbie dowell rn,bsn

## 2012-05-21 NOTE — Anesthesia Postprocedure Evaluation (Signed)
  Anesthesia Post-op Note  Patient: [name] [name]  Procedure(s) Performed: Procedure(s): MINIMALLY INVASIVE MITRAL VALVE REPAIR (MVR) (Right) MINIMALLY INVASIVE MAZE PROCEDURE (N/A) INTRAOPERATIVE TRANSESOPHAGEAL ECHOCARDIOGRAM (N/A)  Patient Location: SICU  Anesthesia Type:General  Level of Consciousness: awake, alert , oriented and patient cooperative  Airway and Oxygen Therapy: Patient Spontanous Breathing and Patient connected to nasal cannula oxygen  Post-op Pain: mild  Post-op Assessment: Post-op Vital signs reviewed, Patient's Cardiovascular Status Stable, Respiratory Function Stable, Patent Airway, No signs of Nausea or vomiting and Pain level controlled  Post-op Vital Signs: Reviewed and stable  Complications: No apparent anesthesia complications

## 2012-05-21 NOTE — Progress Notes (Signed)
UR Completed.  Vangie Bicker [phone] 05/21/2012

## 2012-05-21 NOTE — Procedures (Signed)
Extubation Procedure Note  Patient Details:   Name: [name] [name] DOB: [birth year] MRN: [id number]   Airway Documentation:  Airway 8 mm (Active)  Secured at (cm) 22 cm 05/21/2012 12:26 AM  Measured From Lips 05/21/2012 12:26 AM  Secured Location Right 05/21/2012 12:26 AM  Secured By Pink Tape 05/21/2012 12:26 AM  Tube Holder Repositioned Yes 05/21/2012 12:26 AM  Site Condition Dry 05/21/2012 12:26 AM    Evaluation  O2 sats: stable throughout Complications: No apparent complications Patient did tolerate procedure well. Bilateral Breath Sounds: Clear Suctioning: Airway Yes  Pt. was extubated to a 4L [place] without any complications, dyspnea or stridor noted. Pt. Achieved a goal of 1.2L on VC & -36 on NIF. Pt. Was instructed on IS X 5. Highest goal achieved was .  Sheryle Hail 05/21/2012, 1:15 AM

## 2012-05-21 NOTE — Progress Notes (Signed)
Patient ID: [name], female   DOB: March 19, 1925, 79 y.o.   MRN: [id number]                   [address]            [place] [address]          [phone]     1 Day Post-Op Procedure(s) (LRB): MINIMALLY INVASIVE MITRAL VALVE REPAIR (MVR) (Right) MINIMALLY INVASIVE MAZE PROCEDURE (N/A) INTRAOPERATIVE TRANSESOPHAGEAL ECHOCARDIOGRAM (N/A)  Total Length of Stay:  LOS: 15 days  BP 96/45  Pulse 70  Temp(Src) 98.6 F (37 C) (Core (Comment))  Resp 17  Ht 5\' 4"  (1.626 m)  Wt 160 lb (72.576 kg)  BMI 27.45 kg/m2  SpO2 100%  .Intake/Output     02/25 0701 - 02/26 0700 02/26 0701 - 02/27 0700   P.O.  390   I.V. (mL/kg) 4407.6 (60.7) 375 (5.2)   Blood 930    NG/GT 30    IV Piggyback 1300 250   Total Intake(mL/kg) 6667.6 (91.9) 1015 (14)   Urine (mL/kg/hr) 5175 (3) 1300 (1.5)   Emesis/NG output 150 (0.1)    Blood 1735 (1)    Chest Tube 590 (0.3) 250 (0.3)   Total Output 7650 1550   Net -982.4 -535          . sodium chloride 20 mL/hr (05/20/12 1556)  . sodium chloride 20 mL/hr (05/20/12 1556)  . dexmedetomidine 0.2 mcg/kg/hr (05/20/12 1800)  . DOPamine 1.5 mcg/kg/min (05/21/12 0800)  . insulin (NOVOLIN-R) infusion 1.2 mL/hr at 05/21/12 0700  . lactated ringers 20 mL/hr (05/20/12 1600)  . milrinone Stopped (05/21/12 1400)  . nitroGLYCERIN Stopped (05/21/12 1600)  . phenylephrine (NEO-SYNEPHRINE) Adult infusion Stopped (05/20/12 1603)     Lab Results  Component Value Date   WBC 14.4* 05/21/2012   HGB 10.9* 05/21/2012   HCT 32.0* 05/21/2012   PLT 120* 05/21/2012   GLUCOSE 113* 05/21/2012   ALT 36* 05/13/2012   AST 18 05/13/2012   NA 141 05/21/2012   K 4.0 05/21/2012   CL 105 05/21/2012   CREATININE 1.00 05/21/2012   BUN 21 05/21/2012   CO2 29 05/21/2012   TSH 1.770 05/13/2012   INR 1.25 05/20/2012   HGBA1C 7.4* 05/13/2012   Up to chair, neuro intact, HCT 32   Delight Ovens MD  Beeper [phone] Office [phone] 05/21/2012 6:54 PM

## 2012-05-21 NOTE — Progress Notes (Signed)
Patient blood sugar greater the 180 per protocol restarted on insulin drip will check CBG's Q1 hour and titrate drip appropriately.   Andres Shad RN

## 2012-05-21 NOTE — Progress Notes (Signed)
NUTRITION FOLLOW UP  Intervention:    Ensure Complete daily (350 kcals, 13 gm protein per 8 fl oz bottle) RD to follow for nutrition care plan  New Nutrition Dx:   Increased nutrient needs related to post-op healing as evidenced by estimated nutrition needs  Goal:   Oral intake with meals & supplements to meet >/= 90% of estimated nutrition needs, currently unmet  Monitor:   PO & supplemental intake, weight, labs, I/O's  Assessment:   Patient s/p procedure 2/25: MINIMALLY INVASIVE MITRAL VALVE REPAIR  MINIMALLY INVASIVE MAZE PROCEDURE  INTRAOPERATIVE TRANSESOPHAGEAL ECHOCARDIOGRAM  Patient resting upon RD visitation.  No % meal intake available.  Appetite fair per comprehensive flowsheet.  Would benefit from addition of nutrition supplement ---> RD to order.  Height: Ht Readings from Last 1 Encounters:  05/20/12 5\' 4"  (1.626 m)    Weight Status:   Wt Readings from Last 1 Encounters:  05/21/12 160 lb (72.576 kg)    Body mass index is 27.45 kg/(m^2).  Re-estimated needs:  Kcal: 1500-1700 Protein: 70-80 gm Fluid: 1.5-1.7 L  Skin: chest & groin surgical incisions  Diet Order: Full Liquid   Intake/Output Summary (Last 24 hours) at 05/21/12 1406 Last data filed at 05/21/12 1400  Gross per 24 hour  Intake 7489.6 ml  Output   7880 ml  Net -390.4 ml    Labs:   Recent Labs Lab 05/19/12 0500 05/20/12 0348  05/20/12 1540 05/20/12 2118 05/20/12 2129 05/21/12 0404  NA 139 139  < > 144  --  142 141  K 3.7 3.8  < > 2.6*  --  3.8 3.3*  CL 107 107  --   --   --  104 104  CO2 24 24  --   --   --   --  29  BUN 33* 32*  --   --   --  22 23  CREATININE 0.96 0.89  --   --  0.78 0.90 0.87  CALCIUM 8.9 8.5  --   --   --   --  8.0*  MG  --   --   --   --  2.8*  --  2.5  GLUCOSE 162* 131*  < > 149*  --  106* 132*  < > = values in this interval not displayed.  CBG (last 3)   Recent Labs  05/20/12 1646 05/20/12 1748 05/20/12 1851  GLUCAP 110* 92 82     Scheduled Meds: . acetaminophen  1,000 mg Oral Q6H   Or  . acetaminophen (TYLENOL) oral liquid 160 mg/5 mL  975 mg Per Tube Q6H  . amiodarone  200 mg Oral BID PC  . aspirin EC  325 mg Oral Daily   Or  . aspirin  324 mg Per Tube Daily  . bisacodyl  10 mg Oral Daily   Or  . bisacodyl  10 mg Rectal Daily  . cefUROXime (ZINACEF)  IV  1.5 g Intravenous Q12H  . citalopram  10 mg Oral Daily  . docusate sodium  200 mg Oral Daily  . furosemide  20 mg Intravenous Q6H  . insulin aspart  0-24 Units Subcutaneous Q4H  . insulin detemir  20 Units Subcutaneous Daily  . insulin regular  0-10 Units Intravenous TID WC  . metoprolol tartrate  12.5 mg Oral BID   Or  . metoprolol tartrate  12.5 mg Per Tube BID  . [START ON 05/22/2012] pantoprazole  40 mg Oral Daily  . sodium chloride  3 mL Intravenous Q12H  . traZODone  50 mg Oral QHS  . warfarin  2 mg Oral q1800  . Warfarin - Physician Dosing Inpatient   Does not apply q1800    Continuous Infusions: . sodium chloride 20 mL/hr (05/20/12 1556)  . sodium chloride 20 mL/hr (05/20/12 1556)  . dexmedetomidine 0.2 mcg/kg/hr (05/20/12 1800)  . DOPamine 1.5 mcg/kg/min (05/21/12 0800)  . insulin (NOVOLIN-R) infusion 1.2 mL/hr at 05/21/12 0700  . lactated ringers 20 mL/hr (05/20/12 1600)  . milrinone Stopped (05/21/12 1400)  . nitroGLYCERIN 30 mcg/min (05/21/12 1400)  . phenylephrine (NEO-SYNEPHRINE) Adult infusion Stopped (05/20/12 1603)    Maureen Chatters, RD, LDN Pager #: [phone] After-Hours Pager #: [phone]

## 2012-05-21 NOTE — Progress Notes (Signed)
   CARDIOTHORACIC SURGERY PROGRESS NOTE   R1 Day Post-Op Procedure(s) (LRB): MINIMALLY INVASIVE MITRAL VALVE REPAIR (MVR) (Right) MINIMALLY INVASIVE MAZE PROCEDURE (N/A) INTRAOPERATIVE TRANSESOPHAGEAL ECHOCARDIOGRAM (N/A)  Subjective: Looks good.  Mild soreness in chest but only w/ coughing.  No SOB.  No nausea.  Objective: Vital signs: BP Readings from Last 1 Encounters:  05/21/12 139/57   Pulse Readings from Last 1 Encounters:  05/21/12 80   Resp Readings from Last 1 Encounters:  05/21/12 20   Temp Readings from Last 1 Encounters:  05/21/12 98.8 F (37.1 C)     Hemodynamics: PAP: (38-72)/(18-33) 57/24 mmHg CO:  [2.1 L/min-4.7 L/min] 4.7 L/min CI:  [1.3 L/min/m2-2.7 L/min/m2] 2.7 L/min/m2  Physical Exam:  Rhythm:   sinus  Breath sounds: clear  Heart sounds:  RRR w/out murmur  Incisions:  Dressings dry  Abdomen:  soft  Extremities:  warm   Intake/Output from previous day: 02/25 0701 - 02/26 0700 In: 4540.9 [I.V.:4407.6; Blood:930; NG/GT:30; IV Piggyback:1300] Out: 7650 [Urine:5175; Emesis/NG output:150; Blood:1735; Chest Tube:590] Intake/Output this shift: Total I/O In: 136.2 [P.O.:30; I.V.:56.2; IV Piggyback:50] Out: 80 [Urine:30; Chest Tube:50]  Lab Results:  Recent Labs  05/20/12 2118 05/20/12 2129 05/21/12 0404  WBC 13.0*  --  13.7*  HGB 11.0* 9.9* 10.8*  HCT 30.8* 29.0* 30.7*  PLT 128*  --  128*   BMET:  Recent Labs  05/20/12 0348  05/20/12 2129 05/21/12 0404  NA 139  < > 142 141  K 3.8  < > 3.8 3.3*  CL 107  --  104 104  CO2 24  --   --  29  GLUCOSE 131*  < > 106* 132*  BUN 32*  --  22 23  CREATININE 0.89  < > 0.90 0.87  CALCIUM 8.5  --   --  8.0*  < > = values in this interval not displayed.  CBG (last 3)   Recent Labs  05/20/12 1646 05/20/12 1748 05/20/12 1851  GLUCAP 110* 92 82   ABG    Component Value Date/Time   PHART 7.468* 05/21/2012 0209   HCO3 29.0* 05/21/2012 0209   TCO2 30 05/21/2012 0209   ACIDBASEDEF 3.0*  05/20/2012 1348   O2SAT 95.0 05/21/2012 0209   CXR: *RADIOLOGY REPORT*  Clinical Data: Postop mitral valve repair  PORTABLE CHEST - 1 VIEW  Comparison: Portable chest x-ray of 05/20/2012  Findings: The endotracheal tube has been removed. There is little  change in aeration with some increase in basilar atelectasis right  greater than left. Two right chest tubes remain and no definite  pneumothorax is seen. Left central venous line and Swan-Ganz  catheter remain.  IMPRESSION:  Endotracheal tube removed. Slight increase in bibasilar linear  atelectasis.  Original Report Authenticated By: Dwyane Dee, M.D.   Assessment/Plan: S/P Procedure(s) (LRB): MINIMALLY INVASIVE MITRAL VALVE REPAIR (MVR) (Right) MINIMALLY INVASIVE MAZE PROCEDURE (N/A) INTRAOPERATIVE TRANSESOPHAGEAL ECHOCARDIOGRAM (N/A)  Doing well POD1 Expected post op acute blood loss anemia, mild, stable Expected post op volume excess, mild, diuresing Hypertension Aortic Insufficiency (1+/2+) Hypokalemia S/P maze procedure for Afib, maintaining NSR so far   Mobilize  Wean drips and d/c swan, d/c old left subclavian central line  Diuresis  Resume amiodarone  Start coumadin slowly  Leave chest tubes in   [facility] H 05/21/2012 8:40 AM

## 2012-05-22 ENCOUNTER — Encounter (HOSPITAL_COMMUNITY): Payer: Self-pay | Admitting: Thoracic Surgery (Cardiothoracic Vascular Surgery)

## 2012-05-22 ENCOUNTER — Inpatient Hospital Stay (HOSPITAL_COMMUNITY): Payer: Medicare Other

## 2012-05-22 LAB — GLUCOSE, CAPILLARY
Glucose-Capillary: 100 mg/dL — ABNORMAL HIGH (ref 70–99)
Glucose-Capillary: 107 mg/dL — ABNORMAL HIGH (ref 70–99)
Glucose-Capillary: 107 mg/dL — ABNORMAL HIGH (ref 70–99)
Glucose-Capillary: 110 mg/dL — ABNORMAL HIGH (ref 70–99)
Glucose-Capillary: 117 mg/dL — ABNORMAL HIGH (ref 70–99)
Glucose-Capillary: 118 mg/dL — ABNORMAL HIGH (ref 70–99)
Glucose-Capillary: 124 mg/dL — ABNORMAL HIGH (ref 70–99)
Glucose-Capillary: 125 mg/dL — ABNORMAL HIGH (ref 70–99)
Glucose-Capillary: 133 mg/dL — ABNORMAL HIGH (ref 70–99)
Glucose-Capillary: 144 mg/dL — ABNORMAL HIGH (ref 70–99)
Glucose-Capillary: 149 mg/dL — ABNORMAL HIGH (ref 70–99)
Glucose-Capillary: 158 mg/dL — ABNORMAL HIGH (ref 70–99)
Glucose-Capillary: 77 mg/dL (ref 70–99)
Glucose-Capillary: 83 mg/dL (ref 70–99)
Glucose-Capillary: 95 mg/dL (ref 70–99)
Glucose-Capillary: 98 mg/dL (ref 70–99)

## 2012-05-22 LAB — TYPE AND SCREEN
ABO/RH(D): A POS
Antibody Screen: NEGATIVE
Unit division: 0
Unit division: 0
Unit division: 0
Unit division: 0
Unit division: 0

## 2012-05-22 LAB — CBC
HCT: 32.1 % — ABNORMAL LOW (ref 36.0–46.0)
Hemoglobin: 11 g/dL — ABNORMAL LOW (ref 12.0–15.0)
MCH: 30.9 pg (ref 26.0–34.0)
MCHC: 34.3 g/dL (ref 30.0–36.0)
MCV: 90.2 fL (ref 78.0–100.0)
RDW: 16.3 % — ABNORMAL HIGH (ref 11.5–15.5)

## 2012-05-22 LAB — PROTIME-INR: INR: 1.38 (ref 0.00–1.49)

## 2012-05-22 LAB — BASIC METABOLIC PANEL
BUN: 25 mg/dL — ABNORMAL HIGH (ref 6–23)
Chloride: 104 mEq/L (ref 96–112)
Creatinine, Ser: 0.99 mg/dL (ref 0.50–1.10)
GFR calc Af Amer: 59 mL/min — ABNORMAL LOW (ref >90)
Glucose, Bld: 104 mg/dL — ABNORMAL HIGH (ref 70–99)
Potassium: 3.6 mEq/L (ref 3.5–5.1)

## 2012-05-22 MED ORDER — INSULIN ASPART 100 UNIT/ML ~~LOC~~ SOLN: 0.0000 [IU] | SUBCUTANEOUS | Status: DC

## 2012-05-22 MED ORDER — FUROSEMIDE 10 MG/ML IJ SOLN
INTRAMUSCULAR | Status: AC
  Filled 2012-05-22: qty 4

## 2012-05-22 MED ORDER — SODIUM CHLORIDE 0.9 % IJ SOLN: 3.0000 mL | Freq: Two times a day (BID) | INTRAMUSCULAR | Status: DC

## 2012-05-22 MED ORDER — POTASSIUM CHLORIDE CRYS ER 20 MEQ PO TBCR
20.0000 meq | EXTENDED_RELEASE_TABLET | Freq: Two times a day (BID) | ORAL | Status: DC
  Administered 2012-05-23 – 2012-05-25 (×6): 20 meq via ORAL
  Filled 2012-05-22 (×8): qty 1

## 2012-05-22 MED ORDER — DEXTROSE 50 % IV SOLN
INTRAVENOUS | Status: AC
  Administered 2012-05-22: 50 mL
  Filled 2012-05-22: qty 50

## 2012-05-22 MED ORDER — AMLODIPINE BESYLATE 5 MG PO TABS
5.0000 mg | ORAL_TABLET | Freq: Every day | ORAL | Status: DC
  Administered 2012-05-22 – 2012-05-27 (×6): 5 mg via ORAL
  Filled 2012-05-22 (×6): qty 1

## 2012-05-22 MED ORDER — ASPIRIN EC 81 MG PO TBEC
81.0000 mg | DELAYED_RELEASE_TABLET | Freq: Every day | ORAL | Status: DC
  Administered 2012-05-22 – 2012-05-27 (×6): 81 mg via ORAL
  Filled 2012-05-22 (×6): qty 1

## 2012-05-22 MED ORDER — MOVING RIGHT ALONG BOOK
Freq: Once | Status: AC
  Administered 2012-05-22: 10:00:00
  Filled 2012-05-22: qty 1

## 2012-05-22 MED ORDER — SODIUM CHLORIDE 0.9 % IJ SOLN: 3.0000 mL | INTRAMUSCULAR | Status: DC | PRN

## 2012-05-22 MED ORDER — SODIUM CHLORIDE 0.9 % IV SOLN: 250.0000 mL | INTRAVENOUS | Status: DC | PRN

## 2012-05-22 MED ORDER — FUROSEMIDE 40 MG PO TABS
40.0000 mg | ORAL_TABLET | Freq: Two times a day (BID) | ORAL | Status: DC
  Administered 2012-05-23 – 2012-05-25 (×6): 40 mg via ORAL
  Filled 2012-05-22 (×9): qty 1

## 2012-05-22 MED ORDER — POTASSIUM CHLORIDE 10 MEQ/50ML IV SOLN
INTRAVENOUS | Status: AC
  Filled 2012-05-22: qty 150

## 2012-05-22 MED ORDER — INSULIN ASPART 100 UNIT/ML ~~LOC~~ SOLN
0.0000 [IU] | Freq: Three times a day (TID) | SUBCUTANEOUS | Status: DC
  Administered 2012-05-22 (×4): 2 [IU] via SUBCUTANEOUS
  Administered 2012-05-23 (×2): 4 [IU] via SUBCUTANEOUS
  Administered 2012-05-24 (×3): 2 [IU] via SUBCUTANEOUS
  Administered 2012-05-25: 22:00:00 via SUBCUTANEOUS
  Administered 2012-05-25 – 2012-05-26 (×5): 2 [IU] via SUBCUTANEOUS

## 2012-05-22 MED ORDER — FUROSEMIDE 10 MG/ML IJ SOLN
20.0000 mg | Freq: Four times a day (QID) | INTRAMUSCULAR | Status: AC
  Administered 2012-05-22 (×3): 20 mg via INTRAVENOUS
  Filled 2012-05-22: qty 2

## 2012-05-22 MED ORDER — POTASSIUM CHLORIDE 10 MEQ/50ML IV SOLN
10.0000 meq | INTRAVENOUS | Status: AC
  Administered 2012-05-22 (×3): 10 meq via INTRAVENOUS

## 2012-05-22 MED ORDER — FUROSEMIDE 10 MG/ML IJ SOLN
INTRAMUSCULAR | Status: AC
  Filled 2012-05-22: qty 2

## 2012-05-22 MED ORDER — DEXTROSE 50 % IV SOLN: 25.0000 mL | Freq: Once | INTRAVENOUS | Status: AC | PRN

## 2012-05-22 NOTE — Clinical Documentation Improvement (Signed)
MALNUTRITION DOCUMENTATION CLARIFICATION  THIS DOCUMENT IS NOT A PERMANENT PART OF THE MEDICAL RECORD  TO RESPOND TO THE THIS QUERY, FOLLOW THE INSTRUCTIONS BELOW:  1. If needed, update documentation for the patient's encounter via the notes activity.  2. Access this query again and click edit on the In [employer].  3. After updating, or not, click F2 to complete all highlighted (required) fields concerning your review. Select "additional documentation in the medical record" OR "no additional documentation provided".  4. Click Sign note button.  5. The deficiency will fall out of your In Basket *Please let us know if you are not able to complete this workflow by phone or e-mail (listed below).  Please update your documentation within the medical record to reflect your response to this query.                                                                                        05/22/12   Dear Dr. Cornel[facility] / Associates,  In a better effort to capture your patient's severity of illness, reflect appropriate length of stay and utilization of resources, a review of the patient medical record has revealed the following indicators.    Based on your clinical judgment, please clarify and document in a progress note and/or discharge summary the clinical condition associated with the following supporting information:  In responding to this query please exercise your independent judgment.  The fact that a query is asked, does not imply that any particular answer is desired or expected.   Possible Clinical Conditions?  __x__Mild Malnutrition   ____Moderate Malnutrition  ____Severe Malnutrition    ____Protein Calorie Malnutrition  ____Severe Protein Calorie Malnutrition  ____Other Condition  ____Cannot clinically determine     Supporting Information: Risk Factors:  Signs & Symptoms: Ht: 5'4"     Wt: 160lb  BMI:  27.45   Treatment: Ensure complete po daily.   Nutrition  Consult: 05/21/12: Increased nutrient needs related to post-op healing as evidenced by estimated nutrition.   You may use possible, probable, or suspect with inpatient documentation. possible, probable, suspected diagnoses MUST be documented at the time of discharge  Reviewed:  no additional documentation provided  Thank You,  Marciano Sequin,  Clinical Documentation Specialist:  Pager: [phone]  Phone: [phone]  Health Information Management [place]

## 2012-05-22 NOTE — Progress Notes (Cosign Needed)
Clinical Social Work Department BRIEF PSYCHOSOCIAL ASSESSMENT 05/22/2012  Patient:  [name] [name], [name] [name]     Account Number:  [id number]     Admit date:  05/06/2012  Clinical Social Worker:  Madaline Guthrie  Date/Time:  05/22/2012 03:30 PM  Referred by:  Care Management  Date Referred:  05/22/2012 Referred for  SNF Placement   Other Referral:   Interview type:  Patient Other interview type:    PSYCHOSOCIAL DATA Living Status:  ALONE Admitted from facility:   Level of care:   Primary support name:  Meyah Corle Primary support relationship to patient:  CHILD, ADULT Degree of support available:   Lives close by and is willing to help patient once discharged. Pts daughter also live about 30 min from pt home and can help.    Corby Villasenor- [phone]    CURRENT CONCERNS Current Concerns  None Noted   Other Concerns:    SOCIAL WORK ASSESSMENT / PLAN CSW intern met with pt who was performing ambulations with nurse and CNA before entering the room. Pt lived at home prior to hospital admission and was totally independent. She feels that she may not be comfortable returning home after SNF if she did not make drastic improvements. Pts son Shinika Estelle is her primary support and helps her whenever necessary. Pts daughter also live 30 minutes from her home in [place] and will help her with recovery. CSW intern explained the SNF process and pt was understanding. Pt and family want to go to [facility] in [place]. CSW intern will notify patient of bed offers.   Assessment/plan status:  Other - See comment Other assessment/ plan:   SNF placement   Information/referral to community resources:    PATIENTS/FAMILYS RESPONSE TO PLAN OF CARE: Pt is agreeing and wants to go to SNF. Preferably [facility] in [place].

## 2012-05-22 NOTE — Progress Notes (Signed)
CARDIAC REHAB PHASE I   PRE:  Rate/Rhythm: 70 paced    BP: sitting 128/47    SaO2: 95 RA  MODE:  Ambulation: 340 ft   POST:  Rate/Rhythm: 70 pacing    BP: sitting 157/73     SaO2: 95 RA  Ambulated assist x2 with RW. Tolerated well with steady gait. Trouble turning RW supposedly due to pain and/or weakness. On return to room pt to bed and c/o severe pain under right breast. Notified RN for pain meds. Will f/u. [phone]  Harriet Masson CES, ACSM

## 2012-05-22 NOTE — Progress Notes (Signed)
Hypoglycemic Event  CBG: 62  Treatment: 25ml D50  Symptoms: none  Follow-up CBG: Time: 0210 CBG Result: 124  Possible Reasons for Event: insulin drip   Comments/MD notified: protocol followed    Gaetano Hawthorne RN  Remember to initiate Hypoglycemia Order Set & complete

## 2012-05-22 NOTE — Progress Notes (Signed)
   CARDIOTHORACIC SURGERY PROGRESS NOTE   R2 Days Post-Op Procedure(s) (LRB): MINIMALLY INVASIVE MITRAL VALVE REPAIR (MVR) (Right) MINIMALLY INVASIVE MAZE PROCEDURE (N/A) INTRAOPERATIVE TRANSESOPHAGEAL ECHOCARDIOGRAM (N/A)  Subjective: Looks good.  Reportedly slept fairly well last night.  Mild soreness right chest and arm.  No SOB. Marginal appetite.  Objective: Vital signs: BP Readings from Last 1 Encounters:  05/22/12 131/54   Pulse Readings from Last 1 Encounters:  05/22/12 70   Resp Readings from Last 1 Encounters:  05/22/12 16   Temp Readings from Last 1 Encounters:  05/22/12 97.5 F (36.4 C) Oral    Hemodynamics: PAP: (43-58)/(15-24) 43/15 mmHg CO:  [3.4 L/min-4.7 L/min] 3.5 L/min CI:  [2 L/min/m2-2.7 L/min/m2] 2.1 L/min/m2  Physical Exam:  Rhythm:   Sinus brady - AAI paced  Breath sounds: Diminished at bases, o/w fairly clear  Heart sounds:  RRR w/out murmur  Incisions:  Clean and dry  Abdomen:  Soft, non-distended  Extremities:  Warm, well perfused   Intake/Output from previous day: 02/26 0701 - 02/27 0700 In: 1393.7 [P.O.:390; I.V.:603.7; IV Piggyback:400] Out: 2800 [Urine:2280; Chest Tube:520] Intake/Output this shift:    Lab Results:  Recent Labs  05/21/12 1600 05/21/12 1619 05/22/12 0350  WBC 14.4*  --  11.8*  HGB 11.5* 10.9* 11.0*  HCT 32.8* 32.0* 32.1*  PLT 120*  --  107*   BMET:  Recent Labs  05/21/12 0404  05/21/12 1619 05/22/12 0350  NA 141  --  141 141  K 3.3*  --  4.0 3.6  CL 104  --  105 104  CO2 29  --   --  32  GLUCOSE 132*  --  113* 104*  BUN 23  --  21 25*  CREATININE 0.87  < > 1.00 0.99  CALCIUM 8.0*  --   --  8.8  < > = values in this interval not displayed.  CBG (last 3)   Recent Labs  05/21/12 2216 05/21/12 2348 05/22/12 0342  GLUCAP 126* 98 95   ABG    Component Value Date/Time   PHART 7.468* 05/21/2012 0209   HCO3 29.0* 05/21/2012 0209   TCO2 28 05/21/2012 1619   ACIDBASEDEF 3.0* 05/20/2012 1348   O2SAT 95.0 05/21/2012 0209   CXR: Mild bibasilar atelectasis, R>L  Assessment/Plan: S/P Procedure(s) (LRB): MINIMALLY INVASIVE MITRAL VALVE REPAIR (MVR) (Right) MINIMALLY INVASIVE MAZE PROCEDURE (N/A) INTRAOPERATIVE TRANSESOPHAGEAL ECHOCARDIOGRAM (N/A)  Doing well POD2 S/P maze - maintaining NSR so far although HR decreased Expected post op acute blood loss anemia, mild, stable Expected post op volume excess, mild, diuresing fairly well Hypokalemia, mild, diuretic induced Post op thrombocytopenia, mild   Mobilize  Diuresis  D/C lopressor and continue amiodarone, AAI pacing for now  Coumadin  Leave chest tubes in until output decreased  Transfer step down    Kayhan Boardley H 05/22/2012 7:21 AM

## 2012-05-23 ENCOUNTER — Inpatient Hospital Stay (HOSPITAL_COMMUNITY): Payer: Medicare Other

## 2012-05-23 LAB — CBC
HCT: 36.3 % (ref 36.0–46.0)
Hemoglobin: 12.4 g/dL (ref 12.0–15.0)
RBC: 3.96 MIL/uL (ref 3.87–5.11)

## 2012-05-23 LAB — GLUCOSE, CAPILLARY
Glucose-Capillary: 62 mg/dL — ABNORMAL LOW (ref 70–99)
Glucose-Capillary: 71 mg/dL (ref 70–99)
Glucose-Capillary: 79 mg/dL (ref 70–99)
Glucose-Capillary: 98 mg/dL (ref 70–99)

## 2012-05-23 LAB — BASIC METABOLIC PANEL
BUN: 36 mg/dL — ABNORMAL HIGH (ref 6–23)
CO2: 28 mEq/L (ref 19–32)
Chloride: 100 mEq/L (ref 96–112)
GFR calc non Af Amer: 51 mL/min — ABNORMAL LOW (ref >90)
Glucose, Bld: 84 mg/dL (ref 70–99)
Potassium: 3.7 mEq/L (ref 3.5–5.1)
Sodium: 138 mEq/L (ref 135–145)

## 2012-05-23 LAB — PROTIME-INR
INR: 1.56 — ABNORMAL HIGH (ref 0.00–1.49)
Prothrombin Time: 18.2 seconds — ABNORMAL HIGH (ref 11.6–15.2)

## 2012-05-23 MED ORDER — ASPIRIN 81 MG PO TBEC: 81.0000 mg | DELAYED_RELEASE_TABLET | Freq: Every day | ORAL | Status: DC

## 2012-05-23 MED ORDER — ENSURE COMPLETE PO LIQD
237.0000 mL | Freq: Two times a day (BID) | ORAL | Status: DC
  Administered 2012-05-24 – 2012-05-27 (×6): 237 mL via ORAL

## 2012-05-23 MED ORDER — POTASSIUM CHLORIDE CRYS ER 20 MEQ PO TBCR: 20.0000 meq | EXTENDED_RELEASE_TABLET | Freq: Every day | ORAL | Status: DC

## 2012-05-23 MED ORDER — METFORMIN HCL 500 MG PO TABS
500.0000 mg | ORAL_TABLET | Freq: Two times a day (BID) | ORAL | Status: DC
  Administered 2012-05-23 – 2012-05-26 (×6): 500 mg via ORAL
  Filled 2012-05-23 (×8): qty 1

## 2012-05-23 MED ORDER — TRAMADOL HCL 50 MG PO TABS: 50.0000 mg | ORAL_TABLET | ORAL | Status: DC | PRN

## 2012-05-23 MED ORDER — FUROSEMIDE 40 MG PO TABS: 40.0000 mg | ORAL_TABLET | Freq: Every day | ORAL | Status: DC

## 2012-05-23 MED ORDER — GLIMEPIRIDE 1 MG PO TABS
1.0000 mg | ORAL_TABLET | Freq: Every day | ORAL | Status: DC
  Administered 2012-05-24 – 2012-05-26 (×3): 1 mg via ORAL
  Filled 2012-05-23 (×4): qty 1

## 2012-05-23 MED ORDER — AMIODARONE HCL 200 MG PO TABS: 200.0000 mg | ORAL_TABLET | Freq: Two times a day (BID) | ORAL | Status: DC

## 2012-05-23 NOTE — Progress Notes (Signed)
Hypoglycemic Event  CBG: 62  Treatment: 15 GM carbohydrate snack  Symptoms: None  Follow-up CBG: [id number] CBG Result:71  Possible Reasons for Event: Unknown  Comments/MD notified: no    [name] [name]  Remember to initiate Hypoglycemia Order Set & complete

## 2012-05-23 NOTE — Clinical Social Work Note (Signed)
CSW continues to follow up to assist with dc planning. Pt would like to dc to [facility] in [place] once medically cleared. SNF will not make a bed offer until Pt is evaluated by PT/OT. CSW will f/u with Pt and her family on Monday with possible SNF bed offer.   Frederico Hamman, LCSW [phone]

## 2012-05-23 NOTE — Progress Notes (Signed)
Agree with Dietetic Intern note.  Joaquin Courts, RD, LDN, CNSC Pager# [phone] After Hours Pager# [phone]

## 2012-05-23 NOTE — Progress Notes (Signed)
S: Called by patient's nurse due to patient complaining of right knee pain.  States patient was able to ambulate yesterday without difficulty.  This morning, she is unable to bear weight on her right leg due to a "burning" in her right knee.  Patient has a history of arthritis but states that is in her shoulder  O:  Right Knee: appears mildly swollen, skin is not warm to touch, full ROM Pulses: + dorsalis pedal, popliteal pulse Right Groin: incision looks good, no evidence of hematoma present   A/P:  1. Right Knee Pain- I feel this is most likely arthritic changes.  I encouraged patient to continue to ambulate with assistance.  Will obtain film of right knee

## 2012-05-23 NOTE — Progress Notes (Signed)
CARDIAC REHAB PHASE I   PRE:  Rate/Rhythm: 66    BP: sitting 118/45    SaO2: 96 RA  Came to ambulate but pt unable to stand, even with gait belt assist. Sts right knee burns when she puts pressure on it. This started this am when she got up to bathe. Staff says she needed max x2 assist to stand and stay standing. Knee was buckling as they supported her. Will hold ambulation for now.  [id number]  Harriet Masson CES, ACSM

## 2012-05-23 NOTE — Progress Notes (Signed)
NUTRITION FOLLOW UP  Intervention:    Ensure Complete BID (350 kcals, 13 gm protein per 8 fl oz bottle) RD to follow for nutrition care plan  New Nutrition Dx:   Increased nutrient needs related to post-op healing as evidenced by estimated nutrition needs  Goal:   Oral intake with meals & supplements to meet >/= 90% of estimated nutrition needs, currently unmet  Monitor:   PO & supplemental intake, weight, labs, I/O's  Assessment:   Patient s/p procedure 2/25: Minimally invasive mitral valve repair  Minimally invasive MAZE procedure Intraoperative Transesophageal Echocardiogram    Pt currently receiving Ensure Complete once a day. Dietetic Intern spoke with pt and family regarding appetite. Pt reports decreased appetite since her procedure.  Per documentation, pt only consumed 25% of her breakfast. Pt feels she is not eating enough to meet her nutrition needs and is worried about maintaining her blood sugar.  Pt had hypoglycemic event early this morning. Pt finds Ensure supplement helpful and would like to receive it BID.     Height: Ht Readings from Last 1 Encounters:  05/20/12 5\' 4"  (1.626 m)    Weight Status:   Wt Readings from Last 1 Encounters:  05/23/12 159 lb 6.3 oz (72.3 kg)    Body mass index is 27.35 kg/(m^2). Overweight    Re-estimated needs:  Kcal: 1500-1700 Protein: 70-80 gm Fluid: 1.5-1.7 L  Skin: chest & groin surgical incisions  Diet Order: Carb Control   Intake/Output Summary (Last 24 hours) at 05/23/12 1207 Last data filed at 05/23/12 0730  Gross per 24 hour  Intake   1080 ml  Output    620 ml  Net    460 ml    Last BM: 05/22/2012  Labs:   Recent Labs Lab 05/20/12 2118  05/21/12 0404 05/21/12 1600 05/21/12 1619 05/22/12 0350 05/23/12 0635  NA  --   < > 141  --  141 141 138  K  --   < > 3.3*  --  4.0 3.6 3.7  CL  --   < > 104  --  105 104 100  CO2  --   --  29  --   --  32 28  BUN  --   < > 23  --  21 25* 36*  CREATININE 0.78  <  > 0.87 1.01 1.00 0.99 0.98  CALCIUM  --   --  8.0*  --   --  8.8 9.0  MG 2.8*  --  2.5 2.3  --   --   --   GLUCOSE  --   < > 132*  --  113* 104* 84  < > = values in this interval not displayed.  CBG (last 3)   Recent Labs  05/23/12 0558 05/23/12 0624 05/23/12 1120  GLUCAP 62* 71 181*    Scheduled Meds: . acetaminophen  1,000 mg Oral Q6H  . amiodarone  200 mg Oral BID PC  . amLODipine  5 mg Oral Daily  . aspirin EC  81 mg Oral Daily  . bisacodyl  10 mg Oral Daily   Or  . bisacodyl  10 mg Rectal Daily  . citalopram  10 mg Oral Daily  . docusate sodium  200 mg Oral Daily  . feeding supplement  237 mL Oral Q1500  . furosemide  40 mg Oral BID  . [START ON 05/24/2012] glimepiride  1 mg Oral Q breakfast  . insulin aspart  0-24 Units Subcutaneous TID AC & HS  .  metFORMIN  500 mg Oral BID WC  . pantoprazole  40 mg Oral Daily  . potassium chloride  20 mEq Oral BID  . sodium chloride  3 mL Intravenous Q12H  . traZODone  50 mg Oral QHS  . warfarin  2 mg Oral q1800  . Warfarin - Physician Dosing Inpatient   Does not apply q1800    Continuous Infusions: . sodium chloride 20 mL/hr (05/20/12 1556)    Belenda Cruise  Dietetic Intern Pager: [phone]

## 2012-05-23 NOTE — Progress Notes (Signed)
Ambulation attempted; pt ambulated out of room a few feet; both knees began to buckle; pt assisted quickly to chair to sit; pt back to room; call bell w/i reach; will attempt ambulation at a later time.

## 2012-05-23 NOTE — Progress Notes (Addendum)
3 Days Post-Op Procedure(s) (LRB): MINIMALLY INVASIVE MITRAL VALVE REPAIR (MVR) (Right) MINIMALLY INVASIVE MAZE PROCEDURE (N/A) INTRAOPERATIVE TRANSESOPHAGEAL ECHOCARDIOGRAM (N/A) Subjective:  [name] [name] has no new complaints this morning.  She states she has some very mild soreness at her chest tube site which is well controlled with Tramadol.   Objective: Vital signs in last 24 hours: Temp:  [97.8 F (36.6 C)-98.3 F (36.8 C)] 97.8 F (36.6 C) (02/28 0510) Pulse Rate:  [70-86] 86 (02/28 0510) Cardiac Rhythm:  [-] Atrial paced (02/27 2029) Resp:  [20-25] 20 (02/28 0510) BP: (99-154)/(44-88) 154/52 mmHg (02/28 0510) SpO2:  [93 %-100 %] 93 % (02/28 0510) Weight:  [159 lb 6.3 oz (72.3 kg)] 159 lb 6.3 oz (72.3 kg) (02/28 0510)  Intake/Output from previous day: 02/27 0701 - 02/28 0700 In: 1060 [P.O.:990; I.V.:20; IV Piggyback:50] Out: 840 [Urine:640; Chest Tube:200]  General appearance: alert, cooperative and no distress Heart: regular rate and rhythm Lungs: clear to auscultation bilaterally Abdomen: soft, non-tender; bowel sounds normal; no masses,  no organomegaly Extremities: edema trace Wound: clean and dry  Lab Results:  Recent Labs  05/22/12 0350 05/23/12 0635  WBC 11.8* 12.0*  HGB 11.0* 12.4  HCT 32.1* 36.3  PLT 107* 122*   BMET:  Recent Labs  05/21/12 0404  05/21/12 1619 05/22/12 0350  NA 141  --  141 141  K 3.3*  --  4.0 3.6  CL 104  --  105 104  CO2 29  --   --  32  GLUCOSE 132*  --  113* 104*  BUN 23  --  21 25*  CREATININE 0.87  < > 1.00 0.99  CALCIUM 8.0*  --   --  8.8  < > = values in this interval not displayed.  PT/INR:  Recent Labs  05/23/12 0635  LABPROT 18.2*  INR 1.56*   ABG    Component Value Date/Time   PHART 7.468* 05/21/2012 0209   HCO3 29.0* 05/21/2012 0209   TCO2 28 05/21/2012 1619   ACIDBASEDEF 3.0* 05/20/2012 1348   O2SAT 95.0 05/21/2012 0209   CBG (last 3)   Recent Labs  05/22/12 2142 05/23/12 0558 05/23/12 0624   GLUCAP 107* 62* 71    Assessment/Plan: S/P Procedure(s) (LRB): MINIMALLY INVASIVE MITRAL VALVE REPAIR (MVR) (Right) MINIMALLY INVASIVE MAZE PROCEDURE (N/A) INTRAOPERATIVE TRANSESOPHAGEAL ECHOCARDIOGRAM (N/A)  1. CV- Currently NSR, AAI paced, rate in the 80s- will continue Amiodarone, hold Lopressor for now, can likely d/c pacing 2. Pulm- no acute issues, off oxygen, no CXR this morning 3. Chest tubes- 200 cc output, will leave one more day, get CXR in AM  4. INR-1.56- continue 2mg  daily 5. DM- well controlled at home, hypoglycemic this morning, will continue current regimen 6. Deconditioning- patient lives alone, will need SNF at discharge 7. Dispo- patient making progress, will leave chest tubes one more day, maybe SNF on Monday   LOS: 17 days    [name], [name] 05/23/2012  I have seen and examined the patient and agree with the assessment and plan as outlined.  Doing very well.  Maintaining NSR w/ pacer off.  Likely d/c pacing wires and chest tubes tomorrow.  Decrease insulin and restart oral agents for DMII.  Anticipate possible d/c to SNF by Monday.    [name] [name] H 05/23/2012 9:32 AM

## 2012-05-23 NOTE — Progress Notes (Signed)
Pt has complained off and on of R knee pain and burning in R knee; pt unable now to put weight on R leg and support weight to stand or ambulate; PA paged to make aware; will await callback.

## 2012-05-23 NOTE — Discharge Summary (Addendum)
Physician Discharge Summary  Patient ID: [name] [name] MRN: [id number] DOB/AGE: 77-Oct-1926 77 y.o.  Admit date: 05/06/2012 Discharge date: 05/27/2012  Admission Diagnoses:  Patient Active Problem List  Diagnosis  . Cardiac arrest  . Shock  . Encephalopathy acute  . Hyperglycemia  . Diabetes type 2, uncontrolled  . Acute respiratory failure  . Cardiogenic shock  . Acute diastolic heart failure  . Mitral regurgitation  . Atrial fibrillation  . Pleural effusion  . Hematoma   Discharge Diagnoses:    Patient Active Problem List  Diagnosis  . Cardiac arrest  . Shock  . Encephalopathy acute  . Hyperglycemia  . Diabetes type 2, uncontrolled  . Acute respiratory failure  . Cardiogenic shock  . Acute diastolic heart failure  . Mitral regurgitation  . Atrial fibrillation  . Pleural effusion  . Hematoma  . S/P mitral valve repair  . S/P Maze operation for atrial fibrillation   Discharged Condition: good  History of Present Illness:   [name] [name] is a 79 yo white female with known history of Hypertension, Type II Diabetes, Hyperlipidemia, and Non-Obstructed CAD.  Approximately 2 years ago the patient suffered a syncopal episode, at which time she suffered a closed head injury requiring hospitalization and subsequent discharge to SNF.  The patient has done well since then and remains functionally independent.  Over the past several weeks the patient noticed an acute onset of shortness of breath.  This was associated with some dizziness and palpitations.  Cough was present, but was non-productive.  She presented to her PCP for evaluation at which time she was found to be in Atrial Fibrillation and was subsequently sent to he Emergency Department at [facility] for further admission.  Workup revealed the patient to be in mild pulmonary edema and bilateral pleural effusions.  She underwent further workup with CTA of chest with ruled out pulmonary embolus but re-demonstrated effusions  and edema.  Finally in the ED she was treated with Cardizem and Erythromycin.  However, she developed agonal breathing and respiratory distress.  The patient did require brief CPR was intubated and transferred to [facility] for further evaluation.    Hospital Course:   Upon arrival patient was admitted to ICU by Cardiology service.  She was in rapid Atrial Fibrillation on arrival and was treated with anti-arrhythmics.  She was in Class IV CHF and treated aggressively with IV diuresis.  She was empirically treated for pneumonia however she did not show clinical signs of an infectious process being present.  The patient recovered quickly with diuresis and was extubated without difficulty.  Further workup consisted on an Echocardiogram which confirmed the presence of severe mitral regurgitation with a preserved EF and cardiac catheterization was performed and did not reveal significant coronary disease.  It was felt the patient would benefit from intervention to her Mitral Valve.  TCTS was consulted and the patient was evaluated by Dr. Cornelius Moras who felt the patient would be a candidate for a Mitral Valve Repair and Maze Procedure.  The risks and benefits of the procedure were explained to the patient and her family and she was agreeable to proceed.  The patient was originally scheduled to have surgery on Thursday 05/08/2012 but she developed a large hematoma in her left groin.  Venous study was obtained that confirmed the presence of a hematoma which appeared to be a resolved pseudoaneurysm.  Therefore he surgery was rescheduled for 05/20/2012.  Her hemoglobin had decreased prompting transfusion prior to surgery.  The patient  was taken to the operating room on 05/20/2012.  She underwent Minimally Invasive Mitral Valve Repair utilizing a 26 mm Sorin Memo 3D ring for annuloplasty and a MAZE Procedure with complete bi atrial lesion set using cryothermy.  The patient tolerated the procedure well and was taken to the SICU in  stable condition.   The patient was extubated the evening of surgery.  During her stay in the ICU she was weaned off cardiac drips.  Her arterial lines were removed.  She had some issues with Bradycardia prompting discontinuation of her Beta Blocker.  She required AAI pacing and remained on Amiodarone for Atrial Fibrillation prophylaxis.  Once medically stable she was transferred to the step down unit.  The patient is doing well.  She is currently maintaining NSR and her temporary pacing has been discontinued.  Her pacing wires have been removed.  She is currently on Coumadin with a goal INR range of 2.0-3.0.  However her INR today 05/27/2012 is 2.91 we will hold her evening dose of Coumadin.  She will need to restart her coumadin tomorrow 05/28/2012 at 1mg  daily.  She will require SNF at placement.  Patient is medically stable at this time and we will discharge to SNF today.  Consults: cardiology  Significant Diagnostic Studies:   Angiography:   Hemodynamic Findings:  Ao: 128/63  LV: 123/7/9  RA: 5  RV: 36/4/9  PA: 32/16 (mean 24)  PCWP: 11  Fick Cardiac Output: 2.96 L/min  Fick Cardiac Index: 1.75 L/min/m2  Central Aortic Saturation: 99%  Pulmonary Artery Saturation: 58%  Angiographic Findings:  Left main: No obstructive disease noted.  Left Anterior Descending Artery: Large caliber vessel that courses to the apex. There is a moderate sized diagonal branch. There is mild plaque in the mid and distal LAD.  Circumflex Artery: Large caliber vessel with three small caliber obtuse marginal branches. There is mild plaque in the proximal circumflex. There is mild plaque in the third obtuse marginal branch.  Right Coronary Artery: Large, dominant vessel with 20% proximal stenosis, mild mid plaque. The PDA is moderate sized with no obstructive disease. The Posterolateral branch has mild plaque disease.  Left Ventricular Angiogram: LVEF=65%. Moderate to severe MR by LVgram.  Distal aortogram: Mild  plaque. No aneurysm noted. Mild plaque noted in iliac system.   ECHOCARDIOGRAPHY:  LV EF: 65% - 70%  ------------------------------------------------------------ Study Conclusions  - Left ventricle: The cavity size was normal. Wall thickness was normal. Systolic function was vigorous. The estimated ejection fraction was in the range of 65% to 70%. - Aortic valve: Normal-sized, mildly calcified annulus. Trileaflet; normal thickness, noncalcified leaflets. Mild regurgitation originating from the central coaptation point and directed along the septum. - Mitral valve: Mildly dilated annulus. Mildly thickened leaflets . There was malcoaptation of the valve leaflets. Severe regurgitation originating from the central commissure and directed centrally. - Left atrium: No evidence of thrombus in the atrial cavity or appendage. - Staged echo: No change post bypass in aortic valve function. 1+ central aortic insufficiency remains. The mitral valve annular ring was in good position. Post repair images demonstrated no residual MR, no MS and no perivalvular leak. Impressions:  - Mitral valve annular ring in good position. Mitral regurgitation is no longer present. No other change from pre-bypass images. Intraoperative transesophageal echocardiography. Patient status: Inpatient. Location: Operating room.   Treatments: surgery:   Minimally-Invasive Mitral Valve Repair Sorin Memo 3D Ring Annuloplasty (size 26mm, catalog [id number], serial [id number])  Maze Procedure Complete biatrial lesion set using  cryothermy   Disposition: SNF  The patient has been discharged on:   1.Beta Blocker:  Yes [   ]                              No   [x   ]                              If No, reason:Bradycardia, No CAD  2.Ace Inhibitor/ARB: Yes [   ]                                     No  [  x  ]                                     If No, reason: Preserved EF, No CAD, Labile blood pressure  3.Statin:   Yes  [  x ]                  No  [   ]                  If No, reason:  4.Ecasa:  Yes  [ x  ]                  No   [   ]                  If No, reason:          Future Appointments Provider Department Dept Phone   06/09/2012 4:30 PM Purcell Nails, MD [facility] [phone]       Medication List    STOP taking these medications       nadolol 40 MG tablet  Commonly known as:  CORGARD     spironolactone-hydrochlorothiazide 25-25 MG per tablet  Commonly known as:  ALDACTAZIDE      TAKE these medications       amiodarone 200 MG tablet  Commonly known as:  PACERONE  Take 1 tablet (200 mg total) by mouth 2 (two) times daily after a meal.     amLODipine 5 MG tablet  Commonly known as:  NORVASC  Take 5 mg by mouth daily.     aspirin 81 MG EC tablet  Take 1 tablet (81 mg total) by mouth daily.     citalopram 10 MG tablet  Commonly known as:  CELEXA  Take 10 mg by mouth daily.     furosemide 40 MG tablet  Commonly known as:  LASIX  Take 1 tablet (40 mg total) by mouth daily. For 7 Days     glimepiride 1 MG tablet  Commonly known as:  AMARYL  Take 1 mg by mouth daily before breakfast.     insulin glargine 100 UNIT/ML injection  Commonly known as:  LANTUS  Inject 18 Units into the skin at bedtime.     metFORMIN 500 MG tablet  Commonly known as:  GLUCOPHAGE  Take 500 mg by mouth 2 (two) times daily with a meal.     potassium chloride SA 20 MEQ tablet  Commonly known as:  K-DUR,KLOR-CON  Take 1 tablet (20 mEq total) by mouth daily.  For 7 Days     pravastatin 40 MG tablet  Commonly known as:  PRAVACHOL  Take 40 mg by mouth daily.     rOPINIRole 0.5 MG tablet  Commonly known as:  REQUIP  Take 0.5 mg by mouth at bedtime.     traMADol 50 MG tablet  Commonly known as:  ULTRAM  Take 1-2 tablets (50-100 mg total) by mouth every 4 (four) hours as needed.     traZODone 50 MG tablet  Commonly known as:  DESYREL  Take 50  mg by mouth at bedtime.     warfarin 1 MG tablet  Commonly known as:  COUMADIN  Take 1 tablet (1 mg total) by mouth daily. Or as directed by Coumadin Clinic       Follow-up Information   Follow up with Purcell Nails, MD On 06/09/2012. (Have a chest x-ray at [facility] at 3:30, then see MD at 4:30)    Contact information:   [address] [place] [address] [phone]       Follow up with PT/INR. ([facility] will need to draw INR and send results to [facility] in [place].  Please keep INR 2.0-3.0)       Follow up with Arvilla Meres, MD. Schedule an appointment as soon as possible for a visit in 2 weeks.   Contact information:   [address] [place] [address] [phone]      05/27/2012, 8:07 AM

## 2012-05-23 NOTE — Progress Notes (Signed)
PA returned page at this time; no new orders; PA to come and see pt shortly; will cont. To monitor; pt in chair at this time an unable to work with cardiac rehab.

## 2012-05-24 ENCOUNTER — Inpatient Hospital Stay (HOSPITAL_COMMUNITY): Payer: Medicare Other

## 2012-05-24 LAB — GLUCOSE, CAPILLARY
Glucose-Capillary: 212 mg/dL — ABNORMAL HIGH (ref 70–99)
Glucose-Capillary: 158 mg/dL — ABNORMAL HIGH (ref 70–99)

## 2012-05-24 LAB — PROTIME-INR: INR: 1.68 — ABNORMAL HIGH (ref 0.00–1.49)

## 2012-05-24 NOTE — Progress Notes (Signed)
EPW D/C'd, pt tolerated. VSS, will monitor closely.

## 2012-05-24 NOTE — Progress Notes (Signed)
Pt ambulated from bed to [facility] x's 3, tolerated well. Pt back in bed resting, will continue to monitor.

## 2012-05-24 NOTE — Progress Notes (Addendum)
                    [address]            [place] [address]          [phone]     4 Days Post-Op Procedure(s) (LRB): MINIMALLY INVASIVE MITRAL VALVE REPAIR (MVR) (Right) MINIMALLY INVASIVE MAZE PROCEDURE (N/A) INTRAOPERATIVE TRANSESOPHAGEAL ECHOCARDIOGRAM (N/A)  Subjective: Had some right knee pain and weakness yesterday, but feeling better today.  No complaints.   Objective: Vital signs in last 24 hours: Patient Vitals for the past 24 hrs:  BP Temp Temp src Pulse Resp SpO2 Weight  05/24/12 0353 101/64 mmHg 98.9 F (37.2 C) Oral 71 20 91 % 161 lb 3.2 oz (73.12 kg)  05/23/12 1956 116/39 mmHg 98.5 F (36.9 C) Oral 76 18 91 % -  05/23/12 1501 114/49 mmHg 98.8 F (37.1 C) Oral 68 22 96 % -  05/23/12 1013 165/80 mmHg - - 80 - - -   Current Weight  05/24/12 161 lb 3.2 oz (73.12 kg)     Intake/Output from previous day: 02/28 0701 - 03/01 0700 In: 1200 [P.O.:1200] Out: 1492 [Urine:1400; Stool:2; Chest Tube:90]  CBGs 91-478-295    PHYSICAL EXAM:  Heart: RRR Lungs: Clear Wound: Clean and dry Extremities: Trace LE edema, resolving ecchymosis R groin    Lab Results: CBC: Recent Labs  05/22/12 0350 05/23/12 0635  WBC 11.8* 12.0*  HGB 11.0* 12.4  HCT 32.1* 36.3  PLT 107* 122*   BMET:  Recent Labs  05/22/12 0350 05/23/12 0635  NA 141 138  K 3.6 3.7  CL 104 100  CO2 32 28  GLUCOSE 104* 84  BUN 25* 36*  CREATININE 0.99 0.98  CALCIUM 8.8 9.0    PT/INR:  Recent Labs  05/24/12 0445  LABPROT 19.2*  INR 1.68*      Assessment/Plan: S/P Procedure(s) (LRB): MINIMALLY INVASIVE MITRAL VALVE REPAIR (MVR) (Right) MINIMALLY INVASIVE MAZE PROCEDURE (N/A) INTRAOPERATIVE TRANSESOPHAGEAL ECHOCARDIOGRAM (N/A) CV- stable, SR.  Continue Amio, Norvasc, Labetalol, Coumadin.  Vol overload- diurese. DM- back on home meds.  CT output continues to decrease.  Hopefully can d/c soon. No CXR this am- will order. Continue CRPI,  PT/reconditioning. Disp- SNF next week when a bed is available.     LOS: 18 days    COLLINS,GINA H 05/24/2012  I have seen and examined the patient and agree with the assessment and plan as outlined.  Looks very good.  Likely ready for d/c to SNF by Monday.  D/C wires and tubes today.  Kanya Potteiger H 05/24/2012 11:54 AM

## 2012-05-24 NOTE — Progress Notes (Signed)
CT pulled, pt tolerated well, VSS. Will continue to monitor.

## 2012-05-25 ENCOUNTER — Inpatient Hospital Stay (HOSPITAL_COMMUNITY): Payer: Medicare Other

## 2012-05-25 LAB — BASIC METABOLIC PANEL
BUN: 38 mg/dL — ABNORMAL HIGH (ref 6–23)
CO2: 30 mEq/L (ref 19–32)
GFR calc non Af Amer: 47 mL/min — ABNORMAL LOW (ref >90)
Glucose, Bld: 124 mg/dL — ABNORMAL HIGH (ref 70–99)
Potassium: 4 mEq/L (ref 3.5–5.1)

## 2012-05-25 LAB — CBC
HCT: 35.3 % — ABNORMAL LOW (ref 36.0–46.0)
Hemoglobin: 11.7 g/dL — ABNORMAL LOW (ref 12.0–15.0)
MCH: 30.8 pg (ref 26.0–34.0)
MCHC: 33.1 g/dL (ref 30.0–36.0)
RBC: 3.8 MIL/uL — ABNORMAL LOW (ref 3.87–5.11)

## 2012-05-25 LAB — GLUCOSE, CAPILLARY
Glucose-Capillary: 116 mg/dL — ABNORMAL HIGH (ref 70–99)
Glucose-Capillary: 126 mg/dL — ABNORMAL HIGH (ref 70–99)
Glucose-Capillary: 174 mg/dL — ABNORMAL HIGH (ref 70–99)

## 2012-05-25 LAB — PROTIME-INR: INR: 1.84 — ABNORMAL HIGH (ref 0.00–1.49)

## 2012-05-25 MED ORDER — WARFARIN SODIUM 2 MG PO TABS: 2.0000 mg | ORAL_TABLET | Freq: Every day | ORAL | Status: DC

## 2012-05-25 NOTE — Progress Notes (Signed)
IV start attempted unsucessfully, IV team paged.

## 2012-05-25 NOTE — Progress Notes (Signed)
Pt ambulated 150 ft in hallway with RN and NT. Pt tolerated well, back in bed resting. Will continue to monitor.

## 2012-05-25 NOTE — Progress Notes (Addendum)
                    [address]            [place] [address]          [phone]     5 Days Post-Op Procedure(s) (LRB): MINIMALLY INVASIVE MITRAL VALVE REPAIR (MVR) (Right) MINIMALLY INVASIVE MAZE PROCEDURE (N/A) INTRAOPERATIVE TRANSESOPHAGEAL ECHOCARDIOGRAM (N/A)  Subjective: Stable night, no complaints.   Objective: Vital signs in last 24 hours: Patient Vitals for the past 24 hrs:  BP Temp Temp src Pulse Resp SpO2 Weight  05/25/12 1033 137/62 mmHg - - 73 - - -  05/25/12 0454 149/46 mmHg 98.4 F (36.9 C) Oral 87 20 93 % 157 lb (71.215 kg)  05/24/12 2004 152/48 mmHg 97.9 F (36.6 C) Oral 78 18 96 % -  05/24/12 1400 113/41 mmHg - - 77 - 95 % -  05/24/12 1345 121/65 mmHg - - - - 96 % -  05/24/12 1330 123/47 mmHg - - 77 - 96 % -  05/24/12 1308 130/44 mmHg 98.1 F (36.7 C) Oral 77 16 97 % -  05/24/12 1300 124/43 mmHg - - 78 - 97 % -  05/24/12 1245 135/49 mmHg - - 82 18 98 % -   Current Weight  05/25/12 157 lb (71.215 kg)     Intake/Output from previous day: 03/01 0701 - 03/02 0700 In: 360 [P.O.:360] Out: -   CBGs 152-127-124-116   PHYSICAL EXAM:  Heart: RRR Lungs: Clear Wound: Clean and dry Extremities: No significant LE edema    Lab Results: CBC: Recent Labs  05/23/12 0635 05/25/12 0535  WBC 12.0* 8.2  HGB 12.4 11.7*  HCT 36.3 35.3*  PLT 122* 144*   BMET:  Recent Labs  05/23/12 0635 05/25/12 0535  NA 138 137  K 3.7 4.0  CL 100 100  CO2 28 30  GLUCOSE 84 124*  BUN 36* 38*  CREATININE 0.98 1.05  CALCIUM 9.0 8.6    PT/INR:  Recent Labs  05/25/12 0535  LABPROT 20.6*  INR 1.84*    CXR: Findings: Interval removal of the two right chest tube. No pneumothorax. Right base atelectasis slightly improved. Improving left base atelectasis. Mild cardiomegaly. No effusions.  IMPRESSION:  Interval removal of right chest tubes without visible pneumothorax. Improving bibasilar atelectasis.     Assessment/Plan: S/P  Procedure(s) (LRB): MINIMALLY INVASIVE MITRAL VALVE REPAIR (MVR) (Right) MINIMALLY INVASIVE MAZE PROCEDURE (N/A) INTRAOPERATIVE TRANSESOPHAGEAL ECHOCARDIOGRAM (N/A) CV- stable, SR. Continue Amio, Norvasc, Labetalol, Coumadin.  Vol overload- diuresing well and does not appear to be significantly volume overloaded.  Possibly can decrease Lasix to daily dose.  DM- sugars stable, back on home meds.  Continue CRPI, PT/reconditioning.  Disp- SNF next week when a bed is available.    LOS: 19 days    COLLINS,GINA H 05/25/2012  I have seen and examined the patient and agree with the assessment and plan as outlined.  Likely ready for d/c to SNF tomorrow or Tuesday.  Patient desires to f/u with Dr Gala Romney in [place] rather than locally in [place].  Will need INR's drawn and checked at SNF with results called to [facility] coumadin clinic.  [name],[name] 05/25/2012 12:03 PM

## 2012-05-25 NOTE — Progress Notes (Signed)
Pt ambulated 150 ft in hallway with RN and NT, pt tolerated well. Pt stumbled slightly mid walk, but regained balance with no other concerns. Pt stated she felt fine, back in chair resting. Will continue to monitor.

## 2012-05-25 NOTE — Discharge Summary (Signed)
I agree with the above discharge summary and plan for follow-up.  [name] [name],[name] [name]  

## 2012-05-26 LAB — GLUCOSE, CAPILLARY: Glucose-Capillary: 152 mg/dL — ABNORMAL HIGH (ref 70–99)

## 2012-05-26 LAB — PROTIME-INR
INR: 2.43 — ABNORMAL HIGH (ref 0.00–1.49)
Prothrombin Time: 25.3 seconds — ABNORMAL HIGH (ref 11.6–15.2)

## 2012-05-26 MED ORDER — METFORMIN HCL 500 MG PO TABS
500.0000 mg | ORAL_TABLET | Freq: Two times a day (BID) | ORAL | Status: DC
  Administered 2012-05-26 – 2012-05-27 (×2): 500 mg via ORAL
  Filled 2012-05-26 (×4): qty 1

## 2012-05-26 MED ORDER — FUROSEMIDE 40 MG PO TABS
40.0000 mg | ORAL_TABLET | Freq: Every day | ORAL | Status: DC
  Administered 2012-05-27: 40 mg via ORAL
  Filled 2012-05-26: qty 1

## 2012-05-26 MED ORDER — GLIMEPIRIDE 1 MG PO TABS
1.0000 mg | ORAL_TABLET | Freq: Every day | ORAL | Status: DC
  Administered 2012-05-27: 1 mg via ORAL
  Filled 2012-05-26 (×2): qty 1

## 2012-05-26 MED ORDER — POTASSIUM CHLORIDE CRYS ER 20 MEQ PO TBCR
20.0000 meq | EXTENDED_RELEASE_TABLET | Freq: Every day | ORAL | Status: DC
  Administered 2012-05-26 – 2012-05-27 (×2): 20 meq via ORAL
  Filled 2012-05-26: qty 1

## 2012-05-26 NOTE — Progress Notes (Signed)
   CARDIOTHORACIC SURGERY PROGRESS NOTE  6 Days Post-Op  S/P Procedure(s) (LRB): MINIMALLY INVASIVE MITRAL VALVE REPAIR (MVR) (Right) MINIMALLY INVASIVE MAZE PROCEDURE (N/A) INTRAOPERATIVE TRANSESOPHAGEAL ECHOCARDIOGRAM (N/A)  Subjective: No complaints except patient just found out a friend had passed away.    Objective: Vital signs in last 24 hours: Temp:  [97.9 F (36.6 C)-98.5 F (36.9 C)] 98.5 F (36.9 C) (03/03 0346) Pulse Rate:  [73-83] 83 (03/03 0346) Cardiac Rhythm:  [-] Normal sinus rhythm (03/02 1955) Resp:  [16] 16 (03/03 0346) BP: (119-137)/(43-62) 128/47 mmHg (03/03 0346) SpO2:  [92 %-96 %] 92 % (03/03 0346) Weight:  [68.7 kg (151 lb 7.3 oz)] 68.7 kg (151 lb 7.3 oz) (03/03 0700)  Physical Exam:  Rhythm:   sinus  Breath sounds: clear  Heart sounds:  RRR w/out murmur  Incisions:  Clean and dry  Abdomen:  soft  Extremities:  Warm, no edema   Intake/Output from previous day: 03/02 0701 - 03/03 0700 In: 840 [P.O.:840] Out: 450 [Urine:450] Intake/Output this shift: Total I/O In: 200 [P.O.:200] Out: 550 [Urine:550]  Lab Results:  Recent Labs  05/25/12 0535  WBC 8.2  HGB 11.7*  HCT 35.3*  PLT 144*   BMET:  Recent Labs  05/25/12 0535  NA 137  K 4.0  CL 100  CO2 30  GLUCOSE 124*  BUN 38*  CREATININE 1.05  CALCIUM 8.6    CBG (last 3)   Recent Labs  05/25/12 1627 05/25/12 2130 05/26/12 0556  GLUCAP 145* 174* 118*   PT/INR:   Recent Labs  05/26/12 0545  LABPROT 25.3*  INR 2.43*    CXR:  N/A  Assessment/Plan: S/P Procedure(s) (LRB): MINIMALLY INVASIVE MITRAL VALVE REPAIR (MVR) (Right) MINIMALLY INVASIVE MAZE PROCEDURE (N/A) INTRAOPERATIVE TRANSESOPHAGEAL ECHOCARDIOGRAM (N/A)  Progressing well Expected post op acute blood loss anemia, mild, stable Expected post op volume excess, essentially resolved Maintaining NSR Therapeutic on coumadin, INR still slowly rising Type II diabetes mellitus, good glycemic control   Decrease  lasix and K+ to once daily  Recheck INR tomorrow  Ready for d/c to SNF tomorrow    [name],[name] 05/26/2012 8:27 AM

## 2012-05-26 NOTE — Progress Notes (Signed)
CARDIAC REHAB PHASE I   PRE:  Rate/Rhythm: 86SR  BP:  Supine: 123/61  Sitting:   Standing:    SaO2: 96%RA  MODE:  Ambulation: 550 ft   POST:  Rate/Rhythem: 102  BP:  Supine:   Sitting: 139/62  Standing:    SaO2: 97%RA 2130-8657 Pt walked 550 ft on RA with gait belt and rolling walker and asst x 2. Pt able to bear weight on both legs. Tolerated well. To recliner after walk. Call bell in reach. Can be asst x 1.  [name] [name]

## 2012-05-26 NOTE — Progress Notes (Signed)
Patient Name: [name] [name] Date of Encounter: 05/26/2012     Principal Problem:   S/P mitral valve repair Active Problems:   Cardiac arrest   Shock   Encephalopathy acute   Hyperglycemia   Diabetes type 2, uncontrolled   Acute respiratory failure   Cardiogenic shock   Acute diastolic heart failure   Mitral regurgitation   Atrial fibrillation   Pleural effusion   Hematoma   S/P Maze operation for atrial fibrillation    SUBJECTIVE  Doing well.  Alert.  Maintaining NSR on Amiodarone.    CURRENT MEDS . amiodarone  200 mg Oral BID PC  . amLODipine  5 mg Oral Daily  . aspirin EC  81 mg Oral Daily  . bisacodyl  10 mg Oral Daily   Or  . bisacodyl  10 mg Rectal Daily  . citalopram  10 mg Oral Daily  . docusate sodium  200 mg Oral Daily  . feeding supplement  237 mL Oral BID BM  . furosemide  40 mg Oral BID  . glimepiride  1 mg Oral Q breakfast  . insulin aspart  0-24 Units Subcutaneous TID AC & HS  . metFORMIN  500 mg Oral BID WC  . pantoprazole  40 mg Oral Daily  . potassium chloride  20 mEq Oral BID  . sodium chloride  3 mL Intravenous Q12H  . traZODone  50 mg Oral QHS  . warfarin  2 mg Oral q1800  . Warfarin - Physician Dosing Inpatient   Does not apply q1800    OBJECTIVE  Filed Vitals:   05/25/12 1033 05/25/12 1255 05/25/12 1946 05/26/12 0346  BP: 137/62 122/43 119/52 128/47  Pulse: 73 80 81 83  Temp:  97.9 F (36.6 C) 98.5 F (36.9 C) 98.5 F (36.9 C)  TempSrc:  Oral Oral Oral  Resp:  16 16 16   Height:      Weight:      SpO2:  96% 94% 92%    Intake/Output Summary (Last 24 hours) at 05/26/12 0746 Last data filed at 05/26/12 0725  Gross per 24 hour  Intake   1040 ml  Output   1000 ml  Net     40 ml   Filed Weights   05/23/12 0510 05/24/12 0353 05/25/12 0454  Weight: 159 lb 6.3 oz (72.3 kg) 161 lb 3.2 oz (73.12 kg) 157 lb (71.215 kg)    PHYSICAL EXAM  General: Pleasant, NAD. Neuro: Alert and oriented X 3. Moves all extremities  spontaneously. Psych: Normal affect. HEENT:  Normal  Neck: Supple without bruits or JVD. Lungs:  Resp regular and unlabored, some decrease BS in R base.  Incision over this area.   Heart: RRR no s3, s4, or murmurs. Abdomen: Soft, non-tender, non-distended, BS + x 4.  Extremities: No clubbing, cyanosis or edema. DP/PT/Radials 2+ and equal bilaterally.  Accessory Clinical Findings  CBC  Recent Labs  05/25/12 0535  WBC 8.2  HGB 11.7*  HCT 35.3*  MCV 92.9  PLT 144*   Basic Metabolic Panel  Recent Labs  05/25/12 0535  NA 137  K 4.0  CL 100  CO2 30  GLUCOSE 124*  BUN 38*  CREATININE 1.05  CALCIUM 8.6   Liver Function Tests No results found for this basename: AST, ALT, ALKPHOS, BILITOT, PROT, ALBUMIN,  in the last 72 hours No results found for this basename: LIPASE, AMYLASE,  in the last 72 hours Cardiac Enzymes No results found for this basename: CKTOTAL, CKMB, CKMBINDEX, TROPONINI,  in the last 72 hours BNP No components found with this basename: POCBNP,  D-Dimer No results found for this basename: DDIMER,  in the last 72 hours Hemoglobin A1C No results found for this basename: HGBA1C,  in the last 72 hours Fasting Lipid Panel No results found for this basename: CHOL, HDL, LDLCALC, TRIG, CHOLHDL, LDLDIRECT,  in the last 72 hours Thyroid Function Tests No results found for this basename: TSH, T4TOTAL, FREET3, T3FREE, THYROIDAB,  in the last 72 hours  TELE  NSR.  On amiodarone  ECG    Radiology/Studies  Dg Chest 2 View  05/25/2012  *RADIOLOGY REPORT*  Clinical Data: Follow-up atelectasis.  CHEST - 2 VIEW  Comparison: 05/24/2012  Findings: Interval removal of the two right chest tube.  No pneumothorax.  Right base atelectasis slightly improved.  Improving left base atelectasis.  Mild cardiomegaly.  No effusions.  IMPRESSION: Interval removal of right chest tubes without visible pneumothorax. Improving bibasilar atelectasis.   Original Report Authenticated By:  Charlett Nose, M.D.    Dg Chest 2 View  05/13/2012  *RADIOLOGY REPORT*  Clinical Data: Follow-up CHF and effusions.  CHEST - 2 VIEW  Comparison: 05/08/2012  Findings: Cardiomegaly.  On prior bilateral effusions have resolved.  No pleural effusions currently.  No focal airspace opacities or edema.  No acute bony abnormality.  Left central line remains in place, unchanged.  IMPRESSION: Cardiomegaly.  Resolution of the previously seen effusion.  No focal opacities currently.   Original Report Authenticated By: Charlett Nose, M.D.    Dg Knee 1-2 Views Right  05/23/2012  *RADIOLOGY REPORT*  Clinical Data: Right knee pain and inability to bear weight.  RIGHT KNEE - 1-2 VIEW  Comparison: None.  Findings: No evidence of fracture or dislocation.  No evidence of knee joint effusion.  Mild tricompartmental degenerative spurring is seen without significant joint space narrowing.  No other significant bone abnormality identified.  Soft tissues are unremarkable.  IMPRESSION:  1.  No acute findings. 2.  Mild tricompartmental degenerative spurring.   Original Report Authenticated By: Myles Rosenthal, M.D.    Dg Chest Port 1 View  05/24/2012  *RADIOLOGY REPORT*  Clinical Data: Right effusion, status post MVR  PORTABLE CHEST - 1 VIEW  Comparison: 05/22/2012  Findings: Right apical chest tube.  Associated tiny right apical pneumothorax is no longer visualized.  Linear scarring/atelectasis in the right lower lobe.  Tiny left pleural effusion, improved.  Cardiomegaly.  Mitral valve annuloplasty.  IMPRESSION: Right apical chest tube.  Associated tiny right apical pneumothorax is no longer visualized.  Tiny left pleural effusion, improved.   Original Report Authenticated By: Charline Bills, M.D.    Dg Chest Portable 1 View In Am  05/22/2012  *RADIOLOGY REPORT*  Clinical Data: Postop mitral valve repair, follow-up  PORTABLE CHEST - 1 VIEW  Comparison: Portable chest x-ray of 05/21/2012  Findings: The Swan-Ganz catheter has been removed  with a venous sheath remaining. The right chest tube remains and the may be a tiny right apical pneumothorax present.  Pleural and parenchymal opacity at the right lung base is stable with left basilar linear atelectasis noted as well.  IMPRESSION: Swan-Ganz catheter removed.  Little change in basilar atelectasis right greater than left with right chest tube remaining.  Tiny right apical pneumothorax.   Original Report Authenticated By: Dwyane Dee, M.D.    Dg Chest Portable 1 View In Am  05/21/2012  *RADIOLOGY REPORT*  Clinical Data: Postop mitral valve repair  PORTABLE CHEST - 1 VIEW  Comparison: Portable chest x-ray of 05/20/2012  Findings: The endotracheal tube has been removed.  There is little change in aeration with some increase in basilar atelectasis right greater than left.  Two right chest tubes remain and no definite pneumothorax is seen.  Left central venous line and Swan-Ganz catheter remain.  IMPRESSION: Endotracheal tube removed.  Slight increase in bibasilar linear atelectasis.   Original Report Authenticated By: Dwyane Dee, M.D.    Dg Chest Portable 1 View  05/20/2012  *RADIOLOGY REPORT*  Clinical Data: Status post mitral valve repair  PORTABLE CHEST - 1 VIEW  Comparison: 05/13/2012  Findings: ET tube tip is above the carina.  There is a pulmonary arterial catheter with tip in the main pulmonary artery. Left subclavian catheter tip is in the projection of the right atrium. 2.  Right-sided chest tubes are noted.  No pneumothorax is identified.  There is no pleural effusion or edema.  Lung volumes are low and there is atelectasis in the right base and left lower lobe.  IMPRESSION:  1.  Right-sided chest tube without pneumothorax. 2.  Low lung volumes with bibasilar atelectasis.   Original Report Authenticated By: Signa Kell, M.D.    Dg Chest Port 1 View  05/08/2012  *RADIOLOGY REPORT*  Clinical Data: Post extubation.  Congestive heart failure.  PORTABLE CHEST - 1 VIEW  Comparison: Chest  x-ray 05/07/2012.  Findings: Nasogastric tube and endotracheal tubes have been removed. There is a left-sided subclavian central venous catheter with tip terminating in the distal superior vena cava. Lung volumes have slightly improved.  Right pleural effusion has nearly completely resolved.  Left pleural effusion is now small. Bibasilar opacities favored to represent resolving areas of subsegmental atelectasis have diminished.  Pulmonary vasculature is not normal.  Heart size is borderline enlarged.  Mediastinal contours are otherwise unremarkable.  Atherosclerosis in the thoracic aorta.  IMPRESSION: 1.  Support apparatus, as above. 2.  Significantly improved aeration related to decreasing bilateral pleural effusions and resolving bibasilar subsegmental atelectasis. 3.  Atherosclerosis.   Original Report Authenticated By: Trudie Reed, M.D.    Dg Chest Port 1 View  05/07/2012  *RADIOLOGY REPORT*  Clinical Data: Evaluate endotracheal tube and line placement.  PORTABLE CHEST - 1 VIEW  Comparison: Chest x-ray 05/06/2012.  Findings: An endotracheal tube is in place with tip 5.0 cm above the carina. There is a left-sided subclavian central venous catheter with tip terminating in the superior cavoatrial junction. A nasogastric tube is seen extending into the stomach, however, the tip of the nasogastric tube extends below the lower margin of the image.  Lung volumes are low.  Extensive bibasilar opacities may reflect areas of atelectasis and/or consolidation with superimposed moderate bilateral pleural effusions (left greater than right). Mild cardiomegaly is unchanged.  Pulmonary venous engorgement, without frank pulmonary edema.  Upper mediastinal contours are within normal limits allowing for slight patient rotation to the right.  Atherosclerosis in the thoracic aorta.  IMPRESSION: 1.  Support apparatus, as above. 2.  Bibasilar atelectasis and/or consolidation with superimposed moderate bilateral pleural  effusions. 3.  Atherosclerosis. 4.  Mild cardiomegaly.   Original Report Authenticated By: Trudie Reed, M.D.    Dg Chest Port 1 View  05/06/2012  *RADIOLOGY REPORT*  Clinical Data: Endotracheal tube and central line placement.  PORTABLE CHEST - 1 VIEW  Comparison: Chest radiograph performed 05/05/2012  Findings: The patient's endotracheal tube is seen ending 3-4 cm above the carina.  The left subclavian line is noted ending just within the right  atrium; this could be retracted 1-2 cm.  Small bilateral pleural effusions are again seen, with bibasilar airspace opacification.  This may reflect mild interstitial edema. No pneumothorax is identified.  The cardiomediastinal silhouette is borderline enlarged. Calcification is noted in the aortic arch.  No acute osseous abnormalities are seen.  Degenerative change is noted at both glenohumeral joints.  IMPRESSION:  1.  Endotracheal tube seen ending 3-4 cm above the carina. 2.  Left subclavian line seen ending just within the right atrium; this could be retracted 1-2 cm. 3.  Small bilateral pleural effusions, with bibasilar airspace opacification; this may reflect mild interstitial edema. 4.  Borderline cardiomegaly.  These results were called by telephone on 05/06/2012 at 02:27 a.m. to [facility] on [id number], who verbally acknowledged these results.   Original Report Authenticated By: Tonia Ghent, M.D.     ASSESSMENT AND PLAN  1.  Recurrent persistent atrial fibrillation  -- currently in NSR.  ON warfarin.  2.  SP mitral repair for severe central MR through R lateral thoracotomy procedure.  3.  Possible pre-renal azotemia   Currently on warfarin and Amiodarone.  Will need warfarin called to [place] [facility] for management.  Dr. Gala Romney said he would follow.  ? Reduce furosemide and KCl dose at dc with early follow up.    Signed, Shawnie Pons MD, [facility], FSCAI

## 2012-05-27 LAB — GLUCOSE, CAPILLARY

## 2012-05-27 MED ORDER — TRAMADOL HCL 50 MG PO TABS: 50.0000 mg | ORAL_TABLET | ORAL | Status: DC | PRN

## 2012-05-27 MED ORDER — WARFARIN SODIUM 1 MG PO TABS: 1.0000 mg | ORAL_TABLET | Freq: Every day | ORAL | Status: DC

## 2012-05-27 NOTE — Progress Notes (Signed)
[id number] Education completed with pt and family. Understanding voiced. Permission given to refer to [place] [facility].  Offered to show d/c video but family states has read booklet and felt comfortable with information. Charlene DunlapRN

## 2012-05-27 NOTE — Evaluation (Signed)
Physical Therapy Evaluation Patient Details Name: [name] [name] MRN: [id number] DOB: 1925-01-09 Today's Date: 05/27/2012 Time: 4098-1191 PT Time Calculation (min): 39 min  PT Eval ordered on 2/28, but was acknowledged in Epic by Nsg staff resulting in PT not receiving new order when it was written.    PT Assessment / Plan / Recommendation Clinical Impression  pt presents with Cardiac Arrest, L groin hematoma, and MVR on 2/25.  pt weak and debilitated and will continue to need A for all mobility.      PT Assessment  Patient needs continued PT services    Follow Up Recommendations  SNF    Does the patient have the potential to tolerate intense rehabilitation      Barriers to Discharge None      Equipment Recommendations  None recommended by PT    Recommendations for Other Services     Frequency Min 3X/week    Precautions / Restrictions Precautions Precautions: Fall Precaution Comments: left groin hematoma Restrictions Weight Bearing Restrictions: No   Pertinent Vitals/Pain Indicates pain 3-4/10.  RN made aware and gave pain meds.        Mobility  Bed Mobility Bed Mobility: Not assessed Transfers Transfers: Sit to Stand;Stand to Sit Sit to Stand: 3: Mod assist;With upper extremity assist;From chair/3-in-1 Stand to Sit: 4: Min assist;To chair/3-in-1 Details for Transfer Assistance: cues to minimize use of R UE due to R sided incisions.   Ambulation/Gait Ambulation/Gait Assistance: 4: Min guard Ambulation Distance (Feet): 150 Feet Assistive device: Rolling walker Ambulation/Gait Assistance Details: cues for staying close to RW.   Gait Pattern: Step-through pattern;Decreased stride length Stairs: No Wheelchair Mobility Wheelchair Mobility: No    Exercises     PT Diagnosis: Difficulty walking;Generalized weakness  PT Problem List: Decreased strength;Decreased activity tolerance;Decreased balance;Decreased mobility;Decreased knowledge of use of DME;Pain PT Treatment  Interventions: DME instruction;Gait training;Functional mobility training;Therapeutic activities;Therapeutic exercise;Balance training;Patient/family education   PT Goals Acute Rehab PT Goals PT Goal Formulation: With patient Time For Goal Achievement: 06/10/12 Potential to Achieve Goals: Good Pt will go Supine/Side to Sit: with modified independence PT Goal: Supine/Side to Sit - Progress: Goal set today Pt will go Sit to Supine/Side: with modified independence PT Goal: Sit to Supine/Side - Progress: Goal set today Pt will go Sit to Stand: with modified independence PT Goal: Sit to Stand - Progress: Goal set today Pt will go Stand to Sit: with modified independence PT Goal: Stand to Sit - Progress: Goal set today Pt will Ambulate: >150 feet;with modified independence;with least restrictive assistive device PT Goal: Ambulate - Progress: Goal set today  Visit Information  Last PT Received On: 05/27/12 Assistance Needed: +1    Subjective Data  Subjective: It's just so sore.   Patient Stated Goal: Home   Prior Functioning  Home Living Lives With: Alone Available Help at Discharge: Family;Available PRN/intermittently Type of Home: House Home Access: Ramped entrance Home Layout: One level Bathroom Shower/Tub: Walk-in shower Home Adaptive Equipment: Bedside commode/3-in-1;Built-in shower seat;Grab bars in shower;Grab bars around toilet;Straight cane;Walker - rolling;Wheelchair - manual Prior Function Level of Independence: Needs assistance Needs Assistance: Light Housekeeping Light Housekeeping: Maximal Driving: Yes Vocation: Retired Musician: No difficulties    Copywriter, advertising Overall Cognitive Status: Appears within functional limits for tasks assessed/performed Arousal/Alertness: Awake/alert Orientation Level: Appears intact for tasks assessed Behavior During Session: [facility] for tasks performed    Extremity/Trunk Assessment Right Lower Extremity  Assessment RLE ROM/Strength/Tone: Deficits RLE ROM/Strength/Tone Deficits: grossly 4/5 Left Lower Extremity Assessment LLE  ROM/Strength/Tone: Deficits LLE ROM/Strength/Tone Deficits: grossly 4/5, limited by pain Trunk Assessment Trunk Assessment: Normal   Balance Balance Balance Assessed: No  End of Session PT - End of Session Equipment Utilized During Treatment: Gait belt Activity Tolerance: Patient limited by pain;Patient limited by fatigue Patient left: in chair;with call bell/phone within reach;with nursing in room Nurse Communication: Mobility status  GP     Sunny Schlein, [place] [phone] 05/27/2012, 11:10 AM

## 2012-05-27 NOTE — Progress Notes (Addendum)
7 Days Post-Op Procedure(s) (LRB): MINIMALLY INVASIVE MITRAL VALVE REPAIR (MVR) (Right) MINIMALLY INVASIVE MAZE PROCEDURE (N/A) INTRAOPERATIVE TRANSESOPHAGEAL ECHOCARDIOGRAM (N/A) Subjective:  [name] [name] has no complaints this morning.  She is hoping to be discharged to the rehab facility today. +BM  Objective: Vital signs in last 24 hours: Temp:  [98.2 F (36.8 C)-98.6 F (37 C)] 98.6 F (37 C) (03/04 0455) Pulse Rate:  [80-83] 80 (03/04 0455) Cardiac Rhythm:  [-] Normal sinus rhythm;Heart block (03/03 2030) Resp:  [16-18] 18 (03/04 0455) BP: (116-134)/(50-73) 116/73 mmHg (03/04 0455) SpO2:  [90 %-96 %] 90 % (03/04 0455) Weight:  [151 lb 10.8 oz (68.8 kg)] 151 lb 10.8 oz (68.8 kg) (03/04 0455)  Intake/Output from previous day: 03/03 0701 - 03/04 0700 In: 680 [P.O.:680] Out: 1692 [Urine:1690; Stool:2]  General appearance: alert, cooperative and no distress Neurologic: intact Heart: regular rate and rhythm Lungs: clear to auscultation bilaterally Abdomen: soft, non-tender; bowel sounds normal; no masses,  no organomegaly Extremities: extremities normal, atraumatic, no cyanosis or edema Wound: clean and dry  Lab Results:  Recent Labs  05/25/12 0535  WBC 8.2  HGB 11.7*  HCT 35.3*  PLT 144*   BMET:  Recent Labs  05/25/12 0535  NA 137  K 4.0  CL 100  CO2 30  GLUCOSE 124*  BUN 38*  CREATININE 1.05  CALCIUM 8.6    PT/INR:  Recent Labs  05/27/12 0530  LABPROT 28.9*  INR 2.91*   ABG    Component Value Date/Time   PHART 7.468* 05/21/2012 0209   HCO3 29.0* 05/21/2012 0209   TCO2 28 05/21/2012 1619   ACIDBASEDEF 3.0* 05/20/2012 1348   O2SAT 95.0 05/21/2012 0209   CBG (last 3)   Recent Labs  05/26/12 1716 05/26/12 2100 05/27/12 0614  GLUCAP 152* 142* 101*    Assessment/Plan: S/P Procedure(s) (LRB): MINIMALLY INVASIVE MITRAL VALVE REPAIR (MVR) (Right) MINIMALLY INVASIVE MAZE PROCEDURE (N/A) INTRAOPERATIVE TRANSESOPHAGEAL ECHOCARDIOGRAM (N/A)  1.  CV- NSR- on Amiodarone and Norvasc 2. Pulm- no acute issues, continue pulm toilet 3. INR 2.91 this morning- will hold Coumadin tonight, will restart reduced dose tomorrow at 1mg  daily 4. DM- CBGs controlled 5. Dispo- patient doing well, will d/c to SNF today   LOS: 21 days    BARRETT, ERIN 05/27/2012  I have seen and examined the patient and agree with the assessment and plan as outlined.  D/C to SNF on coumadin 1 mg daily.  [name],[name] 05/27/2012 12:25 PM

## 2012-05-27 NOTE — Progress Notes (Signed)
Pt discharged per MD order and protocol to [facility] in [place]. Daughter transporting patient by car. Report called to Peggy at [facility].

## 2012-05-27 NOTE — Progress Notes (Signed)
Patient ambulated times three today per day shift report, last time approximately 150 ft with RW and 2 assist.  Tolerated well.  Will continue to monitor and encourage.  [name] [name]

## 2012-05-28 ENCOUNTER — Encounter: Payer: Self-pay | Admitting: Internal Medicine

## 2012-05-28 LAB — PROTIME-INR: INR: 5.1 — AB (ref 0.9–1.1)

## 2012-05-29 ENCOUNTER — Telehealth (HOSPITAL_COMMUNITY): Payer: Self-pay | Admitting: Cardiology

## 2012-05-29 NOTE — Telephone Encounter (Signed)
Facility called to get an urgent appt as pts HR is 125. Pt was started on Metoprolol 12.5mg  q 8 hrs and HR decreased to 121 Weight stable at 151.9. Pt given next available appt 05/30/12 @ 9:30   Should anything else be done overnight?

## 2012-05-29 NOTE — Telephone Encounter (Signed)
Per Dr Gala Romney, a-flutter probably returned ok for appt tomorrow AM

## 2012-05-30 ENCOUNTER — Ambulatory Visit (HOSPITAL_COMMUNITY): Admission: RE | Admit: 2012-05-30 | Payer: Medicare Other | Source: Ambulatory Visit

## 2012-06-02 ENCOUNTER — Encounter (HOSPITAL_COMMUNITY): Payer: Self-pay

## 2012-06-02 ENCOUNTER — Encounter (HOSPITAL_COMMUNITY): Payer: Self-pay | Admitting: Cardiology

## 2012-06-02 ENCOUNTER — Encounter (HOSPITAL_COMMUNITY): Payer: Self-pay | Admitting: *Deleted

## 2012-06-02 ENCOUNTER — Ambulatory Visit (HOSPITAL_COMMUNITY)
Admission: RE | Admit: 2012-06-02 | Discharge: 2012-06-02 | Disposition: A | Discharge status: Home / Self Care | Payer: Medicare Other | Source: Ambulatory Visit | Attending: Internal Medicine | Admitting: Internal Medicine

## 2012-06-02 VITALS — BP 106/58 | HR 114 | Temp 97.1°F

## 2012-06-02 DIAGNOSIS — L0291 Cutaneous abscess, unspecified: Secondary | ICD-10-CM

## 2012-06-02 DIAGNOSIS — I4891 Unspecified atrial fibrillation: Secondary | ICD-10-CM

## 2012-06-02 DIAGNOSIS — T148XXA Other injury of unspecified body region, initial encounter: Secondary | ICD-10-CM

## 2012-06-02 DIAGNOSIS — L0390 Cellulitis, unspecified: Secondary | ICD-10-CM

## 2012-06-02 DIAGNOSIS — Z9889 Other specified postprocedural states: Secondary | ICD-10-CM

## 2012-06-02 DIAGNOSIS — L089 Local infection of the skin and subcutaneous tissue, unspecified: Secondary | ICD-10-CM

## 2012-06-02 LAB — BASIC METABOLIC PANEL
BUN: 32 mg/dL — ABNORMAL HIGH (ref 6–23)
CO2: 23 mEq/L (ref 19–32)
Calcium: 8.9 mg/dL (ref 8.4–10.5)
Chloride: 100 mEq/L (ref 96–112)
Creatinine, Ser: 1.13 mg/dL — ABNORMAL HIGH (ref 0.50–1.10)
GFR calc Af Amer: 50 mL/min — ABNORMAL LOW (ref >90)
GFR calc non Af Amer: 43 mL/min — ABNORMAL LOW (ref >90)
Glucose, Bld: 124 mg/dL — ABNORMAL HIGH (ref 70–99)
Potassium: 3.8 mEq/L (ref 3.5–5.1)
Sodium: 136 mEq/L (ref 135–145)

## 2012-06-02 LAB — CBC WITH DIFFERENTIAL/PLATELET
Basophils Absolute: 0 10*3/uL (ref 0.0–0.1)
Basophils Relative: 0 % (ref 0–1)
Eosinophils Absolute: 0.1 10*3/uL (ref 0.0–0.7)
Eosinophils Relative: 0 % (ref 0–5)
HCT: 39.6 % (ref 36.0–46.0)
Hemoglobin: 13.3 g/dL (ref 12.0–15.0)
Lymphocytes Relative: 6 % — ABNORMAL LOW (ref 12–46)
Lymphs Abs: 0.7 10*3/uL (ref 0.7–4.0)
MCH: 31.3 pg (ref 26.0–34.0)
MCHC: 33.6 g/dL (ref 30.0–36.0)
MCV: 93.2 fL (ref 78.0–100.0)
Monocytes Absolute: 0.7 10*3/uL (ref 0.1–1.0)
Monocytes Relative: 6 % (ref 3–12)
Neutro Abs: 10.3 10*3/uL — ABNORMAL HIGH (ref 1.7–7.7)
Neutrophils Relative %: 87 % — ABNORMAL HIGH (ref 43–77)
Platelets: 412 10*3/uL — ABNORMAL HIGH (ref 150–400)
RBC: 4.25 MIL/uL (ref 3.87–5.11)
RDW: 15.9 % — ABNORMAL HIGH (ref 11.5–15.5)
WBC: 11.8 10*3/uL — ABNORMAL HIGH (ref 4.0–10.5)

## 2012-06-02 MED ORDER — METOPROLOL TARTRATE 25 MG PO TABS: 25.0000 mg | ORAL_TABLET | Freq: Two times a day (BID) | ORAL | Status: DC

## 2012-06-02 MED ORDER — CEPHALEXIN 500 MG PO CAPS: 500.0000 mg | ORAL_CAPSULE | Freq: Two times a day (BID) | ORAL | Status: DC

## 2012-06-02 MED ORDER — AMIODARONE HCL 200 MG PO TABS: 200.0000 mg | ORAL_TABLET | Freq: Two times a day (BID) | ORAL | Status: DC

## 2012-06-02 NOTE — Patient Instructions (Addendum)
Stop Norvasc.  Increase Amiodarone 200 mg twice a day.  Change Lopressor to 25 mg twice a day.  Your physician has requested that you have a TEE/Cardioversion. During a TEE, sound waves are used to create images of your heart. It provides your doctor with information about the size and shape of your heart and how well your heart's chambers and valves are working. In this test, a transducer is attached to the end of a flexible tube that is guided down you throat and into your esophagus (the tube leading from your mouth to your stomach) to get a more detailed image of your heart. Once the TEE has determined that a blood clot is not present, the cardioversion begins. Electrical Cardioversion uses a jolt of electricity to your heart either through paddles or wired patches attached to your chest. This is a controlled, usually prescheduled, procedure. This procedure is done at the hospital and you are not awake during the procedure. You usually go home the day of the procedure. Please see the instruction sheet given to you today for more information.

## 2012-06-02 NOTE — Progress Notes (Signed)
Patient ID: [name] [name], female   DOB: 1924-04-28, 79 y.o.   MRN: [id number] PCP:  [name] Coombs, MD - [facility]   Weight Range   Baseline proBNP     HPI:  [name] [name] is a very pleasant 79 y/o woman with h/o HTN, HL, DM2. She was admitted last month after suffering a PEA arrest in [facility].   She was transferred to [facility]. Found to have new onset AF with RVR and severe MR due to annular dilation. She underwent heart cath which showed non-obstructive CAD. Unfortunately this was complicated by a large left groin bleed. She eventually underwent Minimally Invasive Mitral Valve Repair utilizing a 26 mm Sorin Memo 3D ring for annuloplasty and a MAZE Procedure on 05/20/12. Was discharged on 05/27/2012.  Last Thursday they noted her HR was back up. Unable to come to clinic due to weather. Started on metoprolol 12.5 q8. Still taking amiodarone 200 daily.   Feels weak. Not eating well. HR still running 100-115. Have stopped PT because of this. No orthopnea or PND. No edema. Weight stable. No f/c     ROS: All systems negative except as listed in HPI, PMH and Problem List.  Past Medical History  Diagnosis Date  . Hypertension   . Hyperlipidemia   . Type 2 diabetes mellitus   . Arthritis   . Coronary artery disease   . Anginal pain   . Shortness of breath   . S/P mitral valve repair 05/20/2012    26mm Sorin Memo 3D ring annuloplasty via right mini thoracotomy approach  . S/P Maze operation for atrial fibrillation 05/20/2012    Complete bi-atrial lesion set using cryothermy    Current Outpatient Prescriptions  Medication Sig Dispense Refill  . amiodarone (PACERONE) 200 MG tablet Take 1 tablet (200 mg total) by mouth 2 (two) times daily after a meal.      . amLODipine (NORVASC) 5 MG tablet Take 5 mg by mouth daily.      Marland Kitchen aspirin EC 81 MG EC tablet Take 1 tablet (81 mg total) by mouth daily.      . citalopram (CELEXA) 10 MG tablet Take 10 mg by mouth daily.      . furosemide (LASIX) 40 MG  tablet Take 1 tablet (40 mg total) by mouth daily. For 7 Days  30 tablet    . glimepiride (AMARYL) 1 MG tablet Take 1 mg by mouth daily before breakfast.      . insulin glargine (LANTUS) 100 UNIT/ML injection Inject 18 Units into the skin at bedtime.      . metFORMIN (GLUCOPHAGE) 500 MG tablet Take 500 mg by mouth 2 (two) times daily with a meal.      . potassium chloride SA (K-DUR,KLOR-CON) 20 MEQ tablet Take 1 tablet (20 mEq total) by mouth daily. For 7 Days      . pravastatin (PRAVACHOL) 40 MG tablet Take 40 mg by mouth daily.      Marland Kitchen rOPINIRole (REQUIP) 0.5 MG tablet Take 0.5 mg by mouth at bedtime.      . traMADol (ULTRAM) 50 MG tablet Take 1-2 tablets (50-100 mg total) by mouth every 4 (four) hours as needed for pain.  30 tablet  0  . traZODone (DESYREL) 50 MG tablet Take 50 mg by mouth at bedtime.      Marland Kitchen warfarin (COUMADIN) 1 MG tablet Take 1 tablet (1 mg total) by mouth daily. Or as directed by [facility]  60 tablet  1  No current facility-administered medications for this encounter.   Facility-Administered Medications Ordered in Other Encounters  Medication Dose Route Frequency Provider Last Rate Last Dose  . fentaNYL (SUBLIMAZE) injection    PRN Ellin Goodie, CRNA   50 mcg at 05/15/12 0700  . midazolam (VERSED) 5 MG/5ML injection    PRN Ellin Goodie, CRNA   0.5 mg at 05/15/12 0700     PHYSICAL EXAM: Filed Vitals:   06/02/12 1516  BP: 106/58  Pulse: 114  Temp: 97.1 F (36.2 C)  SpO2: 97%    General:  Well appearing. No resp difficulty HEENT: normal Neck: supple. JVP flat. Carotids 2+ bilaterally; no bruits. No lymphadenopathy or thryomegaly appreciated. Cor: PMI normal. IRR. IRR tachy  No rubs, gallops or murmurs. R sided chest with erythema and mild purulent drainage from one port site Lungs: clear Abdomen: soft, nontender, nondistended. No hepatosplenomegaly. No bruits or masses. Good bowel sounds. Extremities: no cyanosis, clubbing, rash, edema Neuro: alert  & orientedx3, cranial nerves grossly intact. Moves all 4 extremities w/o difficulty. Affect pleasant.   ECG: AF 114   ASSESSMENT & PLAN:

## 2012-06-04 LAB — PROTIME-INR

## 2012-06-05 ENCOUNTER — Other Ambulatory Visit: Payer: Self-pay | Admitting: *Deleted

## 2012-06-05 DIAGNOSIS — J90 Pleural effusion, not elsewhere classified: Secondary | ICD-10-CM

## 2012-06-06 ENCOUNTER — Encounter (HOSPITAL_COMMUNITY)
Admission: RE | Disposition: A | Discharge status: Home / Self Care | Payer: Self-pay | Source: Ambulatory Visit | Attending: Internal Medicine

## 2012-06-06 ENCOUNTER — Encounter (HOSPITAL_COMMUNITY): Payer: Self-pay | Admitting: Certified Registered Nurse Anesthetist

## 2012-06-06 ENCOUNTER — Ambulatory Visit (HOSPITAL_COMMUNITY): Payer: Medicare Other | Admitting: Certified Registered"

## 2012-06-06 ENCOUNTER — Ambulatory Visit (HOSPITAL_COMMUNITY)
Admission: RE | Admit: 2012-06-06 | Discharge: 2012-06-06 | Disposition: A | Discharge status: Home / Self Care | Payer: Medicare Other | Source: Ambulatory Visit | Attending: Internal Medicine | Admitting: Internal Medicine

## 2012-06-06 ENCOUNTER — Encounter (HOSPITAL_COMMUNITY): Payer: Self-pay | Admitting: Certified Registered"

## 2012-06-06 ENCOUNTER — Ambulatory Visit: Payer: Medicare Other | Admitting: Thoracic Surgery (Cardiothoracic Vascular Surgery)

## 2012-06-06 DIAGNOSIS — E785 Hyperlipidemia, unspecified: Secondary | ICD-10-CM | POA: Insufficient documentation

## 2012-06-06 DIAGNOSIS — L0390 Cellulitis, unspecified: Secondary | ICD-10-CM | POA: Insufficient documentation

## 2012-06-06 DIAGNOSIS — E119 Type 2 diabetes mellitus without complications: Secondary | ICD-10-CM | POA: Insufficient documentation

## 2012-06-06 DIAGNOSIS — I10 Essential (primary) hypertension: Secondary | ICD-10-CM | POA: Insufficient documentation

## 2012-06-06 DIAGNOSIS — I4891 Unspecified atrial fibrillation: Secondary | ICD-10-CM

## 2012-06-06 HISTORY — PX: CARDIOVERSION: SHX1299

## 2012-06-06 HISTORY — PX: TEE WITHOUT CARDIOVERSION: SHX5443

## 2012-06-06 LAB — PROTIME-INR
INR: 2.23 — ABNORMAL HIGH (ref 0.00–1.49)
Prothrombin Time: 23.7 seconds — ABNORMAL HIGH (ref 11.6–15.2)

## 2012-06-06 SURGERY — ECHOCARDIOGRAM, TRANSESOPHAGEAL
Anesthesia: Monitor Anesthesia Care

## 2012-06-06 MED ORDER — LIDOCAINE VISCOUS 2 % MT SOLN
OROMUCOSAL | Status: DC | PRN
  Administered 2012-06-06: 20 mL via OROMUCOSAL

## 2012-06-06 MED ORDER — FENTANYL CITRATE 0.05 MG/ML IJ SOLN
INTRAMUSCULAR | Status: AC
  Filled 2012-06-06: qty 2

## 2012-06-06 MED ORDER — MIDAZOLAM HCL 5 MG/ML IJ SOLN
INTRAMUSCULAR | Status: AC
  Filled 2012-06-06: qty 1

## 2012-06-06 MED ORDER — PROPOFOL 10 MG/ML IV BOLUS
INTRAVENOUS | Status: DC | PRN
  Administered 2012-06-06: 30 mg via INTRAVENOUS

## 2012-06-06 MED ORDER — MIDAZOLAM HCL 10 MG/2ML IJ SOLN
INTRAMUSCULAR | Status: DC | PRN
  Administered 2012-06-06: 2 mg via INTRAVENOUS
  Administered 2012-06-06: 1 mg via INTRAVENOUS

## 2012-06-06 MED ORDER — FENTANYL CITRATE 0.05 MG/ML IJ SOLN
INTRAMUSCULAR | Status: DC | PRN
  Administered 2012-06-06: 25 ug via INTRAVENOUS

## 2012-06-06 MED ORDER — LIDOCAINE VISCOUS 2 % MT SOLN
OROMUCOSAL | Status: AC
  Filled 2012-06-06: qty 15

## 2012-06-06 MED ORDER — SODIUM CHLORIDE 0.9 % IV SOLN
INTRAVENOUS | Status: DC
  Administered 2012-06-06: 13:00:00 via INTRAVENOUS

## 2012-06-06 MED ORDER — SODIUM CHLORIDE 0.9 % IV SOLN
INTRAVENOUS | Status: DC | PRN
  Administered 2012-06-06: 14:00:00 via INTRAVENOUS

## 2012-06-06 NOTE — H&P (View-Only) (Signed)
Patient ID: [name] [name], female   DOB: 04/22/1924, 79 y.o.   MRN: [id number] PCP:  Susan Holt, MD - [facility]   Weight Range   Baseline proBNP     HPI:  [name] [name] is a very pleasant 79 y/o woman with h/o HTN, HL, DM2. She was admitted last month after suffering a PEA arrest in [place] [facility].   She was transferred to [facility]. Found to have new onset AF with RVR and severe MR due to annular dilation. She underwent heart cath which showed non-obstructive CAD. Unfortunately this was complicated by a large left groin bleed. She eventually underwent Minimally Invasive Mitral Valve Repair utilizing a 26 mm Sorin Memo 3D ring for annuloplasty and a MAZE Procedure on 05/20/12. Was discharged on 05/27/2012.  Last Thursday they noted her HR was back up. Unable to come to clinic due to weather. Started on metoprolol 12.5 q8. Still taking amiodarone 200 daily.   Feels weak. Not eating well. HR still running 100-115. Have stopped PT because of this. No orthopnea or PND. No edema. Weight stable. No f/c     ROS: All systems negative except as listed in HPI, PMH and Problem List.  Past Medical History  Diagnosis Date  . Hypertension   . Hyperlipidemia   . Type 2 diabetes mellitus   . Arthritis   . Coronary artery disease   . Anginal pain   . Shortness of breath   . S/P mitral valve repair 05/20/2012    26mm Sorin Memo 3D ring annuloplasty via right mini thoracotomy approach  . S/P Maze operation for atrial fibrillation 05/20/2012    Complete bi-atrial lesion set using cryothermy    Current Outpatient Prescriptions  Medication Sig Dispense Refill  . amiodarone (PACERONE) 200 MG tablet Take 1 tablet (200 mg total) by mouth 2 (two) times daily after a meal.      . amLODipine (NORVASC) 5 MG tablet Take 5 mg by mouth daily.      . aspirin EC 81 MG EC tablet Take 1 tablet (81 mg total) by mouth daily.      . citalopram (CELEXA) 10 MG tablet Take 10 mg by mouth daily.      . furosemide (LASIX) 40 MG  tablet Take 1 tablet (40 mg total) by mouth daily. For 7 Days  30 tablet    . glimepiride (AMARYL) 1 MG tablet Take 1 mg by mouth daily before breakfast.      . insulin glargine (LANTUS) 100 UNIT/ML injection Inject 18 Units into the skin at bedtime.      . metFORMIN (GLUCOPHAGE) 500 MG tablet Take 500 mg by mouth 2 (two) times daily with a meal.      . potassium chloride SA (K-DUR,KLOR-CON) 20 MEQ tablet Take 1 tablet (20 mEq total) by mouth daily. For 7 Days      . pravastatin (PRAVACHOL) 40 MG tablet Take 40 mg by mouth daily.      . rOPINIRole (REQUIP) 0.5 MG tablet Take 0.5 mg by mouth at bedtime.      . traMADol (ULTRAM) 50 MG tablet Take 1-2 tablets (50-100 mg total) by mouth every 4 (four) hours as needed for pain.  30 tablet  0  . traZODone (DESYREL) 50 MG tablet Take 50 mg by mouth at bedtime.      . warfarin (COUMADIN) 1 MG tablet Take 1 tablet (1 mg total) by mouth daily. Or as directed by Coumadin Clinic  60 tablet  1     No current facility-administered medications for this encounter.   Facility-Administered Medications Ordered in Other Encounters  Medication Dose Route Frequency Deshay Blumenfeld Last Rate Last Dose  . fentaNYL (SUBLIMAZE) injection    PRN Holly M Weaver, CRNA   50 mcg at 05/15/12 0700  . midazolam (VERSED) 5 MG/5ML injection    PRN Holly M Weaver, CRNA   0.5 mg at 05/15/12 0700     PHYSICAL EXAM: Filed Vitals:   06/02/12 1516  BP: 106/58  Pulse: 114  Temp: 97.1 F (36.2 C)  SpO2: 97%    General:  Well appearing. No resp difficulty HEENT: normal Neck: supple. JVP flat. Carotids 2+ bilaterally; no bruits. No lymphadenopathy or thryomegaly appreciated. Cor: PMI normal. IRR. IRR tachy  No rubs, gallops or murmurs. R sided chest with erythema and mild purulent drainage from one port site Lungs: clear Abdomen: soft, nontender, nondistended. No hepatosplenomegaly. No bruits or masses. Good bowel sounds. Extremities: no cyanosis, clubbing, rash, edema Neuro: alert  & orientedx3, cranial nerves grossly intact. Moves all 4 extremities w/o difficulty. Affect pleasant.   ECG: AF 114   ASSESSMENT & PLAN:  

## 2012-06-06 NOTE — Transfer of Care (Addendum)
Immediate Anesthesia Transfer of Care Note  Patient: [name] [name]  Procedure(s) Performed: Procedure(s): TRANSESOPHAGEAL ECHOCARDIOGRAM (TEE) (N/A) CARDIOVERSION (N/A)  Patient Location: endoscopy  Anesthesia Type:MAC  Level of Consciousness: awake, alert  and oriented  Airway & Oxygen Therapy: Patient Spontanous Breathing and Patient connected to nasal cannula oxygen  Post-op Assessment: Report given to PACU RN, Post -op Vital signs reviewed and stable and Patient moving all extremities  Post vital signs: Reviewed and stable  Complications: No apparent anesthesia complications

## 2012-06-06 NOTE — Progress Notes (Signed)
Glucometer error-CBG showed greater than 600 due to battery problem. Changed battery and rechecked CBG after 15 minutes and was 71.

## 2012-06-06 NOTE — Preoperative (Signed)
Beta Blockers   Reason not to administer Beta Blockers:Not Applicable 

## 2012-06-06 NOTE — Anesthesia Preprocedure Evaluation (Addendum)
Anesthesia Evaluation  Patient identified by MRN, date of birth, ID band Patient awake    Reviewed: Allergy & Precautions, H&P , NPO status , Patient's Chart, lab work & pertinent test results  History of Anesthesia Complications Negative for: history of anesthetic complications  Airway Mallampati: II TM Distance: >3 FB Neck ROM: Full    Dental  (+) Dental Advisory Given   Pulmonary shortness of breath,          Cardiovascular hypertension, Pt. on medications + angina with exertion + CAD + dysrhythmias Atrial Fibrillation + Valvular Problems/Murmurs (S/P MVR and Maze Procedure 05/20/12) MR Rhythm:Irregular Rate:Normal     Neuro/Psych    GI/Hepatic negative GI ROS, Neg liver ROS,   Endo/Other  diabetes, Well Controlled, Type 2, Insulin Dependent  Renal/GU negative Renal ROS  negative genitourinary   Musculoskeletal   Abdominal   Peds  Hematology   Anesthesia Other Findings   Reproductive/Obstetrics                          Anesthesia Physical Anesthesia Plan  ASA: III  Anesthesia Plan: MAC   Post-op Pain Management:    Induction: Intravenous  Airway Management Planned: Simple Face Mask  Additional Equipment:   Intra-op Plan:   Post-operative Plan:   Informed Consent: I have reviewed the patients History and Physical, chart, labs and discussed the procedure including the risks, benefits and alternatives for the proposed anesthesia with the patient or authorized representative who has indicated his/her understanding and acceptance.   Dental advisory given and History available from chart only  Plan Discussed with: CRNA, Anesthesiologist and Surgeon  Anesthesia Plan Comments:        Anesthesia Quick Evaluation

## 2012-06-06 NOTE — CV Procedure (Signed)
    TRANSESOPHAGEAL ECHOCARDIOGRAM GUIDED DIRECT CURRENT CARDIOVERSION  NAME:  [name] [name]   MRN: [id number] DOB:  06-08-1924   ADMIT DATE: 06/06/2012  INDICATIONS: AF with RVR   PROCEDURE:   Informed consent was obtained prior to the procedure. The risks, benefits and alternatives for the procedure were discussed and the patient comprehended these risks.  Risks include, but are not limited to, cough, sore throat, vomiting, nausea, somnolence, esophageal and stomach trauma or perforation, bleeding, low blood pressure, aspiration, pneumonia, infection, trauma to the teeth and death.    After a procedural time-out, the patient was given 3 mg versed and 25 mcg fentanyl for moderate sedation.  The oropharynx was anesthetized 10 cc of topical 1% viscous lidocaine.  The transesophageal probe was inserted in the esophagus and stomach without difficulty and multiple views were obtained.    FINDINGS:  LEFT VENTRICLE: EF = 65-70% + LVH. No wall motion abnormalities.   RIGHT VENTRICLE: Normal  LEFT ATRIUM: Moderately to severely dilated. ~5.4cm  LEFT ATRIAL APPENDAGE: No clot  RIGHT ATRIUM: Moderately to severely dialted  AORTIC VALVE:  Trileaflet. Mildly thick. Mild AI  MITRAL VALVE:    + ring. Mild MR  TRICUSPID VALVE: Normal. Moderate MR.  PULMONIC VALVE: Normal trivial TR  INTERATRIAL SEPTUM: not well visualized  PERICARDIUM: no efusion  DESCENDING AORTA: not visualized   CARDIOVERSION:     Indications:  Atrial Fibrillation  Procedure Details:  Once the TEE was complete, the patient had the defibrillator pads placed in the anterior and posterior position. The patient then underwent further sedation with propofol 30mg  by the anesthesia service for cardioversion. Once an appropriate level of sedation was achieved, the patient received a single biphasic, synchronized 150J shock with prompt conversion to sinus rhythm. No apparent complications.  COMPLICATIONS:    There were no  immediate complications.   CONCLUSION:   1.  Successful TEE guided cardioversion. Continue amiodarone at increased dose for now in light of significant LA dilation.    Daniel Bensimhon,MD 1:48 PM

## 2012-06-06 NOTE — Assessment & Plan Note (Addendum)
This is recurrent. She again has RVR. She will not tolerate this for long. Will increase amiodarone and plan TEE/DC-CV later this week. Continue coumadin. Watch INR closely on increased dose of amio and with addition of abx. Will increase lopressor to 25 bid.

## 2012-06-06 NOTE — Progress Notes (Signed)
  Echocardiogram Echocardiogram Transesophageal has been performed.  Jorje Guild 06/06/2012, 1:55 PM

## 2012-06-06 NOTE — Assessment & Plan Note (Signed)
Stable. No MR on exam

## 2012-06-06 NOTE — Interval H&P Note (Signed)
History and Physical Interval Note:  06/06/2012 1:32 PM  [name] [name]  has presented today for surgery, with the diagnosis of a-fib  The various methods of treatment have been discussed with the patient and family. After consideration of risks, benefits and other options for treatment, the patient has consented to  Procedure(s): TRANSESOPHAGEAL ECHOCARDIOGRAM (TEE) (N/A) CARDIOVERSION (N/A) as a surgical intervention .  The patient's history has been reviewed, patient examined, no change in status, stable for surgery.  I have reviewed the patient's chart and labs.  Questions were answered to the patient's satisfaction.     Daniel Bensimhon

## 2012-06-06 NOTE — Anesthesia Postprocedure Evaluation (Signed)
  Anesthesia Post-op Note  Patient: [name] [name]  Procedure(s) Performed: Procedure(s): TRANSESOPHAGEAL ECHOCARDIOGRAM (TEE) (N/A) CARDIOVERSION (N/A)  Patient Location: Endoscopy Unit  Anesthesia Type:MAC  Level of Consciousness: awake, alert  and oriented  Airway and Oxygen Therapy: Patient Spontanous Breathing and Patient connected to nasal cannula oxygen  Post-op Pain: none  Post-op Assessment: Post-op Vital signs reviewed, Patient's Cardiovascular Status Stable, Respiratory Function Stable, Patent Airway, No signs of Nausea or vomiting and Pain level controlled  Post-op Vital Signs: Reviewed and stable  Complications: No apparent anesthesia complications

## 2012-06-06 NOTE — Assessment & Plan Note (Signed)
Her wound appears infected with draining trace and surrounding cellulitis. I discussed with Dr. Cornelius Moras. Will start Keflex. Instructed patient to wash area 3x daily with soap and water and keep dry. Wound care to be followed at [facility].

## 2012-06-09 ENCOUNTER — Encounter (HOSPITAL_COMMUNITY): Payer: Self-pay | Admitting: Internal Medicine

## 2012-06-09 ENCOUNTER — Ambulatory Visit: Payer: Medicare Other | Admitting: Thoracic Surgery (Cardiothoracic Vascular Surgery)

## 2012-06-10 NOTE — Clinical Social Work Placement (Signed)
Late entry 06/10/12 for 05/27/12  Clinical Social Work Department CLINICAL SOCIAL WORK PLACEMENT NOTE 06/10/2012  Patient:  [name] [name], [name] [name]  Account Number:  [id number] Admit date:  05/06/2012  Clinical Social Worker:  Frederico Hamman, LCSW  Date/time:  05/22/2012 12:00 M  Clinical Social Work is seeking post-discharge placement for this patient at the following level of care:   SKILLED NURSING   (*CSW will update this form in Epic as items are completed)   05/22/2012  Patient/family provided with [facility] Department of Clinical Social Work's list of facilities offering this level of care within the geographic area requested by the patient (or if unable, by the patient's family).  05/22/2012  Patient/family informed of their freedom to choose among providers that offer the needed level of care, that participate in Medicare, Medicaid or managed care program needed by the patient, have an available bed and are willing to accept the patient.  05/22/2012  Patient/family informed of [facility]' ownership interest in [facility], as well as of the fact that they are under no obligation to receive care at this facility.  PASARR submitted to EDS on  PASARR number received from EDS on   FL2 transmitted to all facilities in geographic area requested by pt/family on  05/22/2012 FL2 transmitted to all facilities within larger geographic area on   Patient informed that his/her managed care company has contracts with or will negotiate with  certain facilities, including the following:     Patient/family informed of bed offers received:  05/23/2012 Patient chooses bed at  Physician recommends and patient chooses bed at    Patient to be transferred to  on  05/27/2012 Patient to be transferred to facility by family  The following physician request were entered in Epic:   Additional Comments: Existing Pasarr on file.  Pt dc to [facility] [place] with family providing transport.  DC info  sent to [facility]. No further CSW needs. Family and Pt are pleased with SNF arrangements.  Frederico Hamman, LCSW [phone]

## 2012-06-12 ENCOUNTER — Encounter (HOSPITAL_COMMUNITY): Payer: Self-pay

## 2012-06-12 ENCOUNTER — Ambulatory Visit (HOSPITAL_COMMUNITY)
Admission: RE | Admit: 2012-06-12 | Discharge: 2012-06-12 | Disposition: A | Discharge status: Home / Self Care | Payer: Medicare Other | Source: Ambulatory Visit | Attending: Internal Medicine | Admitting: Internal Medicine

## 2012-06-12 VITALS — BP 104/62 | HR 72 | Wt 149.1 lb

## 2012-06-12 DIAGNOSIS — R7309 Other abnormal glucose: Secondary | ICD-10-CM | POA: Insufficient documentation

## 2012-06-12 DIAGNOSIS — R739 Hyperglycemia, unspecified: Secondary | ICD-10-CM

## 2012-06-12 DIAGNOSIS — I5033 Acute on chronic diastolic (congestive) heart failure: Secondary | ICD-10-CM | POA: Insufficient documentation

## 2012-06-12 DIAGNOSIS — L0291 Cutaneous abscess, unspecified: Secondary | ICD-10-CM | POA: Insufficient documentation

## 2012-06-12 DIAGNOSIS — I4891 Unspecified atrial fibrillation: Secondary | ICD-10-CM | POA: Insufficient documentation

## 2012-06-12 DIAGNOSIS — I5032 Chronic diastolic (congestive) heart failure: Secondary | ICD-10-CM

## 2012-06-12 DIAGNOSIS — L0390 Cellulitis, unspecified: Secondary | ICD-10-CM

## 2012-06-12 NOTE — Progress Notes (Signed)
Patient ID: [name] [name], female   DOB: 08-29-1924, 79 y.o.   MRN: [id number] Patient ID: [name] [name], female   DOB: 1924/06/19, 79 y.o.   MRN: [id number] PCP:  Roe Coombs, MD - [facility]   Weight Range   Baseline proBNP     HPI: [name] [name] is a very pleasant 79 y/o woman with h/o HTN, HL, DM2. She was admitted last month after suffering a PEA arrest in [facility].   She was transferred to [facility]. Found to have new onset AF with RVR and severe MR due to annular dilation. She underwent heart cath which showed non-obstructive CAD. Unfortunately this was complicated by a large left groin bleed. She eventually underwent Minimally Invasive Mitral Valve Repair utilizing a 26 mm Sorin Memo 3D ring for annuloplasty and a MAZE Procedure on 05/20/12. Was discharged on 05/27/2012.  Last Thursday they noted her HR was back up. Unable to come to clinic due to weather. Started on metoprolol 12.5 q8. Still taking amiodarone 200 daily.   06/06/12 S/P TEE Successful DC-CV.   She returns for follow up with her son and daughter. Overall she is feeling much better. Family thinks she received extra dose of lasix due to increased lower extremity edema. Denies SOB/PND/Orthopnea. Now able to participate in PT. Able to walk with a cane.  Complaint with medications.    ROS: All systems negative except as listed in HPI, PMH and Problem List.  Past Medical History  Diagnosis Date  . Hypertension   . Hyperlipidemia   . Type 2 diabetes mellitus   . Arthritis   . Coronary artery disease   . Anginal pain   . Shortness of breath   . S/P mitral valve repair 05/20/2012    26mm Sorin Memo 3D ring annuloplasty via right mini thoracotomy approach  . S/P Maze operation for atrial fibrillation 05/20/2012    Complete bi-atrial lesion set using cryothermy    Current Outpatient Prescriptions  Medication Sig Dispense Refill  . amiodarone (PACERONE) 200 MG tablet Take 1 tablet (200 mg total) by mouth 2 (two) times daily.  60  tablet  3  . aspirin EC 81 MG EC tablet Take 1 tablet (81 mg total) by mouth daily.      . cephALEXin (KEFLEX) 500 MG capsule Take 1 capsule (500 mg total) by mouth 2 (two) times daily.  14 capsule  0  . citalopram (CELEXA) 10 MG tablet Take 10 mg by mouth daily.      . furosemide (LASIX) 40 MG tablet Take 1 tablet (40 mg total) by mouth daily. For 7 Days  30 tablet    . glimepiride (AMARYL) 1 MG tablet Take 1 mg by mouth daily before breakfast.      . insulin glargine (LANTUS) 100 UNIT/ML injection Inject 18 Units into the skin at bedtime.      . insulin regular (NOVOLIN R,HUMULIN R) 100 units/mL injection Inject into the skin 3 (three) times daily before meals.      . metFORMIN (GLUCOPHAGE) 500 MG tablet Take 500 mg by mouth 2 (two) times daily with a meal.      . metoprolol tartrate (LOPRESSOR) 25 MG tablet Take 1 tablet (25 mg total) by mouth 2 (two) times daily.  60 tablet  3  . potassium chloride SA (K-DUR,KLOR-CON) 20 MEQ tablet Take 1 tablet (20 mEq total) by mouth daily. For 7 Days      . pravastatin (PRAVACHOL) 40 MG tablet Take 40 mg by mouth  daily.      . rOPINIRole (REQUIP) 0.5 MG tablet Take 0.5 mg by mouth at bedtime.      . traMADol (ULTRAM) 50 MG tablet Take 1-2 tablets (50-100 mg total) by mouth every 4 (four) hours as needed for pain.  30 tablet  0  . traZODone (DESYREL) 50 MG tablet Take 50 mg by mouth at bedtime.      Marland Kitchen warfarin (COUMADIN) 1 MG tablet Take 1 tablet (1 mg total) by mouth daily. Or as directed by Coumadin Clinic  60 tablet  1   No current facility-administered medications for this encounter.   Facility-Administered Medications Ordered in Other Encounters  Medication Dose Route Frequency Provider Last Rate Last Dose  . fentaNYL (SUBLIMAZE) injection    PRN Ellin Goodie, CRNA   50 mcg at 05/15/12 0700  . midazolam (VERSED) 5 MG/5ML injection    PRN Ellin Goodie, CRNA   0.5 mg at 05/15/12 0700     PHYSICAL EXAM: Filed Vitals:   06/12/12 1415  BP:  104/62  Pulse: 72  Weight: 149 lb 1.9 oz (67.64 kg)  SpO2: 94%    General:  Well appearing. No resp difficulty HEENT: normal Neck: supple. JVP flat. Carotids 2+ bilaterally; no bruits. No lymphadenopathy or thryomegaly appreciated. Cor: PMI normal. Regular  No rubs, gallops or murmurs. R sided chest 2 intact scabs noted.  Lungs: clear Abdomen: soft, nontender, nondistended. No hepatosplenomegaly. No bruits or masses. Good bowel sounds. Extremities: no cyanosis, clubbing, rash, edema Neuro: alert & orientedx3, cranial nerves grossly intact. Moves all 4 extremities w/o difficulty. Affect pleasant.      ASSESSMENT & PLAN:

## 2012-06-12 NOTE — Assessment & Plan Note (Signed)
R chest cellulitis resolved. Stop dressing changes. Leave open to air.

## 2012-06-12 NOTE — Assessment & Plan Note (Signed)
Maintaining SR. Continue current regimen. Coumadin managed at SNF.

## 2012-06-12 NOTE — Assessment & Plan Note (Signed)
Volume status stable. Continue current diuretic regimen. Add ted hose for lower extremity edema . Follow up in 6 weeks.

## 2012-06-16 ENCOUNTER — Ambulatory Visit (INDEPENDENT_AMBULATORY_CARE_PROVIDER_SITE_OTHER): Payer: Medicare Other | Admitting: Thoracic Surgery (Cardiothoracic Vascular Surgery)

## 2012-06-16 ENCOUNTER — Encounter: Payer: Self-pay | Admitting: Thoracic Surgery (Cardiothoracic Vascular Surgery)

## 2012-06-16 ENCOUNTER — Ambulatory Visit
Admission: RE | Admit: 2012-06-16 | Discharge: 2012-06-16 | Disposition: A | Discharge status: Home / Self Care | Payer: Medicare Other | Source: Ambulatory Visit | Attending: Thoracic Surgery (Cardiothoracic Vascular Surgery) | Admitting: Thoracic Surgery (Cardiothoracic Vascular Surgery)

## 2012-06-16 ENCOUNTER — Other Ambulatory Visit: Payer: Self-pay | Admitting: *Deleted

## 2012-06-16 VITALS — BP 133/66 | HR 70 | Resp 16 | Ht 63.5 in | Wt 147.0 lb

## 2012-06-16 DIAGNOSIS — Z9889 Other specified postprocedural states: Secondary | ICD-10-CM

## 2012-06-16 DIAGNOSIS — I059 Rheumatic mitral valve disease, unspecified: Secondary | ICD-10-CM

## 2012-06-16 DIAGNOSIS — I4891 Unspecified atrial fibrillation: Secondary | ICD-10-CM

## 2012-06-16 DIAGNOSIS — J90 Pleural effusion, not elsewhere classified: Secondary | ICD-10-CM

## 2012-06-16 DIAGNOSIS — Z8679 Personal history of other diseases of the circulatory system: Secondary | ICD-10-CM

## 2012-06-16 DIAGNOSIS — I340 Nonrheumatic mitral (valve) insufficiency: Secondary | ICD-10-CM

## 2012-06-16 NOTE — Patient Instructions (Addendum)
In 1 week decrease amiodarone to 200 mg once/daily  Stop aspirin while taking coumadin  Begin physical therapy on right arm and shoulder for strengthening and range of motion

## 2012-06-16 NOTE — Progress Notes (Signed)
[address]            [place] [address]          [phone]     CARDIOTHORACIC SURGERY OFFICE NOTE  Referring Provider is Bensimhon, Bevelyn Buckles, MD PCP is Marylen Ponto, MD   HPI:  Patient returns for followup status post minimally invasive mitral valve repair and Maze procedure.  She originally presented in early February with class IV congestive heart failure and atrial fibrillation. She also suffered a brief respiratory arrest at the time of her initial evaluation at [facility] in [place].  Following transfer to [facility] she recovered quickly with medical therapy under the care of Dr. Gala Romney and the heart failure team although she did develop a large left groin hematoma related to a cardiac catheterization associated with acute blood loss anemia that required transfusion. She ultimately underwent surgery on 05/20/2012.  Her early postoperative recovery was uncomplicated and she was discharged on 05/27/2012.  She did go back into atrial fibrillation but she subsequently underwent TEE and cardioversion by Dr. Gala Romney on 06/06/2012. Over the past 3 weeks she has been staying at the [facility] skilled nursing facility where she undergoes daily physical therapy.  Last week apparently her weight was up a few pounds and she had developed some lower extremity edema, several of dose of Lasix was increased to twice daily. She returns for office today for routine followup. She states that she no longer has any significant pain at all in her chest or arm or shoulder. She complains that there not letting her use her right arm or shoulder that it still feels weak. Her appetite is marginal. She is ambulating some she reports that she's made considerable progress but she still feels weak. She knows that her breathing is much better than it was prior to her hospitalization in February. She's not having any resting shortness of breath, PND, palpitations, or dizzy  spells. She has mild exertional shortness of breath but she states that this is much better than it had been in the past. Overall she is satisfied with her progress but she still feels weak.   Current Outpatient Prescriptions  Medication Sig Dispense Refill  . amiodarone (PACERONE) 200 MG tablet Take 1 tablet (200 mg total) by mouth 2 (two) times daily.  60 tablet  3  . aspirin 81 MG tablet Take 81 mg by mouth daily.      . citalopram (CELEXA) 10 MG tablet Take 10 mg by mouth daily.      . furosemide (LASIX) 40 MG tablet Take 40 mg by mouth 2 (two) times daily. For 7 Days      . glimepiride (AMARYL) 1 MG tablet Take 1 mg by mouth daily before breakfast.      . insulin glargine (LANTUS) 100 UNIT/ML injection Inject 18 Units into the skin at bedtime.      . insulin regular (NOVOLIN R,HUMULIN R) 100 units/mL injection Inject into the skin 3 (three) times daily before meals.      . metFORMIN (GLUCOPHAGE) 500 MG tablet Take 500 mg by mouth 2 (two) times daily with a meal.      . metoprolol tartrate (LOPRESSOR) 25 MG tablet Take 1 tablet (25 mg total) by mouth 2 (two) times daily.  60 tablet  3  . potassium chloride SA (K-DUR,KLOR-CON) 20 MEQ tablet Take 40 mEq by mouth 2 (  two) times daily.      . pravastatin (PRAVACHOL) 40 MG tablet Take 40 mg by mouth daily.      Marland Kitchen rOPINIRole (REQUIP) 0.5 MG tablet Take 0.5 mg by mouth at bedtime.      . traMADol (ULTRAM) 50 MG tablet Take 1-2 tablets (50-100 mg total) by mouth every 4 (four) hours as needed for pain.  30 tablet  0  . traZODone (DESYREL) 50 MG tablet Take 50 mg by mouth at bedtime.      Marland Kitchen warfarin (COUMADIN) 1 MG tablet Take 1 tablet (1 mg total) by mouth daily. Or as directed by [facility]  60 tablet  1   No current facility-administered medications for this visit.   Facility-Administered Medications Ordered in Other Visits  Medication Dose Route Frequency Provider Last Rate Last Dose  . fentaNYL (SUBLIMAZE) injection    PRN Ellin Goodie, CRNA   50 mcg at 05/15/12 0700  . midazolam (VERSED) 5 MG/5ML injection    PRN Ellin Goodie, CRNA   0.5 mg at 05/15/12 0700      Physical Exam:   BP 133/66  Pulse 70  Resp 16  Ht 5' 3.5" (1.613 m)  Wt 147 lb (66.679 kg)  BMI 25.63 kg/m2  SpO2 96%  General:  Well-appearing  Chest:   Clear to auscultation with slightly diminished breath sounds right lung base and a few basilar crackles  CV:   Regular rate and rhythm without murmur  Incisions:  Clean and dry and healing nicely  Abdomen:  Soft and nontender  Extremities:  Warm and well-perfused with no lower extremity edema  Diagnostic Tests:  2 channel telemetry rhythm strip demonstrates normal sinus rhythm  *RADIOLOGY REPORT*  Clinical Data: Cough, status post heart surgery.  CHEST - 2 VIEW  Comparison: None.  Findings: Trachea is midline. Heart is at the upper limits of  normal in size to mildly enlarged. There is pleural parenchymal  opacity at the base the right hemithorax with associated volume  loss. No definite pneumothorax. Left lung is clear. Degenerative  changes are seen in the shoulders.  IMPRESSION:  Moderate right pleural effusion with right basilar airspace  disease. Follow-up to clearing is recommended.  Original Report Authenticated By: Leanna Battles, M.D.     Impression:  Overall the patient seems to be making steady progress since hospital discharge. She is maintaining sinus rhythm and at this point she looks to be nearly euvolemic. She still week and her appetite is poor.  Plan:  I've recommended that the patient stop taking aspirin now that she is therapeutic on Coumadin. I've also recommended that after another week her dose of amiodarone because back to 200 mg daily. Hopefully she will continue to maintain sinus rhythm such that amiodarone can be discontinued within 4-6 weeks. I've also recommended that the patient start physical therapy to work on strengthening and range of motion of her  right arm in addition to overall strengthening and increased work on ambulation. We will have the patient return in 2 months for routine followup and rhythm check. We'll obtain a followup chest x-ray at that time.   Salvatore Decent. Cornelius Moras, MD 06/16/2012 1:55 PM

## 2012-07-01 ENCOUNTER — Emergency Department (HOSPITAL_COMMUNITY): Payer: Medicare Other

## 2012-07-01 ENCOUNTER — Encounter (HOSPITAL_COMMUNITY): Payer: Self-pay | Admitting: Emergency Medicine

## 2012-07-01 ENCOUNTER — Emergency Department (HOSPITAL_COMMUNITY)
Admission: EM | Admit: 2012-07-01 | Discharge: 2012-07-01 | Disposition: A | Discharge status: Home / Self Care | Payer: Medicare Other | Attending: Emergency Medicine | Admitting: Emergency Medicine

## 2012-07-01 DIAGNOSIS — I498 Other specified cardiac arrhythmias: Secondary | ICD-10-CM | POA: Insufficient documentation

## 2012-07-01 DIAGNOSIS — I4891 Unspecified atrial fibrillation: Secondary | ICD-10-CM | POA: Insufficient documentation

## 2012-07-01 DIAGNOSIS — Z8679 Personal history of other diseases of the circulatory system: Secondary | ICD-10-CM | POA: Insufficient documentation

## 2012-07-01 DIAGNOSIS — Z794 Long term (current) use of insulin: Secondary | ICD-10-CM | POA: Insufficient documentation

## 2012-07-01 DIAGNOSIS — E119 Type 2 diabetes mellitus without complications: Secondary | ICD-10-CM | POA: Insufficient documentation

## 2012-07-01 DIAGNOSIS — I10 Essential (primary) hypertension: Secondary | ICD-10-CM | POA: Insufficient documentation

## 2012-07-01 DIAGNOSIS — E785 Hyperlipidemia, unspecified: Secondary | ICD-10-CM | POA: Insufficient documentation

## 2012-07-01 DIAGNOSIS — E875 Hyperkalemia: Secondary | ICD-10-CM

## 2012-07-01 DIAGNOSIS — Z8739 Personal history of other diseases of the musculoskeletal system and connective tissue: Secondary | ICD-10-CM | POA: Insufficient documentation

## 2012-07-01 DIAGNOSIS — Z9889 Other specified postprocedural states: Secondary | ICD-10-CM | POA: Insufficient documentation

## 2012-07-01 DIAGNOSIS — Z79899 Other long term (current) drug therapy: Secondary | ICD-10-CM | POA: Insufficient documentation

## 2012-07-01 DIAGNOSIS — I2510 Atherosclerotic heart disease of native coronary artery without angina pectoris: Secondary | ICD-10-CM | POA: Insufficient documentation

## 2012-07-01 DIAGNOSIS — Z7901 Long term (current) use of anticoagulants: Secondary | ICD-10-CM | POA: Insufficient documentation

## 2012-07-01 DIAGNOSIS — R001 Bradycardia, unspecified: Secondary | ICD-10-CM

## 2012-07-01 DIAGNOSIS — R0602 Shortness of breath: Secondary | ICD-10-CM | POA: Insufficient documentation

## 2012-07-01 DIAGNOSIS — R5381 Other malaise: Secondary | ICD-10-CM | POA: Insufficient documentation

## 2012-07-01 HISTORY — DX: Unspecified atrial fibrillation: I48.91

## 2012-07-01 HISTORY — DX: Cardiac arrest, cause unspecified: I46.9

## 2012-07-01 HISTORY — DX: Chronic diastolic (congestive) heart failure: I50.32

## 2012-07-01 LAB — BASIC METABOLIC PANEL
BUN: 29 mg/dL — ABNORMAL HIGH (ref 6–23)
Calcium: 9.4 mg/dL (ref 8.4–10.5)
GFR calc non Af Amer: 54 mL/min — ABNORMAL LOW (ref >90)
Glucose, Bld: 158 mg/dL — ABNORMAL HIGH (ref 70–99)

## 2012-07-01 LAB — CBC
HCT: 33.4 % — ABNORMAL LOW (ref 36.0–46.0)
Hemoglobin: 11.6 g/dL — ABNORMAL LOW (ref 12.0–15.0)
MCH: 31.4 pg (ref 26.0–34.0)
MCHC: 34.7 g/dL (ref 30.0–36.0)
MCV: 90.5 fL (ref 78.0–100.0)

## 2012-07-01 NOTE — ED Notes (Signed)
Pt. Is feeling much better.  Hr at present is 71.    Pt. Denies any chest pain or sob,  Continues to feel  Dizzy and weak.

## 2012-07-01 NOTE — Consult Note (Signed)
Cardiology Consult Note  Patient ID: [name] [name] MRN: [id number], DOB: [birth year] Date of Encounter: 07/01/2012, 3:19 PM Primary Physician: Marylen Ponto, MD Primary Cardiologist: Bensimhon  Chief Complaint: dizzy Reason for Consult: bradycardia  HPI: [name] [name] is an 79 y/o woman with h/o HTN, HL, DM2, diastolic CHF with recent admission in February 2014 for PEA arrest at [facility]. She was transferred to [facility] at that time and found to have new onset AF with RVR and severe MR due to annular dilation. She underwent heart cath which showed non-obstructive CAD. Unfortunately this was complicated by a large left groin bleed. She eventually underwent Minimally Invasive Mitral Valve Repair utilizing a 26 mm Sorin Memo 3D ring for annuloplasty and a MAZE Procedure on 05/20/12 and was was discharged on 05/27/2012. She was placed on amiodarone and Coumadin. She underwent successful TEE/DCCV 06/06/12 at which time it was noted that amiodarone was continued at higher dose due to large LA.  She has been doing well since that time. She occasionally has SOB with exertion, but in general has been progressing. She was working with PT at home this morning when she began to feel dizzy & dizzy with ambulation. Her HR was checked and was 38. Her daughter believes her BP was somewhere around ~160/45. They contacted her primary care doctor's office and were instructed to hold metoprolol/potassium and go to the ER for evaluation. She denies any CP/SOB with this episode, presyncope, syncope or palpitations. She denies orthopnea or recent weight change. In the ER, HR was initially 39 ?ectopic atrial bradycardia with ST-T abnormalities. Without intervention, HR came up into the 70s, NSR with 1st degree AVB. Symptoms has resolved and she currently feels at baseline. Labs significant for K 5.3, BUN 29/Cr 0.93, troponin neg x 1, Hgb 11.6. CXR shows residual R lung atx versus airspace disease, slight interval  improvement.   Past Medical History  Diagnosis Date  . Hypertension   . Hyperlipidemia   . Type 2 diabetes mellitus   . Arthritis   . Coronary artery disease     a. Mild nonobstructive by cath 04/2012, cath complicated by groin hematoma.  . S/P mitral valve repair 05/20/2012    a. Severe MR - 04/2012: 26mm Sorin Memo 3D ring annuloplasty via right mini thoracotomy approach  . S/P Maze operation for atrial fibrillation 05/20/2012    a. 04/2012: Complete bi-atrial lesion set using cryothermy  . Atrial fibrillation     a. Diagnosed 04/2012 in the setting of severe MR. b. s/p TEE/successful DCCV 06/06/12.  Marland Kitchen PEA (Pulseless electrical activity)     a. 04/2012 -> adm with afib, developed PEA, transferred to [facility] for prolonged hospitalization.   . Chronic diastolic CHF (congestive heart failure)      Most Recent Cardiac Studies: Cardiac Cath 04/2012 Procedure Performed:  1. Left Heart Catheterization 2. Selective Coronary Angiography 3. Right Heart Catheterization 4. Left ventricular angiogram 5. Distal aortogram Operator: Verne Carrow, MD  Indication: 79 yo female admitted with CHF, found to have severe MR by TEE with enlarged left atrium, also atrial fibrillation. Plans for possible valve repair. Right and left heart cath today in planning for surgery.  Procedure Details:  The risks, benefits, complications, treatment options, and expected outcomes were discussed with the patient. The patient and/or family concurred with the proposed plan, giving informed consent. The patient was brought to the cath lab after IV hydration was begun and oral premedication was given. The patient was further sedated with Versed.  The leftt groin was prepped and draped in the usual manner. Using the modified Seldinger access technique, a 5 French sheath was placed in the left femoral artery. A 6 French sheath was inserted into the left femoral vein. A multi-purpose catheter was used to perform a right heart  catheterization. Standard diagnostic catheters were used to perform selective coronary angiography. A pigtail catheter was used to perform a left ventricular angiogram as well a distal aortogram. There were no immediate complications. The patient was taken to the recovery area in stable condition.  Hemodynamic Findings:  Ao: 128/63  LV: 123/7/9  RA: 5  RV: 36/4/9  PA: 32/16 (mean 24)  PCWP: 11  Fick Cardiac Output: 2.96 L/min  Fick Cardiac Index: 1.75 L/min/m2  Central Aortic Saturation: 99%  Pulmonary Artery Saturation: 58%  Angiographic Findings:  Left main: No obstructive disease noted.  Left Anterior Descending Artery: Large caliber vessel that courses to the apex. There is a moderate sized diagonal branch. There is mild plaque in the mid and distal LAD.  Circumflex Artery: Large caliber vessel with three small caliber obtuse marginal branches. There is mild plaque in the proximal circumflex. There is mild plaque in the third obtuse marginal branch.  Right Coronary Artery: Large, dominant vessel with 20% proximal stenosis, mild mid plaque. The PDA is moderate sized with no obstructive disease. The Posterolateral branch has mild plaque disease.  Left Ventricular Angiogram: LVEF=65%. Moderate to severe MR by LVgram.  Distal aortogram: Mild plaque. No aneurysm noted. Mild plaque noted in iliac system.  Impression:  1. Mild non-obstructive CAD  2. Normal LV systolic function  3. Moderate to severe MR by LV gram.  Recommendations: Will continue planning for possible mitral valve repair and MAZE procedure. Will resume heparin drip in 8 hours.  Complications: None; patient tolerated the procedure well.  TEE 06/06/12  FINDINGS:  LEFT VENTRICLE: EF = 65-70% + LVH. No wall motion abnormalities.  RIGHT VENTRICLE: Normal  LEFT ATRIUM: Moderately to severely dilated. ~5.4cm  LEFT ATRIAL APPENDAGE: No clot  RIGHT ATRIUM: Moderately to severely dialted  AORTIC VALVE: Trileaflet. Mildly thick.  Mild AI  MITRAL VALVE: + ring. Mild MR  TRICUSPID VALVE: Normal. Moderate MR.  PULMONIC VALVE: Normal trivial TR  INTERATRIAL SEPTUM: not well visualized  PERICARDIUM: no efusion  DESCENDING AORTA: not visualized  CARDIOVERSION:  Indications: Atrial Fibrillation  Procedure Details:  Once the TEE was complete, the patient had the defibrillator pads placed in the anterior and posterior position. The patient then underwent further sedation with propofol 30mg  by the anesthesia service for cardioversion. Once an appropriate level of sedation was achieved, the patient received a single biphasic, synchronized 150J shock with prompt conversion to sinus rhythm. No apparent complications.  COMPLICATIONS:  There were no immediate complications.  CONCLUSION:  6. Successful TEE guided cardioversion. Continue amiodarone at increased dose for now in light of significant LA dilation.    Surgical History:  Past Surgical History  Procedure Laterality Date  . Abdominal hysterectomy    . Cholecystectomy    . Eye surgery    . Appendectomy    . Tee without cardioversion N/A 05/09/2012    Procedure: TRANSESOPHAGEAL ECHOCARDIOGRAM (TEE);  Surgeon: Dolores Patty, MD;  Location: [facility] ENDOSCOPY;  Service: Cardiovascular;  Laterality: N/A;  . Mitral valve repair Right 05/20/2012    Procedure: MINIMALLY INVASIVE MITRAL VALVE REPAIR (MVR);  Surgeon: Purcell Nails, MD;  Location: [facility] OR;  Service: Open Heart Surgery;  Laterality: Right;  . Minimally  invasive maze procedure N/A 05/20/2012    Procedure: MINIMALLY INVASIVE MAZE PROCEDURE;  Surgeon: Purcell Nails, MD;  Location: [facility] OR;  Service: Open Heart Surgery;  Laterality: N/A;  . Intraoperative transesophageal echocardiogram N/A 05/20/2012    Procedure: INTRAOPERATIVE TRANSESOPHAGEAL ECHOCARDIOGRAM;  Surgeon: Purcell Nails, MD;  Location: [facility] OR;  Service: Open Heart Surgery;  Laterality: N/A;  . Tee without cardioversion N/A 06/06/2012    Procedure:  TRANSESOPHAGEAL ECHOCARDIOGRAM (TEE);  Surgeon: Dolores Patty, MD;  Location: [facility] ENDOSCOPY;  Service: Cardiovascular;  Laterality: N/A;  . Cardioversion N/A 06/06/2012    Procedure: CARDIOVERSION;  Surgeon: Dolores Patty, MD;  Location: [facility] ENDOSCOPY;  Service: Cardiovascular;  Laterality: N/A;     Home Meds: Prior to Admission medications   Medication Sig Start Date End Date Taking? Authorizing Provider  amiodarone (PACERONE) 200 MG tablet Take 1 tablet (200 mg total) by mouth 2 (two) times daily. 06/02/12  Yes Dolores Patty, MD  citalopram (CELEXA) 10 MG tablet Take 10 mg by mouth daily.   Yes Historical Provider, MD  furosemide (LASIX) 40 MG tablet Take 40 mg by mouth 2 (two) times daily.  05/23/12  Yes Erin Barrett, PA-C  glimepiride (AMARYL) 1 MG tablet Take 1 mg by mouth daily before breakfast.   Yes Historical Provider, MD  insulin glargine (LANTUS) 100 UNIT/ML injection Inject 18 Units into the skin at bedtime.   Yes Historical Provider, MD  magnesium oxide (MAG-OX) 400 MG tablet Take 400 mg by mouth 2 (two) times daily.   Yes Historical Provider, MD  metFORMIN (GLUCOPHAGE) 500 MG tablet Take 500 mg by mouth 2 (two) times daily with a meal.   Yes Historical Provider, MD  metoprolol tartrate (LOPRESSOR) 25 MG tablet Take 1 tablet (25 mg total) by mouth 2 (two) times daily. 06/02/12  Yes Bevelyn Buckles Bensimhon, MD  potassium chloride SA (K-DUR,KLOR-CON) 20 MEQ tablet Take 40 mEq by mouth 2 (two) times daily. 05/23/12  Yes Erin Barrett, PA-C  pravastatin (PRAVACHOL) 40 MG tablet Take 40 mg by mouth daily.   Yes Historical Provider, MD  rOPINIRole (REQUIP) 0.5 MG tablet Take 0.5 mg by mouth at bedtime.   Yes Historical Provider, MD  traMADol (ULTRAM) 50 MG tablet Take 1-2 tablets (50-100 mg total) by mouth every 4 (four) hours as needed for pain. 05/27/12  Yes Donielle Margaretann Loveless, PA-C  traZODone (DESYREL) 50 MG tablet Take 50 mg by mouth at bedtime.   Yes Historical Provider, MD    warfarin (COUMADIN) 1 MG tablet Take 1 mg by mouth daily at 6 PM. Or as directed by [facility] Clinic 05/27/12  Yes Erin Barrett, PA-C    Allergies:  Allergies  Allergen Reactions  . Codeine Nausea And Vomiting    History   Social History  . Marital Status: Widowed    Spouse Name: N/A    Number of Children: N/A  . Years of Education: N/A   Occupational History  . Not on file.   Social History Main Topics  . Smoking status: Never Smoker   . Smokeless tobacco: Never Used  . Alcohol Use: No  . Drug Use: No  . Sexually Active: No   Other Topics Concern  . Not on file   Social History Narrative  . No narrative on file     Family History: no known h/o premature cad  Review of Systems: General: negative for chills, fever  Cardiovascular: see above Dermatological: negative for rash Respiratory: negative for cough or  wheezing Urologic: negative for hematuria Abdominal: negative for nausea, vomiting, diarrhea, bright red blood per rectum, melena, or hematemesis Neurologic: negative for visual changes, syncope, or dizziness All other systems reviewed and are otherwise negative except as noted above.  Labs:   Lab Results  Component Value Date   WBC 9.1 07/01/2012   HGB 11.6* 07/01/2012   HCT 33.4* 07/01/2012   MCV 90.5 07/01/2012   PLT 275 07/01/2012    Recent Labs Lab 07/01/12 1338  NA 137  K 5.3*  CL 102  CO2 24  BUN 29*  CREATININE 0.93  CALCIUM 9.4  GLUCOSE 158*    Radiology/Studies:  Dg Chest 2 View 06/16/2012  *RADIOLOGY REPORT*  Clinical Data: Cough, status post heart surgery.  CHEST - 2 VIEW  Comparison: None.  Findings: Trachea is midline.  Heart is at the upper limits of normal in size to mildly enlarged.  There is pleural parenchymal opacity at the base the right hemithorax with associated volume loss.  No definite pneumothorax.  Left lung is clear. Degenerative changes are seen in the shoulders.  IMPRESSION: Moderate right pleural effusion with right basilar  airspace disease.  Follow-up to clearing is recommended.   Original Report Authenticated By: Leanna Battles, M.D.     EKG:  1) ectopic atrial bradycardia 39bpm nonspecific ST-T changes 2) NSR 70bpm 1st degree AVB, not significantly changed from 05/2012  Physical Exam: Blood pressure 114/43, pulse 69, temperature 98 F (36.7 C), temperature source Oral, resp. rate 22, SpO2 99.00%. General: Well developed, well nourished WF in no acute distress. Head: Normocephalic, atraumatic, sclera non-icteric, no xanthomas, nares are without discharge.  Neck: Negative for carotid bruits. JVD not elevated. Lungs: Decreased BS R base but no wheezes, rales, or rhonchi. Breathing is unlabored. Heart: RRR with S1 S2. No murmurs, rubs, or gallops appreciated. Abdomen: Soft, non-tender, non-distended with normoactive bowel sounds. No hepatomegaly. No rebound/guarding. No obvious abdominal masses. Msk:  Strength and tone appear normal for age. Extremities: No clubbing or cyanosis. No edema.  Distal pedal pulses are 2+ and equal bilaterally. Neuro: Alert and oriented X 3. No focal deficit. No facial asymmetry. Moves all extremities spontaneously. Psych:  Responds to questions appropriately with a normal affect.    ASSESSMENT AND PLAN:   1. Symptomatic bradycardia 2. Atrial fibrillation 04/2012 s/p TEE/DCCV 05/2012 3. HTN 4. Recent MVR 04/2012 (admission initially for PEA at that time) 5. Diabetes mellitus 6. Mild hyperkalemia, no mention of hemolysis 7. Chronic anticoagulation, INR followed by PCP, was in goal last Wednesday per family  Per discussion with Dr. Gala Romney, discontinue Lopressor and Potassium. Decrease amiodarone to 200mg  daily. F/u in CHF clinic within 1-2 weeks - the office will call her for an appointment (left msg on scheduling line). F/u INR with PCP as planned. These instructions were given in printed form to the patient. OK for DC from ER.  Signed, Ronie Spies PA-C 07/01/2012, 3:19  PM   Patient seen and examined with Ronie Spies, PA-C. We discussed all aspects of the encounter. I agree with the assessment and plan as stated above. Ms. Diggs presents with symptomatic bradycardia in setting of recent DC-CV of AF. She also has mild hyperkalemia. HR now improved in 70s and she feels much better. Did very well with rehab this am. Will stop lopressor and kcl. Decrease amio to 200 daily. I feel she is stable for d/c home. We will follow closely in the HF clinic. I discussed with patient, her daughter and Dr. Patria Mane in  ER.  Truman Hayward 5:14 PM

## 2012-07-01 NOTE — ED Notes (Signed)
Pt sent here c/o bradycardia during PT today; pt c/o dizziness and HR noted to be 38-40bpm

## 2012-07-01 NOTE — ED Provider Notes (Signed)
History     CSN: [id number]  Arrival date & time 07/01/12  1242   First MD Initiated Contact with Patient 07/01/12 1331      Chief Complaint  Patient presents with  . Bradycardia    (Consider location/radiation/quality/duration/timing/severity/associated sxs/prior treatment) HPI Comments: Patient with history of PEA arrest 04/2012, subsequent MAZE procedure and cardioversion for atrial fibrillation -- presents with complaint of bradycardia. Patient's daughter noted that patient's heart rate was normal this morning. Patient then went to physical therapy and at the end of the session her heart was found to be in the upper 30s to low 40s. Patient states that she walked well today. She was not overly short of breath. Her only current symptom is fatigue, mild shortness of breath. No fever, chest pain, abdominal pain, urinary symptoms, lower extremity edema. Patient is currently taking metoprolol and amiodarone. Someone at physical therapy called her doctor and patient was told to not take metoprolol however she did have her morning dose. Onset of symptoms acute. Course is constant. Nothing makes symptoms better or worse.  The history is provided by the patient, medical records and a relative.    Past Medical History  Diagnosis Date  . Hypertension   . Hyperlipidemia   . Type 2 diabetes mellitus   . Arthritis   . Coronary artery disease   . Anginal pain   . Shortness of breath   . S/P mitral valve repair 05/20/2012    26mm Sorin Memo 3D ring annuloplasty via right mini thoracotomy approach  . S/P Maze operation for atrial fibrillation 05/20/2012    Complete bi-atrial lesion set using cryothermy    Past Surgical History  Procedure Laterality Date  . Abdominal hysterectomy    . Cholecystectomy    . Eye surgery    . Appendectomy    . Tee without cardioversion N/A 05/09/2012    Procedure: TRANSESOPHAGEAL ECHOCARDIOGRAM (TEE);  Surgeon: Dolores Patty, MD;  Location: [facility] ENDOSCOPY;   Service: Cardiovascular;  Laterality: N/A;  . Mitral valve repair Right 05/20/2012    Procedure: MINIMALLY INVASIVE MITRAL VALVE REPAIR (MVR);  Surgeon: Purcell Nails, MD;  Location: [facility] OR;  Service: Open Heart Surgery;  Laterality: Right;  . Minimally invasive maze procedure N/A 05/20/2012    Procedure: MINIMALLY INVASIVE MAZE PROCEDURE;  Surgeon: Purcell Nails, MD;  Location: [facility] OR;  Service: Open Heart Surgery;  Laterality: N/A;  . Intraoperative transesophageal echocardiogram N/A 05/20/2012    Procedure: INTRAOPERATIVE TRANSESOPHAGEAL ECHOCARDIOGRAM;  Surgeon: Purcell Nails, MD;  Location: [facility] OR;  Service: Open Heart Surgery;  Laterality: N/A;  . Tee without cardioversion N/A 06/06/2012    Procedure: TRANSESOPHAGEAL ECHOCARDIOGRAM (TEE);  Surgeon: Dolores Patty, MD;  Location: [facility] ENDOSCOPY;  Service: Cardiovascular;  Laterality: N/A;  . Cardioversion N/A 06/06/2012    Procedure: CARDIOVERSION;  Surgeon: Dolores Patty, MD;  Location: [facility] ENDOSCOPY;  Service: Cardiovascular;  Laterality: N/A;    History reviewed. No pertinent family history.  History  Substance Use Topics  . Smoking status: Never Smoker   . Smokeless tobacco: Never Used  . Alcohol Use: No    OB History   Grav Para Term Preterm Abortions TAB SAB Ect Mult Living                  Review of Systems  Constitutional: Positive for fatigue. Negative for fever.  HENT: Negative for sore throat and rhinorrhea.   Eyes: Negative for redness.  Respiratory: Positive for shortness of breath (mild).  Negative for cough.   Cardiovascular: Negative for chest pain and leg swelling.  Gastrointestinal: Negative for nausea, vomiting, abdominal pain and diarrhea.  Genitourinary: Negative for dysuria.  Musculoskeletal: Negative for myalgias.  Skin: Negative for rash.  Neurological: Negative for headaches.    Allergies  Codeine  Home Medications   Current Outpatient Rx  Name  Route  Sig  Dispense  Refill  .  amiodarone (PACERONE) 200 MG tablet   Oral   Take 1 tablet (200 mg total) by mouth 2 (two) times daily.   60 tablet   3   . citalopram (CELEXA) 10 MG tablet   Oral   Take 10 mg by mouth daily.         . furosemide (LASIX) 40 MG tablet   Oral   Take 40 mg by mouth 2 (two) times daily.          Marland Kitchen glimepiride (AMARYL) 1 MG tablet   Oral   Take 1 mg by mouth daily before breakfast.         . insulin glargine (LANTUS) 100 UNIT/ML injection   Subcutaneous   Inject 18 Units into the skin at bedtime.         . magnesium oxide (MAG-OX) 400 MG tablet   Oral   Take 400 mg by mouth 2 (two) times daily.         . metFORMIN (GLUCOPHAGE) 500 MG tablet   Oral   Take 500 mg by mouth 2 (two) times daily with a meal.         . metoprolol tartrate (LOPRESSOR) 25 MG tablet   Oral   Take 1 tablet (25 mg total) by mouth 2 (two) times daily.   60 tablet   3   . potassium chloride SA (K-DUR,KLOR-CON) 20 MEQ tablet   Oral   Take 40 mEq by mouth 2 (two) times daily.         . pravastatin (PRAVACHOL) 40 MG tablet   Oral   Take 40 mg by mouth daily.         Marland Kitchen rOPINIRole (REQUIP) 0.5 MG tablet   Oral   Take 0.5 mg by mouth at bedtime.         . traMADol (ULTRAM) 50 MG tablet   Oral   Take 1-2 tablets (50-100 mg total) by mouth every 4 (four) hours as needed for pain.   30 tablet   0   . traZODone (DESYREL) 50 MG tablet   Oral   Take 50 mg by mouth at bedtime.         Marland Kitchen warfarin (COUMADIN) 1 MG tablet   Oral   Take 1 mg by mouth daily at 6 PM. Or as directed by [facility]mad[facility] Clinic           BP 136/41  Pulse 38  Temp(Src) 98 F (36.7 C) (Oral)  Resp 18  SpO2 94%  Physical Exam  Nursing note and vitals reviewed. Constitutional: She appears well-developed and well-nourished.  HENT:  Head: Normocephalic and atraumatic.  Eyes: Conjunctivae are normal. Right eye exhibits no discharge. Left eye exhibits no discharge.  Neck: Normal range of motion. Neck supple.   Cardiovascular: Regular rhythm and normal heart sounds.  Bradycardia present.   Pulses:      Radial pulses are 2+ on the right side, and 2+ on the left side.  Pulmonary/Chest: Effort normal and breath sounds normal. No respiratory distress.  Abdominal: Soft. There is no tenderness.  Neurological: She  is alert.  Skin: Skin is warm and dry.  Psychiatric: She has a normal mood and affect.    ED Course  Procedures (including critical care time)  Labs Reviewed  CBC - Abnormal; Notable for the following:    RBC 3.69 (*)    Hemoglobin 11.6 (*)    HCT 33.4 (*)    RDW 15.7 (*)    All other components within normal limits  BASIC METABOLIC PANEL - Abnormal; Notable for the following:    Potassium 5.3 (*)    Glucose, Bld 158 (*)    BUN 29 (*)    GFR calc non Af Amer 54 (*)    GFR calc Af Amer 63 (*)    All other components within normal limits  POCT I-STAT TROPONIN I   No results found.   1. Bradycardia     1:38 PM Patient seen and examined. Work-up initiated.  Vital signs reviewed and are as follows: Filed Vitals:   07/01/12 1248  BP: 136/41  Pulse: 38  Temp: 98 F (36.7 C)  Resp: 18    Date: 07/01/2012  Rate: 39  Rhythm: sinus bradycardia  QRS Axis: normal  Intervals: normal  ST/T Wave abnormalities: nonspecific T wave changes  Conduction Disutrbances:first-degree A-V block   Narrative Interpretation:   Old EKG Reviewed: changes noted from 06/07/2012, new bradycardia  2:51 PM HR to low 70's. Patient notes improvement in fatigue and states she feels like she is breathing easier.    Date: 07/01/2012  Rate: 72  Rhythm: normal sinus rhythm  QRS Axis: normal  Intervals: normal  ST/T Wave abnormalities: nonspecific T wave changes  Conduction Disutrbances:first-degree A-V block   Narrative Interpretation:   Old EKG Reviewed: changes noted from earlier EKG, resolution of bradycardia  3:22 PM Spoke with Trish of [place], requested consult.   Pending cards eval.    Handoff to Dr. Patria Mane at shift change.    MDM  Bradycardia with fatigue, subjective SOB. HR improved without intervention in ED and symptoms improved.   Awaiting cards eval and reccs.         Renne Crigler, PA-C 07/01/12 1523

## 2012-07-01 NOTE — ED Provider Notes (Addendum)
  Medical screening examination/treatment/procedure(s) were performed by non-physician practitioner and as supervising physician I was immediately available for consultation/collaboration.  On my exam the patient was in no distress.  She related a history of recent fatigue, but generally has been in her usual state of health.  I saw the ECG (if appropriate), relevant labs and studies - I agree with the interpretation.  Patient was admitted for further evaluation and management    Gerhard Munch, MD 07/01/12 1534  Dr. Milas Kocher has seen the patient and advises that D/C is appropriate.  Meds changed per his suggestion.  Gerhard Munch, MD 07/01/12 [phone]

## 2012-07-08 ENCOUNTER — Ambulatory Visit (HOSPITAL_COMMUNITY)
Admission: RE | Admit: 2012-07-08 | Discharge: 2012-07-08 | Disposition: A | Discharge status: Home / Self Care | Payer: Medicare Other | Source: Ambulatory Visit | Attending: Internal Medicine | Admitting: Internal Medicine

## 2012-07-08 ENCOUNTER — Telehealth (HOSPITAL_COMMUNITY): Payer: Self-pay | Admitting: Adult Health

## 2012-07-08 ENCOUNTER — Encounter (HOSPITAL_COMMUNITY): Payer: Self-pay

## 2012-07-08 VITALS — BP 142/66 | HR 84 | Wt 148.8 lb

## 2012-07-08 DIAGNOSIS — I5032 Chronic diastolic (congestive) heart failure: Secondary | ICD-10-CM | POA: Insufficient documentation

## 2012-07-08 DIAGNOSIS — E785 Hyperlipidemia, unspecified: Secondary | ICD-10-CM | POA: Insufficient documentation

## 2012-07-08 DIAGNOSIS — I4891 Unspecified atrial fibrillation: Secondary | ICD-10-CM | POA: Insufficient documentation

## 2012-07-08 DIAGNOSIS — E119 Type 2 diabetes mellitus without complications: Secondary | ICD-10-CM | POA: Insufficient documentation

## 2012-07-08 DIAGNOSIS — M129 Arthropathy, unspecified: Secondary | ICD-10-CM | POA: Insufficient documentation

## 2012-07-08 DIAGNOSIS — Z8674 Personal history of sudden cardiac arrest: Secondary | ICD-10-CM | POA: Insufficient documentation

## 2012-07-08 DIAGNOSIS — I509 Heart failure, unspecified: Secondary | ICD-10-CM | POA: Insufficient documentation

## 2012-07-08 DIAGNOSIS — I10 Essential (primary) hypertension: Secondary | ICD-10-CM | POA: Insufficient documentation

## 2012-07-08 DIAGNOSIS — I2510 Atherosclerotic heart disease of native coronary artery without angina pectoris: Secondary | ICD-10-CM | POA: Insufficient documentation

## 2012-07-08 LAB — BASIC METABOLIC PANEL
BUN: 26 mg/dL — ABNORMAL HIGH (ref 6–23)
CO2: 28 mEq/L (ref 19–32)
Chloride: 95 mEq/L — ABNORMAL LOW (ref 96–112)
Creatinine, Ser: 0.85 mg/dL (ref 0.50–1.10)

## 2012-07-08 NOTE — Assessment & Plan Note (Addendum)
Rate controlled. Continue current regimen. Check BMET

## 2012-07-08 NOTE — Telephone Encounter (Signed)
Attempted leave a message about lab work. Potassium stable.   CLEGG,AMY 12:37 PM

## 2012-07-08 NOTE — Progress Notes (Signed)
Patient ID: [name] [name], female   DOB: Jul 28, 1924, 79 y.o.   MRN: [id number] PCP:  [name] Coombs, MD - [name] [name] FP   Weight Range   Baseline proBNP     HPI: [name] [name] is a very pleasant 79 y/o woman with h/o HTN, HL, DM2, and  PEA arrest (05/05/12).    She was transferred to [facility]. Found to have new onset AF with RVR and severe MR due to annular dilation. She underwent heart cath which showed non-obstructive CAD. Unfortunately this was complicated by a large left groin bleed. She eventually underwent Minimally Invasive Mitral Valve Repair utilizing a 26 mm Sorin Memo 3D ring for annuloplasty and a MAZE Procedure on 05/20/12. Was discharged on 05/27/2012.  Last Thursday they noted her HR was back up. Unable to come to clinic due to weather. Started on metoprolol 12.5 q8. Still taking amiodarone 200 daily.   06/06/12 S/P TEE Successful DC-CV.   [name] [name] in [facility] ED 07/01/12 and she was bradycardic. Lopressor and Potassium stopped. (K 5.3) Amiodarone decreased to 200 mg daily.   She returns for follow up. She was discharged from SNF 2 weeks ago. (Discharge from SNF 147 pounds).Followed by [facility] OT/PT. Denies SOB/PND/Orthopnea. Complains of weakness. Unable to weigh at home. Requires assistance getting dressed and performing ADLs. Ambulates with walker or cane. Appetite ok. Compliant with medications. Daughter living with her for another week. Uses Ms [name] [name].       ROS: All systems negative except as listed in HPI, PMH and Problem List.  Past Medical History  Diagnosis Date  . Hypertension   . Hyperlipidemia   . Type 2 diabetes mellitus   . Arthritis   . Coronary artery disease     a. Mild nonobstructive by cath 04/2012, cath complicated by groin hematoma.  . S/P mitral valve repair 05/20/2012    a. Severe MR - 04/2012: 26mm Sorin Memo 3D ring annuloplasty via right mini thoracotomy approach  . S/P Maze operation for atrial fibrillation 05/20/2012    a. 04/2012: Complete bi-atrial lesion  set using cryothermy  . Atrial fibrillation     a. Diagnosed 04/2012 in the setting of severe MR. b. s/p TEE/successful DCCV 06/06/12.  Marland Kitchen PEA (Pulseless electrical activity)     a. 04/2012 -> adm with afib, developed PEA, transferred to [facility] for prolonged hospitalization.   . Chronic diastolic CHF (congestive heart failure)     Current Outpatient Prescriptions  Medication Sig Dispense Refill  . amiodarone (PACERONE) 200 MG tablet Take 200 mg by mouth daily.      . citalopram (CELEXA) 10 MG tablet Take 10 mg by mouth daily.      . furosemide (LASIX) 40 MG tablet Take 40 mg by mouth 2 (two) times daily.       Marland Kitchen glimepiride (AMARYL) 1 MG tablet Take 1 mg by mouth daily before breakfast.      . insulin glargine (LANTUS) 100 UNIT/ML injection Inject 18 Units into the skin at bedtime.      . magnesium oxide (MAG-OX) 400 MG tablet Take 400 mg by mouth 2 (two) times daily.      . metFORMIN (GLUCOPHAGE) 500 MG tablet Take 500 mg by mouth 2 (two) times daily with a meal.      . pravastatin (PRAVACHOL) 40 MG tablet Take 40 mg by mouth daily.      Marland Kitchen rOPINIRole (REQUIP) 0.5 MG tablet Take 0.5 mg by mouth at bedtime.      . traMADol (  ULTRAM) 50 MG tablet Take 1-2 tablets (50-100 mg total) by mouth every 4 (four) hours as needed for pain.  30 tablet  0  . traZODone (DESYREL) 50 MG tablet Take 50 mg by mouth at bedtime.      Marland Kitchen warfarin (COUMADIN) 1 MG tablet Take 1 mg by mouth daily at 6 PM. Or as directed by Coumadin Clinic       No current facility-administered medications for this encounter.   Facility-Administered Medications Ordered in Other Encounters  Medication Dose Route Frequency Provider Last Rate Last Dose  . fentaNYL (SUBLIMAZE) injection    PRN Ellin Goodie, CRNA   50 mcg at 05/15/12 0700  . midazolam (VERSED) 5 MG/5ML injection    PRN Ellin Goodie, CRNA   0.5 mg at 05/15/12 0700     PHYSICAL EXAM: Filed Vitals:   07/08/12 1101  BP: 142/66  Pulse: 84  Weight: 148 lb 12.8 oz (67.495  kg)  SpO2: 97%    General:  Elderly chronically ill appearing. No resp difficulty HEENT: normal Neck: supple. JVP flat. Carotids 2+ bilaterally; no bruits. No lymphadenopathy or thryomegaly appreciated. Cor: PMI normal. Regular  No rubs, gallops or murmurs. Lungs: clear Abdomen: soft, nontender, nondistended. No hepatosplenomegaly. No bruits or masses. Good bowel sounds. Extremities: no cyanosis, clubbing, rash, edema Neuro: alert & orientedx3, cranial nerves grossly intact. Moves all 4 extremities w/o difficulty. Affect pleasant.      ASSESSMENT & PLAN:

## 2012-07-08 NOTE — Assessment & Plan Note (Signed)
Volume status stable. Continue current diuretic regimen. Instructed to hold lasix if her weight is 143 pounds. Instructed to take an extra 40 mg of lasix if her weight is 150 pounds or greater. Check BMET today. Follow up in 3 weeks.

## 2012-07-08 NOTE — Patient Instructions (Addendum)
Follow up May 1- Already scheduled.   Hold lasix if your weight is < 143 pounds.   If your weight 150 pounds take an extra 40 mg of lasix   Do the following things EVERYDAY: 1) Weigh yourself in the morning before breakfast. Write it down and keep it in a log. 2) Take your medicines as prescribed 3) Eat low salt foods-Limit salt (sodium) to 2000 mg per day.  4) Stay as active as you can everyday 5) Limit all fluids for the day to less than 2 liters

## 2012-07-24 ENCOUNTER — Ambulatory Visit (HOSPITAL_COMMUNITY)
Admission: RE | Admit: 2012-07-24 | Discharge: 2012-07-24 | Disposition: A | Discharge status: Home / Self Care | Payer: Medicare Other | Source: Ambulatory Visit | Attending: Internal Medicine | Admitting: Internal Medicine

## 2012-07-24 ENCOUNTER — Encounter (HOSPITAL_COMMUNITY): Payer: Self-pay

## 2012-07-24 VITALS — BP 144/60 | HR 90 | Wt 146.8 lb

## 2012-07-24 DIAGNOSIS — F329 Major depressive disorder, single episode, unspecified: Secondary | ICD-10-CM

## 2012-07-24 DIAGNOSIS — I509 Heart failure, unspecified: Secondary | ICD-10-CM | POA: Insufficient documentation

## 2012-07-24 DIAGNOSIS — I4891 Unspecified atrial fibrillation: Secondary | ICD-10-CM

## 2012-07-24 DIAGNOSIS — I5032 Chronic diastolic (congestive) heart failure: Secondary | ICD-10-CM

## 2012-07-24 DIAGNOSIS — F3289 Other specified depressive episodes: Secondary | ICD-10-CM

## 2012-07-24 NOTE — Patient Instructions (Addendum)
Please wear compression stocking daily   Take Celexa 20 mg daily   Follow up in 4-6 weeks  Do the following things EVERYDAY: 1) Weigh yourself in the morning before breakfast. Write it down and keep it in a log. 2) Take your medicines as prescribed 3) Eat low salt foods-Limit salt (sodium) to 2000 mg per day.  4) Stay as active as you can everyday 5) Limit all fluids for the day to less than 2 liters

## 2012-07-24 NOTE — Assessment & Plan Note (Addendum)
Continues to get stronger. Volume status stable. Continue current diuretic regimen. Add compression stockings.She continues to work with home PT. Consider cardiac rehab when finished with PT. Follow up in 3 months.

## 2012-07-25 ENCOUNTER — Telehealth (HOSPITAL_COMMUNITY): Payer: Self-pay | Admitting: Cardiology

## 2012-07-25 DIAGNOSIS — I5022 Chronic systolic (congestive) heart failure: Secondary | ICD-10-CM

## 2012-07-25 MED ORDER — PRAVASTATIN SODIUM 40 MG PO TABS: 40.0000 mg | ORAL_TABLET | Freq: Every day | ORAL | Status: DC

## 2012-07-25 MED ORDER — MAGNESIUM OXIDE 400 MG PO TABS: 400.0000 mg | ORAL_TABLET | Freq: Two times a day (BID) | ORAL | Status: DC

## 2012-07-25 MED ORDER — AMIODARONE HCL 200 MG PO TABS: 200.0000 mg | ORAL_TABLET | Freq: Every day | ORAL | Status: DC

## 2012-07-25 MED ORDER — FUROSEMIDE 40 MG PO TABS: 40.0000 mg | ORAL_TABLET | Freq: Two times a day (BID) | ORAL | Status: DC

## 2012-07-25 NOTE — Telephone Encounter (Signed)
Pt called to request that all meds be sent to the [facility] in [place]. Current rx's have rehab physicians name and they did not want any problems getting rx's in the future

## 2012-07-26 DIAGNOSIS — F329 Major depressive disorder, single episode, unspecified: Secondary | ICD-10-CM | POA: Insufficient documentation

## 2012-07-26 NOTE — Progress Notes (Signed)
Patient ID: [name] [name], female   DOB: Aug 22, 1924, 79 y.o.   MRN: [id number] PCP:  Roe Coombs, MD - Cheryln Manly FP   Weight Range   Baseline proBNP     HPI: [name] [name] is a very pleasant 79 y/o woman with h/o HTN, HL, DM2, mitral regurgitation (s/p MVR 2/14) and PAF.    Experienced PEA arrest at OSH in 2/14. She was transferred to [facility]. Found to have new onset AF with RVR with normal EF and severe MR due to annular dilation. She underwent heart cath which showed non-obstructive CAD. Unfortunately this was complicated by a large left groin bleed. She eventually underwent minimally Invasive Mitral Valve Repair utilizing a 26 mm Sorin Memo 3D ring for annuloplasty and a MAZE Procedure on 05/20/12. Was discharged on 05/27/2012.  Had AFL in 06/06/12 S/P TEE Successful DC-CV.   Evaulated in [facility] ED 07/01/12 and she was bradycardic. Lopressor and Potassium stopped. (K 5.3) Amiodarone decreased to 200 mg daily.   She returns for follow up. Occasionally SOB with ADLs. Denies SOB/PND/Orthopnea. Ambulates with walker or cane. Weight at home 144 pounds. Followed by [facility] OT/PT. Able to perform all ADLs. Compliant with medications. Daughter prepares pill box. Appetite fair. SBP at home 140s.    ROS: All systems negative except as listed in HPI, PMH and Problem List.  Past Medical History  Diagnosis Date  . Hypertension   . Hyperlipidemia   . Type 2 diabetes mellitus   . Arthritis   . Coronary artery disease     a. Mild nonobstructive by cath 04/2012, cath complicated by groin hematoma.  . S/P mitral valve repair 05/20/2012    a. Severe MR - 04/2012: 26mm Sorin Memo 3D ring annuloplasty via right mini thoracotomy approach  . S/P Maze operation for atrial fibrillation 05/20/2012    a. 04/2012: Complete bi-atrial lesion set using cryothermy  . Atrial fibrillation     a. Diagnosed 04/2012 in the setting of severe MR. b. s/p TEE/successful DCCV 06/06/12.  Marland Kitchen PEA (Pulseless electrical activity)     a.  04/2012 -> adm with afib, developed PEA, transferred to [facility] for prolonged hospitalization.   . Chronic diastolic CHF (congestive heart failure)     Current Outpatient Prescriptions  Medication Sig Dispense Refill  . citalopram (CELEXA) 10 MG tablet Take 10 mg by mouth daily.      Marland Kitchen glimepiride (AMARYL) 1 MG tablet Take 1 mg by mouth daily before breakfast.      . insulin glargine (LANTUS) 100 UNIT/ML injection Inject 18 Units into the skin at bedtime.      . metFORMIN (GLUCOPHAGE) 500 MG tablet Take 500 mg by mouth 2 (two) times daily with a meal.      . rOPINIRole (REQUIP) 0.5 MG tablet Take 0.5 mg by mouth at bedtime.      . traMADol (ULTRAM) 50 MG tablet Take 1-2 tablets (50-100 mg total) by mouth every 4 (four) hours as needed for pain.  30 tablet  0  . traZODone (DESYREL) 50 MG tablet Take 50 mg by mouth at bedtime.      Marland Kitchen warfarin (COUMADIN) 1 MG tablet Take 1 mg by mouth daily at 6 PM. Or as directed by Coumadin Clinic      . amiodarone (PACERONE) 200 MG tablet Take 1 tablet (200 mg total) by mouth daily.  30 tablet  6  . furosemide (LASIX) 40 MG tablet Take 1 tablet (40 mg total) by mouth 2 (two) times  daily.  60 tablet  6  . magnesium oxide (MAG-OX) 400 MG tablet Take 1 tablet (400 mg total) by mouth 2 (two) times daily.  60 tablet  6  . pravastatin (PRAVACHOL) 40 MG tablet Take 1 tablet (40 mg total) by mouth daily.  30 tablet  6   No current facility-administered medications for this encounter.   Facility-Administered Medications Ordered in Other Encounters  Medication Dose Route Frequency Provider Last Rate Last Dose  . fentaNYL (SUBLIMAZE) injection    PRN Ellin Goodie, CRNA   50 mcg at 05/15/12 0700  . midazolam (VERSED) 5 MG/5ML injection    PRN Ellin Goodie, CRNA   0.5 mg at 05/15/12 0700     PHYSICAL EXAM: Filed Vitals:   07/24/12 1112  BP: 144/60  Pulse: 90  Weight: 146 lb 12.8 oz (66.588 kg)  SpO2: 96%    General:  Elderly chronically ill appearing. No resp  difficulty HEENT: normal Neck: supple. JVP flat. Carotids 2+ bilaterally; no bruits. No lymphadenopathy or thryomegaly appreciated. Cor: PMI normal. Regular  No rubs, gallops or murmurs. Lungs: clear Abdomen: soft, nontender, nondistended. No hepatosplenomegaly. No bruits or masses. Good bowel sounds. Extremities: no cyanosis, clubbing, rash, R and  L ankle 1+  Neuro: alert & orientedx3, cranial nerves grossly intact. Moves all 4 extremities w/o difficulty. Affect pleasant.    ASSESSMENT & PLAN:

## 2012-07-26 NOTE — Assessment & Plan Note (Signed)
Maintaining SR on amio. Given recent DC-CV will continue 200 daily for now. Decrease to 100 daily at next visit. Would like to switch her from coumadin to apixaban but she cannot afford at last check. May reconsider soon.

## 2012-07-26 NOTE — Assessment & Plan Note (Signed)
She has post-op depression. Increase Celexa to 20 daily.

## 2012-08-08 ENCOUNTER — Other Ambulatory Visit: Payer: Self-pay | Admitting: *Deleted

## 2012-08-11 ENCOUNTER — Ambulatory Visit: Payer: Medicare Other | Admitting: Thoracic Surgery (Cardiothoracic Vascular Surgery)

## 2012-08-27 ENCOUNTER — Other Ambulatory Visit: Payer: Self-pay | Admitting: *Deleted

## 2012-09-01 ENCOUNTER — Ambulatory Visit (INDEPENDENT_AMBULATORY_CARE_PROVIDER_SITE_OTHER): Payer: Medicare Other | Admitting: Thoracic Surgery (Cardiothoracic Vascular Surgery)

## 2012-09-01 ENCOUNTER — Ambulatory Visit
Admission: RE | Admit: 2012-09-01 | Discharge: 2012-09-01 | Disposition: A | Discharge status: Home / Self Care | Payer: Medicare Other | Source: Ambulatory Visit | Attending: Thoracic Surgery (Cardiothoracic Vascular Surgery) | Admitting: Thoracic Surgery (Cardiothoracic Vascular Surgery)

## 2012-09-01 ENCOUNTER — Encounter: Payer: Self-pay | Admitting: Thoracic Surgery (Cardiothoracic Vascular Surgery)

## 2012-09-01 ENCOUNTER — Encounter (HOSPITAL_COMMUNITY): Payer: Self-pay

## 2012-09-01 ENCOUNTER — Ambulatory Visit: Payer: Medicare Other | Admitting: Thoracic Surgery (Cardiothoracic Vascular Surgery)

## 2012-09-01 ENCOUNTER — Ambulatory Visit (HOSPITAL_COMMUNITY)
Admission: RE | Admit: 2012-09-01 | Discharge: 2012-09-01 | Disposition: A | Discharge status: Home / Self Care | Payer: Medicare Other | Source: Ambulatory Visit | Attending: Internal Medicine | Admitting: Internal Medicine

## 2012-09-01 VITALS — BP 146/65 | HR 81 | Resp 20 | Ht 63.5 in | Wt 144.0 lb

## 2012-09-01 VITALS — BP 142/52 | HR 79 | Wt 146.8 lb

## 2012-09-01 DIAGNOSIS — Z8679 Personal history of other diseases of the circulatory system: Secondary | ICD-10-CM

## 2012-09-01 DIAGNOSIS — I5032 Chronic diastolic (congestive) heart failure: Secondary | ICD-10-CM

## 2012-09-01 DIAGNOSIS — I4891 Unspecified atrial fibrillation: Secondary | ICD-10-CM

## 2012-09-01 DIAGNOSIS — R5383 Other fatigue: Secondary | ICD-10-CM

## 2012-09-01 DIAGNOSIS — Z9889 Other specified postprocedural states: Secondary | ICD-10-CM

## 2012-09-01 DIAGNOSIS — I059 Rheumatic mitral valve disease, unspecified: Secondary | ICD-10-CM

## 2012-09-01 DIAGNOSIS — I5022 Chronic systolic (congestive) heart failure: Secondary | ICD-10-CM

## 2012-09-01 DIAGNOSIS — I5042 Chronic combined systolic (congestive) and diastolic (congestive) heart failure: Secondary | ICD-10-CM | POA: Insufficient documentation

## 2012-09-01 LAB — T4, FREE: Free T4: 1.86 ng/dL — ABNORMAL HIGH (ref 0.80–1.80)

## 2012-09-01 LAB — BASIC METABOLIC PANEL
BUN: 32 mg/dL — ABNORMAL HIGH (ref 6–23)
CO2: 29 mEq/L (ref 19–32)
Calcium: 10.6 mg/dL — ABNORMAL HIGH (ref 8.4–10.5)
Glucose, Bld: 119 mg/dL — ABNORMAL HIGH (ref 70–99)
Sodium: 136 mEq/L (ref 135–145)

## 2012-09-01 LAB — CBC
HCT: 38.6 % (ref 36.0–46.0)
Hemoglobin: 13 g/dL (ref 12.0–15.0)
MCH: 31.3 pg (ref 26.0–34.0)
RBC: 4.15 MIL/uL (ref 3.87–5.11)

## 2012-09-01 MED ORDER — FUROSEMIDE 40 MG PO TABS: 40.0000 mg | ORAL_TABLET | Freq: Every day | ORAL | Status: DC

## 2012-09-01 NOTE — Assessment & Plan Note (Addendum)
Feels weak and appears volume deplete on exam and by orthostatic BPs. Will hold lasix completely for 3 days. Take lasix 40mg  daily only for weight 147 or greater. Check labs today.

## 2012-09-01 NOTE — Patient Instructions (Addendum)
Follow up in 6-8 weeks  Stop lasix until Friday  Only take 40 mg of lasix if your weight is 147 pounds or greater  Do the following things EVERYDAY: 1) Weigh yourself in the morning before breakfast. Write it down and keep it in a log. 2) Take your medicines as prescribed 3) Eat low salt foods-Limit salt (sodium) to 2000 mg per day.  4) Stay as active as you can everyday 5) Limit all fluids for the day to less than 2 liters

## 2012-09-01 NOTE — Assessment & Plan Note (Signed)
She is maintaining SR on low-dose amio. Given Maze procedure we had considered stopping but she did have AFL recently requiring DC-CV so will continue.

## 2012-09-01 NOTE — Progress Notes (Signed)
Patient ID: [name] [name], female   DOB: 1925/01/02, 79 y.o.   MRN: [id number] PCP:  Roe Coombs, MD - Cheryl[facility] Man[facility]   Weight Range   Baseline proBNP     HPI: [name] [name] is a very pleasant 79 y/o woman with h/o HTN, HL, DM2, mitral regurgitation (s/p MVR 2/14) and PAF.    Experienced PEA arrest at OSH in 2/14. She was transferred to [facility]. Found to have new onset AF with RVR with normal EF and severe MR due to annular dilation. She underwent heart cath which showed non-obstructive CAD. Unfortunately this was complicated by a large left groin bleed. She eventually underwent minimally Invasive Mitral Valve Repair utilizing a 26 mm Sorin Memo 3D ring for annuloplasty and a MAZE Procedure on 05/20/12. Was discharged on 05/27/2012.  Had AFL in 06/06/12 S/P TEE Successful DC-CV.   [name] [name] 07/01/12 and she was bradycardic. Lopressor and Potassium stopped. (K 5.3) Amiodarone decreased to 200 mg daily.   She returns for follow up. Complains of dizziness when standing. Weight at home 140-142 pounds. She takes an additional lasix if her weight is 143 pounds.  She is participating in cardiac rehab at [facility].   Daughter prepares pill box. Appetite fair. SBP at home 160-170.   Lying 120/48 Sitting 128/60 Standing 90/60   ROS: All systems negative except as listed in HPI, PMH and Problem List.  Past Medical History  Diagnosis Date  . Hypertension   . Hyperlipidemia   . Type 2 diabetes mellitus   . Arthritis   . Coronary artery disease     a. Mild nonobstructive by cath 04/2012, cath complicated by groin hematoma.  . S/P mitral valve repair 05/20/2012    a. Severe MR - 04/2012: 26mm Sorin Memo 3D ring annuloplasty via right mini thoracotomy approach  . S/P Maze operation for atrial fibrillation 05/20/2012    a. 04/2012: Complete bi-atrial lesion set using cryothermy  . Atrial fibrillation     a. Diagnosed 04/2012 in the setting of severe MR. b. s/p TEE/successful DCCV 06/06/12.  Marland Kitchen PEA  (Pulseless electrical activity)     a. 04/2012 -> adm with afib, developed PEA, transferred to [facility] for prolonged hospitalization.   . Chronic diastolic CHF (congestive heart failure)     Current Outpatient Prescriptions  Medication Sig Dispense Refill  . amiodarone (PACERONE) 200 MG tablet Take 1 tablet (200 mg total) by mouth daily.  30 tablet  6  . citalopram (CELEXA) 10 MG tablet Take 10 mg by mouth daily.      . furosemide (LASIX) 40 MG tablet Take 1 tablet (40 mg total) by mouth daily. Take daily for weight of 147 pounds or great  30 tablet  6  . glimepiride (AMARYL) 1 MG tablet Take 1 mg by mouth daily before breakfast.      . insulin glargine (LANTUS) 100 UNIT/ML injection Inject 18 Units into the skin at bedtime.      . magnesium oxide (MAG-OX) 400 MG tablet Take 1 tablet (400 mg total) by mouth 2 (two) times daily.  60 tablet  6  . metFORMIN (GLUCOPHAGE) 500 MG tablet Take 500 mg by mouth 2 (two) times daily with a meal.      . pravastatin (PRAVACHOL) 40 MG tablet Take 1 tablet (40 mg total) by mouth daily.  30 tablet  6  . rOPINIRole (REQUIP) 0.5 MG tablet Take 0.5 mg by mouth at bedtime.      . traMADol Janean Sark)  50 MG tablet Take 1-2 tablets (50-100 mg total) by mouth every 4 (four) hours as needed for pain.  30 tablet  0  . traZODone (DESYREL) 50 MG tablet Take 50 mg by mouth at bedtime.      Marland Kitchen warfarin (COUMADIN) 1 MG tablet Take 1 mg by mouth daily at 6 PM. Or as directed by [facility]       No current facility-administered medications for this encounter.   Facility-Administered Medications Ordered in Other Encounters  Medication Dose Route Frequency Provider Last Rate Last Dose  . fentaNYL (SUBLIMAZE) injection    PRN Ellin Goodie, CRNA   50 mcg at 05/15/12 0700  . midazolam (VERSED) 5 MG/5ML injection    PRN Ellin Goodie, CRNA   0.5 mg at 05/15/12 0700     PHYSICAL EXAM: Filed Vitals:   09/01/12 1025  BP: 142/52  Pulse: 79  Weight: 146 lb 12.8 oz (66.588 kg)   SpO2: 96%    General:  Elderly. weakappearing. No resp difficulty Daughter present HEENT: normal Neck: supple. JVP flat. Carotids 2+ bilaterally; no bruits. No lymphadenopathy or thryomegaly appreciated. Cor: PMI normal. Regular  No rubs, gallops or murmurs. Lungs: clear Abdomen: soft, nontender, nondistended. No hepatosplenomegaly. No bruits or masses. Good bowel sounds. Extremities: no cyanosis, clubbing, rash, no edema  Neuro: alert & orientedx3, cranial nerves grossly intact. Moves all 4 extremities w/o difficulty. Affect pleasant.    ASSESSMENT & PLAN:

## 2012-09-01 NOTE — Progress Notes (Signed)
[address]       [place] [address]             [phone]     CARDIOTHORACIC SURGERY OFFICE NOTE  Referring Provider is Bensimhon, Bevelyn Buckles, MD PCP is Marylen Ponto, MD   HPI:  Patient returns for followup status post minimally invasive mitral valve repair and Maze procedure. She originally presented in early February with class IV congestive heart failure and atrial fibrillation. She also suffered a brief respiratory arrest at the time of her initial evaluation at [facility] in [place]. Following transfer to [facility] she recovered quickly with medical therapy under the care of Dr. Gala Romney and the heart failure team although she did develop a large left groin hematoma related to a cardiac catheterization associated with acute blood loss anemia that required transfusion. She ultimately underwent surgery on 05/20/2012. Her early postoperative recovery was uncomplicated and she was discharged on 05/27/2012. She did go back into atrial fibrillation but she subsequently underwent TEE and cardioversion by Dr. Gala Romney on 06/06/2012.  She was last seen here in the office on 06/16/2012. In early April she was evaluated in the emergency department with symptomatic bradycardia. Lopressor was stopped and amiodarone decreased to 200 mg daily.  Since then she has been followed very closely in the office by Dr. Gala Romney who also saw her earlier this morning. The patient has been doing reasonably well and participating in the cardiac rehabilitation program. However, over the last week or 2 she has felt somewhat weaker and had frequent dizzy spells with standing.  In the heart failure clinic she was notably orthostatic and clinically appeared dry on exam. She has been instructed to stop taking Lasix for the next few days and then decrease her daily dose.  She states that food still doesn't taste good and her appetite is marginal.  Despite this overall she is getting along  reasonably well and she notes that she feels much better than she did prior to surgery.    Current Outpatient Prescriptions  Medication Sig Dispense Refill  . amiodarone (PACERONE) 200 MG tablet Take 1 tablet (200 mg total) by mouth daily.  30 tablet  6  . citalopram (CELEXA) 10 MG tablet Take 10 mg by mouth daily.      Marland Kitchen glimepiride (AMARYL) 1 MG tablet Take 1 mg by mouth daily before breakfast.      . insulin glargine (LANTUS) 100 UNIT/ML injection Inject 18 Units into the skin at bedtime.      . magnesium oxide (MAG-OX) 400 MG tablet Take 1 tablet (400 mg total) by mouth 2 (two) times daily.  60 tablet  6  . metFORMIN (GLUCOPHAGE) 500 MG tablet Take 500 mg by mouth 2 (two) times daily with a meal.      . pravastatin (PRAVACHOL) 40 MG tablet Take 1 tablet (40 mg total) by mouth daily.  30 tablet  6  . rOPINIRole (REQUIP) 0.5 MG tablet Take 0.5 mg by mouth at bedtime.      . traMADol (ULTRAM) 50 MG tablet Take 1-2 tablets (50-100 mg total) by mouth every 4 (four) hours as needed for pain.  30 tablet  0  . traZODone (DESYREL) 50 MG tablet Take 50 mg by mouth at bedtime.      Marland Kitchen warfarin (COUMADIN) 1 MG tablet Take 1 mg by mouth daily at 6 PM. Or as directed by [facility]      . furosemide (LASIX) 40  MG tablet Take 1 tablet (40 mg total) by mouth daily. Take daily for weight of 147 pounds or great  30 tablet  6   No current facility-administered medications for this visit.   Facility-Administered Medications Ordered in Other Visits  Medication Dose Route Frequency Provider Last Rate Last Dose  . fentaNYL (SUBLIMAZE) injection    PRN Ellin Goodie, CRNA   50 mcg at 05/15/12 0700  . midazolam (VERSED) 5 MG/5ML injection    PRN Ellin Goodie, CRNA   0.5 mg at 05/15/12 0700      Physical Exam:   BP 146/65  Pulse 81  Resp 20  Ht 5' 3.5" (1.613 m)  Wt 144 lb (65.318 kg)  BMI 25.11 kg/m2  SpO2 92%  General:  Well-appearing  Chest:   Clear to auscultation  CV:   Regular rate and  rhythm without murmur  Incisions:  Completely healed  Abdomen:  Soft and nontender  Extremities:  Warm and well-perfused with no lower extremity edema, skin turgor is decreased  Diagnostic Tests:  2 channel telemetry rhythm strip demonstrates normal sinus rhythm  *RADIOLOGY REPORT*  Clinical Data: Mitral valve repair in February, follow-up, some  shortness of breath  CHEST - 2 VIEW  Comparison: Portable chest x-ray of 07/01/2012  Findings: Pleuroparenchymal opacity remains at the right lung base  most consistent with small right pleural effusion and right basilar  atelectasis. There is slight elevation of the right hemidiaphragm.  The left lung is clear. The heart is mildly enlarged and stable.  Mitral valve replacement is noted. There are degenerative changes  throughout the thoracic spine and in both shoulders.  IMPRESSION:  Opacity remains at the right lung base consistent with small right  pleural effusion and right basilar atelectasis.  Original Report Authenticated By: Dwyane Dee, M.D.    Impression:  The patient is doing reasonably well 3 months following minimally invasive mitral valve repair and Maze procedure. She has been maintaining sinus rhythm on low-dose amiodarone. She's been followed carefully by Dr. Gala Romney in heart failure clinic. Her diuretics are currently on hold because she appears somewhat dehydrated.  Plan:  We will defer subsequent decision is regarding medical treatment to Dr. Gala Romney and his team. The patient will return to our office in 3 months for routine followup and rhythm check.   Salvatore Decent. Cornelius Moras, MD 09/01/2012 12:08 PM

## 2012-09-05 ENCOUNTER — Inpatient Hospital Stay (HOSPITAL_COMMUNITY)
Admission: EM | Admit: 2012-09-05 | Discharge: 2012-09-14 | DRG: 186 | Disposition: A | Discharge status: Skilled Nursing Facility | Payer: Medicare Other | Attending: Internal Medicine | Admitting: Internal Medicine

## 2012-09-05 ENCOUNTER — Emergency Department (HOSPITAL_COMMUNITY): Payer: Medicare Other

## 2012-09-05 ENCOUNTER — Encounter (HOSPITAL_COMMUNITY): Payer: Self-pay | Admitting: Emergency Medicine

## 2012-09-05 ENCOUNTER — Telehealth: Payer: Self-pay | Admitting: Physician Assistant

## 2012-09-05 DIAGNOSIS — R0902 Hypoxemia: Secondary | ICD-10-CM

## 2012-09-05 DIAGNOSIS — F32A Depression, unspecified: Secondary | ICD-10-CM

## 2012-09-05 DIAGNOSIS — E785 Hyperlipidemia, unspecified: Secondary | ICD-10-CM | POA: Diagnosis present

## 2012-09-05 DIAGNOSIS — G9340 Encephalopathy, unspecified: Secondary | ICD-10-CM

## 2012-09-05 DIAGNOSIS — T45515A Adverse effect of anticoagulants, initial encounter: Secondary | ICD-10-CM | POA: Diagnosis present

## 2012-09-05 DIAGNOSIS — R0789 Other chest pain: Secondary | ICD-10-CM

## 2012-09-05 DIAGNOSIS — Z8679 Personal history of other diseases of the circulatory system: Secondary | ICD-10-CM

## 2012-09-05 DIAGNOSIS — I340 Nonrheumatic mitral (valve) insufficiency: Secondary | ICD-10-CM

## 2012-09-05 DIAGNOSIS — D509 Iron deficiency anemia, unspecified: Secondary | ICD-10-CM | POA: Diagnosis present

## 2012-09-05 DIAGNOSIS — R001 Bradycardia, unspecified: Secondary | ICD-10-CM

## 2012-09-05 DIAGNOSIS — T148XXA Other injury of unspecified body region, initial encounter: Secondary | ICD-10-CM

## 2012-09-05 DIAGNOSIS — R739 Hyperglycemia, unspecified: Secondary | ICD-10-CM

## 2012-09-05 DIAGNOSIS — IMO0002 Reserved for concepts with insufficient information to code with codable children: Secondary | ICD-10-CM

## 2012-09-05 DIAGNOSIS — R34 Anuria and oliguria: Secondary | ICD-10-CM | POA: Diagnosis not present

## 2012-09-05 DIAGNOSIS — I10 Essential (primary) hypertension: Secondary | ICD-10-CM | POA: Diagnosis present

## 2012-09-05 DIAGNOSIS — Z794 Long term (current) use of insulin: Secondary | ICD-10-CM

## 2012-09-05 DIAGNOSIS — Z7901 Long term (current) use of anticoagulants: Secondary | ICD-10-CM

## 2012-09-05 DIAGNOSIS — I509 Heart failure, unspecified: Secondary | ICD-10-CM | POA: Diagnosis present

## 2012-09-05 DIAGNOSIS — R579 Shock, unspecified: Secondary | ICD-10-CM

## 2012-09-05 DIAGNOSIS — M129 Arthropathy, unspecified: Secondary | ICD-10-CM | POA: Diagnosis present

## 2012-09-05 DIAGNOSIS — J90 Pleural effusion, not elsewhere classified: Principal | ICD-10-CM

## 2012-09-05 DIAGNOSIS — I2510 Atherosclerotic heart disease of native coronary artery without angina pectoris: Secondary | ICD-10-CM | POA: Diagnosis present

## 2012-09-05 DIAGNOSIS — I5031 Acute diastolic (congestive) heart failure: Secondary | ICD-10-CM

## 2012-09-05 DIAGNOSIS — R627 Adult failure to thrive: Secondary | ICD-10-CM

## 2012-09-05 DIAGNOSIS — I4891 Unspecified atrial fibrillation: Secondary | ICD-10-CM

## 2012-09-05 DIAGNOSIS — N179 Acute kidney failure, unspecified: Secondary | ICD-10-CM | POA: Diagnosis not present

## 2012-09-05 DIAGNOSIS — F05 Delirium due to known physiological condition: Secondary | ICD-10-CM | POA: Diagnosis present

## 2012-09-05 DIAGNOSIS — E876 Hypokalemia: Secondary | ICD-10-CM | POA: Diagnosis not present

## 2012-09-05 DIAGNOSIS — B961 Klebsiella pneumoniae [K. pneumoniae] as the cause of diseases classified elsewhere: Secondary | ICD-10-CM | POA: Diagnosis present

## 2012-09-05 DIAGNOSIS — I469 Cardiac arrest, cause unspecified: Secondary | ICD-10-CM

## 2012-09-05 DIAGNOSIS — I5032 Chronic diastolic (congestive) heart failure: Secondary | ICD-10-CM

## 2012-09-05 DIAGNOSIS — I495 Sick sinus syndrome: Secondary | ICD-10-CM | POA: Diagnosis present

## 2012-09-05 DIAGNOSIS — Z8674 Personal history of sudden cardiac arrest: Secondary | ICD-10-CM

## 2012-09-05 DIAGNOSIS — R570 Cardiogenic shock: Secondary | ICD-10-CM

## 2012-09-05 DIAGNOSIS — J9600 Acute respiratory failure, unspecified whether with hypoxia or hypercapnia: Secondary | ICD-10-CM

## 2012-09-05 DIAGNOSIS — Z9889 Other specified postprocedural states: Secondary | ICD-10-CM

## 2012-09-05 DIAGNOSIS — I959 Hypotension, unspecified: Secondary | ICD-10-CM | POA: Diagnosis not present

## 2012-09-05 DIAGNOSIS — E669 Obesity, unspecified: Secondary | ICD-10-CM | POA: Diagnosis present

## 2012-09-05 DIAGNOSIS — L0390 Cellulitis, unspecified: Secondary | ICD-10-CM

## 2012-09-05 DIAGNOSIS — E46 Unspecified protein-calorie malnutrition: Secondary | ICD-10-CM | POA: Diagnosis present

## 2012-09-05 DIAGNOSIS — R791 Abnormal coagulation profile: Secondary | ICD-10-CM | POA: Diagnosis present

## 2012-09-05 DIAGNOSIS — E1165 Type 2 diabetes mellitus with hyperglycemia: Secondary | ICD-10-CM | POA: Diagnosis present

## 2012-09-05 DIAGNOSIS — Z6825 Body mass index (BMI) 25.0-25.9, adult: Secondary | ICD-10-CM

## 2012-09-05 DIAGNOSIS — I5033 Acute on chronic diastolic (congestive) heart failure: Secondary | ICD-10-CM

## 2012-09-05 DIAGNOSIS — Z22322 Carrier or suspected carrier of Methicillin resistant Staphylococcus aureus: Secondary | ICD-10-CM

## 2012-09-05 DIAGNOSIS — N390 Urinary tract infection, site not specified: Secondary | ICD-10-CM

## 2012-09-05 DIAGNOSIS — F329 Major depressive disorder, single episode, unspecified: Secondary | ICD-10-CM

## 2012-09-05 DIAGNOSIS — E875 Hyperkalemia: Secondary | ICD-10-CM

## 2012-09-05 LAB — CBC WITH DIFFERENTIAL/PLATELET
HCT: 36.1 % (ref 36.0–46.0)
Hemoglobin: 12.3 g/dL (ref 12.0–15.0)
Lymphocytes Relative: 14 % (ref 12–46)
MCHC: 34.1 g/dL (ref 30.0–36.0)
MCV: 91.9 fL (ref 78.0–100.0)
Monocytes Absolute: 0.5 10*3/uL (ref 0.1–1.0)
Monocytes Relative: 8 % (ref 3–12)
Neutro Abs: 5.1 10*3/uL (ref 1.7–7.7)
WBC: 6.7 10*3/uL (ref 4.0–10.5)

## 2012-09-05 LAB — COMPREHENSIVE METABOLIC PANEL
BUN: 27 mg/dL — ABNORMAL HIGH (ref 6–23)
CO2: 27 mEq/L (ref 19–32)
Chloride: 100 mEq/L (ref 96–112)
Creatinine, Ser: 0.83 mg/dL (ref 0.50–1.10)
GFR calc Af Amer: 72 mL/min — ABNORMAL LOW (ref >90)
GFR calc non Af Amer: 62 mL/min — ABNORMAL LOW (ref >90)
Total Bilirubin: 0.8 mg/dL (ref 0.3–1.2)

## 2012-09-05 LAB — URINE MICROSCOPIC-ADD ON

## 2012-09-05 LAB — URINALYSIS, ROUTINE W REFLEX MICROSCOPIC
Protein, ur: NEGATIVE mg/dL
Urobilinogen, UA: 4 mg/dL — ABNORMAL HIGH (ref 0.0–1.0)

## 2012-09-05 LAB — POCT I-STAT TROPONIN I: Troponin i, poc: 0 ng/mL (ref 0.00–0.08)

## 2012-09-05 MED ORDER — SODIUM CHLORIDE 0.9 % IV BOLUS (SEPSIS)
1000.0000 mL | Freq: Once | INTRAVENOUS | Status: AC
  Administered 2012-09-05: 1000 mL via INTRAVENOUS

## 2012-09-05 MED ORDER — THIAMINE HCL 100 MG/ML IJ SOLN
100.0000 mg | Freq: Once | INTRAMUSCULAR | Status: AC
  Administered 2012-09-05: 100 mg via INTRAVENOUS
  Filled 2012-09-05: qty 2

## 2012-09-05 NOTE — Telephone Encounter (Signed)
Pt's daughter called the answering service re: pt c/o increased dizziness, fatigue, weakness and decreased PO intake. She was seen in the HF clinic 4 days ago and Lasix was held secondary to volume depletion. She was noted to be orthostatic. Labwork- BMET (BUN 32 mildly up from 4/14 at 26), otherwise CBC, TSH, T4 unremarkable. CXR with small R pleural effusion with atelectasis. Weight 146 lbs (> 147 lbs = volume overload). Daughter is transporting the patient to the [facility] ED. Agreed, will make D. Bensimhon, MD aware.   Jacqulyn Bath, PA-C 09/05/2012/ 7:57 PM

## 2012-09-05 NOTE — ED Notes (Signed)
Pt presents with a c/o fatigue, dry cough, and general malaise. Pt states she was seen by Dr. Clarise Cruz and told to increase fluid intake due to dehydration but pt states she has not been drinking and eating much. Pt denies any fever or chills. Pt alert and oriented.

## 2012-09-05 NOTE — ED Notes (Signed)
Pt states she has also been feeling dizzy for the past few days.

## 2012-09-05 NOTE — ED Notes (Signed)
PT. REPORTS GENERALIZED WEAKNESS /FATIGUE WITH POOR APPETITE / POOR FLUID INTAKE WITH DIZZINESS AND OCCASIONAL DRY COUGH FOR SEVERAL DAYS . SEEN BY DR. Clarise Cruz LAST Monday ADVISED TO DRINK FLUIDS DUE TO DEHYDRATION .

## 2012-09-05 NOTE — ED Provider Notes (Signed)
History     CSN: [id number]  Arrival date & time 09/05/12  2004   First MD Initiated Contact with Patient 09/05/12 2242      Chief Complaint  Patient presents with  . Fatigue    (Consider location/radiation/quality/duration/timing/severity/associated sxs/prior treatment) HPI Complains of generalized weakness for several months. A. by diminished appetite. Patient evaluated by Dr.Besihmon 4 days ago determined to be volume depleted. Lasix was held. Patient reports lightheadedness. Worse with standing improved with lying supine. Denies shortness of breath denies abdominal pain denies blood per rectum. No black stools. No other complaint.. No other complaint. Feels thirsty. Admits to diminished appetite for several months. Past Medical History  Diagnosis Date  . Hypertension   . Hyperlipidemia   . Type 2 diabetes mellitus   . Arthritis   . Coronary artery disease     a. Mild nonobstructive by cath 04/2012, cath complicated by groin hematoma.  . S/P mitral valve repair 05/20/2012    a. Severe MR - 04/2012: 26mm Sorin Memo 3D ring annuloplasty via right mini thoracotomy approach  . S/P Maze operation for atrial fibrillation 05/20/2012    a. 04/2012: Complete bi-atrial lesion set using cryothermy  . Atrial fibrillation     a. Diagnosed 04/2012 in the setting of severe MR. b. s/p TEE/successful DCCV 06/06/12.  Marland Kitchen PEA (Pulseless electrical activity)     a. 04/2012 -> adm with afib, developed PEA, transferred to [facility] for prolonged hospitalization.   . Chronic diastolic CHF (congestive heart failure)     Past Surgical History  Procedure Laterality Date  . Abdominal hysterectomy    . Cholecystectomy    . Eye surgery    . Appendectomy    . Tee without cardioversion N/A 05/09/2012    Procedure: TRANSESOPHAGEAL ECHOCARDIOGRAM (TEE);  Surgeon: Dolores Patty, MD;  Location: [facility] ENDOSCOPY;  Service: Cardiovascular;  Laterality: N/A;  . Mitral valve repair Right 05/20/2012    Procedure: MINIMALLY  INVASIVE MITRAL VALVE REPAIR (MVR);  Surgeon: Purcell Nails, MD;  Location: [facility] OR;  Service: Open Heart Surgery;  Laterality: Right;  . Minimally invasive maze procedure N/A 05/20/2012    Procedure: MINIMALLY INVASIVE MAZE PROCEDURE;  Surgeon: Purcell Nails, MD;  Location: [facility] OR;  Service: Open Heart Surgery;  Laterality: N/A;  . Intraoperative transesophageal echocardiogram N/A 05/20/2012    Procedure: INTRAOPERATIVE TRANSESOPHAGEAL ECHOCARDIOGRAM;  Surgeon: Purcell Nails, MD;  Location: [facility] OR;  Service: Open Heart Surgery;  Laterality: N/A;  . Tee without cardioversion N/A 06/06/2012    Procedure: TRANSESOPHAGEAL ECHOCARDIOGRAM (TEE);  Surgeon: Dolores Patty, MD;  Location: [facility] ENDOSCOPY;  Service: Cardiovascular;  Laterality: N/A;  . Cardioversion N/A 06/06/2012    Procedure: CARDIOVERSION;  Surgeon: Dolores Patty, MD;  Location: [facility] ENDOSCOPY;  Service: Cardiovascular;  Laterality: N/A;    No family history on file.  History  Substance Use Topics  . Smoking status: Never Smoker   . Smokeless tobacco: Never Used  . Alcohol Use: No    OB History   Grav Para Term Preterm Abortions TAB SAB Ect Mult Living                  Review of Systems  Neurological: Positive for weakness.  All other systems reviewed and are negative.    Allergies  Codeine  Home Medications   Current Outpatient Rx  Name  Route  Sig  Dispense  Refill  . amiodarone (PACERONE) 200 MG tablet   Oral   Take 1 tablet (  200 mg total) by mouth daily.   30 tablet   6   . citalopram (CELEXA) 10 MG tablet   Oral   Take 10 mg by mouth daily.         Marland Kitchen glimepiride (AMARYL) 1 MG tablet   Oral   Take 1 mg by mouth daily before breakfast.         . insulin glargine (LANTUS) 100 UNIT/ML injection   Subcutaneous   Inject 18 Units into the skin at bedtime.         . magnesium oxide (MAG-OX) 400 MG tablet   Oral   Take 1 tablet (400 mg total) by mouth 2 (two) times daily.   60 tablet   6    . meclizine (ANTIVERT) 25 MG tablet   Oral   Take 25 mg by mouth daily as needed for dizziness.         . metFORMIN (GLUCOPHAGE) 500 MG tablet   Oral   Take 500 mg by mouth 2 (two) times daily with a meal.         . pravastatin (PRAVACHOL) 40 MG tablet   Oral   Take 40 mg by mouth at bedtime.         Marland Kitchen rOPINIRole (REQUIP) 0.5 MG tablet   Oral   Take 0.5 mg by mouth at bedtime.         . traMADol (ULTRAM) 50 MG tablet   Oral   Take 50-100 mg by mouth every 4 (four) hours as needed for pain.         . traZODone (DESYREL) 50 MG tablet   Oral   Take 50 mg by mouth at bedtime.         Marland Kitchen warfarin (COUMADIN) 2 MG tablet   Oral   Take 1-2 mg by mouth daily. Sat & Sun only-0.5 half tab (1 mg total); All other days 1 tab (2 mg total)           BP 141/49  Pulse 81  Temp(Src) 98.2 F (36.8 C) (Oral)  Resp 16  SpO2 93%  Physical Exam  Nursing note and vitals reviewed. Constitutional: No distress.  Chronically ill-appearing  HENT:  Head: Normocephalic and atraumatic.  Eyes: Conjunctivae are normal. Pupils are equal, round, and reactive to light.  Neck: Neck supple. No tracheal deviation present. No thyromegaly present.  Cardiovascular: Normal rate and regular rhythm.   No murmur heard. Pulmonary/Chest: Effort normal and breath sounds normal.  Abdominal: Soft. Bowel sounds are normal. She exhibits no distension. There is no tenderness.  Musculoskeletal: Normal range of motion. She exhibits no edema and no tenderness.  Neurological: She is alert. Coordination normal.  Skin: Skin is warm and dry. No rash noted.  Psychiatric: She has a normal mood and affect.    ED Course  Procedures (including critical care time)  Labs Reviewed  COMPREHENSIVE METABOLIC PANEL - Abnormal; Notable for the following:    Sodium 134 (*)    Glucose, Bld 192 (*)    BUN 27 (*)    Albumin 3.2 (*)    GFR calc non Af Amer 62 (*)    GFR calc Af Amer 72 (*)    All other components  within normal limits  CBC WITH DIFFERENTIAL  URINALYSIS, ROUTINE W REFLEX MICROSCOPIC   Dg Chest 2 View  09/05/2012   *RADIOLOGY REPORT*  Clinical Data: fatigue  CHEST - 2 VIEW  Comparison: 09/01/2012  Findings: Borderline cardiomegaly.  Small to moderate  right pleural effusion with right lower lobe atelectasis or infiltrate.  Small left pleural effusion with left basilar atelectasis.  No pulmonary edema.  IMPRESSION: Small to moderate right pleural effusion with right lower lobe atelectasis or infiltrate.  Small left pleural effusion with left basilar atelectasis.  No pulmonary edema.   Original Report Authenticated By: Natasha Mead, M.D.    Date: 09/05/2012  Rate: 75  Rhythm: normal sinus rhythm  QRS Axis: normal  Intervals: normal  ST/T Wave abnormalities: nonspecific T wave changes  Conduction Disutrbances:none  Narrative Interpretation:   Old EKG Reviewed: no significant change from 07/24/12 interpreted by me Results for orders placed during the hospital encounter of 09/05/12  CBC WITH DIFFERENTIAL      Result Value Range   WBC 6.7  4.0 - 10.5 K/uL   RBC 3.93  3.87 - 5.11 MIL/uL   Hemoglobin 12.3  12.0 - 15.0 g/dL   HCT 16.1  09.6 - 04.5 %   MCV 91.9  78.0 - 100.0 fL   MCH 31.3  26.0 - 34.0 pg   MCHC 34.1  30.0 - 36.0 g/dL   RDW 40.9  81.1 - 91.4 %   Platelets 256  150 - 400 K/uL   Neutrophils Relative % 77  43 - 77 %   Neutro Abs 5.1  1.7 - 7.7 K/uL   Lymphocytes Relative 14  12 - 46 %   Lymphs Abs 0.9  0.7 - 4.0 K/uL   Monocytes Relative 8  3 - 12 %   Monocytes Absolute 0.5  0.1 - 1.0 K/uL   Eosinophils Relative 2  0 - 5 %   Eosinophils Absolute 0.1  0.0 - 0.7 K/uL   Basophils Relative 0  0 - 1 %   Basophils Absolute 0.0  0.0 - 0.1 K/uL  COMPREHENSIVE METABOLIC PANEL      Result Value Range   Sodium 134 (*) 135 - 145 mEq/L   Potassium 4.1  3.5 - 5.1 mEq/L   Chloride 100  96 - 112 mEq/L   CO2 27  19 - 32 mEq/L   Glucose, Bld 192 (*) 70 - 99 mg/dL   BUN 27 (*) 6 - 23  mg/dL   Creatinine, Ser 7.82  0.50 - 1.10 mg/dL   Calcium 9.6  8.4 - 95.6 mg/dL   Total Protein 6.4  6.0 - 8.3 g/dL   Albumin 3.2 (*) 3.5 - 5.2 g/dL   AST 29  0 - 37 U/L   ALT 21  0 - 35 U/L   Alkaline Phosphatase 91  39 - 117 U/L   Total Bilirubin 0.8  0.3 - 1.2 mg/dL   GFR calc non Af Amer 62 (*) >90 mL/min   GFR calc Af Amer 72 (*) >90 mL/min  URINALYSIS, ROUTINE W REFLEX MICROSCOPIC      Result Value Range   Color, Urine YELLOW  YELLOW   APPearance CLOUDY (*) CLEAR   Specific Gravity, Urine 1.021  1.005 - 1.030   pH 6.0  5.0 - 8.0   Glucose, UA NEGATIVE  NEGATIVE mg/dL   Hgb urine dipstick NEGATIVE  NEGATIVE   Bilirubin Urine SMALL (*) NEGATIVE   Ketones, ur 15 (*) NEGATIVE mg/dL   Protein, ur NEGATIVE  NEGATIVE mg/dL   Urobilinogen, UA 4.0 (*) 0.0 - 1.0 mg/dL   Nitrite POSITIVE (*) NEGATIVE   Leukocytes, UA MODERATE (*) NEGATIVE  URINE MICROSCOPIC-ADD ON      Result Value Range   Squamous Epithelial /  LPF FEW (*) RARE   WBC, UA 7-10  <3 WBC/hpf   Bacteria, UA MANY (*) RARE   Crystals CA OXALATE CRYSTALS (*) NEGATIVE  POCT I-STAT TROPONIN I      Result Value Range   Troponin i, poc 0.00  0.00 - 0.08 ng/mL   Comment 3           POCT I-STAT 3, BLOOD GAS (G3+)      Result Value Range   pH, Arterial 7.423  7.350 - 7.450   pCO2 arterial 32.9 (*) 35.0 - 45.0 mmHg   pO2, Arterial 55.0 (*) 80.0 - 100.0 mmHg   Bicarbonate 21.5  20.0 - 24.0 mEq/L   TCO2 22  0 - 100 mmol/L   O2 Saturation 89.0     Acid-base deficit 2.0  0.0 - 2.0 mmol/L   Patient temperature 98.6 F     Collection site RADIAL, ALLEN'S TEST ACCEPTABLE     Drawn by Operator     Sample type ARTERIAL     Dg Chest 2 View  09/05/2012   *RADIOLOGY REPORT*  Clinical Data: fatigue  CHEST - 2 VIEW  Comparison: 09/01/2012  Findings: Borderline cardiomegaly.  Small to moderate right pleural effusion with right lower lobe atelectasis or infiltrate.  Small left pleural effusion with left basilar atelectasis.  No pulmonary  edema.  IMPRESSION: Small to moderate right pleural effusion with right lower lobe atelectasis or infiltrate.  Small left pleural effusion with left basilar atelectasis.  No pulmonary edema.   Original Report Authenticated By: Natasha Mead, M.D.   Dg Chest 2 View  09/01/2012   *RADIOLOGY REPORT*  Clinical Data: Mitral valve repair in February, follow-up, some shortness of breath  CHEST - 2 VIEW  Comparison: Portable chest x-ray of 07/01/2012  Findings: Pleuroparenchymal opacity remains at the right lung base most consistent with small right pleural effusion and right basilar atelectasis.  There is slight elevation of the right hemidiaphragm. The left lung is clear.  The heart is mildly enlarged and stable. Mitral valve replacement is noted.  There are degenerative changes throughout the thoracic spine and in both shoulders.  IMPRESSION: Opacity remains at the right lung base consistent with small right pleural effusion and right basilar atelectasis.   Original Report Authenticated By: Dwyane Dee, M.D.    1:55 AM patient remains generally weak. She's able walk with assistance, a few feet. Chest x-ray viewed by me No diagnosis found.  Spoke with Dr. Antoine Poche who will consult  o pt . Derfers to an internal medicine service for admission. MDM    Hospitalist MD called to evalaute pt for admission Dx #1 weaknes #2 hypoxemia #3 uti      Doug Sou, MD 09/06/12 1610

## 2012-09-06 ENCOUNTER — Encounter (HOSPITAL_COMMUNITY): Payer: Self-pay | Admitting: Cardiology

## 2012-09-06 DIAGNOSIS — N390 Urinary tract infection, site not specified: Secondary | ICD-10-CM | POA: Diagnosis present

## 2012-09-06 DIAGNOSIS — J90 Pleural effusion, not elsewhere classified: Secondary | ICD-10-CM

## 2012-09-06 DIAGNOSIS — I4891 Unspecified atrial fibrillation: Secondary | ICD-10-CM

## 2012-09-06 DIAGNOSIS — R0902 Hypoxemia: Secondary | ICD-10-CM

## 2012-09-06 DIAGNOSIS — I5031 Acute diastolic (congestive) heart failure: Secondary | ICD-10-CM

## 2012-09-06 DIAGNOSIS — IMO0001 Reserved for inherently not codable concepts without codable children: Secondary | ICD-10-CM

## 2012-09-06 LAB — CBC
HCT: 35.7 % — ABNORMAL LOW (ref 36.0–46.0)
Hemoglobin: 11.9 g/dL — ABNORMAL LOW (ref 12.0–15.0)
MCH: 30.8 pg (ref 26.0–34.0)
MCHC: 33.3 g/dL (ref 30.0–36.0)
MCV: 92.5 fL (ref 78.0–100.0)
RDW: 14.4 % (ref 11.5–15.5)

## 2012-09-06 LAB — GLUCOSE, CAPILLARY
Glucose-Capillary: 100 mg/dL — ABNORMAL HIGH (ref 70–99)
Glucose-Capillary: 146 mg/dL — ABNORMAL HIGH (ref 70–99)
Glucose-Capillary: 139 mg/dL — ABNORMAL HIGH (ref 70–99)

## 2012-09-06 LAB — POCT I-STAT 3, ART BLOOD GAS (G3+)
O2 Saturation: 89 %
pCO2 arterial: 32.9 mmHg — ABNORMAL LOW (ref 35.0–45.0)
pO2, Arterial: 55 mmHg — ABNORMAL LOW (ref 80.0–100.0)

## 2012-09-06 LAB — BASIC METABOLIC PANEL
BUN: 24 mg/dL — ABNORMAL HIGH (ref 6–23)
CO2: 23 mEq/L (ref 19–32)
Chloride: 103 mEq/L (ref 96–112)
Creatinine, Ser: 0.83 mg/dL (ref 0.50–1.10)
GFR calc Af Amer: 72 mL/min — ABNORMAL LOW (ref >90)
Glucose, Bld: 108 mg/dL — ABNORMAL HIGH (ref 70–99)
Potassium: 3.7 mEq/L (ref 3.5–5.1)

## 2012-09-06 LAB — D-DIMER, QUANTITATIVE: D-Dimer, Quant: 0.32 ug/mL-FEU (ref 0.00–0.48)

## 2012-09-06 LAB — MRSA PCR SCREENING: MRSA by PCR: POSITIVE — AB

## 2012-09-06 MED ORDER — INSULIN GLARGINE 100 UNIT/ML ~~LOC~~ SOLN
18.0000 [IU] | Freq: Every day | SUBCUTANEOUS | Status: DC
  Administered 2012-09-07 – 2012-09-09 (×3): 18 [IU] via SUBCUTANEOUS
  Filled 2012-09-06 (×6): qty 0.18

## 2012-09-06 MED ORDER — SODIUM CHLORIDE 0.9 % IJ SOLN
3.0000 mL | INTRAMUSCULAR | Status: DC | PRN
  Administered 2012-09-06: 3 mL via INTRAVENOUS

## 2012-09-06 MED ORDER — ONDANSETRON HCL 4 MG PO TABS: 4.0000 mg | ORAL_TABLET | Freq: Four times a day (QID) | ORAL | Status: DC | PRN

## 2012-09-06 MED ORDER — CHLORHEXIDINE GLUCONATE CLOTH 2 % EX PADS
6.0000 | MEDICATED_PAD | Freq: Every day | CUTANEOUS | Status: AC
  Administered 2012-09-06 – 2012-09-10 (×5): 6 via TOPICAL

## 2012-09-06 MED ORDER — ACETAMINOPHEN 650 MG RE SUPP: 650.0000 mg | Freq: Four times a day (QID) | RECTAL | Status: DC | PRN

## 2012-09-06 MED ORDER — POTASSIUM CHLORIDE CRYS ER 20 MEQ PO TBCR
20.0000 meq | EXTENDED_RELEASE_TABLET | Freq: Two times a day (BID) | ORAL | Status: AC
  Administered 2012-09-06 – 2012-09-07 (×4): 20 meq via ORAL
  Filled 2012-09-06 (×4): qty 1

## 2012-09-06 MED ORDER — HYDROMORPHONE HCL PF 1 MG/ML IJ SOLN: 0.5000 mg | INTRAMUSCULAR | Status: DC | PRN

## 2012-09-06 MED ORDER — ROPINIROLE HCL 0.5 MG PO TABS
0.5000 mg | ORAL_TABLET | Freq: Every day | ORAL | Status: DC
  Administered 2012-09-06 – 2012-09-13 (×8): 0.5 mg via ORAL
  Filled 2012-09-06 (×10): qty 1

## 2012-09-06 MED ORDER — WARFARIN SODIUM 3 MG PO TABS
3.0000 mg | ORAL_TABLET | Freq: Once | ORAL | Status: AC
  Administered 2012-09-06: 3 mg via ORAL
  Filled 2012-09-06: qty 1

## 2012-09-06 MED ORDER — SODIUM CHLORIDE 0.9 % IJ SOLN
3.0000 mL | Freq: Two times a day (BID) | INTRAMUSCULAR | Status: DC
  Administered 2012-09-06: 3 mL via INTRAVENOUS

## 2012-09-06 MED ORDER — OXYCODONE HCL 5 MG PO TABS: 5.0000 mg | ORAL_TABLET | ORAL | Status: DC | PRN

## 2012-09-06 MED ORDER — SIMVASTATIN 20 MG PO TABS
20.0000 mg | ORAL_TABLET | Freq: Every day | ORAL | Status: DC
  Administered 2012-09-06 – 2012-09-13 (×8): 20 mg via ORAL
  Filled 2012-09-06 (×9): qty 1

## 2012-09-06 MED ORDER — SODIUM CHLORIDE 0.9 % IJ SOLN
3.0000 mL | Freq: Two times a day (BID) | INTRAMUSCULAR | Status: DC
Start: 2012-09-06 — End: 2012-09-06

## 2012-09-06 MED ORDER — SODIUM CHLORIDE 0.9 % IJ SOLN: 3.0000 mL | INTRAMUSCULAR | Status: DC | PRN

## 2012-09-06 MED ORDER — ACETAMINOPHEN 325 MG PO TABS: 650.0000 mg | ORAL_TABLET | Freq: Four times a day (QID) | ORAL | Status: DC | PRN

## 2012-09-06 MED ORDER — SODIUM CHLORIDE 0.9 % IV SOLN: 250.0000 mL | INTRAVENOUS | Status: DC | PRN

## 2012-09-06 MED ORDER — MUPIROCIN 2 % EX OINT
1.0000 "application " | TOPICAL_OINTMENT | Freq: Two times a day (BID) | CUTANEOUS | Status: AC
  Administered 2012-09-06 – 2012-09-10 (×10): 1 via NASAL
  Filled 2012-09-06 (×2): qty 22

## 2012-09-06 MED ORDER — MECLIZINE HCL 25 MG PO TABS
25.0000 mg | ORAL_TABLET | Freq: Every day | ORAL | Status: DC | PRN
  Filled 2012-09-06: qty 1

## 2012-09-06 MED ORDER — ASPIRIN EC 325 MG PO TBEC
325.0000 mg | DELAYED_RELEASE_TABLET | Freq: Every day | ORAL | Status: DC
  Administered 2012-09-06 – 2012-09-10 (×5): 325 mg via ORAL
  Filled 2012-09-06 (×5): qty 1

## 2012-09-06 MED ORDER — WARFARIN - PHARMACIST DOSING INPATIENT
Freq: Every day | Status: DC
  Administered 2012-09-07: 18:00:00

## 2012-09-06 MED ORDER — CITALOPRAM HYDROBROMIDE 10 MG PO TABS
10.0000 mg | ORAL_TABLET | Freq: Every day | ORAL | Status: DC
  Administered 2012-09-06 – 2012-09-14 (×9): 10 mg via ORAL
  Filled 2012-09-06 (×9): qty 1

## 2012-09-06 MED ORDER — ROPINIROLE HCL 0.5 MG PO TABS
0.5000 mg | ORAL_TABLET | Freq: Once | ORAL | Status: AC
  Administered 2012-09-06: 0.5 mg via ORAL
  Filled 2012-09-06: qty 1

## 2012-09-06 MED ORDER — ENOXAPARIN SODIUM 60 MG/0.6ML ~~LOC~~ SOLN
45.0000 mg | Freq: Two times a day (BID) | SUBCUTANEOUS | Status: DC
  Administered 2012-09-06 – 2012-09-07 (×2): 45 mg via SUBCUTANEOUS
  Filled 2012-09-06 (×4): qty 0.6

## 2012-09-06 MED ORDER — AZITHROMYCIN 500 MG PO TABS
500.0000 mg | ORAL_TABLET | Freq: Every day | ORAL | Status: DC
  Administered 2012-09-06 – 2012-09-07 (×2): 500 mg via ORAL
  Filled 2012-09-06 (×2): qty 1

## 2012-09-06 MED ORDER — INSULIN ASPART 100 UNIT/ML ~~LOC~~ SOLN: 0.0000 [IU] | Freq: Every day | SUBCUTANEOUS | Status: DC

## 2012-09-06 MED ORDER — MAGNESIUM OXIDE 400 (241.3 MG) MG PO TABS
400.0000 mg | ORAL_TABLET | Freq: Two times a day (BID) | ORAL | Status: DC
  Administered 2012-09-06 – 2012-09-14 (×18): 400 mg via ORAL
  Filled 2012-09-06 (×21): qty 1

## 2012-09-06 MED ORDER — INSULIN ASPART 100 UNIT/ML ~~LOC~~ SOLN
0.0000 [IU] | Freq: Three times a day (TID) | SUBCUTANEOUS | Status: DC
  Administered 2012-09-06: 2 [IU] via SUBCUTANEOUS
  Administered 2012-09-06 – 2012-09-10 (×4): 1 [IU] via SUBCUTANEOUS
  Administered 2012-09-12: 5 [IU] via SUBCUTANEOUS
  Administered 2012-09-12 – 2012-09-13 (×2): 2 [IU] via SUBCUTANEOUS
  Administered 2012-09-14: 1 [IU] via SUBCUTANEOUS

## 2012-09-06 MED ORDER — AMIODARONE HCL 200 MG PO TABS
200.0000 mg | ORAL_TABLET | Freq: Every day | ORAL | Status: DC
  Administered 2012-09-06 – 2012-09-10 (×5): 200 mg via ORAL
  Filled 2012-09-06 (×5): qty 1

## 2012-09-06 MED ORDER — ONDANSETRON HCL 4 MG/2ML IJ SOLN: 4.0000 mg | Freq: Four times a day (QID) | INTRAMUSCULAR | Status: DC | PRN

## 2012-09-06 MED ORDER — FUROSEMIDE 40 MG PO TABS
40.0000 mg | ORAL_TABLET | Freq: Two times a day (BID) | ORAL | Status: AC
  Administered 2012-09-06 – 2012-09-07 (×4): 40 mg via ORAL
  Filled 2012-09-06 (×4): qty 1

## 2012-09-06 MED ORDER — WARFARIN SODIUM 1 MG PO TABS: 1.0000 mg | ORAL_TABLET | Freq: Every day | ORAL | Status: DC

## 2012-09-06 MED ORDER — ZOLPIDEM TARTRATE 5 MG PO TABS
5.0000 mg | ORAL_TABLET | Freq: Every evening | ORAL | Status: DC | PRN
  Administered 2012-09-06 – 2012-09-13 (×7): 5 mg via ORAL
  Filled 2012-09-06 (×7): qty 1

## 2012-09-06 MED ORDER — LISINOPRIL 2.5 MG PO TABS
2.5000 mg | ORAL_TABLET | Freq: Every day | ORAL | Status: DC
  Administered 2012-09-06 – 2012-09-10 (×5): 2.5 mg via ORAL
  Filled 2012-09-06 (×6): qty 1

## 2012-09-06 MED ORDER — CEFUROXIME AXETIL 500 MG PO TABS
500.0000 mg | ORAL_TABLET | Freq: Once | ORAL | Status: AC
  Administered 2012-09-06: 500 mg via ORAL
  Filled 2012-09-06 (×2): qty 1

## 2012-09-06 MED ORDER — CEFTRIAXONE SODIUM 1 G IJ SOLR
1.0000 g | INTRAMUSCULAR | Status: DC
  Administered 2012-09-06 – 2012-09-09 (×4): 1 g via INTRAVENOUS
  Filled 2012-09-06 (×5): qty 10

## 2012-09-06 MED ORDER — WARFARIN SODIUM 2 MG PO TABS
2.0000 mg | ORAL_TABLET | Freq: Once | ORAL | Status: DC
  Filled 2012-09-06: qty 1

## 2012-09-06 MED ORDER — CARVEDILOL 3.125 MG PO TABS
3.1250 mg | ORAL_TABLET | Freq: Two times a day (BID) | ORAL | Status: DC
  Administered 2012-09-06 – 2012-09-10 (×9): 3.125 mg via ORAL
  Filled 2012-09-06 (×11): qty 1

## 2012-09-06 MED ORDER — ALUM & MAG HYDROXIDE-SIMETH 200-200-20 MG/5ML PO SUSP: 30.0000 mL | Freq: Four times a day (QID) | ORAL | Status: DC | PRN

## 2012-09-06 NOTE — Progress Notes (Signed)
ANTICOAGULATION CONSULT NOTE - Initial Consult  Pharmacy Consult for Lovenox Indication: atrial fibrillation  Allergies  Allergen Reactions  . Codeine Nausea And Vomiting    Patient Measurements: Height: 5' 3.5" (161.3 cm) Weight: 144 lb (65.318 kg) IBW/kg (Calculated) : 53.55 Heparin Dosing Weight:    Vital Signs: Temp: 98.5 F (36.9 C) (06/14 1142) Temp src: Oral (06/14 1142) BP: 158/64 mmHg (06/14 0731) Pulse Rate: 88 (06/14 0731)  Labs:  Recent Labs  09/05/12 2021 09/06/12 0211 09/06/12 0550  HGB 12.3  --  11.9*  HCT 36.1  --  35.7*  PLT 256  --  250  LABPROT  --  19.8*  --   INR  --  1.75*  --   CREATININE 0.83  --  0.83    Estimated Creatinine Clearance: 44.8 ml/min (by C-G formula based on Cr of 0.83).   Medical History: Past Medical History  Diagnosis Date  . Hypertension   . Hyperlipidemia   . Type 2 diabetes mellitus   . Arthritis   . Coronary artery disease     a. Mild nonobstructive by cath 04/2012, cath complicated by groin hematoma.  . S/P mitral valve repair 05/20/2012    a. Severe MR - 04/2012: 26mm Sorin Memo 3D ring annuloplasty via right mini thoracotomy approach  . S/P Maze operation for atrial fibrillation 05/20/2012    a. 04/2012: Complete bi-atrial lesion set using cryothermy  . Atrial fibrillation     a. Diagnosed 04/2012 in the setting of severe MR. b. s/p TEE/successful DCCV 06/06/12.  Marland Kitchen PEA (Pulseless electrical activity)     a. 04/2012 -> adm with afib, developed PEA, transferred to [facility] for prolonged hospitalization.   . Chronic diastolic CHF (congestive heart failure)     Assessment: Weakness, Sats in 80's. CXR=R pleural effusion with RLL infiltrate + UTI  Anticoagulation: Pt on coumadin PTA for afib. Baseline INR 1.75 - subtherapeutic. Home dose 2mg  daily except for 1mg  on Sat and Sun.No bleeding noted. CBC stable at baseline. Add Lovenox until INR>2.  Goal of Therapy:  Anti-Xa level 0.6-1.2 units/ml 4hrs after LMWH dose  given Monitor platelets by anticoagulation protocol: Yes   Plan:  1) Daily INR 2) Coumadin 3mg  today 3) Lovenox 45 mg SQ q12h until INR>2  [name] [name], PharmD, BCPS Clinical Staff Pharmacist Pager [phone]  Misty Stanley Stillinger 09/06/2012,2:18 PM

## 2012-09-06 NOTE — Progress Notes (Signed)
ANTICOAGULATION CONSULT NOTE - Initial Consult  Pharmacy Consult for Coumadin Indication: atrial fibrillation  Allergies  Allergen Reactions  . Codeine Nausea And Vomiting    Patient Measurements: Height: 5' 3.5" (161.3 cm) Weight: 144 lb (65.318 kg) IBW/kg (Calculated) : 53.55  Vital Signs: Temp: 98.6 F (37 C) (06/14 0030) Temp src: Oral (06/14 0030) BP: 151/53 mmHg (06/14 0300) Pulse Rate: 82 (06/14 0300)  Labs:  Recent Labs  09/05/12 2021 09/06/12 0211  HGB 12.3  --   HCT 36.1  --   PLT 256  --   LABPROT  --  19.8*  INR  --  1.75*  CREATININE 0.83  --     Estimated Creatinine Clearance: 44.8 ml/min (by C-G formula based on Cr of 0.83).   Medical History: Past Medical History  Diagnosis Date  . Hypertension   . Hyperlipidemia   . Type 2 diabetes mellitus   . Arthritis   . Coronary artery disease     a. Mild nonobstructive by cath 04/2012, cath complicated by groin hematoma.  . S/P mitral valve repair 05/20/2012    a. Severe MR - 04/2012: 26mm Sorin Memo 3D ring annuloplasty via right mini thoracotomy approach  . S/P Maze operation for atrial fibrillation 05/20/2012    a. 04/2012: Complete bi-atrial lesion set using cryothermy  . Atrial fibrillation     a. Diagnosed 04/2012 in the setting of severe MR. b. s/p TEE/successful DCCV 06/06/12.  Marland Kitchen PEA (Pulseless electrical activity)     a. 04/2012 -> adm with afib, developed PEA, transferred to [facility] for prolonged hospitalization.   . Chronic diastolic CHF (congestive heart failure)     Medications:  See electronic med rec  Assessment: 79 y.o. female presents. Pt on coumadin PTA for afib. Baseline INR 1.75- subtherapeutic. Home dose 2mg  daily except for 1mg  on Sat and Sun. Last dose 6/13. No bleeding noted. CBC stable at baseline.  Goal of Therapy:  INR 2-3 Monitor platelets by anticoagulation protocol: Yes   Plan:  1) Daily INR 2) Coumadin 2mg  today  Christoper Fabian, PharmD, BCPS Clinical pharmacist, pager  [phone] 09/06/2012,3:33 AM

## 2012-09-06 NOTE — Consult Note (Addendum)
CARDIOLOGY CONSULT NOTE  Patient ID: [name] [name] MRN: [id number] DOB/AGE: February 21, 1925 79 y.o.  Admit date: 09/05/2012 Primary Physician Marylen Ponto, MD Primary Cardiologist Dr. Gala Romney Chief Complaint  Weakness  HPI:  The patient has a complicated recent cardiac history as below. She had PEA arrest in 2/14.  She subsequently was found to have severe MR with a normal EF.  She had MV repair with a ring annuloplasty and Maze.  She was cardioverted from atrial fib to NSR in March.  She has been on amiodarone and had bradycardia requiring discontinuation of lopressor in April.  She last saw Dr. Gala Romney on the 9th of this month complaining predominantly of dizziness.  She was orthostatic.  Her diuretic were held for 3 days.    She presents now with continued weakness.  Her family brought her to the ER as she was getting progressively weaker over the past week.  She has not been complaining however of shortness of breath although she hasn't been very active. She hasn't had any new PND or orthopnea. She's had a chronic cough that has been nonproductive. She's not describing any fevers or chills. She denies any dysuria or pyuria. Of palpitations. She does feel orthostatic symptoms. She's had anorexia really since her surgery. Said some mild ankle edema. In the emergency room she was treated with a liter of NS.  However, she continued to feel week. In addition ABG demonstrated PO2 of 55.  She did have many bacteria on her UA and was started on PO antibiotics.  CXR showed a small to moderate sized right pleural effusion.  We are called to assist with her care.    Lab Results  Component Value Date   TSH 3.598 09/01/2012     Past Medical History  Diagnosis Date  . Hypertension   . Hyperlipidemia   . Type 2 diabetes mellitus   . Arthritis   . Coronary artery disease     a. Mild nonobstructive by cath 04/2012, cath complicated by groin hematoma.  . S/P mitral valve repair 05/20/2012    a. Severe MR  - 04/2012: 26mm Sorin Memo 3D ring annuloplasty via right mini thoracotomy approach  . S/P Maze operation for atrial fibrillation 05/20/2012    a. 04/2012: Complete bi-atrial lesion set using cryothermy  . Atrial fibrillation     a. Diagnosed 04/2012 in the setting of severe MR. b. s/p TEE/successful DCCV 06/06/12.  Marland Kitchen PEA (Pulseless electrical activity)     a. 04/2012 -> adm with afib, developed PEA, transferred to [facility] for prolonged hospitalization.   . Chronic diastolic CHF (congestive heart failure)     Past Surgical History  Procedure Laterality Date  . Abdominal hysterectomy    . Cholecystectomy    . Eye surgery    . Appendectomy    . Tee without cardioversion N/A 05/09/2012    Procedure: TRANSESOPHAGEAL ECHOCARDIOGRAM (TEE);  Surgeon: Dolores Patty, MD;  Location: [facility] ENDOSCOPY;  Service: Cardiovascular;  Laterality: N/A;  . Mitral valve repair Right 05/20/2012    Procedure: MINIMALLY INVASIVE MITRAL VALVE REPAIR (MVR);  Surgeon: Purcell Nails, MD;  Location: [facility] OR;  Service: Open Heart Surgery;  Laterality: Right;  . Minimally invasive maze procedure N/A 05/20/2012    Procedure: MINIMALLY INVASIVE MAZE PROCEDURE;  Surgeon: Purcell Nails, MD;  Location: [facility] OR;  Service: Open Heart Surgery;  Laterality: N/A;  . Intraoperative transesophageal echocardiogram N/A 05/20/2012    Procedure: INTRAOPERATIVE TRANSESOPHAGEAL ECHOCARDIOGRAM;  Surgeon: Purcell Nails, MD;  Location: [facility] OR;  Service: Open Heart Surgery;  Laterality: N/A;  . Tee without cardioversion N/A 06/06/2012    Procedure: TRANSESOPHAGEAL ECHOCARDIOGRAM (TEE);  Surgeon: Dolores Patty, MD;  Location: [facility] ENDOSCOPY;  Service: Cardiovascular;  Laterality: N/A;  . Cardioversion N/A 06/06/2012    Procedure: CARDIOVERSION;  Surgeon: Dolores Patty, MD;  Location[facility] ENDOSCOPY;  Service: Cardiovascular;  Laterality: N/A;    Allergies  Allergen Reactions  . Codeine Nausea And Vomiting   Prior to Admission medications     Medication Sig Start Date End Date Taking? Authorizing Provider  amiodarone (PACERONE) 200 MG tablet Take 1 tablet (200 mg total) by mouth daily. 07/25/12  Yes Dolores Patty, MD  citalopram (CELEXA) 10 MG tablet Take 10 mg by mouth daily.   Yes Historical Provider, MD  glimepiride (AMARYL) 1 MG tablet Take 1 mg by mouth daily before breakfast.   Yes Historical Provider, MD  insulin glargine (LANTUS) 100 UNIT/ML injection Inject 18 Units into the skin at bedtime.   Yes Historical Provider, MD  magnesium oxide (MAG-OX) 400 MG tablet Take 1 tablet (400 mg total) by mouth 2 (two) times daily. 07/25/12  Yes Dolores Patty, MD  meclizine (ANTIVERT) 25 MG tablet Take 25 mg by mouth daily as needed for dizziness.   Yes Historical Provider, MD  metFORMIN (GLUCOPHAGE) 500 MG tablet Take 500 mg by mouth 2 (two) times daily with a meal.   Yes Historical Provider, MD  pravastatin (PRAVACHOL) 40 MG tablet Take 40 mg by mouth at bedtime. 07/25/12  Yes Bevelyn Buckles Bensimhon, MD  rOPINIRole (REQUIP) 0.5 MG tablet Take 0.5 mg by mouth at bedtime.   Yes Historical Provider, MD  traMADol (ULTRAM) 50 MG tablet Take 50-100 mg by mouth every 4 (four) hours as needed for pain. 05/27/12  Yes Donielle Margaretann Loveless, PA-C  traZODone (DESYREL) 50 MG tablet Take 50 mg by mouth at bedtime.   Yes Historical Provider, MD  warfarin (COUMADIN) 2 MG tablet Take 1-2 mg by mouth daily. Sat & Sun only-0.5 half tab (1 mg total); All other days 1 tab (2 mg total)   Yes Historical Provider, MD    (Not in a hospital admission) No family history on file.  History   Social History  . Marital Status: Widowed    Spouse Name: N/A    Number of Children: N/A  . Years of Education: N/A   Occupational History  . Not on file.   Social History Main Topics  . Smoking status: Never Smoker   . Smokeless tobacco: Never Used  . Alcohol Use: No  . Drug Use: No  . Sexually Active: Not on file   Other Topics Concern  . Not on file   Social  History Narrative  . No narrative on file     ROS:  As stated in the HPI and negative for all other systems.  Physical Exam: Blood pressure 152/50, pulse 81, temperature 98.6 F (37 C), temperature source Oral, resp. rate 24, SpO2 87.00%.  GENERAL:  Frail appearing HEENT:  Pupils equal round and reactive, fundi not visualized, oral mucosa unremarkable NECK:  JVD to jaw at 45 degrees, waveform within normal limits, carotid upstroke brisk and symmetric, no bruits, no thyromegaly LYMPHATICS:  No cervical, inguinal adenopathy LUNGS:  Breath sounds decreased on the right with dullness to percussion.   BACK:  No CVA tenderness CHEST:  Healed surgical scar.  HEART:  PMI not displaced or sustained,S1 and S2 within normal  limits, no S3, no S4, no clicks, no rubs, no murmurs, distant heart sounds ABD:  Flat, positive bowel sounds normal in frequency in pitch, no bruits, no rebound, no guarding, no midline pulsatile mass, no hepatomegaly, no splenomegaly EXT:  2 plus pulses throughout, trace ankle edema, no cyanosis no clubbing SKIN:  No rashes no nodules NEURO:  Cranial nerves II through XII grossly intact, motor grossly intact throughout PSYCH:  Cognitively intact, oriented to person place and time   Labs: Lab Results  Component Value Date   BUN 27* 09/05/2012   Lab Results  Component Value Date   CREATININE 0.83 09/05/2012   Lab Results  Component Value Date   NA 134* 09/05/2012   K 4.1 09/05/2012   CL 100 09/05/2012   CO2 27 09/05/2012    Lab Results  Component Value Date   WBC 6.7 09/05/2012   HGB 12.3 09/05/2012   HCT 36.1 09/05/2012   MCV 91.9 09/05/2012   PLT 256 09/05/2012    Lab Results  Component Value Date   ALT 21 09/05/2012   AST 29 09/05/2012   ALKPHOS 91 09/05/2012   BILITOT 0.8 09/05/2012     Radiology:CXR:  Small to moderate right pleural effusion with right lower lobe atelectasis or infiltrate. Small left pleural effusion with left basilar atelectasis. No  pulmonary edema.   EKG:  NSR, rate 74, poor anterior R wave progression, no acute ST T wave changes.   ASSESSMENT AND PLAN:   WEAKNESS:  TSH recently was normal.  I suspect that this is multifactorial with some orthostatic hypotension, possible sedation of her frail condition with urinary tract infection. Probably has some degree of malnutrition.  HYPOXEMIA:  Comparing the CXR with one done in April the right sided effusion is slightly increased.  We can consider thoracentesis.  I will discuss this with Dr. Gala Romney in the AM.  I agree however currently withholding her diuresis given her orthostasis.  I will repeat a transthoracic echocardiogram.  Of note she did have preoperative PFTs although I cannot find the final reading.    ATRIAL FIBRILLATION:  She is maintaining sinus rhythm. I do not strongly suspect amiodarone toxicity is etiology to her dyspnea. For now I will suggest continuing her oral amiodarone.  UTI:  Treatment per the primary team.    Signed: Rollene Rotunda 09/06/2012, 2:07 AM

## 2012-09-06 NOTE — H&P (Addendum)
[facility] History and Physical  [name] ZOX:[id number] DOB: [birth year] DOA: 09/05/2012  Referring physician: EDP PCP: Marylen Ponto, MD  Specialists:   Chief Complaint: Weakness  HPI: [name] [name] is a 79 y.o. female with chronic diastolic CHF who presents to the ED with complaints of worsening weakness fatigue and malaise X 1 week.   She was also found to have hypoxemia in the ED with O2 saturations in the 80's.  She was evaluated by her Cardiologist for her CHF and was taken off of her lasix 5 days ago due to dehydration and was instructed to resume her lasix once she had gained 5 pounds.  She reports having anorexia, and denies having any fevers or chills or nausea or vomiting or diarrhea.     In the ED she was found to have an right pleural Effusion with a RLL infiltrate.   She was also found to have a UTI.     Review of Systems: The patient denies fever, chills, headaches, weight loss, vision loss, diplopia, dizziness, decreased hearing, rhinitis, hoarseness, chest pain, syncope, dyspnea on exertion, peripheral edema, balance deficits, cough, hemoptysis, abdominal pain, nausea, vomiting, diarrhea, constipation, hematemesis, melena, hematochezia, severe indigestion/heartburn, dysuria, hematuria, incontinence, suspicious skin lesions, transient blindness, difficulty walking, depression, unusual weight change, abnormal bleeding, enlarged lymph nodes, angioedema, and breast masses.    Past Medical History  Diagnosis Date  . Hypertension   . Hyperlipidemia   . Type 2 diabetes mellitus   . Arthritis   . Coronary artery disease     a. Mild nonobstructive by cath 04/2012, cath complicated by groin hematoma.  . S/P mitral valve repair 05/20/2012    a. Severe MR - 04/2012: 26mm Sorin Memo 3D ring annuloplasty via right mini thoracotomy approach  . S/P Maze operation for atrial fibrillation 05/20/2012    a. 04/2012: Complete bi-atrial lesion set using cryothermy  . Atrial fibrillation      a. Diagnosed 04/2012 in the setting of severe MR. b. s/p TEE/successful DCCV 06/06/12.  Marland Kitchen PEA (Pulseless electrical activity)     a. 04/2012 -> adm with afib, developed PEA, transferred to [facility] for prolonged hospitalization.   . Chronic diastolic CHF (congestive heart failure)     Past Surgical History  Procedure Laterality Date  . Abdominal hysterectomy    . Cholecystectomy    . Eye surgery    . Appendectomy    . Tee without cardioversion N/A 05/09/2012    Procedure: TRANSESOPHAGEAL ECHOCARDIOGRAM (TEE);  Surgeon: Dolores Patty, MD;  Location: [facility] ENDOSCOPY;  Service: Cardiovascular;  Laterality: N/A;  . Mitral valve repair Right 05/20/2012    Procedure: MINIMALLY INVASIVE MITRAL VALVE REPAIR (MVR);  Surgeon: Purcell Nails, MD;  Location: [facility] OR;  Service: Open Heart Surgery;  Laterality: Right;  . Minimally invasive maze procedure N/A 05/20/2012    Procedure: MINIMALLY INVASIVE MAZE PROCEDURE;  Surgeon: Purcell Nails, MD;  Location: [facility] OR;  Service: Open Heart Surgery;  Laterality: N/A;  . Intraoperative transesophageal echocardiogram N/A 05/20/2012    Procedure: INTRAOPERATIVE TRANSESOPHAGEAL ECHOCARDIOGRAM;  Surgeon: Purcell Nails, MD;  Location: [facility] OR;  Service: Open Heart Surgery;  Laterality: N/A;  . Tee without cardioversion N/A 06/06/2012    Procedure: TRANSESOPHAGEAL ECHOCARDIOGRAM (TEE);  Surgeon: Dolores Patty, MD;  Location: [facility] ENDOSCOPY;  Service: Cardiovascular;  Laterality: N/A;  . Cardioversion N/A 06/06/2012    Procedure: CARDIOVERSION;  Surgeon: Dolores Patty, MD;  Location: [facility] ENDOSCOPY;  Service: Cardiovascular;  Laterality: N/A;  Prior to Admission medications   Medication Sig Start Date End Date Taking? Authorizing Provider  amiodarone (PACERONE) 200 MG tablet Take 1 tablet (200 mg total) by mouth daily. 07/25/12  Yes Dolores Patty, MD  citalopram (CELEXA) 10 MG tablet Take 10 mg by mouth daily.   Yes Historical Provider, MD  glimepiride (AMARYL) 1 MG  tablet Take 1 mg by mouth daily before breakfast.   Yes Historical Provider, MD  insulin glargine (LANTUS) 100 UNIT/ML injection Inject 18 Units into the skin at bedtime.   Yes Historical Provider, MD  magnesium oxide (MAG-OX) 400 MG tablet Take 1 tablet (400 mg total) by mouth 2 (two) times daily. 07/25/12  Yes Dolores Patty, MD  meclizine (ANTIVERT) 25 MG tablet Take 25 mg by mouth daily as needed for dizziness.   Yes Historical Provider, MD  metFORMIN (GLUCOPHAGE) 500 MG tablet Take 500 mg by mouth 2 (two) times daily with a meal.   Yes Historical Provider, MD  pravastatin (PRAVACHOL) 40 MG tablet Take 40 mg by mouth at bedtime. 07/25/12  Yes Bevelyn Buckles Bensimhon, MD  rOPINIRole (REQUIP) 0.5 MG tablet Take 0.5 mg by mouth at bedtime.   Yes Historical Provider, MD  traMADol (ULTRAM) 50 MG tablet Take 50-100 mg by mouth every 4 (four) hours as needed for pain. 05/27/12  Yes Donielle Margaretann Loveless, PA-C  traZODone (DESYREL) 50 MG tablet Take 50 mg by mouth at bedtime.   Yes Historical Provider, MD  warfarin (COUMADIN) 2 MG tablet Take 1-2 mg by mouth daily. Sat & Sun only-0.5 half tab (1 mg total); All other days 1 tab (2 mg total)   Yes Historical Provider, MD    Allergies  Allergen Reactions  . Codeine Nausea And Vomiting    Social History:  reports that she has never smoked. She has never used smokeless tobacco. She reports that she does not drink alcohol or use illicit drugs.     Family History  Problem Relation Age of Onset  . CAD Father 76    Died of MI  . CAD Brother 105    Died of MI  . CAD Brother 70    Died of MI  . CAD Mother 75    Died of MI    (be sure to complete)   Physical Exam:  GEN:  Pleasant   Elderly Obese 79 y.o. Caucasian female  examined  and in no acute distress; cooperative with exam Filed Vitals:   09/06/12 0130 09/06/12 0200 09/06/12 0230 09/06/12 0300  BP: 152/50 154/52 149/50 151/53  Pulse: 81 77 85 82  Temp:      TempSrc:      Resp: 24 20 18     Height:    5' 3.5" (1.613 m)  Weight:    65.318 kg (144 lb)  SpO2: 87% 91% 86% 88%   Blood pressure 151/53, pulse 82, temperature 98.6 F (37 C), temperature source Oral, resp. rate 18, height 5' 3.5" (1.613 m), weight 65.318 kg (144 lb), SpO2 88.00%. PSYCH: She is alert and oriented x4; does not appear anxious does not appear depressed; affect is normal HEENT: Normocephalic and Atraumatic, Mucous membranes pink; PERRLA; EOM intact; Fundi:  Benign;  No scleral icterus, Nares: Patent, Oropharynx: Clear, Fair Dentition, Neck:  FROM, no cervical lymphadenopathy nor thyromegaly or carotid bruit; no JVD; Breasts:: Not examined CHEST WALL: No tenderness CHEST: Normal respiration, clear to auscultation bilaterally HEART: Regular rate and rhythm; no murmurs rubs or gallops BACK: No kyphosis or  scoliosis; no CVA tenderness ABDOMEN: Positive Bowel Sounds, Obese, soft non-tender; no masses, no organomegaly. Rectal Exam: Not done EXTREMITIES: No cyanosis, clubbing or edema; no ulcerations. Genitalia: not examined PULSES: 2+ and symmetric SKIN: Normal hydration no rash or ulceration CNS: Cranial nerves 2-12 grossly intact no focal neurologic deficit    Labs on Admission:  Basic Metabolic Panel:  Recent Labs Lab 09/01/12 1048 09/05/12 2021  NA 136 134*  K 4.5 4.1  CL 97 100  CO2 29 27  GLUCOSE 119* 192*  BUN 32* 27*  CREATININE 1.01 0.83  CALCIUM 10.6* 9.6   Liver Function Tests:  Recent Labs Lab 09/05/12 2021  AST 29  ALT 21  ALKPHOS 91  BILITOT 0.8  PROT 6.4  ALBUMIN 3.2*   No results found for this basename: LIPASE, AMYLASE,  in the last 168 hours No results found for this basename: AMMONIA,  in the last 168 hours CBC:  Recent Labs Lab 09/01/12 1048 09/05/12 2021  WBC 7.1 6.7  NEUTROABS  --  5.1  HGB 13.0 12.3  HCT 38.6 36.1  MCV 93.0 91.9  PLT 292 256   Cardiac Enzymes: No results found for this basename: CKTOTAL, CKMB, CKMBINDEX, TROPONINI,  in the last 168  hours  BNP (last 3 results)  Recent Labs  05/06/12 0229 05/13/12 0535 09/06/12 0302  PROBNP 3044.0* 1125.0* 3823.0*   CBG: No results found for this basename: GLUCAP,  in the last 168 hours  Radiological Exams on Admission: Dg Chest 2 View  09/05/2012   *RADIOLOGY REPORT*  Clinical Data: fatigue  CHEST - 2 VIEW  Comparison: 09/01/2012  Findings: Borderline cardiomegaly.  Small to moderate right pleural effusion with right lower lobe atelectasis or infiltrate.  Small left pleural effusion with left basilar atelectasis.  No pulmonary edema.  IMPRESSION: Small to moderate right pleural effusion with right lower lobe atelectasis or infiltrate.  Small left pleural effusion with left basilar atelectasis.  No pulmonary edema.   Original Report Authenticated By: Natasha Mead, M.D.     EKG: Independently reviewed. Sinus rhythm, No Acute S-T changes.      Assessment/Plan Principal Problem:   Pleural effusion Active Problems:   Hypoxemia   Diabetes type 2, uncontrolled   Chronic diastolic heart failure   UTI (lower urinary tract infection)      1.  Pleural Effusion (Right)-  Due to CHF, CHF protocol, and diurese with IV lasix.     2. Hypoxemia- due to #1.  O2 PRN.    3.  Chronic diastolic CHF-  Diurese with IV lasix, CHF protocol.  Notify Coverage for Dr. Clarise Cruz of admission.    4.  UTI- Urine C+S sent, Administered PO Ceftin in ED.     5.  Coagulopathy due to Warfarin Rx-  Coumadin management per pharmacy.     6.  DM2- continue Lantus Insulin, and Add SSI coverage PRN.      Code Status:      FULL CODE Family Communication:    Daughter and Son at Bedside Disposition Plan:     Return to Home on Discharge  Time spent: 61 Minutes  Ron Parker [facility] Pager [phone]  If 7PM-7AM, please contact night-coverage [email or web address] Password [facility] 09/06/2012, 4:44 AM

## 2012-09-06 NOTE — ED Notes (Signed)
Dr. Jenkins at bedside. 

## 2012-09-06 NOTE — ED Notes (Addendum)
O2 via nasal cannula d/c per Dr. Ethelda Chick for trial to have pt ambulate.  Sats 86-90% without O2.  Pt denies SOB.  Non-productive cough present.  Family member at bedside.  Pt stood up and walked a few steps in room and states she feels better.  Pt uses a walker at home.  ABG ordered.  Pt sitting on side of bed and states she wants to sit up a while.  Family member instructed to let staff know if they step out so that pt is not sitting on bed by herself.

## 2012-09-06 NOTE — Progress Notes (Signed)
[facility] Progress Note [place] TEAM 1 - Stepdown/ICU TEAM   [name] [name] ONG:[id number] DOB: October 03, 1924 DOA: 09/05/2012 PCP: Marylen Ponto, MD  Brief narrative: 79 y.o. female with chronic diastolic CHF who presented to the ED with complaints of worsening weakness, fatigue, and malaise X 1 week. She was found to have hypoxemia in the ED with O2 saturations in the 80's. She was evaluated by her Cardiologist for CHF and was taken off of her lasix 5 days previously due to dehydration and was instructed to resume her lasix once she had gained 5 pounds. She reported having anorexia, and denied having any fevers or chills or nausea or vomiting or diarrhea.  In the ED she was found to have a right pleural Effusion with a RLL infiltrate. She was also found to have a UTI.   Assessment/Plan:  R Pleural effusion  ?RLL PNA WBC not elevated - pt afebrile - doubt PNA  Acute hypoxic resp failure  Diabetes type 2, uncontrolled in the  Chronic diastolic heart failure Wt 144 lb (65.318 kg) at f/u TCTS visit 09/01/2012 - wgt 65.3kg at time of admit  UTI   HLD  HTN Poorly controlled at present   Hx of PEA arrest Feb 2014   Mild nonobstructive CAD via cardiac cath Feb 2014  Severe MR w/p minimally invasive and MVR Feb 2014  Hx of afib s/p Maze Feb 2014 w/ subsequent recurrence s/p TEE/DCC 06/06/12  Normocytic anemia   MRSA screen +  Code Status: FULL Family Communication:  Disposition Plan: SDU  Consultants: Cardiology  Procedures: none  Antibiotics: Rocephin 6/13 >> Azithromycin 6/14>> Cefuroxime 6/13  DVT prophylaxis: lovenox >> warfarin  HPI/Subjective: Pt seen for f/u visit   Objective: Blood pressure 158/64, pulse 88, temperature 98.5 F (36.9 C), temperature source Oral, resp. rate 19, height 5' 3.5" (1.613 m), weight 65.318 kg (144 lb), SpO2 91.00%.  Intake/Output Summary (Last 24 hours) at 09/06/12 1354 Last data filed at 09/06/12 1113  Gross per 24  hour  Intake      0 ml  Output    202 ml  Net   -202 ml   Exam: F/U exam completed  Data Reviewed: Basic Metabolic Panel:  Recent Labs Lab 09/01/12 1048 09/05/12 2021 09/06/12 0550  NA 136 134* 136  K 4.5 4.1 3.7  CL 97 100 103  CO2 29 27 23   GLUCOSE 119* 192* 108*  BUN 32* 27* 24*  CREATININE 1.01 0.83 0.83  CALCIUM 10.6* 9.6 8.9   Liver Function Tests:  Recent Labs Lab 09/05/12 2021  AST 29  ALT 21  ALKPHOS 91  BILITOT 0.8  PROT 6.4  ALBUMIN 3.2*   CBC:  Recent Labs Lab 09/01/12 1048 09/05/12 2021 09/06/12 0550  WBC 7.1 6.7 6.2  NEUTROABS  --  5.1  --   HGB 13.0 12.3 11.9*  HCT 38.6 36.1 35.7*  MCV 93.0 91.9 92.5  PLT 292 256 250   Cardiac Enzymes: No results found for this basename: CKTOTAL, CKMB, CKMBINDEX, TROPONINI,  in the last 168 hours BNP (last 3 results)  Recent Labs  05/06/12 0229 05/13/12 0535 09/06/12 0302  PROBNP 3044.0* 1125.0* 3823.0*   CBG:  Recent Labs Lab 09/06/12 0731 09/06/12 1104  GLUCAP 146* 139*    Recent Results (from the past 240 hour(s))  MRSA PCR SCREENING     Status: Abnormal   Collection Time    09/06/12  4:52 AM      Result Value Range Status  MRSA by PCR POSITIVE (*) NEGATIVE Final   Comment:            The GeneXpert MRSA Assay (FDA     approved for NASAL specimens     only), is one component of a     comprehensive MRSA colonization     surveillance program. It is not     intended to diagnose MRSA     infection nor to guide or     monitor treatment for     MRSA infections.     RESULT CALLED TO, READ BACK BY AND VERIFIED WITH:     LINDSAY J.,RN 09/06/12 0818 BY JONESJ     Studies:  Recent x-ray studies have been reviewed in detail by the Attending Physician  Scheduled Meds:  Scheduled Meds: . amiodarone  200 mg Oral Daily  . aspirin EC  325 mg Oral Daily  . azithromycin  500 mg Oral Daily  . carvedilol  3.125 mg Oral BID WC  . cefTRIAXone (ROCEPHIN)  IV  1 g Intravenous Q24H  .  Chlorhexidine Gluconate Cloth  6 each Topical Q0600  . citalopram  10 mg Oral Daily  . furosemide  40 mg Oral BID  . insulin aspart  0-5 Units Subcutaneous QHS  . insulin aspart  0-9 Units Subcutaneous TID WC  . insulin glargine  18 Units Subcutaneous QHS  . lisinopril  2.5 mg Oral Daily  . magnesium oxide  400 mg Oral BID  . mupirocin ointment  1 application Nasal BID  . potassium chloride  20 mEq Oral BID  . rOPINIRole  0.5 mg Oral QHS  . simvastatin  20 mg Oral q1800  . sodium chloride  3 mL Intravenous Q12H  . sodium chloride  3 mL Intravenous Q12H  . warfarin  2 mg Oral ONCE-1800  . Warfarin - Pharmacist Dosing Inpatient   Does not apply q1800    Time spent on care of this patient: 25+ mins   [facility] T  [facility] Office  [phone] Pager - Text Page per Loretha Stapler as per below:  On-Call/Text Page:      [email or web address]      password [email or web address]  If 7PM-7AM, please contact night-coverage [email or web address] Password [email or web address] 09/06/2012, 1:54 PM   LOS: 1 day

## 2012-09-06 NOTE — Progress Notes (Signed)
Late entry.Pt transferred from ED.Family members at the bedside.Alert x4 no c/o pain , skin dry and intact placed on 3L [place] 02 in low 80's will monitor.

## 2012-09-06 NOTE — Progress Notes (Signed)
Patient Name: [name] [name]      SUBJECTIVE: admitted with weakness and orthostasis and found also to be hypoxic:  cXR some effusion on R with ? Infiltrate worse over week./  UA was abnormal and ABX started.  Still weak and moaning, short of breath  PEA arrest in 2/14 She was transferred to [facility]. Found to have new onset AF with RVR with normal EF and severe MR due to annular dilation. She underwent heart cath which showed non-obstructive CAD. Unfortunately this was complicated by a large left groin bleed. She eventually underwent minimally Invasive Mitral Valve Repair utilizing a 26 mm Sorin Memo 3D ring for annuloplasty and a MAZE Procedure on 05/20/12. Was discharged on 05/27/2012. Aflutter s/p DCCV and amio decreased subsequently 2/2 bradycardia  Past Medical History  Diagnosis Date  . Hypertension   . Hyperlipidemia   . Type 2 diabetes mellitus   . Arthritis   . Coronary artery disease     a. Mild nonobstructive by cath 04/2012, cath complicated by groin hematoma.  . S/P mitral valve repair 05/20/2012    a. Severe MR - 04/2012: 26mm Sorin Memo 3D ring annuloplasty via right mini thoracotomy approach  . S/P Maze operation for atrial fibrillation 05/20/2012    a. 04/2012: Complete bi-atrial lesion set using cryothermy  . Atrial fibrillation     a. Diagnosed 04/2012 in the setting of severe MR. b. s/p TEE/successful DCCV 06/06/12.  Marland Kitchen PEA (Pulseless electrical activity)     a. 04/2012 -> adm with afib, developed PEA, transferred to [facility] for prolonged hospitalization.   . Chronic diastolic CHF (congestive heart failure)     PHYSICAL EXAM Filed Vitals:   09/06/12 0230 09/06/12 0300 09/06/12 0731 09/06/12 0827  BP: 149/50 151/53 158/64   Pulse: 85 82 88   Temp:    98.9 F (37.2 C)  TempSrc:    Oral  Resp: 18  19   Height:  5' 3.5" (1.613 m)    Weight:  144 lb (65.318 kg)    SpO2: 86% 88% 91%     Well developed and nourished in mild resp distress HENT normal Neck supple     Clear  laterally Regular rate and rhythm, no murmurs or gallops Abd-soft with active BS without hepatomegaly No Clubbing cyanosis edema Skin-warm and dry A & Oriented  Grossly normal sensory and motor function  TELEMETRY: Reviewed telemetry pt in nsr:    Intake/Output Summary (Last 24 hours) at 09/06/12 0901 Last data filed at 09/06/12 0530  Gross per 24 hour  Intake      0 ml  Output    200 ml  Net   -200 ml    LABS: Basic Metabolic Panel:  Recent Labs Lab 09/01/12 1048 09/05/12 2021 09/06/12 0550  NA 136 134* 136  K 4.5 4.1 3.7  CL 97 100 103  CO2 29 27 23   GLUCOSE 119* 192* 108*  BUN 32* 27* 24*  CREATININE 1.01 0.83 0.83  CALCIUM 10.6* 9.6 8.9   Cardiac Enzymes: No results found for this basename: CKTOTAL, CKMB, CKMBINDEX, TROPONINI,  in the last 72 hours CBC:  Recent Labs Lab 09/01/12 1048 09/05/12 2021 09/06/12 0550  WBC 7.1 6.7 6.2  NEUTROABS  --  5.1  --   HGB 13.0 12.3 11.9*  HCT 38.6 36.1 35.7*  MCV 93.0 91.9 92.5  PLT 292 256 250   PROTIME:  Recent Labs  09/06/12 0211  LABPROT 19.8*  INR 1.75*   Liver Function Tests:  Recent Labs  09/05/12 2021  AST 29  ALT 21  ALKPHOS 91  BILITOT 0.8  PROT 6.4  ALBUMIN 3.2*   No results found for this basename: LIPASE, AMYLASE,  in the last 72 hours BNP: BNP (last 3 results)  Recent Labs  05/06/12 0229 05/13/12 0535 09/06/12 0302  PROBNP 3044.0* 1125.0* 3823.0*   ecg without significant abnormality  ASSESSMENT AND PLAN:  Principal Problem:   Pleural effusion Active Problems:   Diabetes type 2, uncontrolled   Chronic diastolic heart failure   Hypoxemia   UTI (lower urinary tract infection)  ? Pneumonia Continue diuresis Will check d dimer   Signed, Sherryl Manges MD  09/06/2012

## 2012-09-06 NOTE — ED Notes (Signed)
Pt placed back on 2L O2 via [place].

## 2012-09-07 LAB — PROTIME-INR
INR: 2.34 — ABNORMAL HIGH (ref 0.00–1.49)
Prothrombin Time: 24.6 seconds — ABNORMAL HIGH (ref 11.6–15.2)

## 2012-09-07 LAB — GLUCOSE, CAPILLARY
Glucose-Capillary: 146 mg/dL — ABNORMAL HIGH (ref 70–99)
Glucose-Capillary: 104 mg/dL — ABNORMAL HIGH (ref 70–99)

## 2012-09-07 LAB — BASIC METABOLIC PANEL
Chloride: 100 mEq/L (ref 96–112)
Creatinine, Ser: 0.85 mg/dL (ref 0.50–1.10)
GFR calc Af Amer: 70 mL/min — ABNORMAL LOW (ref >90)
Sodium: 135 mEq/L (ref 135–145)

## 2012-09-07 LAB — RETICULOCYTES
RBC.: 3.71 MIL/uL — ABNORMAL LOW (ref 3.87–5.11)
Retic Ct Pct: 3.8 % — ABNORMAL HIGH (ref 0.4–3.1)

## 2012-09-07 LAB — CBC
MCH: 30.5 pg (ref 26.0–34.0)
MCHC: 33 g/dL (ref 30.0–36.0)
Platelets: 255 10*3/uL (ref 150–400)
RBC: 3.71 MIL/uL — ABNORMAL LOW (ref 3.87–5.11)

## 2012-09-07 LAB — IRON AND TIBC: TIBC: 293 ug/dL (ref 250–470)

## 2012-09-07 LAB — VITAMIN B12: Vitamin B-12: 279 pg/mL (ref 211–911)

## 2012-09-07 MED ORDER — WARFARIN SODIUM 1 MG PO TABS
1.0000 mg | ORAL_TABLET | Freq: Once | ORAL | Status: AC
  Administered 2012-09-07: 1 mg via ORAL
  Filled 2012-09-07: qty 1

## 2012-09-07 MED ORDER — CYANOCOBALAMIN 1000 MCG/ML IJ SOLN
1000.0000 ug | Freq: Every day | INTRAMUSCULAR | Status: AC
  Administered 2012-09-07 – 2012-09-11 (×5): 1000 ug via SUBCUTANEOUS
  Filled 2012-09-07 (×6): qty 1

## 2012-09-07 NOTE — Progress Notes (Signed)
Patient Name: [name] [name]      SUBJECTIVE: admitted with weakness and orthostasis and found also to be hypoxic:  cXR some effusion on R with ? Infiltrate worse over week./  UA was abnormal and ABX started.   MUCH MUCH better today than yesterday  PEA arrest in 2/14 She was transferred to [facility]. Found to have new onset AF with RVR with normal EF and severe MR due to annular dilation. She underwent heart cath which showed non-obstructive CAD. Unfortunately this was complicated by a large left groin bleed. She eventually underwent minimally Invasive Mitral Valve Repair utilizing a 26 mm Sorin Memo 3D ring for annuloplasty and a MAZE Procedure on 05/20/12. Was discharged on 05/27/2012. Aflutter s/p DCCV and amio decreased subsequently 2/2 bradycardia  Past Medical History  Diagnosis Date  . Hypertension   . Hyperlipidemia   . Type 2 diabetes mellitus   . Arthritis   . Coronary artery disease     a. Mild nonobstructive by cath 04/2012, cath complicated by groin hematoma.  . S/P mitral valve repair 05/20/2012    a. Severe MR - 04/2012: 26mm Sorin Memo 3D ring annuloplasty via right mini thoracotomy approach  . S/P Maze operation for atrial fibrillation 05/20/2012    a. 04/2012: Complete bi-atrial lesion set using cryothermy  . Atrial fibrillation     a. Diagnosed 04/2012 in the setting of severe MR. b. s/p TEE/successful DCCV 06/06/12.  Marland Kitchen PEA (Pulseless electrical activity)     a. 04/2012 -> adm with afib, developed PEA, transferred to [facility] for prolonged hospitalization.   . Chronic diastolic CHF (congestive heart failure)     PHYSICAL EXAM Filed Vitals:   09/07/12 0406 09/07/12 0436 09/07/12 0825 09/07/12 0828  BP: 118/87  100/74   Pulse: 81  84   Temp: 98.4 F (36.9 C)   98.5 F (36.9 C)  TempSrc: Oral   Oral  Resp: 18  27   Height:      Weight:  147 lb 0.8 oz (66.7 kg)    SpO2: 95%  87%    NAD Alert  RRR   TELEMETRY: Reviewed telemetry pt in nsr:    Intake/Output Summary (Last 24  hours) at 09/07/12 1005 Last data filed at 09/07/12 0504  Gross per 24 hour  Intake    700 ml  Output      9 ml  Net    691 ml    LABS: Basic Metabolic Panel:  Recent Labs Lab 09/01/12 1048 09/05/12 2021 09/06/12 0550 09/07/12 0355  NA 136 134* 136 135  K 4.5 4.1 3.7 3.7  CL 97 100 103 100  CO2 29 27 23 24   GLUCOSE 119* 192* 108* 107*  BUN 32* 27* 24* 19  CREATININE 1.01 0.83 0.83 0.85  CALCIUM 10.6* 9.6 8.9 8.7   Cardiac Enzymes: No results found for this basename: CKTOTAL, CKMB, CKMBINDEX, TROPONINI,  in the last 72 hours CBC:  Recent Labs Lab 09/01/12 1048 09/05/12 2021 09/06/12 0550 09/07/12 0355  WBC 7.1 6.7 6.2 6.2  NEUTROABS  --  5.1  --   --   HGB 13.0 12.3 11.9* 11.3*  HCT 38.6 36.1 35.7* 34.2*  MCV 93.0 91.9 92.5 92.2  PLT 292 256 250 255   PROTIME:  Recent Labs  09/06/12 0211 09/07/12 0355  LABPROT 19.8* 24.6*  INR 1.75* 2.34*   Liver Function Tests:  Recent Labs  09/05/12 2021  AST 29  ALT 21  ALKPHOS 91  BILITOT 0.8  PROT 6.4  ALBUMIN 3.2*   No results found for this basename: LIPASE, AMYLASE,  in the last 72 hours BNP: BNP (last 3 results)  Recent Labs  05/06/12 0229 05/13/12 0535 09/06/12 0302  PROBNP 3044.0* 1125.0* 3823.0*   ecg without significant abnormality  ASSESSMENT AND PLAN:  Principal Problem:   Pleural effusion Active Problems:   Diabetes type 2, uncontrolled   Chronic diastolic heart failure   Hypoxemia   UTI (lower urinary tract infection)  ? Pneumonia Continue diuresis D dimer normal Confusion adn weakness seems to be markedly improved with treatment of UTI    Signed, Sherryl Manges MD  09/07/2012

## 2012-09-07 NOTE — Progress Notes (Addendum)
[facility] Progress Note [place] TEAM 1 - Stepdown/ICU TEAM   [name] [name] ZOX:[id number] DOB: [birth year] DOA: 09/05/2012 PCP: Marylen Ponto, MD  Brief narrative: 79 y.o. female with chronic diastolic CHF who presented to the ED with complaints of worsening weakness, fatigue, and malaise X 1 week. She was found to have hypoxemia in the ED with O2 saturations in the 80's. She was evaluated by her Cardiologist for CHF and was taken off of her lasix 5 days previously due to dehydration and was instructed to resume her lasix once she had gained 5 pounds. She reported having anorexia, and denied having any fevers or chills or nausea or vomiting or diarrhea.   In the ED she was found to have a right pleural Effusion with a possible RLL infiltrate. She was also found to have a UTI.   Assessment/Plan:  R Pleural effusion w/ acute hypoxic resp failure Review of x-rays suggest that this has been slowly progressive since 09/01/2012 - accurate assessment of volume is complicated by the fact the patient has a chronically elevated R hemidiaphragm - there is no other clear etiology to her hypoxia at this time (patient does not normally require oxygen at home) - ask heart failure team to comment on possible thoracentesis  ?RLL PNA WBC not elevated - pt afebrile - doubt PNA   Diabetes type 2, uncontrolled  CBG reasonably controlled at this time - check A1c  Chronic diastolic heart failure Wt 144 lb (65.318 kg) at f/u TCTS visit 09/01/2012 - wgt 65.3kg at time of admit, and currently trending upward - diuretic dosing per cardiology  Severe MR s/p minimally invasive MVR Feb 2014 Echocardiogram pending to evaluate valve - will formally consult TCTS if echo raises concern for valve issue  UTI  Awaiting culture data - continue empiric antibiotic therapy  HLD Continue Zocor  HTN Blood pressure reasonably well controlled at present - follow without change  Hx of PEA arrest Feb 2014   Mild  nonobstructive CAD via cardiac cath Feb 2014  Hx of afib s/p Maze Feb 2014 w/ subsequent recurrence s/p TEE/DCC 06/06/12  Normocytic anemia  Anemia panel reveals mild iron deficiency - initiate iron replacement once patient more ambulatory  MRSA screen + Usual contact precautions  Code Status: FULL Family Communication: No family present at time of visit today Disposition Plan: Stable for transfer to telemetry bed  Consultants: Cardiology  Procedures: none  Antibiotics: Rocephin 6/13 >> Azithromycin 6/14>>6/15 Cefuroxime 6/13  DVT prophylaxis: lovenox >> warfarin  HPI/Subjective: The patient is awake alert and oriented.  She has no specific complaints at this time.  She states she feels "much better than yesterday."  She denies chest pain fevers chills nausea or vomiting.  Objective: Blood pressure 147/47, pulse 81, temperature 98.4 F (36.9 C), temperature source Oral, resp. rate 23, height 5' 3.5" (1.613 m), weight 66.7 kg (147 lb 0.8 oz), SpO2 90.00%.  Intake/Output Summary (Last 24 hours) at 09/07/12 1515 Last data filed at 09/07/12 1400  Gross per 24 hour  Intake   1180 ml  Output      7 ml  Net   1173 ml   Exam: General: No acute respiratory distress at rest but still requiring 4 L nasal cannula oxygen Lungs: Decreased breath sounds right base with good air movement throughout other fields with no wheeze Cardiovascular: Regular rate and rhythm without murmur gallop or rub appreciable Abdomen: Nontender, nondistended, soft, bowel sounds positive, no rebound, no ascites, no appreciable mass Extremities: No  significant cyanosis, clubbing, or edema bilateral lower extremities   Data Reviewed: Basic Metabolic Panel:  Recent Labs Lab 09/01/12 1048 09/05/12 2021 09/06/12 0550 09/07/12 0355  NA 136 134* 136 135  K 4.5 4.1 3.7 3.7  CL 97 100 103 100  CO2 29 27 23 24   GLUCOSE 119* 192* 108* 107*  BUN 32* 27* 24* 19  CREATININE 1.01 0.83 0.83 0.85  CALCIUM  10.6* 9.6 8.9 8.7   Liver Function Tests:  Recent Labs Lab 09/05/12 2021  AST 29  ALT 21  ALKPHOS 91  BILITOT 0.8  PROT 6.4  ALBUMIN 3.2*   CBC:  Recent Labs Lab 09/01/12 1048 09/05/12 2021 09/06/12 0550 09/07/12 0355  WBC 7.1 6.7 6.2 6.2  NEUTROABS  --  5.1  --   --   HGB 13.0 12.3 11.9* 11.3*  HCT 38.6 36.1 35.7* 34.2*  MCV 93.0 91.9 92.5 92.2  PLT 292 256 250 255   BNP (last 3 results)  Recent Labs  05/06/12 0229 05/13/12 0535 09/06/12 0302  PROBNP 3044.0* 1125.0* 3823.0*   CBG:  Recent Labs Lab 09/06/12 1104 09/06/12 1814 09/06/12 2126 09/07/12 0823 09/07/12 1156  GLUCAP 139* 115* 123* 106* 146*    Recent Results (from the past 240 hour(s))  MRSA PCR SCREENING     Status: Abnormal   Collection Time    09/06/12  4:52 AM      Result Value Range Status   MRSA by PCR POSITIVE (*) NEGATIVE Final   Comment:            The GeneXpert MRSA Assay (FDA     approved for NASAL specimens     only), is one component of a     comprehensive MRSA colonization     surveillance program. It is not     intended to diagnose MRSA     infection nor to guide or     monitor treatment for     MRSA infections.     RESULT CALLED TO, READ BACK BY AND VERIFIED WITH:     LINDSAY J.,RN 09/06/12 0818 BY JONESJ     Studies:  Recent x-ray studies have been reviewed in detail by the Attending Physician  Scheduled Meds:  Scheduled Meds: . amiodarone  200 mg Oral Daily  . aspirin EC  325 mg Oral Daily  . azithromycin  500 mg Oral Daily  . carvedilol  3.125 mg Oral BID WC  . cefTRIAXone (ROCEPHIN)  IV  1 g Intravenous Q24H  . Chlorhexidine Gluconate Cloth  6 each Topical Q0600  . citalopram  10 mg Oral Daily  . cyanocobalamin  1,000 mcg Subcutaneous Q1500  . furosemide  40 mg Oral BID  . insulin aspart  0-5 Units Subcutaneous QHS  . insulin aspart  0-9 Units Subcutaneous TID WC  . insulin glargine  18 Units Subcutaneous QHS  . lisinopril  2.5 mg Oral Daily  .  magnesium oxide  400 mg Oral BID  . mupirocin ointment  1 application Nasal BID  . potassium chloride  20 mEq Oral BID  . rOPINIRole  0.5 mg Oral QHS  . simvastatin  20 mg Oral q1800  . warfarin  1 mg Oral ONCE-1800  . Warfarin - Pharmacist Dosing Inpatient   Does not apply q1800    Time spent on care of this patient: 35 mins   [facility] T  [facility] Office  [phone] Pager - Text Page per Loretha Stapler as per below:  On-Call/Text Page:  [email or web address]      password [email or web address]  If 7PM-7AM, please contact night-coverage [email or web address] Password [email or web address] 09/07/2012, 3:15 PM   LOS: 2 days

## 2012-09-07 NOTE — Progress Notes (Signed)
  Echocardiogram 2D Echocardiogram has been performed.  Scherry Laverne FRANCES 09/07/2012, 12:34 PM

## 2012-09-07 NOTE — Progress Notes (Signed)
ANTICOAGULATION CONSULT NOTE - Follow Up Consult  Pharmacy Consult for Coumadin Indication: atrial fibrillation  Allergies  Allergen Reactions  . Codeine Nausea And Vomiting    Patient Measurements: Height: 5' 3.5" (161.3 cm) Weight: 147 lb 0.8 oz (66.7 kg) IBW/kg (Calculated) : 53.55  Vital Signs: Temp: 98.4 F (36.9 C) (06/15 0406) Temp src: Oral (06/15 0406) BP: 118/87 mmHg (06/15 0406) Pulse Rate: 81 (06/15 0406)  Labs:  Recent Labs  09/05/12 2021 09/06/12 0211 09/06/12 0550 09/07/12 0355  HGB 12.3  --  11.9* 11.3*  HCT 36.1  --  35.7* 34.2*  PLT 256  --  250 255  LABPROT  --  19.8*  --  24.6*  INR  --  1.75*  --  2.34*  CREATININE 0.83  --  0.83 0.85    Estimated Creatinine Clearance: 44.1 ml/min (by C-G formula based on Cr of 0.85).  Assessment: 88yof continues on coumadin for afib with a therapeutic INR. Lovenox bridge added yesterday for subtherapeutic INR but will discontinue today given INR > 2. Hgb/Hct is slowly trending down, plts stable. No bleeding reported.  Home dose 2mg  daily except 1mg  on Sat/Sun  Goal of Therapy:  INR 2-3 Monitor platelets by anticoagulation protocol: Yes   Plan:  1) Discontinue lovenox 2) Coumadin 1mg  x 1 tonight 3) Follow up INR in AM  Fredrik Rigger 09/07/2012,8:07 AM

## 2012-09-08 ENCOUNTER — Inpatient Hospital Stay (HOSPITAL_COMMUNITY): Payer: Medicare Other

## 2012-09-08 DIAGNOSIS — Z9889 Other specified postprocedural states: Secondary | ICD-10-CM

## 2012-09-08 LAB — BASIC METABOLIC PANEL
CO2: 29 mEq/L (ref 19–32)
Calcium: 9 mg/dL (ref 8.4–10.5)
Chloride: 101 mEq/L (ref 96–112)
Potassium: 3.2 mEq/L — ABNORMAL LOW (ref 3.5–5.1)
Sodium: 138 mEq/L (ref 135–145)

## 2012-09-08 LAB — ALBUMIN, FLUID (OTHER)

## 2012-09-08 LAB — HEMOGLOBIN A1C: Hgb A1c MFr Bld: 5.4 % (ref <5.7)

## 2012-09-08 LAB — PROTIME-INR
INR: 2.86 — ABNORMAL HIGH (ref 0.00–1.49)
Prothrombin Time: 28.5 seconds — ABNORMAL HIGH (ref 11.6–15.2)

## 2012-09-08 LAB — BODY FLUID CELL COUNT WITH DIFFERENTIAL
Eos, Fluid: 0 %
Total Nucleated Cell Count, Fluid: 293 cu mm (ref 0–1000)

## 2012-09-08 LAB — OCCULT BLOOD X 1 CARD TO LAB, STOOL: Fecal Occult Bld: POSITIVE — AB

## 2012-09-08 LAB — GLUCOSE, CAPILLARY
Glucose-Capillary: 101 mg/dL — ABNORMAL HIGH (ref 70–99)
Glucose-Capillary: 131 mg/dL — ABNORMAL HIGH (ref 70–99)

## 2012-09-08 LAB — PROTEIN, TOTAL: Total Protein: 6.3 g/dL (ref 6.0–8.3)

## 2012-09-08 LAB — ALBUMIN: Albumin: 2.8 g/dL — ABNORMAL LOW (ref 3.5–5.2)

## 2012-09-08 LAB — LACTATE DEHYDROGENASE, PLEURAL OR PERITONEAL FLUID

## 2012-09-08 LAB — URINE CULTURE

## 2012-09-08 MED ORDER — POTASSIUM CHLORIDE CRYS ER 20 MEQ PO TBCR
40.0000 meq | EXTENDED_RELEASE_TABLET | ORAL | Status: AC
  Administered 2012-09-08 (×2): 40 meq via ORAL
  Filled 2012-09-08: qty 2
  Filled 2012-09-08: qty 1

## 2012-09-08 MED ORDER — WARFARIN SODIUM 1 MG PO TABS
1.0000 mg | ORAL_TABLET | Freq: Once | ORAL | Status: DC
  Filled 2012-09-08: qty 1

## 2012-09-08 MED ORDER — WARFARIN - PHARMACIST DOSING INPATIENT
Freq: Every day | Status: DC
  Administered 2012-09-08 – 2012-09-13 (×5)

## 2012-09-08 NOTE — Progress Notes (Addendum)
Patient Name: [name] [name]      SUBJECTIVE:   79 y/o withPEA arrest in 2/14 She was transferred to [facility]. Found to have new onset AF with RVR with normal EF and severe MR due to annular dilation. She underwent heart cath which showed non-obstructive CAD. Unfortunately this was complicated by a large left groin bleed. She eventually underwent minimally Invasive Mitral Valve Repair utilizing a 26 mm Sorin Memo 3D ring for annuloplasty and a MAZE Procedure on 05/20/12. Was discharged on 05/27/2012. Aflutter s/p DCCV and amio decreased subsequently 2/2 bradycardia  Admitted with weakness and altered mental status and found also to be hypoxic:  CXR mod R effusion   Continues to improve with treatment of klebsiella UTI. Back to baseline. Underwent thoracentesis of pleural effusion 500 cc yesterday.  Denies CP or SOB. Walked with PT without desaturating.     Past Medical History  Diagnosis Date  . Hypertension   . Hyperlipidemia   . Type 2 diabetes mellitus   . Arthritis   . Coronary artery disease     a. Mild nonobstructive by cath 04/2012, cath complicated by groin hematoma.  . S/P mitral valve repair 05/20/2012    a. Severe MR - 04/2012: 26mm Sorin Memo 3D ring annuloplasty via right mini thoracotomy approach  . S/P Maze operation for atrial fibrillation 05/20/2012    a. 04/2012: Complete bi-atrial lesion set using cryothermy  . Atrial fibrillation     a. Diagnosed 04/2012 in the setting of severe MR. b. s/p TEE/successful DCCV 06/06/12.  Marland Kitchen PEA (Pulseless electrical activity)     a. 04/2012 -> adm with afib, developed PEA, transferred to [facility] for prolonged hospitalization.   . Chronic diastolic CHF (congestive heart failure)     PHYSICAL EXAM Filed Vitals:   09/08/12 1409 09/08/12 1441 09/08/12 1508 09/08/12 1639  BP: 130/45 141/54 122/43 142/55  Pulse: 75   72  Temp: 98.1 F (36.7 C)     TempSrc: Oral     Resp: 18     Height:      Weight:      SpO2: 95%      General: Elderly. Sitting in  chair eating breakfast  No resp difficulty  HEENT: normal  Neck: supple. JVP flat. Carotids 2+ bilaterally; no bruits. No lymphadenopathy or thryomegaly appreciated.  Cor: PMI normal. Regular No rubs, gallops or murmurs.  Lungs: clear decreased at R base Abdomen: soft, nontender, nondistended. No hepatosplenomegaly. No bruits or masses. Good bowel sounds.  Extremities: no cyanosis, clubbing, rash, no edema  Neuro: alert & orientedx3, cranial nerves grossly intact. Moves all 4 extremities w/o difficulty. Affect pleasant.  TELEMETRY: NSR    Intake/Output Summary (Last 24 hours) at 09/08/12 1757 Last data filed at 09/08/12 0800  Gross per 24 hour  Intake    360 ml  Output      0 ml  Net    360 ml    LABS: Basic Metabolic Panel:  Recent Labs Lab 09/05/12 2021 09/06/12 0550 09/07/12 0355 09/08/12 0515  NA 134* 136 135 138  K 4.1 3.7 3.7 3.2*  CL 100 103 100 101  CO2 27 23 24 29   GLUCOSE 192* 108* 107* 85  BUN 27* 24* 19 14  CREATININE 0.83 0.83 0.85 0.77  CALCIUM 9.6 8.9 8.7 9.0   Cardiac Enzymes: No results found for this basename: CKTOTAL, CKMB, CKMBINDEX, TROPONINI,  in the last 72 hours CBC:  Recent Labs Lab 09/05/12 2021 09/06/12 0550 09/07/12 0355  WBC  6.7 6.2 6.2  NEUTROABS 5.1  --   --   HGB 12.3 11.9* 11.3*  HCT 36.1 35.7* 34.2*  MCV 91.9 92.5 92.2  PLT 256 250 255   PROTIME:  Recent Labs  09/06/12 0211 09/07/12 0355 09/08/12 0515  LABPROT 19.8* 24.6* 28.5*  INR 1.75* 2.34* 2.86*   Liver Function Tests:  Recent Labs  09/05/12 2021 09/08/12 1702  AST 29  --   ALT 21  --   ALKPHOS 91  --   BILITOT 0.8  --   PROT 6.4 6.3  ALBUMIN 3.2* 2.8*   No results found for this basename: LIPASE, AMYLASE,  in the last 72 hours BNP: BNP (last 3 results)  Recent Labs  05/06/12 0229 05/13/12 0535 09/06/12 0302  PROBNP 3044.0* 1125.0* 3823.0*   ecg without significant abnormality  ASSESSMENT:  1) Klebsiella UTI with acute delirium and  hypoxia 2) R pleural effusion  3) Chronic diastolic HF 4) AF/AFL s/p Maze 5) Severe MR s/p MV repair  PLAN:  Much improved with treatment of UTI and thoracentesis. Ok for d/c from our standpoint. Diuretic requirement seems to be going down. Would resume previous lasix regimen to take 40mg  lasix daily (with Kcl 20) only when weight 147 or greater. We will f/u in HF clinic in 1-2 weeks.   Will sign off.  Appreciate [facility]!  Migdalia Dk MD  09/08/2012

## 2012-09-08 NOTE — Procedures (Signed)
US guided Rt thora  500 cc yellow fluid For labs per MD  cxr pending

## 2012-09-08 NOTE — Evaluation (Signed)
Physical Therapy Evaluation Patient Details Name: [name] [name] MRN: [id number] DOB: 12-07-1924 Today's Date: 09/08/2012 Time: 1191-4782 PT Time Calculation (min): 27 min  PT Assessment / Plan / Recommendation Clinical Impression  Pt admitted with pleural effusion and UTI and maintained sats 94% on 2L with ambulation and down to 91% on 1L with gait with HR 69-81 throughout activity. Pt familiar from prior admission at which time [facility] due to lack of family assist if pt can progress to meet goals than home is feasible otherwise another stay at [facility] may be warranted. Pt reports dizziness with sitting and standing and states she was tested for vertigo with medicine before but it didn't help. No nystagmus with visual tracking and pt with difficulty performing head turns to asssess potential vestibular component with inconsistent report of what prompts dizziness, will continue to assess. Will follow acutely to maximize safety, activity tolerance and function prior to discharge.     PT Assessment  Patient needs continued PT services    Follow Up Recommendations  Home health PT;SNF;Supervision for mobility/OOB    Does the patient have the potential to tolerate intense rehabilitation      Barriers to Discharge Decreased caregiver support      Equipment Recommendations  None recommended by PT    Recommendations for Other Services OT consult   Frequency Min 3X/week    Precautions / Restrictions Precautions Precautions: Fall Precaution Comments: check O2 sats   Pertinent Vitals/Pain No pain sats 97% on 5L, 94% on 2L, 91% on 1L with gait HR 69-81      Mobility  Bed Mobility Bed Mobility: Not assessed Transfers Transfers: Sit to Stand;Stand to Sit Sit to Stand: 4: Min assist;From chair/3-in-1 Stand to Sit: 4: Min guard;To chair/3-in-1 Details for Transfer Assistance: cueing for anterior translation with assist to complete and cueing for hand placement and control of  descent Ambulation/Gait Ambulation/Gait Assistance: 5: Supervision Ambulation Distance (Feet): 300 Feet Assistive device: Rolling walker Gait Pattern: Step-through pattern;Decreased stride length Gait velocity: decreased Stairs: No    Exercises     PT Diagnosis: Difficulty walking;Generalized weakness  PT Problem List: Decreased mobility;Decreased activity tolerance PT Treatment Interventions: Gait training;DME instruction;Functional mobility training;Therapeutic activities;Therapeutic exercise;Patient/family education   PT Goals Acute Rehab PT Goals PT Goal Formulation: With patient Time For Goal Achievement: 09/22/12 Potential to Achieve Goals: Good Pt will go Supine/Side to Sit: with modified independence;with HOB 0 degrees PT Goal: Supine/Side to Sit - Progress: Goal set today Pt will go Sit to Stand: with modified independence PT Goal: Sit to Stand - Progress: Goal set today Pt will go Stand to Sit: with modified independence PT Goal: Stand to Sit - Progress: Goal set today Pt will Ambulate: >150 feet;with modified independence PT Goal: Ambulate - Progress: Goal set today Additional Goals Additional Goal #1: Pt will complete Berg with score >52 to decrease fall risk PT Goal: Additional Goal #1 - Progress: Goal set today  Visit Information  Last PT Received On: 09/08/12 Assistance Needed: +1    Subjective Data  Subjective: I was doing well and know I have this set back Patient Stated Goal: be able to return home   Prior Functioning  Home Living Lives With: Alone Available Help at Discharge: Family;Available PRN/intermittently Type of Home: House Home Access: Ramped entrance Home Layout: One level Bathroom Shower/Tub: Walk-in shower;Door Bathroom Toilet: Standard Home Adaptive Equipment: Raised toilet seat with rails;Walker - rolling;Straight cane Prior Function Level of Independence: Needs assistance Needs Assistance: Light Housekeeping;Meal  Prep;Gait Meal  Prep: Maximal Light Housekeeping: Total Gait Assistance: cane inside and RW outside Able to Take Stairs?: No Driving: No Vocation: Retired Musician: No difficulties    Copywriter, advertising Arousal/Alertness: Awake/alert Behavior During Therapy: WFL for tasks assessed/performed Overall Cognitive Status: Within Functional Limits for tasks assessed    Extremity/Trunk Assessment Right Lower Extremity Assessment RLE ROM/Strength/Tone: Deficits RLE ROM/Strength/Tone Deficits: grossly 3/5 not formally assessed Left Lower Extremity Assessment LLE ROM/Strength/Tone: Deficits LLE ROM/Strength/Tone Deficits: grossly 3/5 not formally assessed Trunk Assessment Trunk Assessment: Normal   Balance Standardized Balance Assessment Standardized Balance Assessment: Berg Balance Test  End of Session PT - End of Session Equipment Utilized During Treatment: Gait belt;Oxygen Activity Tolerance: Patient tolerated treatment well Patient left: in chair;with call bell/phone within reach Nurse Communication: Mobility status  GP     Toney Sang [facility] 09/08/2012, 12:04 PM Delaney Meigs, PT [phone]

## 2012-09-08 NOTE — Progress Notes (Signed)
ANTICOAGULATION CONSULT NOTE - Follow Up Consult  Pharmacy Consult for Coumadin Indication: atrial fibrillation  Allergies  Allergen Reactions  . Codeine Nausea And Vomiting    Patient Measurements: Height: 5' 3.5" (161.3 cm) Weight: 143 lb 8 oz (65.091 kg) IBW/kg (Calculated) : 53.55  Vital Signs: Temp: 98.9 F (37.2 C) (06/16 0429) Temp src: Oral (06/16 0429) BP: 139/55 mmHg (06/16 0829) Pulse Rate: 74 (06/16 0829)  Labs:  Recent Labs  09/05/12 2021 09/06/12 0211 09/06/12 0550 09/07/12 0355 09/08/12 0515  HGB 12.3  --  11.9* 11.3*  --   HCT 36.1  --  35.7* 34.2*  --   PLT 256  --  250 255  --   LABPROT  --  19.8*  --  24.6* 28.5*  INR  --  1.75*  --  2.34* 2.86*  CREATININE 0.83  --  0.83 0.85 0.77    Estimated Creatinine Clearance: 46.4 ml/min (by C-G formula based on Cr of 0.77).  Assessment: 88yof continues on coumadin for afib with a therapeutic INR (2.86). No bleeding reported.  Home dose 2mg  daily except 1mg  on Sat/Sun  Goal of Therapy:  INR 2-3 Monitor platelets by anticoagulation protocol: Yes   Plan:  - Coumadin 1mg  x 1 tonight - Follow up INR in AM  Bayard Hugger, PharmD, BCPS  Clinical Pharmacist  Pager: [phone]   09/08/2012,11:07 AM

## 2012-09-08 NOTE — Progress Notes (Signed)
Patient cleaned of incontinent urine. Assisted to bedside chair. Gown and bed linens changed. Call bell near. [name] [name]

## 2012-09-08 NOTE — Progress Notes (Addendum)
[facility] Progress Note    [name] [name]:[id number] DOB: 12-21-1924 DOA: 09/05/2012 PCP: Marylen Ponto, MD  Brief narrative: 79 y.o. female with chronic diastolic CHF who presented to the ED with complaints of worsening weakness, fatigue, and malaise X 1 week. She was found to have hypoxemia in the ED with O2 saturations in the 80's. She was evaluated by her Cardiologist for CHF and was taken off of her lasix 5 days previously due to dehydration and was instructed to resume her lasix once she had gained 5 pounds. She reported having anorexia, and denied having any fevers or chills or nausea or vomiting or diarrhea.   In the ED she was found to have a right pleural Effusion with a possible RLL infiltrate. She was also found to have a UTI.   Assessment/Plan:  R Pleural effusion w/ acute hypoxic resp failure Review of x-rays suggest that this has been slowly progressive since 09/01/2012 - accurate assessment of volume is complicated by the fact the patient has a chronically elevated R hemidiaphragm - there is no other clear etiology to her hypoxia at this time (patient does not normally require oxygen at home) -  -pt seen by Dr. Teressa Lower /heart failure team this a.m. and recommends a thoracentesis per IR, INR therapeutic at 2.86 this am so will to hold a Coumadin for now ?RLL PNA WBC not elevated - pt afebrile - doubt PNA   Diabetes type 2, uncontrolled  CBG reasonably controlled at this time - check A1c  Chronic diastolic heart failure Patient was diuresed with oral Lasix 40 twice a day for 2 days and it was [facility], cardiology to follow and further diuresis as clinically appropriate Severe MR s/p minimally invasive MVR Feb 2014 Echocardiogram pending to evaluate valve - will formally consult TCTS if echo raises concern for valve issue -echo as below with EF 60-65% and moderate mitral stenosis with mild to moderate regurgitation, cardiology to follow further/?TCTs consult needed UTI   Awaiting culture data - continue empiric antibiotic therapy  HLD Continue Zocor  HTN Blood pressure reasonably well controlled at present - follow without change  Hx of PEA arrest Feb 2014   Mild nonobstructive CAD via cardiac cath Feb 2014  Hx of afib s/p Maze Feb 2014 w/ subsequent recurrence s/p TEE/DCC 06/06/12  Normocytic anemia  Anemia panel reveals mild iron deficiency - initiate iron replacement once patient more ambulatory  MRSA screen + Usual contact precautions Hypokalemia -Replace k Code Status: FULL Family Communication: No family present at time of visit today Disposition Plan: Pending clinical course  Consultants: Cardiology  Procedures: Echo Study Conclusions  - Left ventricle: The cavity size was normal. Systolic function was normal. The estimated ejection fraction was in the range of 60% to 65%. Wall motion was normal; there were no regional wall motion abnormalities. - Aortic valve: Mild regurgitation. - Mitral valve: Reported prior MV repair. Thickening and calcification noted of both anterior and psoterior leaflets. Possible mitral annular calcification also. Prior procedures included surgical repair. An annular ring prosthesis was present. Transvalvular velocity was increased, due to stenosis. The findings are consistent with moderate stenosis. Mild to moderate regurgitation. Valve area by continuity equation (using LVOT flow): 1.11cm^2. - Right atrium: The atrium was mildly dilated. - Tricuspid valve: Moderate regurgitation. - Pulmonary arteries: Systolic pressure was moderately  Antibiotics: Rocephin 6/13 >> Azithromycin 6/14>>6/15 Cefuroxime 6/13  DVT prophylaxis: lovenox >> warfarin  HPI/Subjective: The patient is awake alert and oriented.  She has no specific complaints  at this time.  She states she feels "much better than yesterday."  She denies chest pain fevers chills nausea or vomiting.  Objective: Blood pressure 139/55,  pulse 74, temperature 98.9 F (37.2 C), temperature source Oral, resp. rate 20, height 5' 3.5" (1.613 m), weight 65.091 kg (143 lb 8 oz), SpO2 93.00%.  Intake/Output Summary (Last 24 hours) at 09/08/12 1048 Last data filed at 09/07/12 1800  Gross per 24 hour  Intake    480 ml  Output      0 ml  Net    480 ml   Exam: General: No acute respiratory distress at rest but still requiring 4 L nasal cannula oxygen Lungs: Decreased breath sounds in right base with good air movement throughout other fields with no wheeze Cardiovascular: Regular rate and rhythm without murmur gallop or rub appreciable Abdomen: Nontender, nondistended, soft, bowel sounds positive, no rebound, no ascites, no appreciable mass Extremities: No significant cyanosis, clubbing, or edema bilateral lower extremities   Data Reviewed: Basic Metabolic Panel:  Recent Labs Lab 09/05/12 2021 09/06/12 0550 09/07/12 0355 09/08/12 0515  NA 134* 136 135 138  K 4.1 3.7 3.7 3.2*  CL 100 103 100 101  CO2 27 23 24 29   GLUCOSE 192* 108* 107* 85  BUN 27* 24* 19 14  CREATININE 0.83 0.83 0.85 0.77  CALCIUM 9.6 8.9 8.7 9.0   Liver Function Tests:  Recent Labs Lab 09/05/12 2021  AST 29  ALT 21  ALKPHOS 91  BILITOT 0.8  PROT 6.4  ALBUMIN 3.2*   CBC:  Recent Labs Lab 09/05/12 2021 09/06/12 0550 09/07/12 0355  WBC 6.7 6.2 6.2  NEUTROABS 5.1  --   --   HGB 12.3 11.9* 11.3*  HCT 36.1 35.7* 34.2*  MCV 91.9 92.5 92.2  PLT 256 250 255   BNP (last 3 results)  Recent Labs  05/06/12 0229 05/13/12 0535 09/06/12 0302  PROBNP 3044.0* 1125.0* 3823.0*   CBG:  Recent Labs Lab 09/07/12 0823 09/07/12 1156 09/07/12 1744 09/07/12 2101 09/08/12 0600  GLUCAP 106* 146* 104* 186* 94    Recent Results (from the past 240 hour(s))  URINE CULTURE     Status: None   Collection Time    09/05/12 10:22 PM      Result Value Range Status   Specimen Description URINE, RANDOM   Final   Special Requests NONE   Final    Culture  Setup Time 09/06/2012 16:58   Final   Colony Count >=100,000 COLONIES/ML   Final   Culture KLEBSIELLA PNEUMONIAE   Final   Report Status 09/08/2012 FINAL   Final   Organism ID, Bacteria KLEBSIELLA PNEUMONIAE   Final  MRSA PCR SCREENING     Status: Abnormal   Collection Time    09/06/12  4:52 AM      Result Value Range Status   MRSA by PCR POSITIVE (*) NEGATIVE Final   Comment:            The GeneXpert MRSA Assay (FDA     approved for NASAL specimens     only), is one component of a     comprehensive MRSA colonization     surveillance program. It is not     intended to diagnose MRSA     infection nor to guide or     monitor treatment for     MRSA infections.     RESULT CALLED TO, READ BACK BY AND VERIFIED WITH:     LINDSAY J.,RN  09/06/12 0818 BY JONESJ     Studies:  Recent x-ray studies have been reviewed in detail by the Attending Physician  Scheduled Meds:  Scheduled Meds: . amiodarone  200 mg Oral Daily  . aspirin EC  325 mg Oral Daily  . carvedilol  3.125 mg Oral BID WC  . cefTRIAXone (ROCEPHIN)  IV  1 g Intravenous Q24H  . Chlorhexidine Gluconate Cloth  6 each Topical Q0600  . citalopram  10 mg Oral Daily  . cyanocobalamin  1,000 mcg Subcutaneous Q1500  . insulin aspart  0-5 Units Subcutaneous QHS  . insulin aspart  0-9 Units Subcutaneous TID WC  . insulin glargine  18 Units Subcutaneous QHS  . lisinopril  2.5 mg Oral Daily  . magnesium oxide  400 mg Oral BID  . mupirocin ointment  1 application Nasal BID  . potassium chloride  40 mEq Oral Q4H  . rOPINIRole  0.5 mg Oral QHS  . simvastatin  20 mg Oral q1800  . Warfarin - Pharmacist Dosing Inpatient   Does not apply q1800    Time spent on care of this patient: 35 mins   Kela Millin [phone] [facility] Office  [phone] Pager - Text Page per Loretha Stapler as per below:  On-Call/Text Page:      [email or web address]      password [email or web address]  If 7PM-7AM, please contact  night-coverage [email or web address] Password [email or web address] 09/08/2012, 10:48 AM   LOS: 3 days

## 2012-09-08 NOTE — Progress Notes (Signed)
Occupational Therapy Evaluation Patient Details Name: [name] [name] MRN: [id number] DOB: [birth year] Today's Date: 09/08/2012 Time: 4098-1191 OT Time Calculation (min): 17 min  OT Assessment / Plan / Recommendation Clinical Impression  Pt admitted with pleural effusion and UTI. PTA, pt living at home independently  and going to cardiac rehab. Pt will not reach level of independence needed to D/C  Home safely. Pt would like to go to [facility] rehab after D/C from hospital.  Pt will benefit from skilled OT services to facilitate D/C to next venue due to below deficits.    OT Assessment  Patient needs continued OT Services    Follow Up Recommendations  SNF    Barriers to Discharge Decreased caregiver support    Equipment Recommendations  None recommended by OT    Recommendations for Other Services    Frequency  Min 2X/week    Precautions / Restrictions Precautions Precautions: Fall Precaution Comments: check O2 sats   Pertinent Vitals/Pain O2 97 after ADL activity    ADL  Grooming: Set up Where Assessed - Grooming: Unsupported sitting Upper Body Bathing: Set up;Supervision/safety Where Assessed - Upper Body Bathing: Unsupported sitting Lower Body Bathing: Minimal assistance Where Assessed - Lower Body Bathing: Supported sit to stand Upper Body Dressing: Set up;Supervision/safety Where Assessed - Upper Body Dressing: Unsupported sitting Lower Body Dressing: Minimal assistance Where Assessed - Lower Body Dressing: Supported sit to stand Toilet Transfer: Hydrographic surveyor Method: Sit to stand;Stand pivot Acupuncturist: Materials engineer and Hygiene: Supervision/safety Where Assessed - Engineer, mining and Hygiene: Sit to stand from 3-in-1 or toilet Transfers/Ambulation Related to ADLs: MIN a ADL Comments: poor endurance significantly limits ADL    OT Diagnosis: Generalized weakness;Acute pain  OT Problem List:  Decreased strength;Decreased range of motion;Decreased activity tolerance;Decreased coordination;Decreased knowledge of use of DME or AE;Cardiopulmonary status limiting activity OT Treatment Interventions: Self-care/ADL training;Therapeutic exercise;Energy conservation;DME and/or AE instruction;Therapeutic activities;Patient/family education   OT Goals Acute Rehab OT Goals OT Goal Formulation: With patient Time For Goal Achievement: 09/22/12 Potential to Achieve Goals: Good ADL Goals Pt Will Transfer to Toilet: with supervision;Ambulation;with DME ADL Goal: Toilet Transfer - Progress: Goal set today Pt Will Perform Toileting - Clothing Manipulation: with modified independence;Sitting on 3-in-1 or toilet;Standing ADL Goal: Toileting - Clothing Manipulation - Progress: Goal set today Pt Will Perform Toileting - Hygiene: with modified independence;Sit to stand from 3-in-1/toilet ADL Goal: Toileting - Hygiene - Progress: Goal set today Additional ADL Goal #1: Pt will complete BUE AROM/strengthening HEP tp omcrease BUE strength to increase indpendnce with ADL. ADL Goal: Additional Goal #1 - Progress: Goal set today  Visit Information  Last OT Received On: 09/08/12 Assistance Needed: +1    Subjective Data      Prior Functioning     Home Living Lives With: Alone Available Help at Discharge: Family;Available PRN/intermittently Type of Home: House Home Access: Ramped entrance Home Layout: One level Bathroom Shower/Tub: Walk-in shower;Door Bathroom Toilet: Standard Home Adaptive Equipment: Raised toilet seat with rails;Walker - rolling;Straight cane Prior Function Level of Independence: Needs assistance Needs Assistance: Light Housekeeping;Meal Prep;Gait Meal Prep: Maximal Light Housekeeping: Total Gait Assistance: cane inside and RW outside Able to Take Stairs?: No Driving: No Vocation: Retired Comments: participating in cardiac rehab Communication Communication: No  difficulties         Vision/Perception Vision - History Baseline Vision: Wears glasses all the time   Cognition  Cognition Arousal/Alertness: Awake/alert Behavior During Therapy: [facility] for tasks assessed/performed Overall  Cognitive Status: Within Functional Limits for tasks assessed    Extremity/Trunk Assessment Right Upper Extremity Assessment RUE ROM/Strength/Tone: Deficits RUE ROM/Strength/Tone Deficits: R shoulder ROM deficit. ? RTC dysfunciton. limited shoulder FF to @ 20 RUE Sensation: WFL - Light Touch;WFL - Proprioception RUE Coordination: Deficits RUE Coordination Deficits: decrased gross motor Left Upper Extremity Assessment LUE ROM/Strength/Tone: Deficits LUE ROM/Strength/Tone Deficits: generalized weakness LUE Sensation: WFL - Light Touch;WFL - Proprioception LUE Coordination: WFL - gross/fine motor Right Lower Extremity Assessment RLE ROM/Strength/Tone: Deficits Trunk Assessment Trunk Assessment: Kyphotic     Mobility Bed Mobility Bed Mobility: Rolling Left;Left Sidelying to Sit;Sitting - Scoot to Edge of Bed Rolling Left: 6: Modified independent (Device/Increase time) Left Sidelying to Sit: 4: Min assist;HOB flat;With rails Sitting - Scoot to Edge of Bed: 5: Supervision Details for Bed Mobility Assistance: vc for technique to get OOB Transfers Transfers: Sit to Stand;Stand to Sit Sit to Stand: 4: Min assist;With upper extremity assist;From bed Stand to Sit: 4: Min assist;With upper extremity assist;To chair/3-in-1     Exercise     Balance  [place]   End of Session OT - End of Session Activity Tolerance: Patient limited by fatigue Patient left: in chair;with call bell/phone within reach;with nursing in room Nurse Communication: Mobility status  GO     Keidy Thurgood,HILLARY 09/08/2012, 5:20 PM [facility], OTR/L  [phone] 09/08/2012

## 2012-09-09 DIAGNOSIS — I5032 Chronic diastolic (congestive) heart failure: Secondary | ICD-10-CM

## 2012-09-09 LAB — BASIC METABOLIC PANEL
Calcium: 8.6 mg/dL (ref 8.4–10.5)
Creatinine, Ser: 0.81 mg/dL (ref 0.50–1.10)
GFR calc Af Amer: 74 mL/min — ABNORMAL LOW (ref >90)
GFR calc non Af Amer: 64 mL/min — ABNORMAL LOW (ref >90)

## 2012-09-09 LAB — GLUCOSE, CAPILLARY
Glucose-Capillary: 125 mg/dL — ABNORMAL HIGH (ref 70–99)
Glucose-Capillary: 86 mg/dL (ref 70–99)

## 2012-09-09 LAB — URINE CULTURE
Colony Count: NO GROWTH
Culture: NO GROWTH
Special Requests: NORMAL

## 2012-09-09 LAB — CBC
HCT: 31.9 % — ABNORMAL LOW (ref 36.0–46.0)
Hemoglobin: 10.6 g/dL — ABNORMAL LOW (ref 12.0–15.0)
WBC: 4.7 10*3/uL (ref 4.0–10.5)

## 2012-09-09 LAB — PROTIME-INR: INR: 2.28 — ABNORMAL HIGH (ref 0.00–1.49)

## 2012-09-09 MED ORDER — CEFUROXIME AXETIL 500 MG PO TABS
500.0000 mg | ORAL_TABLET | Freq: Two times a day (BID) | ORAL | Status: DC
  Administered 2012-09-10: 500 mg via ORAL
  Filled 2012-09-09 (×3): qty 1

## 2012-09-09 MED ORDER — WARFARIN SODIUM 2 MG PO TABS
2.0000 mg | ORAL_TABLET | Freq: Once | ORAL | Status: AC
  Administered 2012-09-09: 2 mg via ORAL
  Filled 2012-09-09: qty 1

## 2012-09-09 MED ORDER — FUROSEMIDE 40 MG PO TABS
40.0000 mg | ORAL_TABLET | Freq: Every day | ORAL | Status: DC
  Administered 2012-09-10 – 2012-09-12 (×3): 40 mg via ORAL
  Filled 2012-09-09 (×3): qty 1

## 2012-09-09 MED ORDER — POTASSIUM CHLORIDE CRYS ER 20 MEQ PO TBCR
40.0000 meq | EXTENDED_RELEASE_TABLET | Freq: Every day | ORAL | Status: DC
  Administered 2012-09-10: 40 meq via ORAL
  Filled 2012-09-09: qty 2

## 2012-09-09 NOTE — Progress Notes (Signed)
ANTICOAGULATION CONSULT NOTE - Follow Up Consult  Pharmacy Consult for Coumadin Indication: atrial fibrillation  Allergies  Allergen Reactions  . Codeine Nausea And Vomiting    Patient Measurements: Height: 5' 3.5" (161.3 cm) Weight: 147 lb 11.3 oz (67 kg) IBW/kg (Calculated) : 53.55  Vital Signs: Temp: 98.5 F (36.9 C) (06/17 0900) Temp src: Oral (06/17 0900) BP: 126/69 mmHg (06/17 0900) Pulse Rate: 67 (06/17 0900)  Labs:  Recent Labs  09/07/12 0355 09/08/12 0515 09/09/12 0435  HGB 11.3*  --  10.6*  HCT 34.2*  --  31.9*  PLT 255  --  254  LABPROT 24.6* 28.5* 24.1*  INR 2.34* 2.86* 2.28*  CREATININE 0.85 0.77 0.81    Estimated Creatinine Clearance: 46.4 ml/min (by C-G formula based on Cr of 0.81).  Assessment: 88yof on chronic coumadin for afib, coumadin dose was on hold yesterday for thoracentesis. NR (2.28) is therapeutic, OK to resume coumadin today per Dr. Suanne Marker. CBC stable, no bleeding reported.  Home dose 2mg  daily except 1mg  on Sat/Sun  Goal of Therapy:  INR 2-3 Monitor platelets by anticoagulation protocol: Yes   Plan:  - Coumadin 2mg  x 1 tonight - Follow up INR in AM  Bayard Hugger, PharmD, BCPS  Clinical Pharmacist  Pager: [phone]   09/09/2012,10:51 AM

## 2012-09-09 NOTE — Progress Notes (Addendum)
[facility] Progress Note    [name] [name]:[id number] DOB: 01/22/1925 DOA: 09/05/2012 PCP: Marylen Ponto, MD  Brief narrative: 79 y.o. female with chronic diastolic CHF who presented to the ED with complaints of worsening weakness, fatigue, and malaise X 1 week. She was found to have hypoxemia in the ED with O2 saturations in the 80's. She was evaluated by her Cardiologist for CHF and was taken off of her lasix 5 days previously due to dehydration and was instructed to resume her lasix once she had gained 5 pounds. She reported having anorexia, and denied having any fevers or chills or nausea or vomiting or diarrhea.   In the ED she was found to have a right pleural Effusion with a possible RLL infiltrate. She was also found to have a UTI.   Assessment/Plan:  R Pleural effusion w/ acute hypoxic resp failure Review of x-rays suggest that this has been slowly progressive since 09/01/2012 - accurate assessment of volume was complicated by the fact the patient has a chronically elevated R hemidiaphragm - but it was noted that she had a R. Pleural effusion on CXR (patient does not normally require oxygen at home) -  -pt seen by Dr. Teressa Lower /heart failure team 6/16 and recommended a thoracentesis per IR>>done 6/16 and 500cc removed -pt's Oxygenation improved significantly and now weaned down to 1.5L this am and satting at 95% >> nsg to continue to wean off as tol -pleural fluid cell count with 293 wbc, 68% mono, culture with no growth to date, follow up rest of fluid studies in am  ?RLL PNA WBC not elevated - pt afebrile - PNA was doubted, she had been treated with abx initially and they were d/c'ed prior to transfer to tele floor.  Diabetes type 2, uncontrolled  CBG reasonably controlled at this time - check A1c  Chronic diastolic heart failure Patient was diuresed with oral Lasix 40 twice a day for 2 days and it was DC'd -per cardiology  Lasix note today, lasis to be resumed when wt  147 or greater>> her wt is 147.4lbs today, will resume lasix and Kcl, she is to follow up at HF clinic in 1-2weeks.  Severe MR s/p minimally invasive MVR Feb 2014 Echocardiogram pending to evaluate valve -echo as below with EF 60-65% and moderate mitral stenosis with mild to moderate regurgitation -plan was to formally consult TCTS if echo findings raised concerns but cards raised no concerns regarding echo results and pt is to follow up at HF in 1-2wks following d/c  UTI, kleb Pneumoniae -change rocephin(started 6/13) to ceftin beginning in am to complete a total of 10days of abx -clinically improved much improved  HLD Continue Zocor  HTN Blood pressure reasonably well controlled at present - follow without change  Hx of PEA arrest Feb 2014   Mild nonobstructive CAD via cardiac cath Feb 2014  Hx of afib s/p Maze Feb 2014 w/ subsequent recurrence s/p TEE/DCC 06/06/12  Normocytic anemia  Anemia panel reveals mild iron deficiency - initiate iron replacement once patient more ambulatory  MRSA screen + Usual contact precautions Hypokalemia -Resolved Deconditioning -pt was seen by PT/OT and SNF recmmended, awaiting placement  Code Status: FULL Family Communication: No family present at time of visit today Disposition Plan: to SNF when bed available  Consultants: Cardiology  Procedures: Echo Study Conclusions  - Left ventricle: The cavity size was normal. Systolic function was normal. The estimated ejection fraction was in the range of 60% to 65%. Wall motion was  normal; there were no regional wall motion abnormalities. - Aortic valve: Mild regurgitation. - Mitral valve: Reported prior MV repair. Thickening and calcification noted of both anterior and posterior leaflets. Possible mitral annular calcification also. Prior procedures included surgical repair. An annular ring prosthesis was present. Transvalvular velocity was increased, due to stenosis. The findings are  consistent with moderate stenosis. Mild to moderate regurgitation. Valve area by continuity equation (using LVOT flow): 1.11cm^2. - Right atrium: The atrium was mildly dilated. - Tricuspid valve: Moderate regurgitation. - Pulmonary arteries: Systolic pressure was moderately Thoracentesis -per IR on 6/16 and 500 cc of fluid removed Antibiotics: Rocephin 6/13 >> Azithromycin 6/14>>6/15 Cefuroxime 6/13  DVT prophylaxis: lovenox >> warfarin  HPI/Subjective: patient states she feels much better today - breathing better and denies n/v Objective: Blood pressure 126/69, pulse 67, temperature 98.5 F (36.9 C), temperature source Oral, resp. rate 18, height 5' 3.5" (1.613 m) wt 67kg=147.4lb  Intake/Output Summary (Last 24 hours) at 09/09/12 1358 Last data filed at 09/09/12 1200  Gross per 24 hour  Intake   1060 ml  Output    300 ml  Net    760 ml   Exam: General: No acute respiratory distress at rest but still requiring 4 L nasal cannula oxygen Lungs: better air mov't/BS sounds in right base and throughout other fields, no wheeze Cardiovascular: Regular rate and rhythm without murmur gallop or rub appreciable Abdomen: Nontender, nondistended, soft, bowel sounds positive, no rebound, no ascites, no appreciable mass Extremities: No significant cyanosis, clubbing, or edema bilateral lower extremities   Data Reviewed: Basic Metabolic Panel:  Recent Labs Lab 09/05/12 2021 09/06/12 0550 09/07/12 0355 09/08/12 0515 09/09/12 0435  NA 134* 136 135 138 139  K 4.1 3.7 3.7 3.2* 4.0  CL 100 103 100 101 104  CO2 27 23 24 29 28   GLUCOSE 192* 108* 107* 85 77  BUN 27* 24* 19 14 19   CREATININE 0.83 0.83 0.85 0.77 0.81  CALCIUM 9.6 8.9 8.7 9.0 8.6   Liver Function Tests:  Recent Labs Lab 09/05/12 2021 09/08/12 1702  AST 29  --   ALT 21  --   ALKPHOS 91  --   BILITOT 0.8  --   PROT 6.4 6.3  ALBUMIN 3.2* 2.8*   CBC:  Recent Labs Lab 09/05/12 2021 09/06/12 0550  09/07/12 0355 09/09/12 0435  WBC 6.7 6.2 6.2 4.7  NEUTROABS 5.1  --   --   --   HGB 12.3 11.9* 11.3* 10.6*  HCT 36.1 35.7* 34.2* 31.9*  MCV 91.9 92.5 92.2 92.7  PLT 256 250 255 254   BNP (last 3 results)  Recent Labs  05/06/12 0229 05/13/12 0535 09/06/12 0302  PROBNP 3044.0* 1125.0* 3823.0*   CBG:  Recent Labs Lab 09/08/12 1130 09/08/12 1642 09/08/12 2044 09/09/12 0618 09/09/12 1117  GLUCAP 137* 101* 131* 101* 125*    Recent Results (from the past 240 hour(s))  URINE CULTURE     Status: None   Collection Time    09/05/12 10:22 PM      Result Value Range Status   Specimen Description URINE, RANDOM   Final   Special Requests NONE   Final   Culture  Setup Time 09/06/2012 16:58   Final   Colony Count >=100,000 COLONIES/ML   Final   Culture KLEBSIELLA PNEUMONIAE   Final   Report Status 09/08/2012 FINAL   Final   Organism ID, Bacteria KLEBSIELLA PNEUMONIAE   Final  MRSA PCR SCREENING  Status: Abnormal   Collection Time    09/06/12  4:52 AM      Result Value Range Status   MRSA by PCR POSITIVE (*) NEGATIVE Final   Comment:            The GeneXpert MRSA Assay (FDA     approved for NASAL specimens     only), is one component of a     comprehensive MRSA colonization     surveillance program. It is not     intended to diagnose MRSA     infection nor to guide or     monitor treatment for     MRSA infections.     RESULT CALLED TO, READ BACK BY AND VERIFIED WITH:     LINDSAY J.,RN 09/06/12 0818 BY JONESJ  CULTURE, BLOOD (ROUTINE X 2)     Status: None   Collection Time    09/06/12  5:50 AM      Result Value Range Status   Specimen Description BLOOD LEFT ARM   Final   Special Requests BOTTLES DRAWN AEROBIC AND ANAEROBIC 10CC EACH   Final   Culture  Setup Time 09/06/2012 13:30   Final   Culture     Final   Value:        BLOOD CULTURE RECEIVED NO GROWTH TO DATE CULTURE WILL BE HELD FOR 5 DAYS BEFORE ISSUING A FINAL NEGATIVE REPORT   Report Status PENDING    Incomplete  CULTURE, BLOOD (ROUTINE X 2)     Status: None   Collection Time    09/06/12  5:55 AM      Result Value Range Status   Specimen Description BLOOD LEFT ARM   Final   Special Requests BOTTLES DRAWN AEROBIC AND ANAEROBIC 10CC EACH   Final   Culture  Setup Time 09/06/2012 13:30   Final   Culture     Final   Value:        BLOOD CULTURE RECEIVED NO GROWTH TO DATE CULTURE WILL BE HELD FOR 5 DAYS BEFORE ISSUING A FINAL NEGATIVE REPORT   Report Status PENDING   Incomplete  BODY FLUID CULTURE     Status: None   Collection Time    09/08/12  3:01 PM      Result Value Range Status   Specimen Description PLEURAL FLUID RIGHT   Final   Special Requests 60 ML FLUID   Final   Gram Stain     Final   Value: NO WBC SEEN     NO ORGANISMS SEEN   Culture NO GROWTH 1 DAY   Final   Report Status PENDING   Incomplete     Studies:  Recent x-ray studies have been reviewed in detail by the Attending Physician  Scheduled Meds:  Scheduled Meds: . amiodarone  200 mg Oral Daily  . aspirin EC  325 mg Oral Daily  . carvedilol  3.125 mg Oral BID WC  . cefTRIAXone (ROCEPHIN)  IV  1 g Intravenous Q24H  . Chlorhexidine Gluconate Cloth  6 each Topical Q0600  . citalopram  10 mg Oral Daily  . cyanocobalamin  1,000 mcg Subcutaneous Q1500  . insulin aspart  0-5 Units Subcutaneous QHS  . insulin aspart  0-9 Units Subcutaneous TID WC  . insulin glargine  18 Units Subcutaneous QHS  . lisinopril  2.5 mg Oral Daily  . magnesium oxide  400 mg Oral BID  . mupirocin ointment  1 application Nasal BID  . rOPINIRole  0.5 mg  Oral QHS  . simvastatin  20 mg Oral q1800  . warfarin  2 mg Oral ONCE-1800  . Warfarin - Pharmacist Dosing Inpatient   Does not apply q1800    Time spent on care of this patient: 25 mins   Kela Millin [phone] [facility] Office  [phone] Pager - Text Page per Loretha Stapler as per below:  On-Call/Text Page:      [email or web address]      password [email or web address]  If 7PM-7AM, please contact  night-coverage [email or web address] Password [email or web address] 09/09/2012, 1:58 PM   LOS: 4 days

## 2012-09-09 NOTE — Progress Notes (Signed)
Pt ambulated with walker in hallway. Pt had no complaints of dizziness, pain, or shortness of breath. Pt oxygen saturation was 96% after ambulation.

## 2012-09-09 NOTE — Progress Notes (Signed)
Ambulated 150 ft in hallway with patient using front rolling walker w/out difficulty. Patient ambulated on 2L nasal cannula; O2 sat 96%. O2 decreased to 1.5L. Will re-evaluate. Returned to bedside chair. Call bell near.Mamie Levers

## 2012-09-09 NOTE — Progress Notes (Signed)
Physical Therapy Treatment Patient Details Name: [name] [name] MRN: [id number] DOB: 02/22/1925 Today's Date: 09/09/2012 Time: 1308-6578 PT Time Calculation (min): 33 min  PT Assessment / Plan / Recommendation Comments on Treatment Session  Pt s/p UTI, hypoxia, right pleural effusion and ? vertigo.  Recent MVR.  Treatment performed for right BPPV.  No desat without O2 today with mobility.  Still may benefit from SNF secondary to balance deficits.      Follow Up Recommendations  SNF;Supervision for mobility/OOB                 Equipment Recommendations  None recommended by PT    Recommendations for Other Services OT consult  Frequency Min 3X/week   Plan Discharge plan remains appropriate;Frequency remains appropriate    Precautions / Restrictions Precautions Precautions: Fall Restrictions Weight Bearing Restrictions: No   Pertinent Vitals/Pain O2 sats on RA with activity 92% and >.  Other VSS, No pain    Mobility  Bed Mobility Bed Mobility: Rolling Left;Left Sidelying to Sit;Sitting - Scoot to Edge of Bed Rolling Left: 6: Modified independent (Device/Increase time) Left Sidelying to Sit: 4: Min assist;HOB flat;With rails Sitting - Scoot to Edge of Bed: 5: Supervision Details for Bed Mobility Assistance: vc for technique to get OOB.  Performed brief vestibular assessment with questionable positive Hallpike test for Right BPPV.  Treated for right BPPV with Epley maneuver.  Will follow up to determine if Epley helped pt.   Transfers Transfers: Sit to Stand;Stand to Sit Sit to Stand: 4: Min assist;With upper extremity assist;From bed Stand to Sit: 4: Min assist;With upper extremity assist;To chair/3-in-1 Details for Transfer Assistance: cueing for anterior translation with assist to complete and cueing for hand placement and control of descent Ambulation/Gait Ambulation/Gait Assistance: 5: Supervision;4: Min assist Ambulation Distance (Feet): 350 Feet Assistive device: Rolling  walker;1 person hand held assist Ambulation/Gait Assistance Details: Ambulation without RW needing HHA secondary to unsteady without device.  Pt staggers at times.  Needs to use RW at all times for safety.  Gait Pattern: Step-through pattern;Decreased stride length Gait velocity: decreased Stairs: No Wheelchair Mobility Wheelchair Mobility: No    PT Goals Acute Rehab PT Goals Pt will go Supine/Side to Sit: with modified independence;with HOB 0 degrees PT Goal: Supine/Side to Sit - Progress: Progressing toward goal Pt will go Sit to Stand: with modified independence PT Goal: Sit to Stand - Progress: Progressing toward goal Pt will go Stand to Sit: with modified independence PT Goal: Stand to Sit - Progress: Progressing toward goal Pt will Ambulate: >150 feet;with modified independence PT Goal: Ambulate - Progress: Progressing toward goal  Visit Information  Last PT Received On: 09/09/12 Assistance Needed: +1    Subjective Data  Subjective: "I would like to get up." Patient Stated Goal: to go home   Cognition  Cognition Arousal/Alertness: Awake/alert Behavior During Therapy: WFL for tasks assessed/performed Overall Cognitive Status: Within Functional Limits for tasks assessed    Balance     End of Session PT - End of Session Equipment Utilized During Treatment: Gait belt;Oxygen Activity Tolerance: Patient tolerated treatment well Patient left: in chair;with call bell/phone within reach Nurse Communication: Mobility status        INGOLD,Rheba Diamond 09/09/2012, 11:57 AM  [facility] [phone] (pager)

## 2012-09-09 NOTE — Progress Notes (Signed)
Patient in bed. Patient now on room air with no complaints of shortness of breath.  Will continue to monitor. Call bell near. [name] [name]

## 2012-09-09 NOTE — Progress Notes (Signed)
Clinical Social Work Department CLINICAL SOCIAL WORK PLACEMENT NOTE 09/09/2012  Patient:  [name] [name], [name] [name]  Account Number:  [id number] Admit date:  09/05/2012  Clinical Social Worker:  Robin Searing  Date/time:  09/09/2012 02:14 PM  Clinical Social Work is seeking post-discharge placement for this patient at the following level of care:   SKILLED NURSING   (*CSW will update this form in Epic as items are completed)   09/09/2012  Patient/family provided with [facility] Department of Clinical Social Work's list of facilities offering this level of care within the geographic area requested by the patient (or if unable, by the patient's family).  09/09/2012  Patient/family informed of their freedom to choose among providers that offer the needed level of care, that participate in Medicare, Medicaid or managed care program needed by the patient, have an available bed and are willing to accept the patient.  09/09/2012  Patient/family informed of [facility]' ownership interest in [facility], as well as of the fact that they are under no obligation to receive care at this facility.  PASARR submitted to EDS on 09/09/2012 PASARR number received from [employer] on 09/09/2012  FL2 transmitted to all facilities in geographic area requested by pt/family on  09/09/2012 FL2 transmitted to all facilities within larger geographic area on   Patient informed that his/her managed care company has contracts with or will negotiate with  certain facilities, including the following:     Patient/family informed of bed offers received:   Patient chooses bed at  Physician recommends and patient chooses bed at    Patient to be transferred to  on   Patient to be transferred to facility by   The following physician request were entered in Epic:   Additional Comments: Reece Levy, MSW, Theresia Majors [phone]

## 2012-09-09 NOTE — Progress Notes (Signed)
Pt ambulated 300 ft around the unit without oxygen. Pt had no complaints of dizziness, shortness of breath, or fatigue. Pt oxygen saturation was 94% after ambulation.

## 2012-09-09 NOTE — Progress Notes (Signed)
Patient out of bed to chair O2 sat 95% on 1.5L nasal cannula. Will continue to wean. VSS. Call bell near.[name] [name]

## 2012-09-09 NOTE — Progress Notes (Signed)
Clinical Social Work Department BRIEF PSYCHOSOCIAL ASSESSMENT 09/09/2012  Patient:  LATARRA, EAGLETON     Account Number:  [id number]     Admit date:  09/05/2012  Clinical Social Worker:  Robin Searing  Date/Time:  09/09/2012 02:06 PM  Referred by:  Physician  Date Referred:  09/09/2012 Referred for  SNF Placement   Other Referral:   Interview type:  Patient Other interview type:    PSYCHOSOCIAL DATA Living Status:  ALONE Admitted from facility:   Level of care:   Primary support name:  son Primary support relationship to patient:  FAMILY Degree of support available:   good    CURRENT CONCERNS Current Concerns  Post-Acute Placement   Other Concerns:    SOCIAL WORK ASSESSMENT / PLAN Met with patient who has been to SNF in the past and is open to it again if needed at time of this admission   Assessment/plan status:  Other - See comment Other assessment/ plan:   FL2 and [place] for completion for ?SNF   Information/referral to community resources:   SNF  [facility]  [facility]    PATIENT'S/FAMILY'S RESPONSE TO PLAN OF CARE: Patient agreeable to SNF search- would like to go back to [facility] if bed available-    Reece Levy, MSW, Theresia Majors [phone]

## 2012-09-10 ENCOUNTER — Inpatient Hospital Stay (HOSPITAL_COMMUNITY): Payer: Medicare Other

## 2012-09-10 DIAGNOSIS — B9689 Other specified bacterial agents as the cause of diseases classified elsewhere: Secondary | ICD-10-CM

## 2012-09-10 DIAGNOSIS — R0789 Other chest pain: Secondary | ICD-10-CM | POA: Diagnosis not present

## 2012-09-10 DIAGNOSIS — I498 Other specified cardiac arrhythmias: Secondary | ICD-10-CM

## 2012-09-10 DIAGNOSIS — J90 Pleural effusion, not elsewhere classified: Secondary | ICD-10-CM

## 2012-09-10 DIAGNOSIS — J9600 Acute respiratory failure, unspecified whether with hypoxia or hypercapnia: Secondary | ICD-10-CM

## 2012-09-10 DIAGNOSIS — R001 Bradycardia, unspecified: Secondary | ICD-10-CM | POA: Diagnosis not present

## 2012-09-10 LAB — BASIC METABOLIC PANEL
BUN: 18 mg/dL (ref 6–23)
BUN: 22 mg/dL (ref 6–23)
Chloride: 103 mEq/L (ref 96–112)
Creatinine, Ser: 1.16 mg/dL — ABNORMAL HIGH (ref 0.50–1.10)
GFR calc Af Amer: 87 mL/min — ABNORMAL LOW (ref >90)
GFR calc Af Amer: 48 mL/min — ABNORMAL LOW (ref >90)
GFR calc non Af Amer: 41 mL/min — ABNORMAL LOW (ref >90)
Glucose, Bld: 88 mg/dL (ref 70–99)
Potassium: 4.4 mEq/L (ref 3.5–5.1)
Sodium: 136 mEq/L (ref 135–145)

## 2012-09-10 LAB — GLUCOSE, CAPILLARY
Glucose-Capillary: 135 mg/dL — ABNORMAL HIGH (ref 70–99)
Glucose-Capillary: 136 mg/dL — ABNORMAL HIGH (ref 70–99)

## 2012-09-10 MED ORDER — DOPAMINE-DEXTROSE 3.2-5 MG/ML-% IV SOLN
5.0000 ug/kg/min | INTRAVENOUS | Status: DC
  Filled 2012-09-10: qty 250

## 2012-09-10 MED ORDER — WARFARIN SODIUM 1 MG PO TABS
1.0000 mg | ORAL_TABLET | Freq: Once | ORAL | Status: AC
  Administered 2012-09-10: 1 mg via ORAL
  Filled 2012-09-10: qty 1

## 2012-09-10 MED ORDER — SODIUM CHLORIDE 0.9 % IV SOLN
INTRAVENOUS | Status: DC
  Administered 2012-09-10: 15:00:00 via INTRAVENOUS

## 2012-09-10 MED ORDER — FUROSEMIDE 10 MG/ML IJ SOLN
40.0000 mg | Freq: Two times a day (BID) | INTRAMUSCULAR | Status: DC
  Administered 2012-09-10 – 2012-09-11 (×2): 40 mg via INTRAVENOUS
  Filled 2012-09-10 (×2): qty 4

## 2012-09-10 MED ORDER — SODIUM CHLORIDE 0.9 % IV BOLUS (SEPSIS)
500.0000 mL | Freq: Once | INTRAVENOUS | Status: AC
  Administered 2012-09-10: 500 mL via INTRAVENOUS

## 2012-09-10 MED ORDER — ASPIRIN EC 81 MG PO TBEC
81.0000 mg | DELAYED_RELEASE_TABLET | Freq: Every day | ORAL | Status: DC
  Administered 2012-09-11 – 2012-09-14 (×4): 81 mg via ORAL
  Filled 2012-09-10 (×4): qty 1

## 2012-09-10 MED ORDER — ATROPINE SULFATE 1 MG/ML IJ SOLN
1.0000 mg | Freq: Once | INTRAMUSCULAR | Status: AC
  Administered 2012-09-10: 1 mg via INTRAVENOUS
  Filled 2012-09-10: qty 1

## 2012-09-10 MED ORDER — ROPINIROLE HCL 0.5 MG PO TABS
0.5000 mg | ORAL_TABLET | Freq: Once | ORAL | Status: AC
  Administered 2012-09-10: 0.5 mg via ORAL
  Filled 2012-09-10: qty 1

## 2012-09-10 NOTE — Consult Note (Signed)
PULMONARY  / CRITICAL CARE MEDICINE  Name: [name] [name] MRN: [id number] DOB: September 13, 1924    ADMISSION DATE:  09/05/2012 CONSULTATION DATE:  6/18  REFERRING MD :  Janee Morn PRIMARY SERVICE: [facility]--->PCCM  CHIEF COMPLAINT:  Symptomatic bradycardia   BRIEF PATIENT DESCRIPTION:  58 yof admitted 6/14 w/ decompensated distolic dysfxn. Treated w/ diuresis, had right diagnostic/therapeutic thora on 6/16 (findings transudate), was doing well, slated to go home when during getting up to [facility] the am of 6/18 developed symptomatic bradycardia. Moved to ICU, cards called. PCCM asked to assume care,   SIGNIFICANT EVENTS / STUDIES:  ECHO  6/15: The estimated ejection fraction was in the range of 60% to 65%. Wall motion was normal. Pulmonary arteries: Systolic pressure was moderately increased. PA peak pressure: 62mm Hg (S  LINES / TUBES:   CULTURES: UC 6/13: klebsiella (pansens) BC x 2 6/14:>>> UC 6/16: neg  Pleural fluid 6/16>>>  ANTIBIOTICS: Rocephin 6/13>>>6/17 Cefuroxime 6/18>>>  HISTORY OF PRESENT ILLNESS:   79 year old female w/ h/o PEA arrest in Feb 2014,  admitted on 6/14 w/ weakness, fatigue felt to be d/t decomp diastolic HF. On presentation found to have mod sized right effusion. Rx included: diuresis, right diagnotic/therapeutic thoracentesis (findings c/w transudate), and treatment of klebsiella UTI. Had been improving. Was to go home on 6/18 however that morning developed acute onset symptomatic bradycardia (HR 30-40s, + CP and SOB). She was moved to ICU, cardiology called and PCCM asked to assume primary care while in unit.  PAST MEDICAL HISTORY :  Past Medical History  Diagnosis Date  . Hypertension   . Hyperlipidemia   . Type 2 diabetes mellitus   . Arthritis   . Coronary artery disease     a. Mild nonobstructive by cath 04/2012, cath complicated by groin hematoma.  . S/P mitral valve repair 05/20/2012    a. Severe MR - 04/2012: 26mm Sorin Memo 3D ring annuloplasty via right mini  thoracotomy approach  . S/P Maze operation for atrial fibrillation 05/20/2012    a. 04/2012: Complete bi-atrial lesion set using cryothermy  . Atrial fibrillation     a. Diagnosed 04/2012 in the setting of severe MR. b. s/p TEE/successful DCCV 06/06/12.  Marland Kitchen PEA (Pulseless electrical activity)     a. 04/2012 -> adm with afib, developed PEA, transferred to [facility] for prolonged hospitalization.   . Chronic diastolic CHF (congestive heart failure)    Past Surgical History  Procedure Laterality Date  . Abdominal hysterectomy    . Cholecystectomy    . Eye surgery    . Appendectomy    . Tee without cardioversion N/A 05/09/2012    Procedure: TRANSESOPHAGEAL ECHOCARDIOGRAM (TEE);  Surgeon: Dolores Patty, MD;  Location: [facility] ENDOSCOPY;  Service: Cardiovascular;  Laterality: N/A;  . Mitral valve repair Right 05/20/2012    Procedure: MINIMALLY INVASIVE MITRAL VALVE REPAIR (MVR);  Surgeon: Purcell Nails, MD;  Location[facility] OR;  Service: Open Heart Surgery;  Laterality: Right;  . Minimally invasive maze procedure N/A 05/20/2012    Procedure: MINIMALLY INVASIVE MAZE PROCEDURE;  Surgeon: Purcell Nails, MD;  Location: [facility] OR;  Service: Open Heart Surgery;  Laterality: N/A;  . Intraoperative transesophageal echocardiogram N/A 05/20/2012    Procedure: INTRAOPERATIVE TRANSESOPHAGEAL ECHOCARDIOGRAM;  Surgeon: Purcell Nails, MD;  Location: [facility] OR;  Service: Open Heart Surgery;  Laterality: N/A;  . Tee without cardioversion N/A 06/06/2012    Procedure: TRANSESOPHAGEAL ECHOCARDIOGRAM (TEE);  Surgeon: Dolores Patty, MD;  Location: [facility] ENDOSCOPY;  Service: Cardiovascular;  Laterality: N/A;  . Cardioversion N/A 06/06/2012    Procedure: CARDIOVERSION;  Surgeon: Dolores Patty, MD;  Location: [facility] ENDOSCOPY;  Service: Cardiovascular;  Laterality: N/A;   Prior to Admission medications   Medication Sig Start Date End Date Taking? Authorizing Provider  amiodarone (PACERONE) 200 MG tablet Take 1 tablet (200 mg total) by  mouth daily. 07/25/12  Yes Dolores Patty, MD  citalopram (CELEXA) 10 MG tablet Take 10 mg by mouth daily.   Yes Historical Provider, MD  glimepiride (AMARYL) 1 MG tablet Take 1 mg by mouth daily before breakfast.   Yes Historical Provider, MD  insulin glargine (LANTUS) 100 UNIT/ML injection Inject 18 Units into the skin at bedtime.   Yes Historical Provider, MD  magnesium oxide (MAG-OX) 400 MG tablet Take 1 tablet (400 mg total) by mouth 2 (two) times daily. 07/25/12  Yes Dolores Patty, MD  meclizine (ANTIVERT) 25 MG tablet Take 25 mg by mouth daily as needed for dizziness.   Yes Historical Provider, MD  metFORMIN (GLUCOPHAGE) 500 MG tablet Take 500 mg by mouth 2 (two) times daily with a meal.   Yes Historical Provider, MD  pravastatin (PRAVACHOL) 40 MG tablet Take 40 mg by mouth at bedtime. 07/25/12  Yes Bevelyn Buckles Bensimhon, MD  rOPINIRole (REQUIP) 0.5 MG tablet Take 0.5 mg by mouth at bedtime.   Yes Historical Provider, MD  traMADol (ULTRAM) 50 MG tablet Take 50-100 mg by mouth every 4 (four) hours as needed for pain. 05/27/12  Yes Donielle Margaretann Loveless, PA-C  traZODone (DESYREL) 50 MG tablet Take 50 mg by mouth at bedtime.   Yes Historical Provider, MD  warfarin (COUMADIN) 2 MG tablet Take 1-2 mg by mouth daily. Sat & Sun only-0.5 half tab (1 mg total); All other days 1 tab (2 mg total)   Yes Historical Provider, MD   Allergies  Allergen Reactions  . Codeine Nausea And Vomiting    FAMILY HISTORY:  Family History  Problem Relation Age of Onset  . CAD Father 40    Died of MI  . CAD Brother 56    Died of MI  . CAD Brother 2    Died of MI  . CAD Mother 51    Died of MI   SOCIAL HISTORY:  reports that she has never smoked. She has never used smokeless tobacco. She reports that she does not drink alcohol or use illicit drugs.  REVIEW OF SYSTEMS:  Review of Systems  Constitutional: No weight loss, gain, night sweats, Fevers, chills, fatigue .  HEENT: No headaches, visual changes,  Difficulty swallowing, Tooth/dental problems, or Sore throat,  No sneezing, itching, ear ache, nasal congestion, post nasal drip, no visual complaints CV: No chest pain, Orthopnea, PND, swelling in lower extremities, dizziness, palpitations, syncope. But did have transient chest pressure and SOB GI No heartburn, indigestion, abdominal pain, nausea, vomiting, diarrhea, change in bowel habits, loss of appetite, bloody stools.  Resp: No cough, No coughing up of blood. No change in color of mucus. No wheezing.  Skin: no rash or itching or icterus GU: no dysuria, change in color of urine, no urgency or frequency. No flank pain, no hematuria  MS: No joint pain or swelling. No decreased range of motion  Psych: No change in mood or affect. No depression or anxiety.  Neuro: no difficulty with speech, weakness, numbness, ataxia    SUBJECTIVE:  Feels a little better  VITAL SIGNS: Temp:  [97.8 F (36.6 C)-98.7 F (37.1 C)]  97.8 F (36.6 C) (06/18 0700) Pulse Rate:  [35-97] 43 (06/18 1315) Resp:  [12-18] 15 (06/18 1315) BP: (94-145)/(33-84) 114/35 mmHg (06/18 1315) SpO2:  [91 %-99 %] 97 % (06/18 1315) Weight:  [65.6 kg (144 lb 10 oz)] 65.6 kg (144 lb 10 oz) (06/18 0514) HEMODYNAMICS:   VENTILATOR SETTINGS:   INTAKE / OUTPUT: Intake/Output     06/17 0701 - 06/18 0700 06/18 0701 - 06/19 0700   P.O. 700    Total Intake(mL/kg) 700 (10.7)    Urine (mL/kg/hr) 300 (0.2) 250 (0.6)   Stool  1 (0)   Total Output 300 251   Net +400 -251        Urine Occurrence 1 x    Stool Occurrence 1 x      PHYSICAL EXAMINATION: General:  eld white female in no acute distress.  Neuro:  Awake, alert, no distress no focal def  HEENT:  [place], no JVD Cardiovascular:  rrr Lungs:  Crackles in bases  Abdomen:  Soft, non-tender + bowel sounds no OM  Musculoskeletal:  Intact  Skin:  Scattered areas of ecchymosis   LABS:  Recent Labs Lab 09/08/12 0515 09/09/12 0435 09/10/12 0540  NA 138 139 136  K 3.2* 4.0  4.4  CL 101 104 103  CO2 29 28 26   BUN 14 19 18   CREATININE 0.77 0.81 0.73  GLUCOSE 85 77 88    Recent Labs Lab 09/06/12 0550 09/07/12 0355 09/09/12 0435  HGB 11.9* 11.3* 10.6*  HCT 35.7* 34.2* 31.9*  WBC 6.2 6.2 4.7  PLT 250 255 254      Recent Labs Lab 09/09/12 1117 09/09/12 1631 09/09/12 2133 09/10/12 0616 09/10/12 1116  GLUCAP 125* 86 96 89 135*    CXR: RLL vol loss effusion/atx   ASSESSMENT / PLAN:  PULMONARY A: dyspnea: d/t bradycardia      Right pleural effusion/ recurrent. Sample on 6/16 was transudate     Elevated right hemidiaphragm Appears to have some chronicity  P:   Supplement oxygen  rx bradycardia  Diuresis when hemodynamics allow   CARDIOVASCULAR A:  Symptomatic bradycardia (junctional rhythm) Decompensated diastolic HF H/o AF/Aflutter s/p MAZE Prior PEA arrest  Severe MR s/p MV repair  Cards has seen  P:  Cycle enzymes Hold rate controlling meds Dopamine gtt Leave left side open for pacer if needed Cont coumadin per pharmacy  Further recs per cards   RENAL A:  No acute issue  P:   Trend   GASTROINTESTINAL A:  No acute issue  P:   Diet as tolerated when hemodynamics allow   HEMATOLOGIC A:   ACD. No evidence of bleeding  P:  Trend INR Trend CBC   INFECTIOUS A:  Klebsiella UTI P:   Last UC neg. So d/c abx   ENDOCRINE A:  No acute issue  P:   Trend CBC   NEUROLOGIC A:  No acute issue  P:   rx as indicated   TODAY'S SUMMARY:  This is a 79 year old female now in ICU after developing symptomatic bradycardia after developing Junctional rhythm. Moved to ICU for further support and dopamine gtt. Will hold rate controlling meds, place dopamine gtt. Was on coreg and amio   I have personally obtained a history, examined the patient, evaluated laboratory and imaging results, formulated the assessment and plan and placed orders. CRITICAL CARE: The patient is critically ill with multiple organ systems failure and  requires high complexity decision making for assessment and support, frequent  evaluation and titration of therapies, application of advanced monitoring technologies and extensive interpretation of multiple databases. Critical Care Time devoted to patient care services described in this note is --- minutes.    Pulmonary and Critical Care Medicine [facility] Pager: [phone]  09/10/2012, 1:38 PM   Billy Fischer, MD ; [facility] service Mobile [phone].  After 5:30 PM or weekends, call [phone]

## 2012-09-10 NOTE — Progress Notes (Signed)
1015 tele alarm HR brady 44; pt up to [facility] with NT at this time; pt states she feels tired; pt with BM; pt assisted back to bed; HR now down to 34 and sustaining; EKG done at this time shows junctional brady with HR 35; BP 110/50 at this time; Dr. Janee Morn made aware; MD given EKG; pt in bed with bed alarm on; pt still states she feels very weak and tired; pt was given Coreg dose this AM at 0848; HR this AM was 70's; will cont. To monitor.

## 2012-09-10 NOTE — Progress Notes (Signed)
eLink Physician-Brief Progress Note Patient Name: Salimah Martinovich DOB: [birth year] MRN: [id number]  Date of Service  09/10/2012   HPI/Events of Note   ARF on chem , K up , pcxr edema cvp 15, low output  eICU Interventions  Laisx, bmet k repeat   Intervention Category Major Interventions: Acute renal failure - evaluation and management  FEINSTEIN,DANIEL J. 09/10/2012, 10:42 PM

## 2012-09-10 NOTE — Consult Note (Signed)
ELECTROPHYSIOLOGY CONSULT NOTE    Patient ID: [name] [name] MRN: [id number], DOB/AGE: 77-Oct-1926 77 y.o.  Admit date: 09/05/2012 Date of Consult: 09-10-2012  Primary Physician: Marylen Ponto, MD Primary Cardiologist: Nicholes Mango, MD  Reason for Consultation: junctional rhythm  HPI:  [name] [name] is a 79 year old female whom we have been asked to see for junctional rhythm.  Her past medical history is notable for hypertension, hyperlipidemia, type II diabetes, nonobstructive CAD by cath in February of this year, severe MR, s/p MVR in 2/14 with MAZE at that time, and chronic diastolic heart failure.  In February of this year, she had a PEA arrest at an outside hospital, was transferred to [facility]. She was in afib with RVR following her arrest.  She was found to have a normal EF and severe MR due to annular dilatation.  She underwent minimally invasive mitral valve repair along with a MAZE at that time.  She was discharged home on Amiodarone.  In March of this year, she had recurrent atrial flutter and underwent cardioversion.  In April of this year, she was found to be bradycardic and her Lopressor was discontinued.   Her current admission was prompted by weakness and fatigue.  She was found to have a right pleural effusion that has been drained this admission. She had been making good progress and plans were for discharge to SNF.  She got up to go to the restroom today and abruptly became bradycardic.  Telemetry demonstrated junctional rhythm.  She is also hypotensive and symptomatic with chest and left arm pressure.  Current medications include Coreg 3.125mg  bid and Amiodarone 200mg  daily.   Past Medical History  Diagnosis Date  . Hypertension   . Hyperlipidemia   . Type 2 diabetes mellitus   . Arthritis   . Coronary artery disease     a. Mild nonobstructive by cath 04/2012, cath complicated by groin hematoma.  . S/P mitral valve repair 05/20/2012    a. Severe MR - 04/2012: 26mm Sorin  Memo 3D ring annuloplasty via right mini thoracotomy approach  . S/P Maze operation for atrial fibrillation 05/20/2012    a. 04/2012: Complete bi-atrial lesion set using cryothermy  . Atrial fibrillation     a. Diagnosed 04/2012 in the setting of severe MR. b. s/p TEE/successful DCCV 06/06/12.  Marland Kitchen PEA (Pulseless electrical activity)     a. 04/2012 -> adm with afib, developed PEA, transferred to [facility] for prolonged hospitalization.   . Chronic diastolic CHF (congestive heart failure)      Surgical History:  Past Surgical History  Procedure Laterality Date  . Abdominal hysterectomy    . Cholecystectomy    . Eye surgery    . Appendectomy    . Tee without cardioversion N/A 05/09/2012    Procedure: TRANSESOPHAGEAL ECHOCARDIOGRAM (TEE);  Surgeon: Dolores Patty, MD;  Location: [facility] ENDOSCOPY;  Service: Cardiovascular;  Laterality: N/A;  . Mitral valve repair Right 05/20/2012    Procedure: MINIMALLY INVASIVE MITRAL VALVE REPAIR (MVR);  Surgeon: Purcell Nails, MD;  Location: [facility] OR;  Service: Open Heart Surgery;  Laterality: Right;  . Minimally invasive maze procedure N/A 05/20/2012    Procedure: MINIMALLY INVASIVE MAZE PROCEDURE;  Surgeon: Purcell Nails, MD;  Location: [facility] OR;  Service: Open Heart Surgery;  Laterality: N/A;  . Intraoperative transesophageal echocardiogram N/A 05/20/2012    Procedure: INTRAOPERATIVE TRANSESOPHAGEAL ECHOCARDIOGRAM;  Surgeon: Purcell Nails, MD;  Location: [facility] OR;  Service: Open Heart Surgery;  Laterality: N/A;  .  Tee without cardioversion N/A 06/06/2012    Procedure: TRANSESOPHAGEAL ECHOCARDIOGRAM (TEE);  Surgeon: Dolores Patty, MD;  Location: [facility] ENDOSCOPY;  Service: Cardiovascular;  Laterality: N/A;  . Cardioversion N/A 06/06/2012    Procedure: CARDIOVERSION;  Surgeon: Dolores Patty, MD;  Location: [facility] ENDOSCOPY;  Service: Cardiovascular;  Laterality: N/A;     Prescriptions prior to admission  Medication Sig Dispense Refill  . amiodarone (PACERONE) 200 MG  tablet Take 1 tablet (200 mg total) by mouth daily.  30 tablet  6  . citalopram (CELEXA) 10 MG tablet Take 10 mg by mouth daily.      Marland Kitchen glimepiride (AMARYL) 1 MG tablet Take 1 mg by mouth daily before breakfast.      . insulin glargine (LANTUS) 100 UNIT/ML injection Inject 18 Units into the skin at bedtime.      . magnesium oxide (MAG-OX) 400 MG tablet Take 1 tablet (400 mg total) by mouth 2 (two) times daily.  60 tablet  6  . meclizine (ANTIVERT) 25 MG tablet Take 25 mg by mouth daily as needed for dizziness.      . metFORMIN (GLUCOPHAGE) 500 MG tablet Take 500 mg by mouth 2 (two) times daily with a meal.      . pravastatin (PRAVACHOL) 40 MG tablet Take 40 mg by mouth at bedtime.      Marland Kitchen rOPINIRole (REQUIP) 0.5 MG tablet Take 0.5 mg by mouth at bedtime.      . traMADol (ULTRAM) 50 MG tablet Take 50-100 mg by mouth every 4 (four) hours as needed for pain.      . traZODone (DESYREL) 50 MG tablet Take 50 mg by mouth at bedtime.      Marland Kitchen warfarin (COUMADIN) 2 MG tablet Take 1-2 mg by mouth daily. Sat & Sun only-0.5 half tab (1 mg total); All other days 1 tab (2 mg total)        Inpatient Medications:  . aspirin EC  325 mg Oral Daily  . cefUROXime  500 mg Oral BID WC  . citalopram  10 mg Oral Daily  . cyanocobalamin  1,000 mcg Subcutaneous Q1500  . furosemide  40 mg Oral Daily  . insulin aspart  0-5 Units Subcutaneous QHS  . insulin aspart  0-9 Units Subcutaneous TID WC  . insulin glargine  18 Units Subcutaneous QHS  . lisinopril  2.5 mg Oral Daily  . magnesium oxide  400 mg Oral BID  . mupirocin ointment  1 application Nasal BID  . potassium chloride  40 mEq Oral Daily  . rOPINIRole  0.5 mg Oral QHS  . simvastatin  20 mg Oral q1800  . Warfarin - Pharmacist Dosing Inpatient   Does not apply q1800    Allergies:  Allergies  Allergen Reactions  . Codeine Nausea And Vomiting    History   Social History  . Marital Status: Widowed    Spouse Name: N/A    Number of Children: 2  . Years  of Education: N/A   Occupational History  . Not on file.   Social History Main Topics  . Smoking status: Never Smoker   . Smokeless tobacco: Never Used  . Alcohol Use: No  . Drug Use: No  . Sexually Active: Not on file   Other Topics Concern  . Not on file   Social History Narrative   Lives at home alone.      Family History  Problem Relation Age of Onset  . CAD Father 34  Died of MI  . CAD Brother 65    Died of MI  . CAD Brother 59    Died of MI  . CAD Mother 61    Died of MI    BP 140/78  Pulse 45  Temp(Src) 97.8 F (36.6 C) (Oral)  Resp 18  Ht 5' 3.5" (1.613 m)  Wt 144 lb 10 oz (65.6 kg)  BMI 25.21 kg/m2  SpO2 98% Well developed and nourished in moderate distress HENT normal Neck supple with JVP-flat Clear Slow butt Regular rate and rhythm, no murmurs or gallops Abd-soft with active BS No Clubbing cyanosis edema Skin-warm and dry A & Oriented  Grossly normal sensory and motor function   Labs:   Lab Results  Component Value Date   WBC 4.7 09/09/2012   HGB 10.6* 09/09/2012   HCT 31.9* 09/09/2012   MCV 92.7 09/09/2012   PLT 254 09/09/2012    Recent Labs Lab 09/05/12 2021  09/08/12 1702  09/10/12 0540  NA 134*  < >  --   < > 136  K 4.1  < >  --   < > 4.4  CL 100  < >  --   < > 103  CO2 27  < >  --   < > 26  BUN 27*  < >  --   < > 18  CREATININE 0.83  < >  --   < > 0.73  CALCIUM 9.6  < >  --   < > 8.8  PROT 6.4  --  6.3  --   --   BILITOT 0.8  --   --   --   --   ALKPHOS 91  --   --   --   --   ALT 21  --   --   --   --   AST 29  --   --   --   --   GLUCOSE 192*  < >  --   < > 88  < > = values in this interval not displayed.   Radiology/Studies: Dg Chest 1 View 09/08/2012   *RADIOLOGY REPORT*  Clinical Data: Status post right thoracentesis.  CHEST - 1 VIEW  Comparison: 09/05/2012.  Findings: The previously seen right pleural fluid is no longer demonstrated.  No pneumothorax.  Decreased linear density at the right lung base.  Increased linear  and patchy density at the left lung base.  The cardiac silhouette remains borderline enlarged. The interstitial markings remain mildly prominent.  Bilateral shoulder degenerative changes and thoracic spine degenerative changes.  Bilateral subcoracoid bursa calcified loose bodies.  IMPRESSION:  1.  Resolved pleural fluid on the right with no pneumothorax following thoracentesis. 2.  Decreased right basilar atelectasis. 3.  Increased left basilar atelectasis and possible pneumonia. 4.  Stable mild chronic interstitial lung disease.   Original Report Authenticated By: Beckie Salts, M.D.   EKG: 6-13 sinus rhythm with normal intervals; 6-18 junctional rhythm rate 37 with narrow QRS  TELEMETRY: sinus rhythm rate 60-70 with subsequent junctional rhythm in the 30's Principal Problem:   Pleural effusion Active Problems:   Diabetes type 2, uncontrolled   Chronic diastolic heart failure   Hypoxemia   UTI (lower urinary tract infection)   Bradycardia   Chest pressure  The pt this am abruptly developed sinus node arrest with subsequent junctional bradycardia.  This occurs in the setting of amio and coreg used for afib RVR which developed post operative MAZE and MV  ring/repair  I do not see atrial fibrillation following her cardioversion in April the postoperative phase. I would be inclined to discontinue her amiodarone and her beta blocker and follow her. In the event that she has recurrent atrial fibrillation with rapid rates I would then pursue pacing for bradycardiac support.  Will follow

## 2012-09-10 NOTE — Progress Notes (Signed)
Dr. Janee Morn at bedside; pt now c/o pressure in L chest that radiates down L arm; EKG done at this time; order for IV Atropine; will cont. To monitor.

## 2012-09-10 NOTE — Progress Notes (Signed)
Patient Name: [name] [name]      SUBJECTIVE:   79 y/o withPEA arrest in 2/14 She was transferred to [facility]. Found to have new onset AF with RVR with normal EF and severe MR due to annular dilation. She underwent heart cath which showed non-obstructive CAD. Unfortunately this was complicated by a large left groin bleed. She eventually underwent minimally Invasive Mitral Valve Repair utilizing a 26 mm Sorin Memo 3D ring for annuloplasty and a MAZE Procedure on 05/20/12. Was discharged on 05/27/2012. Aflutter s/p DCCV and amio decreased subsequently 2/2 bradycardia  Admitted with weakness and altered mental status and found also to be hypoxic:  CXR mod R effusion   Mental status improved with treatment of klebsiella UTI. Underwent thoracentesis of pleural effusion 500 cc.  This am developed junctional bradycardia with HRs in 30s with CP. Given atropine without response. Transferred to ICU. Central line placed by CCM. HRs now in 40s and asx. BP stable.    Past Medical History  Diagnosis Date  . Hypertension   . Hyperlipidemia   . Type 2 diabetes mellitus   . Arthritis   . Coronary artery disease     a. Mild nonobstructive by cath 04/2012, cath complicated by groin hematoma.  . S/P mitral valve repair 05/20/2012    a. Severe MR - 04/2012: 26mm Sorin Memo 3D ring annuloplasty via right mini thoracotomy approach  . S/P Maze operation for atrial fibrillation 05/20/2012    a. 04/2012: Complete bi-atrial lesion set using cryothermy  . Atrial fibrillation     a. Diagnosed 04/2012 in the setting of severe MR. b. s/p TEE/successful DCCV 06/06/12.  Marland Kitchen PEA (Pulseless electrical activity)     a. 04/2012 -> adm with afib, developed PEA, transferred to [facility] for prolonged hospitalization.   . Chronic diastolic CHF (congestive heart failure)     PHYSICAL EXAM Filed Vitals:   09/10/12 1445 09/10/12 1500 09/10/12 1515 09/10/12 1530  BP: 148/135 102/54 126/65 100/15  Pulse: 43 44 44 41  Temp:      TempSrc:        Resp: 15 17 16 15   Height:      Weight:      SpO2: 97% 96% 97% 96%   General: Lying flat in bed No resp difficulty  HEENT: normal  Neck: supple. RIJ central line Carotids 2+ bilaterally; no bruits. No lymphadenopathy or thryomegaly appreciated.  Cor: PMI normal. Regular bradycardic No rubs, gallops or murmurs.  Lungs: clear  Abdomen: soft, nontender, nondistended. No hepatosplenomegaly. No bruits or masses. Good bowel sounds.  Extremities: no cyanosis, clubbing, rash, no edema  Neuro: alert & orientedx3, cranial nerves grossly intact. Moves all 4 extremities w/o difficulty. Affect pleasant.  TELEMETRY: junctional brady 40s    Intake/Output Summary (Last 24 hours) at 09/10/12 1608 Last data filed at 09/10/12 1055  Gross per 24 hour  Intake    240 ml  Output    251 ml  Net    -11 ml    LABS: Basic Metabolic Panel:  Recent Labs Lab 09/05/12 2021 09/06/12 0550 09/07/12 0355 09/08/12 0515 09/09/12 0435 09/10/12 0540  NA 134* 136 135 138 139 136  K 4.1 3.7 3.7 3.2* 4.0 4.4  CL 100 103 100 101 104 103  CO2 27 23 24 29 28 26   GLUCOSE 192* 108* 107* 85 77 88  BUN 27* 24* 19 14 19 18   CREATININE 0.83 0.83 0.85 0.77 0.81 0.73  CALCIUM 9.6 8.9 8.7 9.0 8.6 8.8  Cardiac Enzymes: No results found for this basename: CKTOTAL, CKMB, CKMBINDEX, TROPONINI,  in the last 72 hours CBC:  Recent Labs Lab 09/05/12 2021 09/06/12 0550 09/07/12 0355 09/09/12 0435  WBC 6.7 6.2 6.2 4.7  NEUTROABS 5.1  --   --   --   HGB 12.3 11.9* 11.3* 10.6*  HCT 36.1 35.7* 34.2* 31.9*  MCV 91.9 92.5 92.2 92.7  PLT 256 250 255 254   PROTIME:  Recent Labs  09/08/12 0515 09/09/12 0435 09/10/12 0540  LABPROT 28.5* 24.1* 22.7*  INR 2.86* 2.28* 2.10*   Liver Function Tests:  Recent Labs  09/08/12 1702  PROT 6.3  ALBUMIN 2.8*   No results found for this basename: LIPASE, AMYLASE,  in the last 72 hours BNP: BNP (last 3 results)  Recent Labs  05/06/12 0229 05/13/12 0535  09/06/12 0302  PROBNP 3044.0* 1125.0* 3823.0*   ecg without significant abnormality  ASSESSMENT:  1) Klebsiella UTI with acute delirium and hypoxia 2) R pleural effusion  3) Chronic diastolic HF 4) AF/AFL s/p Maze 5) Severe MR s/p MV repair 6) junctional bradycardia with narrow QRS - tachy-brady syndrome   PLAN:  HR now slightly improved but still in junctional rhythm. BP stable. Carvedilol and amio on hold. EP has seen for consideration of pacer given junctional brady with recent h/o AFL with RVR requiring amio. Will await their recs. Can use dopamine for support as needed.   Appreciate EF and CCM care.   Migdalia Dk MD  09/10/2012

## 2012-09-10 NOTE — Progress Notes (Signed)
No change in HR after atropine; HR maintained 35; will cont. To monitor.

## 2012-09-10 NOTE — Progress Notes (Addendum)
ANTICOAGULATION CONSULT NOTE - Follow Up Consult  Pharmacy Consult for Coumadin Indication: atrial fibrillation  Allergies  Allergen Reactions  . Codeine Nausea And Vomiting    Patient Measurements: Height: 5' 3.5" (161.3 cm) Weight: 144 lb 10 oz (65.6 kg) IBW/kg (Calculated) : 53.55  Vital Signs: Temp: 97.8 F (36.6 C) (06/18 0700) Temp src: Oral (06/18 0700) BP: 94/51 mmHg (06/18 1144) Pulse Rate: 35 (06/18 1147)  Labs:  Recent Labs  09/08/12 0515 09/09/12 0435 09/10/12 0540  HGB  --  10.6*  --   HCT  --  31.9*  --   PLT  --  254  --   LABPROT 28.5* 24.1* 22.7*  INR 2.86* 2.28* 2.10*  CREATININE 0.77 0.81 0.73    Estimated Creatinine Clearance: 46.5 ml/min (by C-G formula based on Cr of 0.73).  Assessment: 88 yof on chronic coumadin for afib, admitted 09/05/2012  With SOB, weakness sats in 80s, Found with UTI and R pleural effusion.  Pharmacy consulted to dose warfarin.  Events: 09/10/2012 stable for DC then episode of bradycardia (HR 34), patient weak with CP.  Transferred to 2900  Home dose 2mg  daily except 1mg  on Sat/Sun  Anticoagulation: Pt on coumadin PTA for afib. INR at goal, once stable post transfer to ICU follow up plan for warfarin  Infectious Disease: RLL infiltrate + UTI, afebrile, WBC wnl  6/14 PO azith>>6/15 6/14 CTX>> 6/17 6/17 cefuroxime  6/13 urine>> > 100K colonies of klebsiella pneumoniae (pan sens, except for Amp) 6/14 blood>> ng 6/16 Pleural fluid >> 6/16 urine >> neg (F) MRSA PCR positive  Cardiovascular: HTN, HLD, CAD, afib, s/p maze MVR 04/2012, diast HF, PEA arrest 2/14, EF 60-65%, pending echo to eval valve issue, BP wnl, HR 30-60ss Meds: Amiodarone, ASA 325mg , Coreg, lisinopril, Zocor, furo 40 daily, K 40 daily, MagOx 400 bid,   Endocrinology: DM2 , A1c 7.4%, on SSI, Lantus 18/hs, CBGs < 150 Gastrointestinal / Nutrition: heart diet  Neurology: Celexa (1% incidence of bradycardia), Requip (<1% incidence of  bradycardia)  Nephrology: sCr 0.81 (stable), CrCl 45, lytes good.  Pulmonary: Pleural effusion w/ acute hypoxic resp failure, s/p thoracentesis, respiratory status improved, on RA now.  Hematology / Oncology: Hgb slow trend down 10.6, plts stable  PTA Medication Issues: glimeperide/metformin  Best Practices: coumadin  Goal of Therapy:  INR 2-3 Monitor platelets by anticoagulation protocol: Yes   Plan:  - Follow up warfarin plan, bradycardia workup in progress - Follow up INR in AM   Thank you for allowing pharmacy to be a part of this patients care team.  Lovenia Kim Pharm.D., BCPS Clinical Pharmacist 09/10/2012 12:11 PM Pager: [phone] Phone: [phone]  3:54 PM Spoke with Dr. Graciela Husbands ok to continue warfarin.  Will continue with 1 mg warfarin tonight. Jacquees Gongora Christine Virginia Crews

## 2012-09-10 NOTE — Progress Notes (Signed)
[facility] PROGRESS NOTE  [name] [name] ZOX:[id number] DOB: 1925-02-25 DOA: 09/05/2012 PCP: Marylen Ponto, MD  Assessment/Plan: #1 bradycardia EKG showing junctional bradycardia. Patient currently on Coreg and amiodarone with a history of atrial fibrillation.CBC does have a hemoglobin of 10.6 however relatively stable. Basic metabolic profile within normal limits. Will repeat EKG. Check a chest x-ray.TSH on 09/01/2012 was 3.598.we'll give a dose of atropine 1 mg IV x1 and follow. Will D/C Coreg and amiodarone for now. Cardiology has been notified and will reassess the patient. If no improvement we'll transfer to step down unit. May need a pacemaker.  #2 chest pressure/pain Questionable etiology. Pain is nonreproducible. Will repeat EKG. Check a chest x-ray. Per cardiology patient with recent cardiac catheterization which showed nonobstructive coronary artery disease in February of 2014. Doubt cardiac in nature. Treat symptomatically and follow.  #3Pleural effusion w/ acute hypoxic resp failure  Review of x-rays suggest that this has been slowly progressive since 09/01/2012 - accurate assessment of volume is complicated by the fact the patient has a chronically elevated R hemidiaphragm - there is no other clear etiology to her hypoxia at this time (patient does not normally require oxygen at home) -  -pt seen by Dr. Teressa Lower /heart failure team and patient is status post thoracentesis per IR on 09/08/2012 with clinical improvement., INR therapeutic at 2.10 this am so will resume Coumadin for now  #4?RLL PNA  WBC not elevated - pt afebrile - doubt PNA  #5 well-controlledDiabetes type 2, CBG reasonably controlled at this time range of from 89-135.hemoglobin A1c 5.4.   #6 Chronic diastolic heart failure  Patient was diuresed with oral Lasix 40 twice a day for 2 days and it was DC'd, per cardiology Lasix to be resumed if weight 147 pounds so greater. Weight today is 144 pounds. #7Severe MR s/p  minimally invasive MVR Feb 2014  Echocardiogram pending to evaluate valve  -echo as below with EF 60-65% and moderate mitral stenosis with mild to moderate regurgitation, cardiology to follow further/?TCTs consult needed  #8 Klebsiella UTI  Was on Rocephin start on 09/05/2012. Started on oral Ceftin today to complete a ten-day course of antibiotic therapy. #9 HLD  Continue Zocor  #10HTN  Blood pressure is borderline with bradycardia. Will DC Coreg and amiodarone. If blood pressure doesn't improve we'll discontinue blood pressure regimen at this time. #11Hx of PEA arrest Feb 2014  #12Mild nonobstructive CAD via cardiac cath Feb 2014  #13Hx of afib s/p Maze Feb 2014 w/ subsequent recurrence s/p TEE/DCC 06/06/12 . D/C amiodarone and Coreg secondary to problem #1. Cardiology following.  #14 Normocytic anemia  Anemia panel reveals mild iron deficiency - initiate iron replacement once patient more ambulatory  #15 MRSA screen +  Usual contact precautions  #16 Hypokalemia  -repleted.   Code Status: full Family Communication: updated patient no family at bedside. Disposition Plan: currently inpatient. The skilled nursing facility when medically stable.   Consultants:  Cardiology: Dr. Gala Romney  Procedures:  2-D echo 09/07/2012  Ultrasound-guided thoracentesis 09/08/2012 per interventional radiology.  Antibiotics: Rocephin 6/13 >> 6/17 Azithromycin 6/14>>6/15  Cefuroxime 6/13   HPI/Subjective: Patient complaining of left chest pressure radiating to the left upper extremity. Patient states doesn't feel too well. Per nursing patient with heart rate sustaining in the 30s after receiving the Coreg and after coming from the bathroom.  Objective: Filed Vitals:   09/09/12 2148 09/10/12 0514 09/10/12 0700 09/10/12 0956  BP: 118/55 138/65 127/67 124/72  Pulse: 76 74 97   Temp:  98.7 F (37.1 C) 98.7 F (37.1 C) 97.8 F (36.6 C)   TempSrc: Oral Oral Oral   Resp: 18 18    Height:       Weight:  65.6 kg (144 lb 10 oz)    SpO2: 91% 96%      Intake/Output Summary (Last 24 hours) at 09/10/12 1122 Last data filed at 09/10/12 1055  Gross per 24 hour  Intake    460 ml  Output    251 ml  Net    209 ml   Filed Weights   09/08/12 0429 09/09/12 0511 09/10/12 0514  Weight: 65.091 kg (143 lb 8 oz) 67 kg (147 lb 11.3 oz) 65.6 kg (144 lb 10 oz)    Exam:   General:  NAD  Cardiovascular: bradycardia, no murmurs rubs or gallops  Respiratory: clear to auscultation bilaterally. No wheezing.  Abdomen: soft, nontender, nondistended, positive bowel sounds  Extremities: No clubbing cyanosis or edema.  Data Reviewed: Basic Metabolic Panel:  Recent Labs Lab 09/06/12 0550 09/07/12 0355 09/08/12 0515 09/09/12 0435 09/10/12 0540  NA 136 135 138 139 136  K 3.7 3.7 3.2* 4.0 4.4  CL 103 100 101 104 103  CO2 23 24 29 28 26   GLUCOSE 108* 107* 85 77 88  BUN 24* 19 14 19 18   CREATININE 0.83 0.85 0.77 0.81 0.73  CALCIUM 8.9 8.7 9.0 8.6 8.8   Liver Function Tests:  Recent Labs Lab 09/05/12 2021 09/08/12 1702  AST 29  --   ALT 21  --   ALKPHOS 91  --   BILITOT 0.8  --   PROT 6.4 6.3  ALBUMIN 3.2* 2.8*   No results found for this basename: LIPASE, AMYLASE,  in the last 168 hours No results found for this basename: AMMONIA,  in the last 168 hours CBC:  Recent Labs Lab 09/05/12 2021 09/06/12 0550 09/07/12 0355 09/09/12 0435  WBC 6.7 6.2 6.2 4.7  NEUTROABS 5.1  --   --   --   HGB 12.3 11.9* 11.3* 10.6*  HCT 36.1 35.7* 34.2* 31.9*  MCV 91.9 92.5 92.2 92.7  PLT 256 250 255 254   Cardiac Enzymes: No results found for this basename: CKTOTAL, CKMB, CKMBINDEX, TROPONINI,  in the last 168 hours BNP (last 3 results)  Recent Labs  05/06/12 0229 05/13/12 0535 09/06/12 0302  PROBNP 3044.0* 1125.0* 3823.0*   CBG:  Recent Labs Lab 09/09/12 0618 09/09/12 1117 09/09/12 1631 09/09/12 2133 09/10/12 0616  GLUCAP 101* 125* 86 96 89    Recent Results (from  the past 240 hour(s))  URINE CULTURE     Status: None   Collection Time    09/05/12 10:22 PM      Result Value Range Status   Specimen Description URINE, RANDOM   Final   Special Requests NONE   Final   Culture  Setup Time 09/06/2012 16:58   Final   Colony Count >=100,000 COLONIES/ML   Final   Culture KLEBSIELLA PNEUMONIAE   Final   Report Status 09/08/2012 FINAL   Final   Organism ID, Bacteria KLEBSIELLA PNEUMONIAE   Final  MRSA PCR SCREENING     Status: Abnormal   Collection Time    09/06/12  4:52 AM      Result Value Range Status   MRSA by PCR POSITIVE (*) NEGATIVE Final   Comment:            The GeneXpert MRSA Assay (FDA     approved for NASAL  specimens     only), is one component of a     comprehensive MRSA colonization     surveillance program. It is not     intended to diagnose MRSA     infection nor to guide or     monitor treatment for     MRSA infections.     RESULT CALLED TO, READ BACK BY AND VERIFIED WITH:     LINDSAY J.,RN 09/06/12 0818 BY JONESJ  CULTURE, BLOOD (ROUTINE X 2)     Status: None   Collection Time    09/06/12  5:50 AM      Result Value Range Status   Specimen Description BLOOD LEFT ARM   Final   Special Requests BOTTLES DRAWN AEROBIC AND ANAEROBIC 10CC EACH   Final   Culture  Setup Time 09/06/2012 13:30   Final   Culture     Final   Value:        BLOOD CULTURE RECEIVED NO GROWTH TO DATE CULTURE WILL BE HELD FOR 5 DAYS BEFORE ISSUING A FINAL NEGATIVE REPORT   Report Status PENDING   Incomplete  CULTURE, BLOOD (ROUTINE X 2)     Status: None   Collection Time    09/06/12  5:55 AM      Result Value Range Status   Specimen Description BLOOD LEFT ARM   Final   Special Requests BOTTLES DRAWN AEROBIC AND ANAEROBIC 10CC EACH   Final   Culture  Setup Time 09/06/2012 13:30   Final   Culture     Final   Value:        BLOOD CULTURE RECEIVED NO GROWTH TO DATE CULTURE WILL BE HELD FOR 5 DAYS BEFORE ISSUING A FINAL NEGATIVE REPORT   Report Status PENDING    Incomplete  BODY FLUID CULTURE     Status: None   Collection Time    09/08/12  3:01 PM      Result Value Range Status   Specimen Description PLEURAL FLUID RIGHT   Final   Special Requests 60 ML FLUID   Final   Gram Stain     Final   Value: NO WBC SEEN     NO ORGANISMS SEEN   Culture NO GROWTH 1 DAY   Final   Report Status PENDING   Incomplete  URINE CULTURE     Status: None   Collection Time    09/08/12  5:04 PM      Result Value Range Status   Specimen Description URINE, CLEAN CATCH   Final   Special Requests Normal   Final   Culture  Setup Time 09/08/2012 18:45   Final   Colony Count NO GROWTH   Final   Culture NO GROWTH   Final   Report Status 09/09/2012 FINAL   Final     Studies: Dg Chest 1 View  09/08/2012   *RADIOLOGY REPORT*  Clinical Data: Status post right thoracentesis.  CHEST - 1 VIEW  Comparison: 09/05/2012.  Findings: The previously seen right pleural fluid is no longer demonstrated.  No pneumothorax.  Decreased linear density at the right lung base.  Increased linear and patchy density at the left lung base.  The cardiac silhouette remains borderline enlarged. The interstitial markings remain mildly prominent.  Bilateral shoulder degenerative changes and thoracic spine degenerative changes.  Bilateral subcoracoid bursa calcified loose bodies.  IMPRESSION:  1.  Resolved pleural fluid on the right with no pneumothorax following thoracentesis. 2.  Decreased right basilar atelectasis. 3.  Increased left basilar atelectasis and possible pneumonia. 4.  Stable mild chronic interstitial lung disease.   Original Report Authenticated By: Beckie Salts, M.D.   US Thoracentesis Asp Pleural Space W/img Guide  09/08/2012   *RADIOLOGY REPORT*  Clinical Data:  Right pleural effusion  ULTRASOUND GUIDED right THORACENTESIS  Comparison:  None  An ultrasound guided thoracentesis was thoroughly discussed with the patient and questions answered.  The benefits, risks, alternatives and complications  were also discussed.  The patient understands and wishes to proceed with the procedure.  Written consent was obtained.  Ultrasound was performed to localize and mark an adequate pocket of fluid in the right chest.  The area was then prepped and draped in the normal sterile fashion.  1% Lidocaine was used for local anesthesia.  Under ultrasound guidance a 19 gauge Yueh catheter was introduced.  Thoracentesis was performed.  The catheter was removed and a dressing applied.  Complications:  None  Findings: A total of approximately 500 ml of yellow fluid was removed. A fluid sample was sent for laboratory analysis.  IMPRESSION: Successful ultrasound guided right thoracentesis yielding 500 ml of pleural fluid.  Read by: Ralene Muskrat, P.A.-C   Original Report Authenticated By: Malachy Moan, M.D.    Scheduled Meds: . amiodarone  200 mg Oral Daily  . aspirin EC  325 mg Oral Daily  . atropine  1 mg Intravenous Once  . carvedilol  3.125 mg Oral BID WC  . cefUROXime  500 mg Oral BID WC  . citalopram  10 mg Oral Daily  . cyanocobalamin  1,000 mcg Subcutaneous Q1500  . furosemide  40 mg Oral Daily  . insulin aspart  0-5 Units Subcutaneous QHS  . insulin aspart  0-9 Units Subcutaneous TID WC  . insulin glargine  18 Units Subcutaneous QHS  . lisinopril  2.5 mg Oral Daily  . magnesium oxide  400 mg Oral BID  . mupirocin ointment  1 application Nasal BID  . potassium chloride  40 mEq Oral Daily  . rOPINIRole  0.5 mg Oral QHS  . simvastatin  20 mg Oral q1800  . Warfarin - Pharmacist Dosing Inpatient   Does not apply q1800   Continuous Infusions:   Principal Problem:   Pleural effusion Active Problems:   Diabetes type 2, uncontrolled   Chronic diastolic heart failure   Hypoxemia   UTI (lower urinary tract infection)   Bradycardia   Chest pressure    Time spent: > 35 mins    [facility]  [facility] Pager [phone]. If 7PM-7AM, please contact night-coverage at [email or web address],  password [facility] 09/10/2012, 11:22 AM  LOS: 5 days

## 2012-09-10 NOTE — Care Management Note (Unsigned)
    Page 1 of 1   09/10/2012     1:22:41 PM   CARE MANAGEMENT NOTE 09/10/2012  Patient:  [name] [name], [name] [name]   Account Number:  [id number]  Date Initiated:  09/08/2012  Documentation initiated by:  Fardowsa Authier  Subjective/Objective Assessment:   PT ADM ON 6/13 WITH SOB, PLEURAL EFFUSION.  PTA, PT LIVES AT HOME WITH SON.     Action/Plan:   PT/OT RECOMMENDING SNF AT DC.  PT HAS BEEN TO [facility] IN THE PAST.  WILL CONSULT CSW TO FACILITATE DC TO SNF WHEN MEDICALLY STABLE.   Anticipated DC Date:  09/10/2012   Anticipated DC Plan:  SKILLED NURSING FACILITY  In-house referral  Clinical Social Worker      DC Planning Services  CM consult      Choice offered to / List presented to:             Status of service:  In process, will continue to follow Medicare Important Message given?   (If response is "NO", the following Medicare IM given date fields will be blank) Date Medicare IM given:   Date Additional Medicare IM given:    Discharge Disposition:    Per UR Regulation:  Reviewed for med. necessity/level of care/duration of stay  If discussed at Long Length of Stay Meetings, dates discussed:    Comments:  09/10/12 Gentry Pilson,RN,BSN [phone] PT WITH BRADYCARDIA TODAY; TRANSFERRED TO CARDIAC ICU.

## 2012-09-10 NOTE — Progress Notes (Signed)
eLink Physician-Brief Progress Note Patient Name: Yissel Habermehl DOB: [birth year] MRN: [id number]  Date of Service  09/10/2012   HPI/Events of Note   Rn concerned output low, has diapers also  eICU Interventions  cvp now Bmet, last crt wnl   Intervention Category Intermediate Interventions: Oliguria - evaluation and management  FEINSTEIN,DANIEL J. 09/10/2012, 8:17 PM

## 2012-09-10 NOTE — Progress Notes (Signed)
Pt transferred to 2906; report called to Windell Moulding, Charity fundraiser; pt's daughter called also to make aware of transfer.

## 2012-09-10 NOTE — Procedures (Signed)
Central Venous Catheter Insertion Procedure Note [name] [name] [id number] 16-Feb-1925  Procedure: Insertion of Central Venous Catheter Indications: Assessment of intravascular volume, Drug and/or fluid administration and Frequent blood sampling  Procedure Details Consent: Risks of procedure as well as the alternatives and risks of each were explained to the (patient/caregiver).  Consent for procedure obtained. Time Out: Verified patient identification, verified procedure, site/side was marked, verified correct patient position, special equipment/implants available, medications/allergies/relevent history reviewed, required imaging and test results available.  Performed  Maximum sterile technique was used including antiseptics, cap, gloves, gown, hand hygiene, mask and sheet. Skin prep: Chlorhexidine; local anesthetic administered A antimicrobial bonded/coated triple lumen catheter was placed in the right internal jugular vein using the Seldinger technique to 16 cm, sutured.   Evaluation Blood flow good Complications: No apparent complications Patient did tolerate procedure well. Chest X-ray ordered to verify placement.  CXR: pending.  Procedure performed under direct supervision of Dr. Sung Amabile and with ultrasound guidance for real time vessel cannulation.      [name] Brim, NP-C [facility] Pgr: [phone] or [phone]    09/10/2012, 2:25 PM  I was present for and supervised the entire procedure  [name] Fischer, MD ; [facility] service Mobile [phone].  After 5:30 PM or weekends, call [phone]

## 2012-09-11 ENCOUNTER — Inpatient Hospital Stay (HOSPITAL_COMMUNITY): Payer: Medicare Other

## 2012-09-11 DIAGNOSIS — N390 Urinary tract infection, site not specified: Secondary | ICD-10-CM

## 2012-09-11 LAB — COMPREHENSIVE METABOLIC PANEL
ALT: 54 U/L — ABNORMAL HIGH (ref 0–35)
Alkaline Phosphatase: 109 U/L (ref 39–117)
BUN: 25 mg/dL — ABNORMAL HIGH (ref 6–23)
CO2: 24 mEq/L (ref 19–32)
Calcium: 8.5 mg/dL (ref 8.4–10.5)
GFR calc Af Amer: 39 mL/min — ABNORMAL LOW (ref >90)
GFR calc non Af Amer: 34 mL/min — ABNORMAL LOW (ref >90)
Glucose, Bld: 101 mg/dL — ABNORMAL HIGH (ref 70–99)
Potassium: 4.8 mEq/L (ref 3.5–5.1)
Sodium: 132 mEq/L — ABNORMAL LOW (ref 135–145)

## 2012-09-11 LAB — GLUCOSE, CAPILLARY
Glucose-Capillary: 107 mg/dL — ABNORMAL HIGH (ref 70–99)
Glucose-Capillary: 99 mg/dL (ref 70–99)
Glucose-Capillary: 153 mg/dL — ABNORMAL HIGH (ref 70–99)

## 2012-09-11 LAB — CULTURE, BLOOD (ROUTINE X 2): Culture: NO GROWTH

## 2012-09-11 LAB — CBC
HCT: 32.4 % — ABNORMAL LOW (ref 36.0–46.0)
Hemoglobin: 10.8 g/dL — ABNORMAL LOW (ref 12.0–15.0)
MCH: 31 pg (ref 26.0–34.0)
MCHC: 33.3 g/dL (ref 30.0–36.0)
RBC: 3.48 MIL/uL — ABNORMAL LOW (ref 3.87–5.11)

## 2012-09-11 LAB — PROTIME-INR: Prothrombin Time: 25.4 seconds — ABNORMAL HIGH (ref 11.6–15.2)

## 2012-09-11 MED ORDER — WARFARIN SODIUM 1 MG PO TABS
1.0000 mg | ORAL_TABLET | Freq: Once | ORAL | Status: AC
  Administered 2012-09-11: 1 mg via ORAL
  Filled 2012-09-11: qty 1

## 2012-09-11 MED ORDER — AMLODIPINE BESYLATE 10 MG PO TABS
10.0000 mg | ORAL_TABLET | Freq: Every day | ORAL | Status: DC
  Administered 2012-09-11 – 2012-09-14 (×4): 10 mg via ORAL
  Filled 2012-09-11 (×4): qty 1

## 2012-09-11 MED ORDER — HYDRALAZINE HCL 20 MG/ML IJ SOLN: 10.0000 mg | INTRAMUSCULAR | Status: DC | PRN

## 2012-09-11 NOTE — Progress Notes (Signed)
Patient transferred to 2900 and I have given report to CSW covering unit 2900- plans are in place for SNF bed at [facility] once stable- updated SNF rep that patient is not ready for transfer today-  Reece Levy, MSW, Theresia Majors [phone]

## 2012-09-11 NOTE — Progress Notes (Signed)
PULMONARY  / CRITICAL CARE MEDICINE  Name: [name] [name] MRN: [id number] DOB: [birth year]    ADMISSION DATE:  09/05/2012 CONSULTATION DATE:  6/18  REFERRING MD :  Janee Morn PRIMARY SERVICE: [facility]--->PCCM  CHIEF COMPLAINT:  Symptomatic bradycardia   BRIEF PATIENT DESCRIPTION:  71 yof admitted 6/14 w/ decompensated distolic dysfxn. Treated w/ diuresis, had right diagnostic/therapeutic thora on 6/16 (findings transudate), was doing well, slated to go home when during getting up to [facility] the am of 6/18 developed symptomatic bradycardia. Moved to ICU, cards called. PCCM asked to assume care,   SIGNIFICANT EVENTS / STUDIES:  ECHO  6/15: The estimated ejection fraction was in the range of 60% to 65%. Wall motion was normal. Pulmonary arteries: Systolic pressure was moderately increased. PA peak pressure: 62mm Hg (S  LINES / TUBES:   CULTURES: UC 6/13: klebsiella (pansens) BC x 2 6/14:>>> UC 6/16: neg  Pleural fluid 6/16>>>ng  ANTIBIOTICS: Rocephin 6/13>>>6/17 Cefuroxime 6/18>>>    SUBJECTIVE:  Feels  better  Denies CP, dyspnea or diziness  VITAL SIGNS: Temp:  [96.6 F (35.9 C)-98.1 F (36.7 C)] 98.1 F (36.7 C) (06/19 0730) Pulse Rate:  [35-52] 52 (06/19 0800) Resp:  [12-24] 21 (06/19 0800) BP: (80-196)/(14-142) 196/50 mmHg (06/19 0800) SpO2:  [91 %-100 %] 100 % (06/19 0800) Weight:  [67.2 kg (148 lb 2.4 oz)-67.9 kg (149 lb 11.1 oz)] 67.2 kg (148 lb 2.4 oz) (06/19 0500) HEMODYNAMICS: CVP:  [15 mmHg] 15 mmHg VENTILATOR SETTINGS:   INTAKE / OUTPUT: Intake/Output     06/18 0701 - 06/19 0700 06/19 0701 - 06/20 0700   P.O. 360    I.V. (mL/kg) 270 (4) 20 (0.3)   IV Piggyback 500    Total Intake(mL/kg) 1130 (16.8) 20 (0.3)   Urine (mL/kg/hr) 450 (0.3) 100 (0.9)   Stool 1 (0)    Total Output 451 100   Net +679 -80        Stool Occurrence 1 x 1 x     PHYSICAL EXAMINATION: General:  eld white female in no acute distress, lying supine.  Neuro:  Awake, alert, no distress no  focal def  HEENT:  [place], no JVD Cardiovascular:  rrr Lungs:  Crackles in bases  Abdomen:  Soft, non-tender + bowel sounds no OM  Musculoskeletal:  Intact  Skin:  Scattered areas of ecchymosis   LABS:  Recent Labs Lab 09/10/12 0540 09/10/12 2050 09/11/12 0500  NA 136 137 132*  K 4.4 5.6* 4.8  CL 103 104 99  CO2 26 25 24   BUN 18 22 25*  CREATININE 0.73 1.16* 1.38*  GLUCOSE 88 116* 101*    Recent Labs Lab 09/07/12 0355 09/09/12 0435 09/11/12 0500  HGB 11.3* 10.6* 10.8*  HCT 34.2* 31.9* 32.4*  WBC 6.2 4.7 6.6  PLT 255 254 249      Recent Labs Lab 09/10/12 0616 09/10/12 1116 09/10/12 1759 09/10/12 2153 09/11/12 0734  GLUCAP 89 135* 136* 107* 94    CXR: RLL vol loss effusion/atx   ASSESSMENT / PLAN:  PULMONARY A: dyspnea: d/t bradycardia      Right pleural effusion/ recurrent. Sample on 6/16 was transudate     Elevated right hemidiaphragm Appears to have some chronicity  P:   Supplement oxygen  oob    CARDIOVASCULAR A:  Symptomatic bradycardia (junctional rhythm) Decompensated diastolic HF H/o AF/Aflutter s/p MAZE Prior PEA arrest  Severe MR s/p MV repair  Cards has seen  P:  Cycle enzymes Hold rate controlling meds - coreg &  amio Off Dopamine gtt Cont coumadin per pharmacy  Further recs per cards  Prefer amlodipin for hypertension  RENAL A:  AKI P:  Decrease lasix to daily  GASTROINTESTINAL A:  No acute issue  P:   Diet as tolerated   HEMATOLOGIC A:   ACD. No evidence of bleeding  P:  Trend INR Trend CBC   INFECTIOUS A:  Klebsiella UTI P:   Last UC neg. So d/c abx   ENDOCRINE A:  No acute issue  P:   Trend CBC   NEUROLOGIC A:  No acute issue  P:   rx as indicated   TODAY'S SUMMARY:  This is a 79 year old female now in ICU after developing symptomatic bradycardia after developing Junctional rhythm. Moved to ICU for further support and dopamine gtt. Will hold rate controlling meds, place dopamine gtt. Was on coreg and  amio   OK to transfer to tele/ SDU & [facility] to resume care  [facility]  Pulmonary and Critical Care Medicine [facility] Pager: [phone]  09/11/2012, 8:41 AM

## 2012-09-11 NOTE — Progress Notes (Signed)
ELECTROPHYSIOLOGY ROUNDING NOTE    Patient Name: [name] [name] Date of Encounter: 09-11-2012    SUBJECTIVE:Patient feels well this morning.  Chest pain resolved.  No shortness of breath or other complaints.    Pt transferred to CCU yesterday after acute onset junctional rhythm associated with chest pain and hypotension.  Dopamine not started because pressure normalized.    Pt with decreased urine output since yesterday and increase in creatinine (0.73 >>>1.38). 1 dose of IV Lasix given last night without significant urine output  TELEMETRY: Reviewed telemetry pt in sinus brady (40's) has largely replaced intermittent junctional rhythm Filed Vitals:   09/11/12 0200 09/11/12 0300 09/11/12 0400 09/11/12 0500  BP: 160/142 196/34 97/80 130/69  Pulse: 46 45 44 45  Temp:   97.4 F (36.3 C)   TempSrc:   Oral   Resp: 22 23 24 22   Height:      Weight:    148 lb 2.4 oz (67.2 kg)  SpO2: 97% 98% 93% 97%   Well developed and nourished in no acute distress HENT normal Neck supple with JVP--transduced decreaseed BS on R some crackles Slow but Regular rate and rhythm, no murmurs or gallops Abd-soft with active BS No Clubbing cyanosis edema Skin-warm and dry A & Oriented  Grossly normal sensory and motor function    Intake/Output Summary (Last 24 hours) at 09/11/12 1610 Last data filed at 09/11/12 0500  Gross per 24 hour  Intake   1330 ml  Output    451 ml  Net    879 ml    LABS: Basic Metabolic Panel:  Recent Labs  96/04/54 2050 09/11/12 0500  NA 137 132*  K 5.6* 4.8  CL 104 99  CO2 25 24  GLUCOSE 116* 101*  BUN 22 25*  CREATININE 1.16* 1.38*  CALCIUM 8.8 8.5   Liver Function Tests:  Recent Labs  09/08/12 1702 09/11/12 0500  AST  --  48*  ALT  --  54*  ALKPHOS  --  109  BILITOT  --  0.6  PROT 6.3 5.9*  ALBUMIN 2.8* 2.9*  CBC:  Recent Labs  09/09/12 0435 09/11/12 0500  WBC 4.7 6.6  HGB 10.6* 10.8*  HCT 31.9* 32.4*  MCV 92.7 93.1  PLT 254 249    INR: 2.44  Radiology/Studies:  Dg Chest 1 View 09/08/2012   *RADIOLOGY REPORT*  Clinical Data: Status post right thoracentesis.  CHEST - 1 VIEW  Comparison: 09/05/2012.  Findings: The previously seen right pleural fluid is no longer demonstrated.  No pneumothorax.  Decreased linear density at the right lung base.  Increased linear and patchy density at the left lung base.  The cardiac silhouette remains borderline enlarged. The interstitial markings remain mildly prominent.  Bilateral shoulder degenerative changes and thoracic spine degenerative changes.  Bilateral subcoracoid bursa calcified loose bodies.  IMPRESSION:  1.  Resolved pleural fluid on the right with no pneumothorax following thoracentesis. 2.  Decreased right basilar atelectasis. 3.  Increased left basilar atelectasis and possible pneumonia. 4.  Stable mild chronic interstitial lung disease.   Original Report Authenticated By: Beckie Salts, M.D.     Principal Problem:   Pleural effusion Active Problems:   Diabetes type 2, uncontrolled   Chronic diastolic heart failure   Hypoxemia   UTI (lower urinary tract infection)   Bradycardia   Chest pressure   Sinus node dysfunction improving,  Continue to hold amio and coreg Acute kidney injury, CVP 12 suspect related to transient hypotension yesterday Will  hold lisinopril today and follow BP  Mayb e amlodipine would be better

## 2012-09-11 NOTE — Progress Notes (Signed)
ANTICOAGULATION CONSULT NOTE - Follow Up Consult  Pharmacy Consult for Coumadin Indication: atrial fibrillation  Allergies  Allergen Reactions  . Codeine Nausea And Vomiting    Patient Measurements: Height: 5\' 4"  (162.6 cm) Weight: 148 lb 2.4 oz (67.2 kg) IBW/kg (Calculated) : 54.7  Vital Signs: Temp: 98.1 F (36.7 C) (06/19 0730) Temp src: Oral (06/19 0730) BP: 196/50 mmHg (06/19 0800) Pulse Rate: 52 (06/19 0800)  Labs:  Recent Labs  09/09/12 0435 09/10/12 0540 09/10/12 2050 09/11/12 0500  HGB 10.6*  --   --  10.8*  HCT 31.9*  --   --  32.4*  PLT 254  --   --  249  LABPROT 24.1* 22.7*  --  25.4*  INR 2.28* 2.10*  --  2.44*  CREATININE 0.81 0.73 1.16* 1.38*    Estimated Creatinine Clearance: 27.6 ml/min (by C-G formula based on Cr of 1.38).  Assessment: 88 yof on chronic coumadin for afib, admitted 09/05/2012  With SOB, weakness sats in 80s, Found with UTI and R pleural effusion.  Pharmacy consulted to dose warfarin.  Events: 09/11/2012 6/18, transferred to 2900 with HR 30s, HR better off amiodarone, and coreg.   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 Meds:  ASA 325mg , lisinopril, Zocor, furo 40 daily, K 40 daily, MagOx 400 bid,   Endocrinology: DM2 , A1c 7.4%, on SSI, Lantus 18/hs, CBGs < 150 Gastrointestinal / Nutrition: heart diet  Neurology: Celexa (1% incidence of bradycardia), Requip (<1% incidence of bradycardia)  Nephrology: sCr 0.81 (stable), CrCl 45,  lytes good.  Pulmonary: Pleural effusion w/ acute hypoxic resp failure, s/p thoracentesis, respiratory status improved, on RA now.  Hematology / Oncology: Hgb slow trend down 10.6, plts stable  PTA Medication Issues: glimeperide/metformin  Best Practices: coumadin  Goal of Therapy:  INR 2-3 Monitor platelets by anticoagulation protocol: Yes   Plan:  - Warfarin 1 mg PO today - Follow up INR in AM  Thank you for allowing pharmacy to be a part of this patients care team.  Lovenia Kim Pharm.D., BCPS Clinical Pharmacist 09/11/2012 10:44 AM Pager: [phone] Phone: [phone]

## 2012-09-11 NOTE — Progress Notes (Signed)
Physical Therapy Treatment Patient Details Name: [name] [name] MRN: [id number] DOB: 06-17-1924 Today's Date: 09/11/2012 Time: 3086-5784 PT Time Calculation (min): 28 min  PT Assessment / Plan / Recommendation Comments on Treatment Session  Pt s/p UTI, hypoxia, right pleural effusion and new onset yesterday of bradycardia in which pt is getting worked up for pacemaker.    Recent MVR.  Pt states that her vertigo symptoms have resolved since previous treatment performed for right BPPV.  Desat to <90% on RA therefore O2 replaced at 2L.  Will need SNF secondary to balance and endurance deficits.      Follow Up Recommendations  SNF;Supervision for mobility/OOB                 Equipment Recommendations  None recommended by PT    Recommendations for Other Services OT consult  Frequency Min 3X/week   Plan Discharge plan remains appropriate;Frequency remains appropriate    Precautions / Restrictions Precautions Precautions: Fall Restrictions Weight Bearing Restrictions: No   Pertinent Vitals/Pain Desat on RA to <90%, O2 at 2L 100%.  Other VSS, No pain    Mobility  Bed Mobility Bed Mobility: Not assessed Transfers Transfers: Sit to Stand;Stand to Sit Sit to Stand: 4: Min assist;With upper extremity assist;From chair/3-in-1;With armrests Stand to Sit: 4: Min assist;With upper extremity assist;To chair/3-in-1 Details for Transfer Assistance: cueing for anterior translation with assist to complete and cueing for hand placement and control of descent.  Pt soaked in urine on arrival and said she was saving her last Depends for tonight.  Assisted pt to 3N1 and changed pts gown, linens and took off dirty Depends. Cleaned pt with soap and water and pt voided in 3N1.  Cleaned pt after she finished and pt got back into recliner dry and clean.  Did not ambulate pt as her BP went up to 187/43 when she got to 3N1.   Ambulation/Gait Ambulation/Gait Assistance: Not tested (comment) Stairs:  No Wheelchair Mobility Wheelchair Mobility: No     PT Goals Acute Rehab PT Goals Pt will go Supine/Side to Sit: with modified independence;with HOB 0 degrees PT Goal: Supine/Side to Sit - Progress: Progressing toward goal Pt will go Sit to Stand: with modified independence PT Goal: Sit to Stand - Progress: Progressing toward goal Pt will go Stand to Sit: with modified independence PT Goal: Stand to Sit - Progress: Progressing toward goal Pt will Ambulate: >150 feet;with modified independence PT Goal: Ambulate - Progress: Progressing toward goal  Visit Information  Last PT Received On: 09/11/12 Assistance Needed: +2    Subjective Data  Subjective: "I dont know what happened to me yesterday."   Cognition  Cognition Arousal/Alertness: Awake/alert Behavior During Therapy: WFL for tasks assessed/performed Overall Cognitive Status: Within Functional Limits for tasks assessed    Balance  Static Standing Balance Static Standing - Balance Support: Bilateral upper extremity supported;During functional activity Static Standing - Level of Assistance: 4: Min assist Static Standing - Comment/# of Minutes: 3  End of Session PT - End of Session Equipment Utilized During Treatment: Gait belt;Oxygen Activity Tolerance: Patient tolerated treatment well Patient left: in chair;with call bell/phone within reach Nurse Communication: Mobility status       INGOLD,Vidal Lampkins 09/11/2012, 3:35 PM [facility] Acute Rehabilitation [phone] (pager)

## 2012-09-12 ENCOUNTER — Inpatient Hospital Stay (HOSPITAL_COMMUNITY): Payer: Medicare Other

## 2012-09-12 DIAGNOSIS — I5033 Acute on chronic diastolic (congestive) heart failure: Secondary | ICD-10-CM

## 2012-09-12 LAB — BASIC METABOLIC PANEL
GFR calc Af Amer: 43 mL/min — ABNORMAL LOW (ref >90)
GFR calc non Af Amer: 37 mL/min — ABNORMAL LOW (ref >90)
Potassium: 3.7 mEq/L (ref 3.5–5.1)
Sodium: 137 mEq/L (ref 135–145)

## 2012-09-12 LAB — BODY FLUID CULTURE
Culture: NO GROWTH
Gram Stain: NONE SEEN
Special Requests: 60

## 2012-09-12 LAB — PROTIME-INR
INR: 2.55 — ABNORMAL HIGH (ref 0.00–1.49)
Prothrombin Time: 26.2 seconds — ABNORMAL HIGH (ref 11.6–15.2)

## 2012-09-12 LAB — GLUCOSE, CAPILLARY
Glucose-Capillary: 99 mg/dL (ref 70–99)
Glucose-Capillary: 104 mg/dL — ABNORMAL HIGH (ref 70–99)

## 2012-09-12 MED ORDER — WARFARIN SODIUM 1 MG PO TABS
1.0000 mg | ORAL_TABLET | Freq: Once | ORAL | Status: AC
  Administered 2012-09-12: 1 mg via ORAL
  Filled 2012-09-12: qty 1

## 2012-09-12 MED ORDER — FUROSEMIDE 40 MG PO TABS
40.0000 mg | ORAL_TABLET | Freq: Every day | ORAL | Status: DC
  Administered 2012-09-13 – 2012-09-14 (×2): 40 mg via ORAL
  Filled 2012-09-12 (×2): qty 1

## 2012-09-12 MED ORDER — FUROSEMIDE 10 MG/ML IJ SOLN: 40.0000 mg | Freq: Two times a day (BID) | INTRAMUSCULAR | Status: DC

## 2012-09-12 NOTE — Clinical Social Work Note (Signed)
Clinical Social Worker spoke with [name], admissions coordinator for [facility]	[place] and they will be able to receive patient over the weekend if medically stable. CSW will continue to follow.   Rozetta Nunnery MSW, [employer] [phone]

## 2012-09-12 NOTE — Progress Notes (Signed)
ANTICOAGULATION CONSULT NOTE - Follow Up Consult  Pharmacy Consult for Coumadin Indication: atrial fibrillation  Allergies  Allergen Reactions  . Codeine Nausea And Vomiting    Patient Measurements: Height: 5\' 4"  (162.6 cm) Weight: 147 lb 7.8 oz (66.9 kg) IBW/kg (Calculated) : 54.7  Vital Signs: Temp: 98.8 F (37.1 C) (06/20 0745) Temp src: Oral (06/20 0745) BP: 148/41 mmHg (06/20 0745) Pulse Rate: 79 (06/20 0745)  Labs:  Recent Labs  09/10/12 0540 09/10/12 2050 09/11/12 0500 09/12/12 0530  HGB  --   --  10.8*  --   HCT  --   --  32.4*  --   PLT  --   --  249  --   LABPROT 22.7*  --  25.4* 26.2*  INR 2.10*  --  2.44* 2.55*  CREATININE 0.73 1.16* 1.38* 1.28*    Estimated Creatinine Clearance: 29.7 ml/min (by C-G formula based on Cr of 1.28).  Assessment: 88 yof on chronic coumadin for afib, admitted 09/05/2012  With SOB, weakness sats in 80s, Found with UTI and R pleural effusion.  Pharmacy consulted to dose warfarin.  Events: 09/12/2012 6/18, transferred to 2900 with HR 30s, HR better off amiodarone, and coreg.   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 Meds:  ASA 81mg , lisinopril, Zocor, furo 40 daily, K 40 daily, MagOx 400 bid,   Endocrinology: DM2 , A1c 7.4%, on SSI, Lantus 18/hs, CBGs < 200 Gastrointestinal / Nutrition: heart diet  Neurology: Celexa (1% incidence of bradycardia), Requip (<1% incidence of bradycardia)  Nephrology: sCr 1.28 (trend  up), CrCl 29, lytes good.  Pulmonary: Pleural effusion w/ acute hypoxic resp failure, s/p thoracentesis, respiratory status improved, on RA now.  Hematology / Oncology: Hgb slow trend down 10.6, plts stable  PTA Medication Issues: glimeperide/metformin  Best Practices: coumadin  Goal of Therapy:  INR 2-3 Monitor platelets by anticoagulation protocol: Yes   Plan:  - Warfarin 1 mg PO today - Follow up INR in AM  Thank you for allowing pharmacy to be a part of this patients care team.  Lovenia Kim Pharm.D., BCPS Clinical Pharmacist 09/12/2012 9:01 AM Pager: [phone] Phone: [phone]

## 2012-09-12 NOTE — Progress Notes (Addendum)
ELECTROPHYSIOLOGY ROUNDING NOTE    Patient Name: [name] [name] Date of Encounter: 09-11-2012    SUBJECTIVE:Patient feels well this morning.  Chest pain resolved.  No shortness of breath or other complaints.  anixioyus about going home  Renal function improving.  Pt ambulated with PT yesterday  TELEMETRY: Reviewed telemetry pt in sinus rhythm in the 80's  Filed Vitals:   09/12/12 0500 09/12/12 0600 09/12/12 0739 09/12/12 0745  BP:  164/67 149/41 148/41  Pulse:  78 80 79  Temp:    98.8 F (37.1 C)  TempSrc:    Oral  Resp:  17 20 17   Height:      Weight: 147 lb 7.8 oz (66.9 kg)     SpO2:  98% 89% 95%   CURRENT MEDICATIONS: . amLODipine  10 mg Oral Daily  . aspirin EC  81 mg Oral Daily  . citalopram  10 mg Oral Daily  . furosemide  40 mg Oral Daily  . insulin aspart  0-5 Units Subcutaneous QHS  . insulin aspart  0-9 Units Subcutaneous TID WC  . magnesium oxide  400 mg Oral BID  . rOPINIRole  0.5 mg Oral QHS  . simvastatin  20 mg Oral q1800  . Warfarin - Pharmacist Dosing Inpatient   Does not apply q1800   Well developed and nourished in no acute distress HENT normal Neck supple with JVP-CVP in place Clear  Decreased BS R lung Regular rate and rhythm, no murmurs or gallops Abd-soft with active BS No Clubbing cyanosis tr edema Skin-warm and dry A & Oriented  Grossly normal sensory and motor function    Intake/Output Summary (Last 24 hours) at 09/12/12 0804 Last data filed at 09/12/12 0600  Gross per 24 hour  Intake    940 ml  Output   2450 ml  Net  -1510 ml    LABS: Basic Metabolic Panel:  Recent Labs  16/10/96 0500 09/12/12 0530  NA 132* 137  K 4.8 3.7  CL 99 100  CO2 24 26  GLUCOSE 101* 92  BUN 25* 24*  CREATININE 1.38* 1.28*  CALCIUM 8.5 8.6   Liver Function Tests:  Recent Labs  09/11/12 0500  AST 48*  ALT 54*  ALKPHOS 109  BILITOT 0.6  PROT 5.9*  ALBUMIN 2.9*  CBC:  Recent Labs  09/11/12 0500  WBC 6.6  HGB 10.8*  HCT 32.4*    MCV 93.1  PLT 249   INR: 2.55  Radiology/Studies:  Dg Chest 1 View 09/08/2012   *RADIOLOGY REPORT*  Clinical Data: Status post right thoracentesis.  CHEST - 1 VIEW  Comparison: 09/05/2012.  Findings: The previously seen right pleural fluid is no longer demonstrated.  No pneumothorax.  Decreased linear density at the right lung base.  Increased linear and patchy density at the left lung base.  The cardiac silhouette remains borderline enlarged. The interstitial markings remain mildly prominent.  Bilateral shoulder degenerative changes and thoracic spine degenerative changes.  Bilateral subcoracoid bursa calcified loose bodies.  IMPRESSION:  1.  Resolved pleural fluid on the right with no pneumothorax following thoracentesis. 2.  Decreased right basilar atelectasis. 3.  Increased left basilar atelectasis and possible pneumonia. 4.  Stable mild chronic interstitial lung disease.   Original Report Authenticated By: Beckie Salts, M.D.   Principal Problem:   Pleural effusion Active Problems:   Diabetes type 2, uncontrolled   Chronic diastolic heart failure   Hypoxemia   UTI (lower urinary tract infection)   Bradycardia   Chest  pressure  Sinus node dysfunction resolved off amio and coreg  Follow off drug Diuresis today after measure CVP>>4-5 ocntiue po lasix Repeat CXR ? reaccumulation of R pleural fluid bmet in am Mobilize but still pretty weak

## 2012-09-12 NOTE — Progress Notes (Signed)
[facility] Progress Note [place] TEAM 1 - Stepdown/ICU TEAM   Joyceann Kruser HYQ:[id number] DOB: Jul 22, 1924 DOA: 09/05/2012 PCP: Marylen Ponto, MD  Brief narrative: This is an 79 year old female who was admitted on 6/14 to the hospital for decompensated diastolic heart failure. She underwent a right-sided thoracentesis (500 cc) for pleural effusion that was found to be transudative. She was getting prepared for discharge on 6/18 when she was noted to develop symptomatic bradycardia. At this point she was transferred to the ICU and an ETT consult was requested. Her amiodarone and beta blocker were discontinued. She was given atropine without any response. EKG revealed that she was in junctional rhythm.   Assessment/Plan: Principal Problem:   Pleural effusion/exacerbation of chronic diastolic heart failure/hypoxemia -Will need more aggressive diuresis today-cardiology is managing -Repeat chest x-ray to ensure that she is not reaccumulating pleural fluid -Oxygen level today is only 91% on room air  Active Problems:      Bradycardia/junctional rhythm -Corrected once rate controlling agents were discontinued -Currently sinus rhythm  A flutter status post DC cardioversion -As mentioned above, amiodarone is on hold  Mitral valve repair 2/25  Diabetes type 2, uncontrolled    UTI-Klebsiella - Received 4 days of Rocephin for this    Code Status: full code  Family Communication: none  Disposition Plan: continue to follow in step down for heart failure   Consultants: Cardiology  Procedures: Thoracentesis   Antibiotics: Rocephin 6/13>>>6/17  Cefuroxime 6/18>>>   DVT prophylaxis:  warfarin   HPI/Subjective:  Patient sitting up in a chair-complains of feeling quite fatigued however no complaint of shortness of breath or chest pressure.    Objective: Blood pressure 160/51, pulse 76, temperature 98.7 F (37.1 C), temperature source Oral, resp. rate 22, height 5\' 4"   (1.626 m), weight 66.9 kg (147 lb 7.8 oz), SpO2 93.00%.  Intake/Output Summary (Last 24 hours) at 09/12/12 1326 Last data filed at 09/12/12 1300  Gross per 24 hour  Intake    480 ml  Output   1900 ml  Net  -1420 ml     Exam: General: No acute respiratory distress Lungs:  crackles in the left lower lobe with decreased breath sounds in right lower lobe  Cardiovascular: Regular rate and rhythm without murmur gallop or rub normal S1 and S2 Abdomen: Nontender, nondistended, soft, bowel sounds positive, no rebound, no ascites, no appreciable mass Extremities: No significant cyanosis, clubbing, or edema bilateral lower extremities  Data Reviewed: Basic Metabolic Panel:  Recent Labs Lab 09/09/12 0435 09/10/12 0540 09/10/12 2050 09/11/12 0500 09/12/12 0530  NA 139 136 137 132* 137  K 4.0 4.4 5.6* 4.8 3.7  CL 104 103 104 99 100  CO2 28 26 25 24 26   GLUCOSE 77 88 116* 101* 92  BUN 19 18 22  25* 24*  CREATININE 0.81 0.73 1.16* 1.38* 1.28*  CALCIUM 8.6 8.8 8.8 8.5 8.6   Liver Function Tests:  Recent Labs Lab 09/05/12 2021 09/08/12 1702 09/11/12 0500  AST 29  --  48*  ALT 21  --  54*  ALKPHOS 91  --  109  BILITOT 0.8  --  0.6  PROT 6.4 6.3 5.9*  ALBUMIN 3.2* 2.8* 2.9*   No results found for this basename: LIPASE, AMYLASE,  in the last 168 hours No results found for this basename: AMMONIA,  in the last 168 hours CBC:  Recent Labs Lab 09/05/12 2021 09/06/12 0550 09/07/12 0355 09/09/12 0435 09/11/12 0500  WBC 6.7 6.2 6.2 4.7 6.6  NEUTROABS 5.1  --   --   --   --   HGB 12.3 11.9* 11.3* 10.6* 10.8*  HCT 36.1 35.7* 34.2* 31.9* 32.4*  MCV 91.9 92.5 92.2 92.7 93.1  PLT 256 250 255 254 249   Cardiac Enzymes: No results found for this basename: CKTOTAL, CKMB, CKMBINDEX, TROPONINI,  in the last 168 hours BNP (last 3 results)  Recent Labs  05/06/12 0229 05/13/12 0535 09/06/12 0302  PROBNP 3044.0* 1125.0* 3823.0*   CBG:  Recent Labs Lab 09/11/12 1226  09/11/12 1654 09/11/12 2123 09/12/12 0750 09/12/12 1202  GLUCAP 99 94 153* 99 281*    Recent Results (from the past 240 hour(s))  URINE CULTURE     Status: None   Collection Time    09/05/12 10:22 PM      Result Value Range Status   Specimen Description URINE, RANDOM   Final   Special Requests NONE   Final   Culture  Setup Time 09/06/2012 16:58   Final   Colony Count >=100,000 COLONIES/ML   Final   Culture KLEBSIELLA PNEUMONIAE   Final   Report Status 09/08/2012 FINAL   Final   Organism ID, Bacteria KLEBSIELLA PNEUMONIAE   Final  MRSA PCR SCREENING     Status: Abnormal   Collection Time    09/06/12  4:52 AM      Result Value Range Status   MRSA by PCR POSITIVE (*) NEGATIVE Final   Comment:            The GeneXpert MRSA Assay (FDA     approved for NASAL specimens     only), is one component of a     comprehensive MRSA colonization     surveillance program. It is not     intended to diagnose MRSA     infection nor to guide or     monitor treatment for     MRSA infections.     RESULT CALLED TO, READ BACK BY AND VERIFIED WITH:     LINDSAY J.,RN 09/06/12 0818 BY JONESJ  CULTURE, BLOOD (ROUTINE X 2)     Status: None   Collection Time    09/06/12  5:50 AM      Result Value Range Status   Specimen Description BLOOD LEFT ARM   Final   Special Requests BOTTLES DRAWN AEROBIC AND ANAEROBIC 10CC EACH   Final   Culture  Setup Time 09/06/2012 13:30   Final   Culture NO GROWTH 5 DAYS   Final   Report Status 09/11/2012 FINAL   Final  CULTURE, BLOOD (ROUTINE X 2)     Status: None   Collection Time    09/06/12  5:55 AM      Result Value Range Status   Specimen Description BLOOD LEFT ARM   Final   Special Requests BOTTLES DRAWN AEROBIC AND ANAEROBIC 10CC EACH   Final   Culture  Setup Time 09/06/2012 13:30   Final   Culture NO GROWTH 5 DAYS   Final   Report Status 09/11/2012 FINAL   Final  BODY FLUID CULTURE     Status: None   Collection Time    09/08/12  3:01 PM      Result  Value Range Status   Specimen Description PLEURAL FLUID RIGHT   Final   Special Requests 60 ML FLUID   Final   Gram Stain     Final   Value: NO WBC SEEN     NO ORGANISMS SEEN  Culture NO GROWTH 3 DAYS   Final   Report Status 09/12/2012 FINAL   Final  URINE CULTURE     Status: None   Collection Time    09/08/12  5:04 PM      Result Value Range Status   Specimen Description URINE, CLEAN CATCH   Final   Special Requests Normal   Final   Culture  Setup Time 09/08/2012 18:45   Final   Colony Count NO GROWTH   Final   Culture NO GROWTH   Final   Report Status 09/09/2012 FINAL   Final     Studies:  Recent x-ray studies have been reviewed in detail by the Attending Physician  Scheduled Meds:  Scheduled Meds: . amLODipine  10 mg Oral Daily  . aspirin EC  81 mg Oral Daily  . citalopram  10 mg Oral Daily  . furosemide  40 mg Oral Daily  . insulin aspart  0-5 Units Subcutaneous QHS  . insulin aspart  0-9 Units Subcutaneous TID WC  . magnesium oxide  400 mg Oral BID  . rOPINIRole  0.5 mg Oral QHS  . simvastatin  20 mg Oral q1800  . warfarin  1 mg Oral ONCE-1800  . Warfarin - Pharmacist Dosing Inpatient   Does not apply q1800   Continuous Infusions: . sodium chloride 10 mL/hr at 09/12/12 0000    Time spent on care of this patient: 30 minutes    Calvert Cantor, MD [phone]   [facility] Office  [phone] Pager - Text Page per Amion as per below:  On-Call/Text Page:      [email or web address]      password [email or web address]  If 7PM-7AM, please contact night-coverage [email or web address] Password [email or web address] 09/12/2012, 1:26 PM   LOS: 7 days

## 2012-09-13 ENCOUNTER — Inpatient Hospital Stay (HOSPITAL_COMMUNITY): Payer: Medicare Other

## 2012-09-13 LAB — BASIC METABOLIC PANEL
BUN: 20 mg/dL (ref 6–23)
CO2: 30 mEq/L (ref 19–32)
Chloride: 100 mEq/L (ref 96–112)
Creatinine, Ser: 1.13 mg/dL — ABNORMAL HIGH (ref 0.50–1.10)

## 2012-09-13 LAB — GLUCOSE, CAPILLARY: Glucose-Capillary: 181 mg/dL — ABNORMAL HIGH (ref 70–99)

## 2012-09-13 MED ORDER — POTASSIUM CHLORIDE CRYS ER 20 MEQ PO TBCR
40.0000 meq | EXTENDED_RELEASE_TABLET | Freq: Once | ORAL | Status: AC
  Administered 2012-09-13: 40 meq via ORAL
  Filled 2012-09-13: qty 2

## 2012-09-13 MED ORDER — WARFARIN SODIUM 1 MG PO TABS
1.0000 mg | ORAL_TABLET | Freq: Once | ORAL | Status: AC
  Administered 2012-09-13: 1 mg via ORAL
  Filled 2012-09-13: qty 1

## 2012-09-13 NOTE — Progress Notes (Signed)
ANTICOAGULATION CONSULT NOTE - Follow Up Consult  Pharmacy Consult for Coumadin Indication: atrial fibrillation  Allergies  Allergen Reactions  . Codeine Nausea And Vomiting    Patient Measurements: Height: 5\' 4"  (162.6 cm) Weight: 147 lb 7.8 oz (66.9 kg) IBW/kg (Calculated) : 54.7  Vital Signs: Temp: 99 F (37.2 C) (06/21 1247) Temp src: Oral (06/21 1247) BP: 143/38 mmHg (06/21 0830) Pulse Rate: 79 (06/21 1247)  Labs:  Recent Labs  09/11/12 0500 09/12/12 0530 09/13/12 0450  HGB 10.8*  --   --   HCT 32.4*  --   --   PLT 249  --   --   LABPROT 25.4* 26.2* 25.5*  INR 2.44* 2.55* 2.46*  CREATININE 1.38* 1.28* 1.13*    Estimated Creatinine Clearance: 33.6 ml/min (by C-G formula based on Cr of 1.13).  Assessment: 88 yof on chronic coumadin for afib, admitted 09/05/2012  With SOB, weakness sats in 80s, Found with UTI and R pleural effusion.  Pharmacy consulted to dose warfarin.  Home dose 2mg  daily except 1mg  on Sat/Sun  INR therapeutic today.  No bleeding or complications noted.   Goal of Therapy:  INR 2-3 Monitor platelets by anticoagulation protocol: Yes   Plan:  - Warfarin 1 mg PO today - Follow up INR in AM  Thank you for allowing pharmacy to be a part of this patients care team.  Tad Moore, BCPS  Clinical Pharmacist Pager [phone]  09/13/2012 1:59 PM

## 2012-09-13 NOTE — Progress Notes (Signed)
[facility] Progress Note [place] TEAM 1 - Stepdown/ICU TEAM   [name] [name] ZOX:[id number] DOB: [birth year] DOA: 09/05/2012 PCP: Marylen Ponto, MD  Brief narrative: This is an 79 year old female who was admitted on 6/14 to the hospital for decompensated diastolic heart failure. She underwent a right-sided thoracentesis (500 cc) for pleural effusion that was found to be transudative. She was getting prepared for discharge on 6/18 when she was noted to develop symptomatic bradycardia. At this point she was transferred to the ICU and an ETT consult was requested. Her amiodarone and beta blocker were discontinued. She was given atropine without any response. EKG revealed that she was in junctional rhythm.   Assessment/Plan: Principal Problem:   Pleural effusion/exacerbation of chronic diastolic heart failure/hypoxemia -well diuresed at this point -Repeat chest x-ray reveals a small effusion persists  Active Problems:      Bradycardia/junctional rhythm -Corrected once rate controlling agents were discontinued -Currently sinus rhythm - cont to follow on tele- Cardio not resuming any medications yet  A flutter status post DC cardioversion -As mentioned above, amiodarone is on hold  Mitral valve repair 2/25  Diabetes type 2, uncontrolled    UTI-Klebsiella - Received 4 days of Rocephin for this    Code Status: full code  Family Communication: none  Disposition Plan: transfer to tele- has bed at nursing facility   Consultants: Cardiology  Procedures: Thoracentesis   Antibiotics: Rocephin 6/13>>>6/17  Cefuroxime 6/18>>>   DVT prophylaxis:  warfarin   HPI/Subjective:  Patient laying in bed- feels stronger than yesterday- no new complaints.   Objective: Blood pressure 147/46, pulse 72, temperature 98 F (36.7 C), temperature source Oral, resp. rate 23, height 5\' 4"  (1.626 m), weight 66.9 kg (147 lb 7.8 oz), SpO2 92.00%.  Intake/Output Summary (Last 24 hours) at  09/13/12 1748 Last data filed at 09/13/12 1300  Gross per 24 hour  Intake    720 ml  Output      0 ml  Net    720 ml     Exam: General: No acute respiratory distress Lungs:  CTA b/l   Cardiovascular: Regular rate and rhythm without murmur gallop or rub normal S1 and S2 Abdomen: Nontender, nondistended, soft, bowel sounds positive, no rebound, no ascites, no appreciable mass Extremities: No significant cyanosis, clubbing, or edema bilateral lower extremities  Data Reviewed: Basic Metabolic Panel:  Recent Labs Lab 09/10/12 0540 09/10/12 2050 09/11/12 0500 09/12/12 0530 09/13/12 0450  NA 136 137 132* 137 136  K 4.4 5.6* 4.8 3.7 3.3*  CL 103 104 99 100 100  CO2 26 25 24 26 30   GLUCOSE 88 116* 101* 92 129*  BUN 18 22 25* 24* 20  CREATININE 0.73 1.16* 1.38* 1.28* 1.13*  CALCIUM 8.8 8.8 8.5 8.6 8.7   Liver Function Tests:  Recent Labs Lab 09/08/12 1702 09/11/12 0500  AST  --  48*  ALT  --  54*  ALKPHOS  --  109  BILITOT  --  0.6  PROT 6.3 5.9*  ALBUMIN 2.8* 2.9*   No results found for this basename: LIPASE, AMYLASE,  in the last 168 hours No results found for this basename: AMMONIA,  in the last 168 hours CBC:  Recent Labs Lab 09/07/12 0355 09/09/12 0435 09/11/12 0500  WBC 6.2 4.7 6.6  HGB 11.3* 10.6* 10.8*  HCT 34.2* 31.9* 32.4*  MCV 92.2 92.7 93.1  PLT 255 254 249   Cardiac Enzymes: No results found for this basename: CKTOTAL, CKMB, CKMBINDEX, TROPONINI,  in  the last 168 hours BNP (last 3 results)  Recent Labs  05/06/12 0229 05/13/12 0535 09/06/12 0302  PROBNP 3044.0* 1125.0* 3823.0*   CBG:  Recent Labs Lab 09/12/12 1645 09/12/12 2152 09/13/12 0830 09/13/12 1241 09/13/12 1703  GLUCAP 151* 104* 108* 155* 105*    Recent Results (from the past 240 hour(s))  URINE CULTURE     Status: None   Collection Time    09/05/12 10:22 PM      Result Value Range Status   Specimen Description URINE, RANDOM   Final   Special Requests NONE   Final    Culture  Setup Time 09/06/2012 16:58   Final   Colony Count >=100,000 COLONIES/ML   Final   Culture KLEBSIELLA PNEUMONIAE   Final   Report Status 09/08/2012 FINAL   Final   Organism ID, Bacteria KLEBSIELLA PNEUMONIAE   Final  MRSA PCR SCREENING     Status: Abnormal   Collection Time    09/06/12  4:52 AM      Result Value Range Status   MRSA by PCR POSITIVE (*) NEGATIVE Final   Comment:            The GeneXpert MRSA Assay (FDA     approved for NASAL specimens     only), is one component of a     comprehensive MRSA colonization     surveillance program. It is not     intended to diagnose MRSA     infection nor to guide or     monitor treatment for     MRSA infections.     RESULT CALLED TO, READ BACK BY AND VERIFIED WITH:     LINDSAY J.,RN 09/06/12 0818 BY JONESJ  CULTURE, BLOOD (ROUTINE X 2)     Status: None   Collection Time    09/06/12  5:50 AM      Result Value Range Status   Specimen Description BLOOD LEFT ARM   Final   Special Requests BOTTLES DRAWN AEROBIC AND ANAEROBIC 10CC EACH   Final   Culture  Setup Time 09/06/2012 13:30   Final   Culture NO GROWTH 5 DAYS   Final   Report Status 09/11/2012 FINAL   Final  CULTURE, BLOOD (ROUTINE X 2)     Status: None   Collection Time    09/06/12  5:55 AM      Result Value Range Status   Specimen Description BLOOD LEFT ARM   Final   Special Requests BOTTLES DRAWN AEROBIC AND ANAEROBIC 10CC EACH   Final   Culture  Setup Time 09/06/2012 13:30   Final   Culture NO GROWTH 5 DAYS   Final   Report Status 09/11/2012 FINAL   Final  BODY FLUID CULTURE     Status: None   Collection Time    09/08/12  3:01 PM      Result Value Range Status   Specimen Description PLEURAL FLUID RIGHT   Final   Special Requests 60 ML FLUID   Final   Gram Stain     Final   Value: NO WBC SEEN     NO ORGANISMS SEEN   Culture NO GROWTH 3 DAYS   Final   Report Status 09/12/2012 FINAL   Final  URINE CULTURE     Status: None   Collection Time    09/08/12   5:04 PM      Result Value Range Status   Specimen Description URINE, CLEAN CATCH   Final  Special Requests Normal   Final   Culture  Setup Time 09/08/2012 18:45   Final   Colony Count NO GROWTH   Final   Culture NO GROWTH   Final   Report Status 09/09/2012 FINAL   Final     Studies:  Recent x-ray studies have been reviewed in detail by the Attending Physician  Scheduled Meds:  Scheduled Meds: . amLODipine  10 mg Oral Daily  . aspirin EC  81 mg Oral Daily  . citalopram  10 mg Oral Daily  . furosemide  40 mg Oral Daily  . insulin aspart  0-5 Units Subcutaneous QHS  . insulin aspart  0-9 Units Subcutaneous TID WC  . magnesium oxide  400 mg Oral BID  . rOPINIRole  0.5 mg Oral QHS  . simvastatin  20 mg Oral q1800  . warfarin  1 mg Oral ONCE-1800  . Warfarin - Pharmacist Dosing Inpatient   Does not apply q1800   Continuous Infusions: . sodium chloride Stopped (09/12/12 0600)    Time spent on care of this patient: 30 minutes    Calvert Cantor, MD [phone]   [facility] Office  [phone] Pager - Text Page per Amion as per below:  On-Call/Text Page:      [email or web address]      password [email or web address]  If 7PM-7AM, please contact night-coverage [email or web address] Password [email or web address] 09/13/2012, 5:48 PM   LOS: 8 days

## 2012-09-13 NOTE — Progress Notes (Signed)
SUBJECTIVE:  No complaints  OBJECTIVE:   Vitals:   Filed Vitals:   09/13/12 0000 09/13/12 0300 09/13/12 0400 09/13/12 0830  BP: 149/48  160/69 143/38  Pulse: 72  73 75  Temp: 98.4 F (36.9 C) 98 F (36.7 C)  98.5 F (36.9 C)  TempSrc: Oral Oral  Oral  Resp: 20  18 18   Height:      Weight:      SpO2: 98%  93% 94%   I&O's:   Intake/Output Summary (Last 24 hours) at 09/13/12 1137 Last data filed at 09/13/12 0900  Gross per 24 hour  Intake    480 ml  Output    200 ml  Net    280 ml   TELEMETRY: Reviewed telemetry pt in NSR:     PHYSICAL EXAM General: Well developed, well nourished, in no acute distress Head: Eyes PERRLA, No xanthomas.   Normal cephalic and atramatic  Lungs:   Clear bilaterally to auscultation and percussion. Slightly decreased BS at right base Heart:   HRRR S1 S2 Pulses are 2+ & equal. Abdomen: Bowel sounds are positive, abdomen soft and non-tender without masses  Extremities:   No clubbing, cyanosis or edema.  DP +1 Neuro: Alert and oriented X 3. Psych:  Good affect, responds appropriately   LABS: Basic Metabolic Panel:  Recent Labs  16/10/96 0530 09/13/12 0450  NA 137 136  K 3.7 3.3*  CL 100 100  CO2 26 30  GLUCOSE 92 129*  BUN 24* 20  CREATININE 1.28* 1.13*  CALCIUM 8.6 8.7   Liver Function Tests:  Recent Labs  09/11/12 0500  AST 48*  ALT 54*  ALKPHOS 109  BILITOT 0.6  PROT 5.9*  ALBUMIN 2.9*   No results found for this basename: LIPASE, AMYLASE,  in the last 72 hours CBC:  Recent Labs  09/11/12 0500  WBC 6.6  HGB 10.8*  HCT 32.4*  MCV 93.1  PLT 249   Coag Panel:   Lab Results  Component Value Date   INR 2.46* 09/13/2012   INR 2.55* 09/12/2012   INR 2.44* 09/11/2012    RADIOLOGY: Dg Chest 1 View  09/08/2012   *RADIOLOGY REPORT*  Clinical Data: Status post right thoracentesis.  CHEST - 1 VIEW  Comparison: 09/05/2012.  Findings: The previously seen right pleural fluid is no longer demonstrated.  No pneumothorax.   Decreased linear density at the right lung base.  Increased linear and patchy density at the left lung base.  The cardiac silhouette remains borderline enlarged. The interstitial markings remain mildly prominent.  Bilateral shoulder degenerative changes and thoracic spine degenerative changes.  Bilateral subcoracoid bursa calcified loose bodies.  IMPRESSION:  1.  Resolved pleural fluid on the right with no pneumothorax following thoracentesis. 2.  Decreased right basilar atelectasis. 3.  Increased left basilar atelectasis and possible pneumonia. 4.  Stable mild chronic interstitial lung disease.   Original Report Authenticated By: Beckie Salts, M.D.   Dg Chest 2 View  09/12/2012   *RADIOLOGY REPORT*  Clinical Data: Right pleural effusion. Cough.  CHEST - 2 VIEW  Comparison: Chest x-rays dated 09/11/2012 and 09/05/2012  Findings:  Central venous catheter tip is in good position in the superior vena cava.  Heart size and vascularity are normal.  Slight decrease in the right base atelectasis and effusion.  There is also a tiny left effusion.  No pneumothorax.  No acute osseous abnormality.  IMPRESSION: Slight decrease in right base atelectasis and effusion.  Small left effusion.  Original Report Authenticated By: Francene Boyers, M.D.   Dg Chest 2 View  09/05/2012   *RADIOLOGY REPORT*  Clinical Data: fatigue  CHEST - 2 VIEW  Comparison: 09/01/2012  Findings: Borderline cardiomegaly.  Small to moderate right pleural effusion with right lower lobe atelectasis or infiltrate.  Small left pleural effusion with left basilar atelectasis.  No pulmonary edema.  IMPRESSION: Small to moderate right pleural effusion with right lower lobe atelectasis or infiltrate.  Small left pleural effusion with left basilar atelectasis.  No pulmonary edema.   Original Report Authenticated By: Natasha Mead, M.D.   Dg Chest 2 View  09/01/2012   *RADIOLOGY REPORT*  Clinical Data: Mitral valve repair in February, follow-up, some shortness of  breath  CHEST - 2 VIEW  Comparison: Portable chest x-ray of 07/01/2012  Findings: Pleuroparenchymal opacity remains at the right lung base most consistent with small right pleural effusion and right basilar atelectasis.  There is slight elevation of the right hemidiaphragm. The left lung is clear.  The heart is mildly enlarged and stable. Mitral valve replacement is noted.  There are degenerative changes throughout the thoracic spine and in both shoulders.  IMPRESSION: Opacity remains at the right lung base consistent with small right pleural effusion and right basilar atelectasis.   Original Report Authenticated By: Dwyane Dee, M.D.   Dg Chest Port 1 View  09/13/2012   *RADIOLOGY REPORT*  Clinical Data: Shortness of breath, pulmonary edema, pleural effusion  PORTABLE CHEST - 1 VIEW  Comparison: Portable exam 0624 hours compared to 09/12/2012  Findings: Right jugular central venous catheter tip projecting over SVC. Enlargement of cardiac silhouette with pulmonary vascular congestion. Mild perihilar infiltrates likely edema. Bibasilar effusions and atelectasis greater on the right. No pneumothorax. Bilateral glenohumeral degenerative changes and scattered thoracic spine endplate spur formation.  IMPRESSION: Minimal pulmonary edema. Bibasilar effusions and atelectasis greater on the right.   Original Report Authenticated By: Ulyses Southward, M.D.   Dg Chest Port 1 View  09/11/2012   *RADIOLOGY REPORT*  Clinical Data: Cough and shortness of breath.  PORTABLE CHEST - 1 VIEW  Comparison: 09/10/2012.  Findings: The right IJ catheter is stable.  The cardiac silhouette, mediastinal and hilar contours are unchanged.  There is a persistent right-sided pleural effusion and overlying atelectasis.  IMPRESSION: Persistent right pleural effusion and overlying atelectasis.   Original Report Authenticated By: Rudie Meyer, M.D.   Dg Chest Port 1 View  09/10/2012   *RADIOLOGY REPORT*  Clinical Data: Line placement.  PORTABLE  CHEST - 1 VIEW  Comparison: Plain film the chest 09/10/2012 at 12:05  Findings: New right IJ catheter is in place with the tip projecting over the lower superior vena cava.  No pneumothorax is identified. Right greater than left effusions and airspace disease persist without change.  IMPRESSION: Right IJ catheter tip projects in good position.  No acute abnormality.   Original Report Authenticated By: Holley Dexter, M.D.   Dg Chest Port 1 View  09/10/2012   *RADIOLOGY REPORT*  Clinical Data: Chest pressure, hypertension, diabetes, coronary artery disease  PORTABLE CHEST - 1 VIEW  Comparison: Portable exam 1205 hours compared to 09/08/2012  Findings: Rotated to the right. Mild enlargement of cardiac silhouette. Mediastinal contours normal. Mild pulmonary vascular congestion. Peribronchial thickening with increased right basilar pleural effusion and atelectasis. Minimal left pleural effusion and basilar atelectasis versus infiltrate again seen. Upper lungs clear. Bones demineralized. Bilateral glenohumeral degenerative changes and thoracic spine endplate spur formation.  IMPRESSION: Enlargement of cardiac silhouette with pulmonary  vascular congestion. Increased right basilar effusion and atelectasis. Persistent small left pleural effusion and atelectasis versus consolidation at left base. Mild bronchitic changes.   Original Report Authenticated By: Ulyses Southward, M.D.   US Thoracentesis Asp Pleural Space W/img Guide  09/08/2012   *RADIOLOGY REPORT*  Clinical Data:  Right pleural effusion  ULTRASOUND GUIDED right THORACENTESIS  Comparison:  None  An ultrasound guided thoracentesis was thoroughly discussed with the patient and questions answered.  The benefits, risks, alternatives and complications were also discussed.  The patient understands and wishes to proceed with the procedure.  Written consent was obtained.  Ultrasound was performed to localize and mark an adequate pocket of fluid in the right chest.  The  area was then prepped and draped in the normal sterile fashion.  1% Lidocaine was used for local anesthesia.  Under ultrasound guidance a 19 gauge Yueh catheter was introduced.  Thoracentesis was performed.  The catheter was removed and a dressing applied.  Complications:  None  Findings: A total of approximately 500 ml of yellow fluid was removed. A fluid sample was sent for laboratory analysis.  IMPRESSION: Successful ultrasound guided right thoracentesis yielding 500 ml of pleural fluid.  Read by: Ralene Muskrat, P.A.-C   Original Report Authenticated By: Malachy Moan, M.D.   Principal Problem:  Pleural effusion  Active Problems:  Diabetes type 2, uncontrolled  Chronic diastolic heart failure  Hypoxemia  UTI (lower urinary tract infection)  Bradycardia  Hypokalemia Severe MR s/p MV ring and MAZE Atrial fibrillation on systemic anticoagulation  Sinus node dysfunction resolved off amio and coreg  Mobilize but still pretty weak  Replete potassium Transfer to tele bed      Quintella Reichert, MD  09/13/2012  11:37 AM

## 2012-09-14 LAB — GLUCOSE, CAPILLARY: Glucose-Capillary: 139 mg/dL — ABNORMAL HIGH (ref 70–99)

## 2012-09-14 LAB — POTASSIUM: Potassium: 3.9 mEq/L (ref 3.5–5.1)

## 2012-09-14 MED ORDER — TRAMADOL HCL 50 MG PO TABS: 50.0000 mg | ORAL_TABLET | ORAL | Status: DC | PRN

## 2012-09-14 MED ORDER — WARFARIN SODIUM 1 MG PO TABS
1.0000 mg | ORAL_TABLET | Freq: Once | ORAL | Status: DC
  Filled 2012-09-14: qty 1

## 2012-09-14 MED ORDER — FUROSEMIDE 40 MG PO TABS: 40.0000 mg | ORAL_TABLET | Freq: Every day | ORAL | Status: DC

## 2012-09-14 NOTE — Progress Notes (Signed)
ANTICOAGULATION CONSULT NOTE - Follow Up Consult  Pharmacy Consult for Coumadin Indication: atrial fibrillation  Allergies  Allergen Reactions  . Codeine Nausea And Vomiting    Patient Measurements: Height: 5\' 3"  (160 cm) Weight: 137 lb 11.2 oz (62.46 kg) (scale b) IBW/kg (Calculated) : 52.4  Vital Signs: Temp: 98 F (36.7 C) (06/22 0500) Temp src: Oral (06/22 0500) BP: 133/55 mmHg (06/22 0500) Pulse Rate: 76 (06/22 0500)  Labs:  Recent Labs  09/12/12 0530 09/13/12 0450 09/14/12 0500  LABPROT 26.2* 25.5* 23.1*  INR 2.55* 2.46* 2.15*  CREATININE 1.28* 1.13*  --     Estimated Creatinine Clearance: 29.6 ml/min (by C-G formula based on Cr of 1.13).  Assessment: [name] [name] on chronic coumadin for afib, admitted 09/05/2012  with SOB, weakness sats in 80s, Found with UTI and R pleural effusion.  Pharmacy consulted to dose warfarin.  Home dose 2mg  daily except 1mg  on Sat/Sun  INR therapeutic today at 2..  No bleeding or complications noted.   Goal of Therapy:  INR 2-3 Monitor platelets by anticoagulation protocol: Yes   Plan:  - Warfarin 1 mg PO today - Daily PT/INR  Milanya Sunderland D. Dynasti Kerman, PharmD Clinical Pharmacist Pager: [phone] 09/14/2012 9:46 AM

## 2012-09-14 NOTE — Progress Notes (Signed)
Slept. No acute distress noted.

## 2012-09-14 NOTE — Discharge Instructions (Signed)
Follow with Primary MD HOLT,LYNLEY S, MDAnd he is hard Dr. in 7 days   [place] INR checked in 2 days at the nursing facility  Get CBC, CMP, checked 7 days by Primary MD and again as instructed by your Primary MD. Get a 2 view Chest X ray done next visit.  Get Medicines reviewed and adjusted.  Please request your Prim.MD to go over all Hospital Tests and Procedure/Radiological results at the follow up, please get all Hospital records sent to your Prim MD by signing hospital release before you go home.  Activity: As tolerated with Full fall precautions use walker/cane & assistance as needed   Diet:  Heart healthy - Low carbohydrate,  Fluid restriction 1.8 lit/day, Aspiration precautions.  For Heart failure patients - Check your Weight same time everyday, if you gain over 2 pounds, or you develop in leg swelling, experience more shortness of breath or chest pain, call your Primary MD immediately. Follow Cardiac Low Salt Diet and 1.8 lit/day fluid restriction.  Disposition SNF  If you experience worsening of your admission symptoms, develop shortness of breath, life threatening emergency, suicidal or homicidal thoughts you must seek medical attention immediately by calling 911 or calling your MD immediately  if symptoms less severe.  You Must read complete instructions/literature along with all the possible adverse reactions/side effects for all the Medicines you take and that have been prescribed to you. Take any new Medicines after you have completely understood and accpet all the possible adverse reactions/side effects.   Do not drive and provide baby sitting services if your were admitted for syncope or siezures until you have seen by Primary MD or a Neurologist and advised to do so again.  Do not drive when taking Pain medications.    Do not take more than prescribed Pain, Sleep and Anxiety Medications  Special Instructions: If you have smoked or chewed Tobacco  in the last 2 yrs please  stop smoking, stop any regular Alcohol  and or any Recreational drug use.  Wear Seat belts while driving.   Please note  You were cared for by a hospitalist during your hospital stay. If you have any questions about your discharge medications or the care you received while you were in the hospital after you are discharged, you can call the unit and asked to speak with the hospitalist on call if the hospitalist that took care of you is not available. Once you are discharged, your primary care physician will handle any further medical issues. Please note that NO REFILLS for any discharge medications will be authorized once you are discharged, as it is imperative that you return to your primary care physician (or establish a relationship with a primary care physician if you do not have one) for your aftercare needs so that they can reassess your need for medications and monitor your lab values.

## 2012-09-14 NOTE — Progress Notes (Signed)
Clinical Social Work Department CLINICAL SOCIAL WORK PLACEMENT NOTE 09/14/2012  Patient:  [name] [name], [name] [name]  Account Number:  [id number] Admit date:  09/05/2012  Clinical Social Worker:  Robin Searing  Date/time:  09/09/2012 02:14 PM  Clinical Social Work is seeking post-discharge placement for this patient at the following level of care:   SKILLED NURSING   (*CSW will update this form in Epic as items are completed)   09/09/2012  Patient/family provided with [facility] Department of Clinical Social Work's list of facilities offering this level of care within the geographic area requested by the patient (or if unable, by the patient's family).  09/09/2012  Patient/family informed of their freedom to choose among providers that offer the needed level of care, that participate in Medicare, Medicaid or managed care program needed by the patient, have an available bed and are willing to accept the patient.  09/09/2012  Patient/family informed of [facility]' ownership interest in [facility], as well as of the fact that they are under no obligation to receive care at this facility.  PASARR submitted to [employer] on 09/09/2012 PASARR number received from [employer] on 09/09/2012  FL2 transmitted to all facilities in geographic area requested by pt/family on  09/09/2012 FL2 transmitted to all facilities within larger geographic area on   Patient informed that his/her managed care company has contracts with or will negotiate with  certain facilities, including the following:     Patient/family informed of bed offers received:  09/10/2012 Patient chooses bed at [facility], [place] Physician recommends and patient chooses bed at    [facility] [place] on  09/14/2012 Patient to be transferred to facility by car  The following physician request were entered in Epic:   Additional Comments: Reece Levy, MSW,  Theresia Majors [phone]

## 2012-09-14 NOTE — Progress Notes (Signed)
Pt discharged by son, MIke , to [facility], by personal vehicle, spoke with Dorinda Hill, the nurse at [facility] for follow-up report, NAD upon discharge, Marisue Ivan, [place] discharged pt by wheelchair.

## 2012-09-14 NOTE — Discharge Summary (Signed)
[facility]                                                                                   [name] [name], is a 79 y.o. female  DOB 1924/12/06  MRN [id number].  Admission date:  09/05/2012  Discharge Date:  09/14/2012  Primary MD  Marylen Ponto, MD  Admitting Physician  Ron Parker, MD  Admission Diagnosis  Pleural effusion [511.9] UTI (lower urinary tract infection) [599.0] Hypoxia [799.02] Failure to thrive in adult [783.7]  Discharge Diagnosis     Principal Problem:   Pleural effusion Active Problems:   Diabetes type 2, uncontrolled   Acute on chronic diastolic heart failure   Hypoxemia   UTI (lower urinary tract infection)   Bradycardia   Chest pressure     Past Medical History  Diagnosis Date  . Hypertension   . Hyperlipidemia   . Type 2 diabetes mellitus   . Arthritis   . Coronary artery disease     a. Mild nonobstructive by cath 04/2012, cath complicated by groin hematoma.  . S/P mitral valve repair 05/20/2012    a. Severe MR - 04/2012: 26mm Sorin Memo 3D ring annuloplasty via right mini thoracotomy approach  . S/P Maze operation for atrial fibrillation 05/20/2012    a. 04/2012: Complete bi-atrial lesion set using cryothermy  . Atrial fibrillation     a. Diagnosed 04/2012 in the setting of severe MR. b. s/p TEE/successful DCCV 06/06/12.  Marland Kitchen PEA (Pulseless electrical activity)     a. 04/2012 -> adm with afib, developed PEA, transferred to [facility] for prolonged hospitalization.   . Chronic diastolic CHF (congestive heart failure)     Past Surgical History  Procedure Laterality Date  . Abdominal hysterectomy    . Cholecystectomy    . Eye surgery    . Appendectomy    . Tee without cardioversion N/A 05/09/2012    Procedure: TRANSESOPHAGEAL ECHOCARDIOGRAM (TEE);  Surgeon: Dolores Patty, MD;  Location: [facility] ENDOSCOPY;  Service: Cardiovascular;  Laterality: N/A;  . Mitral valve repair Right 05/20/2012    Procedure: MINIMALLY INVASIVE MITRAL VALVE REPAIR  (MVR);  Surgeon: Purcell Nails, MD;  Location: [facility] OR;  Service: Open Heart Surgery;  Laterality: Right;  . Minimally invasive maze procedure N/A 05/20/2012    Procedure: MINIMALLY INVASIVE MAZE PROCEDURE;  Surgeon: Purcell Nails, MD;  Location: [facility] OR;  Service: Open Heart Surgery;  Laterality: N/A;  . Intraoperative transesophageal echocardiogram N/A 05/20/2012    Procedure: INTRAOPERATIVE TRANSESOPHAGEAL ECHOCARDIOGRAM;  Surgeon: Purcell Nails, MD;  Location[facility] OR;  Service: Open Heart Surgery;  Laterality: N/A;  . Tee without cardioversion N/A 06/06/2012    Procedure: TRANSESOPHAGEAL ECHOCARDIOGRAM (TEE);  Surgeon: Dolores Patty, MD;  Location: [facility] ENDOSCOPY;  Service: Cardiovascular;  Laterality: N/A;  . Cardioversion N/A 06/06/2012    Procedure: CARDIOVERSION;  Surgeon: Dolores Patty, MD;  Location: [facility] ENDOSCOPY;  Service: Cardiovascular;  Laterality: N/A;     Recommendations for primary care physician for things to follow:      Discharge Diagnoses:   Principal Problem:   Pleural effusion Active Problems:   Diabetes type 2, uncontrolled   Acute on  chronic diastolic heart failure   Hypoxemia   UTI (lower urinary tract infection)   Bradycardia   Chest pressure    Discharge Condition: stable   Diet recommendation: See Discharge Instructions below   Consults  Cardiology, IR for thoracentesis, Pulmonary    History of present illness and  Hospital Course:     Patient was admitted for the last 9 days I assumed her care today and the day of discharge.  Kindly see H&P for history of present illness and admission details, please review complete Labs, Consult reports and Test reports for all details in brief [name] [name], is a 79 y.o. female, patient with history of Chronic diastolic heart failure, diabetes mellitus type 2, recent Valvular repair surgery was admitted to the hospital 9 days ago with shortness of breath Secondary to acute on chronic diastolic heart  failure causing pleural effusions and pulmonary edema, she was seen by cardiology, She was treated with diuresis, thoracentesis which was suggestive of transudative fluid collection, she gradually improved and now feels much better than except for generalized deconditioning she has no subjective complaints.    Patient did have problems with sinus bradycardia and she was seen by cardiologist Dr. Carolanne Grumbling who stopped her amiodarone and beta blocker with good effect, discussed her case with Dr. Mayford Knife this morning was cleared her for discharge and she has suggested not to resume amiodarone beta blocker upon discharge. She will followup with her primary cardiologist Dr. Jones Broom post discharge.    She has history of atrial flutter she status post DC cardioversion, she will continue her Coumadin with close monitoring, again her rate controlling agents have been held secondary to recent sinus bradycardia which has now resolved.  Lab Results  Component Value Date   INR 2.15* 09/14/2012   INR 2.46* 09/13/2012   INR 2.55* 09/12/2012    For her type 2 diabetes mellitus her home regimen will be continued unchanged.  Lab Results  Component Value Date   HGBA1C 5.4 09/08/2012     She had transudative pleural effusion which was tapped by IR was 500 cc of fluid removal Under the guidance of pulmonary critical care, one-time followup outpatient with pulmonary is recommended post discharge to document stability.   Patient recently had mitral valve repair on 05/20/2012. She is stable from this standpoint.   She had UTI with Klebsiella has been adequately treated with Rocephin for 4 days.     Today   Subjective:   [name] today has no headache,no chest abdominal pain,no new weakness tingling or numbness, feels much better.  Objective:   Blood pressure 133/55, pulse 76, temperature 98 F (36.7 C), temperature source Oral, resp. rate 18, height 5\' 3"  (1.6 m), weight 62.46 kg (137 lb 11.2  oz), SpO2 95.00%.   Intake/Output Summary (Last 24 hours) at 09/14/12 1025 Last data filed at 09/14/12 1020  Gross per 24 hour  Intake   1050 ml  Output    500 ml  Net    550 ml    Exam Awake Alert, Oriented *3, No new F.N deficits, Normal affect [place].AT,PERRAL Supple Neck,No JVD, No cervical lymphadenopathy appriciated.  Symmetrical Chest wall movement, Good air movement bilaterally, CTAB RRR,No Gallops,Rubs or new Murmurs, No Parasternal Heave +ve B.Sounds, Abd Soft, Non tender, No organomegaly appriciated, No rebound -guarding or rigidity. No Cyanosis, Clubbing or edema, No new Rash or bruise  Data Review   Major procedures and Radiology Reports - PLEASE review detailed and final reports for all  details in brief -       Dg Chest 1 View  09/08/2012   *RADIOLOGY REPORT*  Clinical Data: Status post right thoracentesis.  CHEST - 1 VIEW  Comparison: 09/05/2012.  Findings: The previously seen right pleural fluid is no longer demonstrated.  No pneumothorax.  Decreased linear density at the right lung base.  Increased linear and patchy density at the left lung base.  The cardiac silhouette remains borderline enlarged. The interstitial markings remain mildly prominent.  Bilateral shoulder degenerative changes and thoracic spine degenerative changes.  Bilateral subcoracoid bursa calcified loose bodies.  IMPRESSION:  1.  Resolved pleural fluid on the right with no pneumothorax following thoracentesis. 2.  Decreased right basilar atelectasis. 3.  Increased left basilar atelectasis and possible pneumonia. 4.  Stable mild chronic interstitial lung disease.   Original Report Authenticated By: Beckie Salts, M.D.   Dg Chest 2 View  09/12/2012   *RADIOLOGY REPORT*  Clinical Data: Right pleural effusion. Cough.  CHEST - 2 VIEW  Comparison: Chest x-rays dated 09/11/2012 and 09/05/2012  Findings:  Central venous catheter tip is in good position in the superior vena cava.  Heart size and vascularity are  normal.  Slight decrease in the right base atelectasis and effusion.  There is also a tiny left effusion.  No pneumothorax.  No acute osseous abnormality.  IMPRESSION: Slight decrease in right base atelectasis and effusion.  Small left effusion.   Original Report Authenticated By: Francene Boyers, M.D.   Dg Chest 2 View  09/05/2012   *RADIOLOGY REPORT*  Clinical Data: fatigue  CHEST - 2 VIEW  Comparison: 09/01/2012  Findings: Borderline cardiomegaly.  Small to moderate right pleural effusion with right lower lobe atelectasis or infiltrate.  Small left pleural effusion with left basilar atelectasis.  No pulmonary edema.  IMPRESSION: Small to moderate right pleural effusion with right lower lobe atelectasis or infiltrate.  Small left pleural effusion with left basilar atelectasis.  No pulmonary edema.   Original Report Authenticated By: Natasha Mead, M.D.   Dg Chest 2 View  09/01/2012   *RADIOLOGY REPORT*  Clinical Data: Mitral valve repair in February, follow-up, some shortness of breath  CHEST - 2 VIEW  Comparison: Portable chest x-ray of 07/01/2012  Findings: Pleuroparenchymal opacity remains at the right lung base most consistent with small right pleural effusion and right basilar atelectasis.  There is slight elevation of the right hemidiaphragm. The left lung is clear.  The heart is mildly enlarged and stable. Mitral valve replacement is noted.  There are degenerative changes throughout the thoracic spine and in both shoulders.  IMPRESSION: Opacity remains at the right lung base consistent with small right pleural effusion and right basilar atelectasis.   Original Report Authenticated By: Dwyane Dee, M.D.   Dg Chest Port 1 View  09/13/2012   *RADIOLOGY REPORT*  Clinical Data: Shortness of breath, pulmonary edema, pleural effusion  PORTABLE CHEST - 1 VIEW  Comparison: Portable exam 0624 hours compared to 09/12/2012  Findings: Right jugular central venous catheter tip projecting over SVC. Enlargement of cardiac  silhouette with pulmonary vascular congestion. Mild perihilar infiltrates likely edema. Bibasilar effusions and atelectasis greater on the right. No pneumothorax. Bilateral glenohumeral degenerative changes and scattered thoracic spine endplate spur formation.  IMPRESSION: Minimal pulmonary edema. Bibasilar effusions and atelectasis greater on the right.   Original Report Authenticated By: Ulyses Southward, M.D.   Dg Chest Port 1 View  09/11/2012   *RADIOLOGY REPORT*  Clinical Data: Cough and shortness of breath.  PORTABLE  CHEST - 1 VIEW  Comparison: 09/10/2012.  Findings: The right IJ catheter is stable.  The cardiac silhouette, mediastinal and hilar contours are unchanged.  There is a persistent right-sided pleural effusion and overlying atelectasis.  IMPRESSION: Persistent right pleural effusion and overlying atelectasis.   Original Report Authenticated By: Rudie Meyer, M.D.   Dg Chest Port 1 View  09/10/2012   *RADIOLOGY REPORT*  Clinical Data: Line placement.  PORTABLE CHEST - 1 VIEW  Comparison: Plain film the chest 09/10/2012 at 12:05  Findings: New right IJ catheter is in place with the tip projecting over the lower superior vena cava.  No pneumothorax is identified. Right greater than left effusions and airspace disease persist without change.  IMPRESSION: Right IJ catheter tip projects in good position.  No acute abnormality.   Original Report Authenticated By: Holley Dexter, M.D.   Dg Chest Port 1 View  09/10/2012   *RADIOLOGY REPORT*  Clinical Data: Chest pressure, hypertension, diabetes, coronary artery disease  PORTABLE CHEST - 1 VIEW  Comparison: Portable exam 1205 hours compared to 09/08/2012  Findings: Rotated to the right. Mild enlargement of cardiac silhouette. Mediastinal contours normal. Mild pulmonary vascular congestion. Peribronchial thickening with increased right basilar pleural effusion and atelectasis. Minimal left pleural effusion and basilar atelectasis versus infiltrate again  seen. Upper lungs clear. Bones demineralized. Bilateral glenohumeral degenerative changes and thoracic spine endplate spur formation.  IMPRESSION: Enlargement of cardiac silhouette with pulmonary vascular congestion. Increased right basilar effusion and atelectasis. Persistent small left pleural effusion and atelectasis versus consolidation at left base. Mild bronchitic changes.   Original Report Authenticated By: Ulyses Southward, M.D.   US Thoracentesis Asp Pleural Space W/img Guide  09/08/2012   *RADIOLOGY REPORT*  Clinical Data:  Right pleural effusion  ULTRASOUND GUIDED right THORACENTESIS  Comparison:  None  An ultrasound guided thoracentesis was thoroughly discussed with the patient and questions answered.  The benefits, risks, alternatives and complications were also discussed.  The patient understands and wishes to proceed with the procedure.  Written consent was obtained.  Ultrasound was performed to localize and mark an adequate pocket of fluid in the right chest.  The area was then prepped and draped in the normal sterile fashion.  1% Lidocaine was used for local anesthesia.  Under ultrasound guidance a 19 gauge Yueh catheter was introduced.  Thoracentesis was performed.  The catheter was removed and a dressing applied.  Complications:  None  Findings: A total of approximately 500 ml of yellow fluid was removed. A fluid sample was sent for laboratory analysis.  IMPRESSION: Successful ultrasound guided right thoracentesis yielding 500 ml of pleural fluid.  Read by: Ralene Muskrat, P.A.-C   Original Report Authenticated By: Malachy Moan, M.D.    Micro Results      Recent Results (from the past 240 hour(s))  URINE CULTURE     Status: None   Collection Time    09/05/12 10:22 PM      Result Value Range Status   Specimen Description URINE, RANDOM   Final   Special Requests NONE   Final   Culture  Setup Time 09/06/2012 16:58   Final   Colony Count >=100,000 COLONIES/ML   Final   Culture  KLEBSIELLA PNEUMONIAE   Final   Report Status 09/08/2012 FINAL   Final   Organism ID, Bacteria KLEBSIELLA PNEUMONIAE   Final  MRSA PCR SCREENING     Status: Abnormal   Collection Time    09/06/12  4:52 AM  Result Value Range Status   MRSA by PCR POSITIVE (*) NEGATIVE Final   Comment:            The GeneXpert MRSA Assay (FDA     approved for NASAL specimens     only), is one component of a     comprehensive MRSA colonization     surveillance program. It is not     intended to diagnose MRSA     infection nor to guide or     monitor treatment for     MRSA infections.     RESULT CALLED TO, READ BACK BY AND VERIFIED WITH:     LINDSAY J.,RN 09/06/12 0818 BY JONESJ  CULTURE, BLOOD (ROUTINE X 2)     Status: None   Collection Time    09/06/12  5:50 AM      Result Value Range Status   Specimen Description BLOOD LEFT ARM   Final   Special Requests BOTTLES DRAWN AEROBIC AND ANAEROBIC 10CC EACH   Final   Culture  Setup Time 09/06/2012 13:30   Final   Culture NO GROWTH 5 DAYS   Final   Report Status 09/11/2012 FINAL   Final  CULTURE, BLOOD (ROUTINE X 2)     Status: None   Collection Time    09/06/12  5:55 AM      Result Value Range Status   Specimen Description BLOOD LEFT ARM   Final   Special Requests BOTTLES DRAWN AEROBIC AND ANAEROBIC 10CC EACH   Final   Culture  Setup Time 09/06/2012 13:30   Final   Culture NO GROWTH 5 DAYS   Final   Report Status 09/11/2012 FINAL   Final  BODY FLUID CULTURE     Status: None   Collection Time    09/08/12  3:01 PM      Result Value Range Status   Specimen Description PLEURAL FLUID RIGHT   Final   Special Requests 60 ML FLUID   Final   Gram Stain     Final   Value: NO WBC SEEN     NO ORGANISMS SEEN   Culture NO GROWTH 3 DAYS   Final   Report Status 09/12/2012 FINAL   Final  URINE CULTURE     Status: None   Collection Time    09/08/12  5:04 PM      Result Value Range Status   Specimen Description URINE, CLEAN CATCH   Final   Special  Requests Normal   Final   Culture  Setup Time 09/08/2012 18:45   Final   Colony Count NO GROWTH   Final   Culture NO GROWTH   Final   Report Status 09/09/2012 FINAL   Final     CBC w Diff: Lab Results  Component Value Date   WBC 6.6 09/11/2012   HGB 10.8* 09/11/2012   HCT 32.4* 09/11/2012   PLT 249 09/11/2012   LYMPHOPCT 14 09/05/2012   MONOPCT 8 09/05/2012   EOSPCT 2 09/05/2012   BASOPCT 0 09/05/2012    CMP: Lab Results  Component Value Date   NA 136 09/13/2012   K 3.9 09/14/2012   CL 100 09/13/2012   CO2 30 09/13/2012   BUN 20 09/13/2012   CREATININE 1.13* 09/13/2012   PROT 5.9* 09/11/2012   ALBUMIN 2.9* 09/11/2012   BILITOT 0.6 09/11/2012   ALKPHOS 109 09/11/2012   AST 48* 09/11/2012   ALT 54* 09/11/2012  . Lab Results  Component Value Date  INR 2.15* 09/14/2012   INR 2.46* 09/13/2012   INR 2.55* 09/12/2012     Discharge Instructions     Follow with Primary MD HOLT,LYNLEY S, MDAnd he is hard Dr. in 7 days   [place] INR checked in 2 days at the nursing facility  Get CBC, CMP, checked 7 days by Primary MD and again as instructed by your Primary MD. Get a 2 view Chest X ray done next visit.  Get Medicines reviewed and adjusted.  Please request your Prim.MD to go over all Hospital Tests and Procedure/Radiological results at the follow up, please get all Hospital records sent to your Prim MD by signing hospital release before you go home.  Activity: As tolerated with Full fall precautions use walker/cane & assistance as needed   Diet:  Heart healthy - Low carbohydrate,  Fluid restriction 1.8 lit/day, Aspiration precautions.  For Heart failure patients - Check your Weight same time everyday, if you gain over 2 pounds, or you develop in leg swelling, experience more shortness of breath or chest pain, call your Primary MD immediately. Follow Cardiac Low Salt Diet and 1.8 lit/day fluid restriction.  Disposition SNF  If you experience worsening of your admission symptoms, develop  shortness of breath, life threatening emergency, suicidal or homicidal thoughts you must seek medical attention immediately by calling 911 or calling your MD immediately  if symptoms less severe.  You Must read complete instructions/literature along with all the possible adverse reactions/side effects for all the Medicines you take and that have been prescribed to you. Take any new Medicines after you have completely understood and accpet all the possible adverse reactions/side effects.   Do not drive and provide baby sitting services if your were admitted for syncope or siezures until you have seen by Primary MD or a Neurologist and advised to do so again.  Do not drive when taking Pain medications.    Do not take more than prescribed Pain, Sleep and Anxiety Medications  Special Instructions: If you have smoked or chewed Tobacco  in the last 2 yrs please stop smoking, stop any regular Alcohol  and or any Recreational drug use.  Wear Seat belts while driving.   Please note  You were cared for by a hospitalist during your hospital stay. If you have any questions about your discharge medications or the care you received while you were in the hospital after you are discharged, you can call the unit and asked to speak with the hospitalist on call if the hospitalist that took care of you is not available. Once you are discharged, your primary care physician will handle any further medical issues. Please note that NO REFILLS for any discharge medications will be authorized once you are discharged, as it is imperative that you return to your primary care physician (or establish a relationship with a primary care physician if you do not have one) for your aftercare needs so that they can reassess your need for medications and monitor your lab values.        Follow-up Information   Follow up with HOLT,LYNLEY S, MD. Schedule an appointment as soon as possible for a visit in 1 week.   Contact  information:   [address] [place] [address] [phone]       Follow up with Arvilla Meres, MD. Schedule an appointment as soon as possible for a visit in 1 week.   Contact information:   [address] [place] [address] [phone]  Follow up with Sandrea Hughs, MD. Schedule an appointment as soon as possible for a visit in 1 week.   Contact information:   [address] [place] [address] [phone]         Discharge Medications     Medication List    STOP taking these medications       amiodarone 200 MG tablet  Commonly known as:  PACERONE      TAKE these medications       citalopram 10 MG tablet  Commonly known as:  CELEXA  Take 10 mg by mouth daily.     furosemide 40 MG tablet  Commonly known as:  LASIX  Take 1 tablet (40 mg total) by mouth daily.     glimepiride 1 MG tablet  Commonly known as:  AMARYL  Take 1 mg by mouth daily before breakfast.     insulin glargine 100 UNIT/ML injection  Commonly known as:  LANTUS  Inject 18 Units into the skin at bedtime.     magnesium oxide 400 MG tablet  Commonly known as:  MAG-OX  Take 1 tablet (400 mg total) by mouth 2 (two) times daily.     meclizine 25 MG tablet  Commonly known as:  ANTIVERT  Take 25 mg by mouth daily as needed for dizziness.     metFORMIN 500 MG tablet  Commonly known as:  GLUCOPHAGE  Take 500 mg by mouth 2 (two) times daily with a meal.     pravastatin 40 MG tablet  Commonly known as:  PRAVACHOL  Take 40 mg by mouth at bedtime.     rOPINIRole 0.5 MG tablet  Commonly known as:  REQUIP  Take 0.5 mg by mouth at bedtime.     traMADol 50 MG tablet  Commonly known as:  ULTRAM  Take 50-100 mg by mouth every 4 (four) hours as needed for pain.     traZODone 50 MG tablet  Commonly known as:  DESYREL  Take 50 mg by mouth at bedtime.     warfarin 2 MG tablet  Commonly known as:  COUMADIN  Take 1-2 mg by mouth daily. Sat & Sun only-0.5  half tab (1 mg total); All other days 1 tab (2 mg total)           Total Time in preparing paper work, data evaluation and todays exam - 35 minutes  Leroy Sea M.D on 09/14/2012 at 10:25 AM  [facility] Office  [phone]

## 2012-09-14 NOTE — Progress Notes (Signed)
Patient for d/c today to SNF bed at [facility]- confirmed plans with patient, family and SNF rep- plan transfer this morning via son's car- Reece Levy, MSW, [place] [phone]

## 2012-09-19 ENCOUNTER — Encounter (HOSPITAL_COMMUNITY): Payer: Self-pay | Admitting: *Deleted

## 2012-09-19 ENCOUNTER — Inpatient Hospital Stay (HOSPITAL_COMMUNITY)
Admission: AD | Admit: 2012-09-19 | Discharge: 2012-09-22 | DRG: 243 | Disposition: A | Discharge status: Skilled Nursing Facility | Payer: Medicare Other | Source: Other Acute Inpatient Hospital | Attending: Internal Medicine | Admitting: Internal Medicine

## 2012-09-19 ENCOUNTER — Encounter (HOSPITAL_COMMUNITY)
Admission: AD | Disposition: A | Discharge status: Skilled Nursing Facility | Payer: Self-pay | Source: Other Acute Inpatient Hospital | Attending: Internal Medicine

## 2012-09-19 DIAGNOSIS — S81809A Unspecified open wound, unspecified lower leg, initial encounter: Secondary | ICD-10-CM | POA: Diagnosis present

## 2012-09-19 DIAGNOSIS — R791 Abnormal coagulation profile: Secondary | ICD-10-CM | POA: Diagnosis present

## 2012-09-19 DIAGNOSIS — S81009A Unspecified open wound, unspecified knee, initial encounter: Secondary | ICD-10-CM | POA: Diagnosis present

## 2012-09-19 DIAGNOSIS — IMO0002 Reserved for concepts with insufficient information to code with codable children: Secondary | ICD-10-CM | POA: Diagnosis not present

## 2012-09-19 DIAGNOSIS — Z9889 Other specified postprocedural states: Secondary | ICD-10-CM

## 2012-09-19 DIAGNOSIS — I5032 Chronic diastolic (congestive) heart failure: Secondary | ICD-10-CM | POA: Diagnosis present

## 2012-09-19 DIAGNOSIS — Z8674 Personal history of sudden cardiac arrest: Secondary | ICD-10-CM

## 2012-09-19 DIAGNOSIS — E785 Hyperlipidemia, unspecified: Secondary | ICD-10-CM | POA: Diagnosis present

## 2012-09-19 DIAGNOSIS — I4891 Unspecified atrial fibrillation: Secondary | ICD-10-CM | POA: Diagnosis present

## 2012-09-19 DIAGNOSIS — Z87440 Personal history of urinary (tract) infections: Secondary | ICD-10-CM

## 2012-09-19 DIAGNOSIS — I2510 Atherosclerotic heart disease of native coronary artery without angina pectoris: Secondary | ICD-10-CM | POA: Diagnosis present

## 2012-09-19 DIAGNOSIS — I495 Sick sinus syndrome: Secondary | ICD-10-CM

## 2012-09-19 DIAGNOSIS — W07XXXA Fall from chair, initial encounter: Secondary | ICD-10-CM | POA: Diagnosis present

## 2012-09-19 DIAGNOSIS — R55 Syncope and collapse: Secondary | ICD-10-CM

## 2012-09-19 DIAGNOSIS — I498 Other specified cardiac arrhythmias: Principal | ICD-10-CM | POA: Diagnosis present

## 2012-09-19 DIAGNOSIS — E1169 Type 2 diabetes mellitus with other specified complication: Secondary | ICD-10-CM | POA: Diagnosis not present

## 2012-09-19 DIAGNOSIS — Z7901 Long term (current) use of anticoagulants: Secondary | ICD-10-CM

## 2012-09-19 DIAGNOSIS — M129 Arthropathy, unspecified: Secondary | ICD-10-CM | POA: Diagnosis present

## 2012-09-19 DIAGNOSIS — I10 Essential (primary) hypertension: Secondary | ICD-10-CM | POA: Diagnosis present

## 2012-09-19 DIAGNOSIS — Y9210 Unspecified residential institution as the place of occurrence of the external cause: Secondary | ICD-10-CM | POA: Diagnosis not present

## 2012-09-19 DIAGNOSIS — Z794 Long term (current) use of insulin: Secondary | ICD-10-CM

## 2012-09-19 DIAGNOSIS — Y831 Surgical operation with implant of artificial internal device as the cause of abnormal reaction of the patient, or of later complication, without mention of misadventure at the time of the procedure: Secondary | ICD-10-CM | POA: Diagnosis not present

## 2012-09-19 DIAGNOSIS — D649 Anemia, unspecified: Secondary | ICD-10-CM | POA: Diagnosis present

## 2012-09-19 DIAGNOSIS — I2789 Other specified pulmonary heart diseases: Secondary | ICD-10-CM | POA: Diagnosis present

## 2012-09-19 DIAGNOSIS — Z79899 Other long term (current) drug therapy: Secondary | ICD-10-CM

## 2012-09-19 DIAGNOSIS — Z8679 Personal history of other diseases of the circulatory system: Secondary | ICD-10-CM

## 2012-09-19 DIAGNOSIS — R001 Bradycardia, unspecified: Secondary | ICD-10-CM | POA: Diagnosis present

## 2012-09-19 HISTORY — DX: Bradycardia, unspecified: R00.1

## 2012-09-19 HISTORY — DX: Pulmonary hypertension, unspecified: I27.20

## 2012-09-19 HISTORY — PX: PACEMAKER INSERTION: SHX728

## 2012-09-19 HISTORY — DX: Reserved for concepts with insufficient information to code with codable children: IMO0002

## 2012-09-19 HISTORY — PX: PERMANENT PACEMAKER INSERTION: SHX5480

## 2012-09-19 HISTORY — DX: Hypoxemia: R09.02

## 2012-09-19 HISTORY — DX: Pleural effusion, not elsewhere classified: J90

## 2012-09-19 LAB — BASIC METABOLIC PANEL
BUN: 30 mg/dL — ABNORMAL HIGH (ref 6–23)
Calcium: 9 mg/dL (ref 8.4–10.5)
Creatinine, Ser: 1.49 mg/dL — ABNORMAL HIGH (ref 0.50–1.10)
GFR calc non Af Amer: 31 mL/min — ABNORMAL LOW (ref >90)
Glucose, Bld: 62 mg/dL — ABNORMAL LOW (ref 70–99)
Potassium: 3.8 mEq/L (ref 3.5–5.1)

## 2012-09-19 LAB — PROTIME-INR
INR: 2.64 — ABNORMAL HIGH (ref 0.00–1.49)
Prothrombin Time: 27.3 s — ABNORMAL HIGH (ref 11.6–15.2)

## 2012-09-19 LAB — BLOOD GAS, ARTERIAL
Bicarbonate: 26.9 mEq/L — ABNORMAL HIGH (ref 20.0–24.0)
O2 Saturation: 95 %
TCO2: 28.1 mmol/L (ref 0–100)
pO2, Arterial: 73.3 mmHg — ABNORMAL LOW (ref 80.0–100.0)

## 2012-09-19 LAB — CBC
HCT: 30.9 % — ABNORMAL LOW (ref 36.0–46.0)
Hemoglobin: 10.1 g/dL — ABNORMAL LOW (ref 12.0–15.0)
MCH: 30 pg (ref 26.0–34.0)
MCHC: 32.7 g/dL (ref 30.0–36.0)
MCV: 91.7 fL (ref 78.0–100.0)
Platelets: 251 K/uL (ref 150–400)
RBC: 3.37 MIL/uL — ABNORMAL LOW (ref 3.87–5.11)
RDW: 14.1 % (ref 11.5–15.5)
WBC: 5.9 K/uL (ref 4.0–10.5)

## 2012-09-19 LAB — GLUCOSE, CAPILLARY
Glucose-Capillary: 117 mg/dL — ABNORMAL HIGH (ref 70–99)
Glucose-Capillary: 95 mg/dL (ref 70–99)

## 2012-09-19 SURGERY — PERMANENT PACEMAKER INSERTION
Anesthesia: LOCAL

## 2012-09-19 MED ORDER — FUROSEMIDE 40 MG PO TABS
40.0000 mg | ORAL_TABLET | Freq: Every day | ORAL | Status: DC
  Administered 2012-09-20 – 2012-09-22 (×3): 40 mg via ORAL
  Filled 2012-09-19 (×4): qty 1

## 2012-09-19 MED ORDER — DEXTROSE 50 % IV SOLN
12.5000 g | Freq: Once | INTRAVENOUS | Status: AC
  Administered 2012-09-19: 12.5 g via INTRAVENOUS

## 2012-09-19 MED ORDER — MECLIZINE HCL 25 MG PO TABS
25.0000 mg | ORAL_TABLET | Freq: Every day | ORAL | Status: DC | PRN
  Filled 2012-09-19: qty 1

## 2012-09-19 MED ORDER — CEFAZOLIN SODIUM 1-5 GM-% IV SOLN
1.0000 g | Freq: Four times a day (QID) | INTRAVENOUS | Status: AC
  Administered 2012-09-19 – 2012-09-20 (×3): 1 g via INTRAVENOUS
  Filled 2012-09-19 (×3): qty 50

## 2012-09-19 MED ORDER — CHLORHEXIDINE GLUCONATE 4 % EX LIQD
60.0000 mL | Freq: Once | CUTANEOUS | Status: AC
Start: 2012-09-19 — End: 2012-09-19
  Administered 2012-09-19: 4 via TOPICAL
  Filled 2012-09-19: qty 15

## 2012-09-19 MED ORDER — SODIUM BICARBONATE 8.4 % IV SOLN
INTRAVENOUS | Status: AC
  Filled 2012-09-19: qty 50

## 2012-09-19 MED ORDER — CEFAZOLIN SODIUM-DEXTROSE 2-3 GM-% IV SOLR
2.0000 g | INTRAVENOUS | Status: DC
  Filled 2012-09-19: qty 50

## 2012-09-19 MED ORDER — SODIUM CHLORIDE 0.9 % IJ SOLN: 3.0000 mL | INTRAMUSCULAR | Status: DC | PRN

## 2012-09-19 MED ORDER — LIDOCAINE HCL (PF) 1 % IJ SOLN
INTRAMUSCULAR | Status: AC
  Filled 2012-09-19: qty 60

## 2012-09-19 MED ORDER — DEXTROSE 50 % IV SOLN
INTRAVENOUS | Status: AC
  Filled 2012-09-19: qty 50

## 2012-09-19 MED ORDER — ROPINIROLE HCL 0.5 MG PO TABS
0.5000 mg | ORAL_TABLET | Freq: Every day | ORAL | Status: DC
  Administered 2012-09-19 – 2012-09-21 (×3): 0.5 mg via ORAL
  Filled 2012-09-19 (×5): qty 1

## 2012-09-19 MED ORDER — TRAZODONE HCL 50 MG PO TABS
50.0000 mg | ORAL_TABLET | Freq: Every day | ORAL | Status: DC
  Administered 2012-09-19 – 2012-09-21 (×3): 50 mg via ORAL
  Filled 2012-09-19 (×5): qty 1

## 2012-09-19 MED ORDER — SODIUM CHLORIDE 0.9 % IJ SOLN
3.0000 mL | Freq: Two times a day (BID) | INTRAMUSCULAR | Status: DC
  Administered 2012-09-20 – 2012-09-21 (×3): 3 mL via INTRAVENOUS

## 2012-09-19 MED ORDER — SODIUM CHLORIDE 0.9 % IR SOLN
80.0000 mg | Status: DC
  Filled 2012-09-19: qty 2

## 2012-09-19 MED ORDER — TRAMADOL HCL 50 MG PO TABS
50.0000 mg | ORAL_TABLET | ORAL | Status: DC | PRN
  Administered 2012-09-19: 100 mg via ORAL
  Filled 2012-09-19 (×2): qty 2

## 2012-09-19 MED ORDER — ONDANSETRON HCL 4 MG/2ML IJ SOLN: 4.0000 mg | Freq: Four times a day (QID) | INTRAMUSCULAR | Status: DC | PRN

## 2012-09-19 MED ORDER — MIDAZOLAM HCL 5 MG/5ML IJ SOLN
INTRAMUSCULAR | Status: AC
  Filled 2012-09-19: qty 5

## 2012-09-19 MED ORDER — ACETAMINOPHEN 325 MG PO TABS: 650.0000 mg | ORAL_TABLET | ORAL | Status: DC | PRN

## 2012-09-19 MED ORDER — WARFARIN SODIUM 1 MG PO TABS: 1.0000 mg | ORAL_TABLET | Freq: Once | ORAL | Status: DC

## 2012-09-19 MED ORDER — SODIUM CHLORIDE 0.9 % IV SOLN
INTRAVENOUS | Status: DC
  Administered 2012-09-19: 09:00:00 via INTRAVENOUS

## 2012-09-19 MED ORDER — SODIUM CHLORIDE 0.9 % IV SOLN: 250.0000 mL | INTRAVENOUS | Status: DC | PRN

## 2012-09-19 MED ORDER — CHLORHEXIDINE GLUCONATE 4 % EX LIQD: 60.0000 mL | Freq: Once | CUTANEOUS | Status: DC

## 2012-09-19 MED ORDER — METFORMIN HCL 500 MG PO TABS
500.0000 mg | ORAL_TABLET | Freq: Two times a day (BID) | ORAL | Status: DC
  Administered 2012-09-20 – 2012-09-22 (×4): 500 mg via ORAL
  Filled 2012-09-19 (×10): qty 1

## 2012-09-19 MED ORDER — LIDOCAINE HCL (PF) 1 % IJ SOLN
INTRAMUSCULAR | Status: AC
  Filled 2012-09-19: qty 30

## 2012-09-19 MED ORDER — CITALOPRAM HYDROBROMIDE 10 MG PO TABS
10.0000 mg | ORAL_TABLET | Freq: Every day | ORAL | Status: DC
  Administered 2012-09-20 – 2012-09-22 (×3): 10 mg via ORAL
  Filled 2012-09-19 (×4): qty 1

## 2012-09-19 MED ORDER — SIMVASTATIN 20 MG PO TABS
20.0000 mg | ORAL_TABLET | Freq: Every day | ORAL | Status: DC
  Administered 2012-09-19 – 2012-09-20 (×2): 20 mg via ORAL
  Filled 2012-09-19 (×5): qty 1

## 2012-09-19 MED ORDER — HYDROCODONE-ACETAMINOPHEN 5-325 MG PO TABS
1.0000 | ORAL_TABLET | ORAL | Status: DC | PRN
  Administered 2012-09-20 – 2012-09-21 (×2): 2 via ORAL
  Filled 2012-09-19 (×2): qty 2

## 2012-09-19 MED ORDER — ACETAMINOPHEN 325 MG PO TABS
ORAL_TABLET | ORAL | Status: AC
  Filled 2012-09-19: qty 2

## 2012-09-19 MED ORDER — ACETAMINOPHEN 325 MG PO TABS
325.0000 mg | ORAL_TABLET | ORAL | Status: DC | PRN
  Administered 2012-09-19: 650 mg via ORAL

## 2012-09-19 MED ORDER — INSULIN GLARGINE 100 UNIT/ML ~~LOC~~ SOLN
18.0000 [IU] | Freq: Every day | SUBCUTANEOUS | Status: DC
  Administered 2012-09-19 – 2012-09-21 (×3): 18 [IU] via SUBCUTANEOUS
  Filled 2012-09-19 (×4): qty 0.18

## 2012-09-19 MED ORDER — NITROGLYCERIN 0.4 MG SL SUBL: 0.4000 mg | SUBLINGUAL_TABLET | SUBLINGUAL | Status: DC | PRN

## 2012-09-19 MED ORDER — MAGNESIUM OXIDE 400 (241.3 MG) MG PO TABS
400.0000 mg | ORAL_TABLET | Freq: Two times a day (BID) | ORAL | Status: DC
  Administered 2012-09-19 – 2012-09-22 (×5): 400 mg via ORAL
  Filled 2012-09-19 (×10): qty 1

## 2012-09-19 NOTE — Progress Notes (Signed)
INITIAL NUTRITION ASSESSMENT  DOCUMENTATION CODES Per approved criteria  -Not Applicable   INTERVENTION: 1. Ensure Complete po BID, each supplement provides 350 kcal and 13 grams of protein. Once diet has been advanced.   NUTRITION DIAGNOSIS: Unintentional weight loss related to poor oral intake and fluid status as evidenced by weight trends.   Goal: PO intake to support weight maintenance   Monitor:  PO intake, weight trends, labs, I/O's  Reason for Assessment: Malnutrition Screening Tool  79 y.o. female  Admitting Dx:   ASSESSMENT: Pt admitted from SNF for a syncopal episode. Pt fell from chair and sustained a laceration on knee and pain in posterior scalp. CT of head at OSH was negative. Recently had MV repair and MAZE procedure on 2/25. At this admission found to have ongoing issues with significant JB/bradycardia, Afib and possible AVHB.  Pt and family report pt was losing weight at SNF until recently when appetite improved. Was drinking Ensure 1-2 times daily. Family would also bring in additional foods for pt.  Weight hx shows 27 lb weight loss in the past 4-5 months (16% body weight). Pt lost about 18 lbs at last admission, likely some of this weight loss was fluid related given was on Lasix. Weight loss since d/c was 9 lbs (6% body weight) in the past 3 months, with an up trend in weight over the past few days.   Height: Ht Readings from Last 1 Encounters:  09/19/12 5\' 4"  (1.626 m)    Weight: Wt Readings from Last 1 Encounters:  09/19/12 142 lb 13.7 oz (64.8 kg)    Ideal Body Weight: 120 lbs   % Ideal Body Weight: 118%  Wt Readings from Last 10 Encounters:  09/19/12 142 lb 13.7 oz (64.8 kg)  09/19/12 142 lb 13.7 oz (64.8 kg)  09/14/12 137 lb 11.2 oz (62.46 kg)  09/01/12 144 lb (65.318 kg)  07/24/12 146 lb 12.8 oz (66.588 kg)  07/08/12 148 lb 12.8 oz (67.495 kg)  06/16/12 147 lb (66.679 kg)  06/12/12 149 lb 1.9 oz (67.64 kg)  05/27/12 151 lb 10.8 oz (68.8  kg)  05/27/12 151 lb 10.8 oz (68.8 kg)  05/06/12 169 lbs  Usual Body Weight: 169 lbs   % Usual Body Weight: 84%  BMI:  Body mass index is 24.51 kg/(m^2). WNL  Estimated Nutritional Needs: Kcal: 1300-1500 Protein: 65-75 gm  Fluid: 1.3-1.5 L   Skin: stage I on coccyx  Diet Order: NPO  EDUCATION NEEDS: -No education needs identified at this time   Intake/Output Summary (Last 24 hours) at 09/19/12 1051 Last data filed at 09/19/12 0800  Gross per 24 hour  Intake      0 ml  Output    450 ml  Net   -450 ml    Last BM: PTA   Labs:   Recent Labs Lab 09/13/12 0450 09/14/12 0802 09/19/12 0445  NA 136  --  137  K 3.3* 3.9 3.8  CL 100  --  100  CO2 30  --  30  BUN 20  --  30*  CREATININE 1.13*  --  1.49*  CALCIUM 8.7  --  9.0  GLUCOSE 129*  --  62*    CBG (last 3)  No results found for this basename: GLUCAP,  in the last 72 hours  Scheduled Meds: .  ceFAZolin (ANCEF) IV  2 g Intravenous On Call  . chlorhexidine  60 mL Topical Once  . citalopram  10 mg Oral Daily  .  furosemide  40 mg Oral Q breakfast  . gentamicin irrigation  80 mg Irrigation On Call  . insulin glargine  18 Units Subcutaneous QHS  . magnesium oxide  400 mg Oral BID  . metFORMIN  500 mg Oral BID WC  . rOPINIRole  0.5 mg Oral QHS  . simvastatin  20 mg Oral q1800  . traZODone  50 mg Oral QHS    Continuous Infusions: . sodium chloride 50 mL/hr at 09/19/12 8119    Past Medical History  Diagnosis Date  . Hypertension   . Hyperlipidemia   . Type 2 diabetes mellitus   . Arthritis   . Coronary artery disease     a. Mild nonobstructive by cath 04/2012, cath complicated by groin hematoma.  . S/P mitral valve repair 05/20/2012    a. Severe MR - 04/2012: 26mm Sorin Memo 3D ring annuloplasty via right mini thoracotomy approach  . S/P Maze operation for atrial fibrillation 05/20/2012    a. 04/2012: Complete bi-atrial lesion set using cryothermy  . Atrial fibrillation     a. Diagnosed 04/2012 in the  setting of severe MR. b. s/p TEE/successful DCCV 06/06/12.  Marland Kitchen PEA (Pulseless electrical activity)     a. 04/2012 -> adm with afib, developed PEA, transferred to [facility] for prolonged hospitalization.   . Chronic diastolic CHF (congestive heart failure)     Past Surgical History  Procedure Laterality Date  . Abdominal hysterectomy    . Cholecystectomy    . Eye surgery    . Appendectomy    . Tee without cardioversion N/A 05/09/2012    Procedure: TRANSESOPHAGEAL ECHOCARDIOGRAM (TEE);  Surgeon: Dolores Patty, MD;  Location: [facility] ENDOSCOPY;  Service: Cardiovascular;  Laterality: N/A;  . Mitral valve repair Right 05/20/2012    Procedure: MINIMALLY INVASIVE MITRAL VALVE REPAIR (MVR);  Surgeon: Purcell Nails, MD;  Location: [facility] OR;  Service: Open Heart Surgery;  Laterality: Right;  . Minimally invasive maze procedure N/A 05/20/2012    Procedure: MINIMALLY INVASIVE MAZE PROCEDURE;  Surgeon: Purcell Nails, MD;  Location: [facility] OR;  Service: Open Heart Surgery;  Laterality: N/A;  . Intraoperative transesophageal echocardiogram N/A 05/20/2012    Procedure: INTRAOPERATIVE TRANSESOPHAGEAL ECHOCARDIOGRAM;  Surgeon: Purcell Nails, MD;  Location: [facility] OR;  Service: Open Heart Surgery;  Laterality: N/A;  . Tee without cardioversion N/A 06/06/2012    Procedure: TRANSESOPHAGEAL ECHOCARDIOGRAM (TEE);  Surgeon: Dolores Patty, MD;  Location: [facility] ENDOSCOPY;  Service: Cardiovascular;  Laterality: N/A;  . Cardioversion N/A 06/06/2012    Procedure: CARDIOVERSION;  Surgeon: Dolores Patty, MD;  Location: [facility] ENDOSCOPY;  Service: Cardiovascular;  Laterality: N/A;    Clarene Duke RD, LDN Pager [phone] After Hours pager [phone]

## 2012-09-19 NOTE — Consult Note (Signed)
ELECTROPHYSIOLOGY CONSULT NOTE    Patient ID: [name] [name] MRN: [id number], DOB/AGE: 10/18/1924 79 y.o.  Admit date: 09/19/2012 Date of Consult: 09-19-2012  Primary Physician: Marylen Ponto, MD Primary Cardiologist: Nicholes Mango, MD  Reason for Consultation: junctional bradycardia and syncope  HPI:  [name] [name] is a 79 year old female whom we have been asked to see for junctional rhythm and syncope.   Her past medical history is notable for hypertension, hyperlipidemia, type II diabetes, nonobstructive CAD by cath in February of this year, severe MR, s/p MVR in 2/14 with MAZE at that time, and chronic diastolic heart failure.   In February of this year, she had a PEA arrest at an outside hospital, was transferred to [facility]. She was in afib with RVR following her arrest. She was found to have a normal EF and severe MR due to annular dilatation. She underwent minimally invasive mitral valve repair along with a MAZE at that time. She was discharged home on Amiodarone. In March of this year, she had recurrent atrial flutter and underwent cardioversion. In April of this year, she was found to be bradycardic and her Lopressor was discontinued.  Her previous admission 2 weeks ago was prompted by weakness and fatigue. She had been making good progress and plans were for discharge to SNF. She abruptly became bradycardic and hypotensive prior to discharge with symptoms of chest and left arm pressure. Her Coreg and Amiodarone were discontinued with improvement in her rates and she was discharged to SNF.  Since discharge on 6-18, she has been ambulating with a walker and has been getting stronger.  She has not noticed any shortness of breath, dizziness, or chest pain.  Yesterday, she was sitting in her wheelchair and had abrupt loss of consciousness.  She was transported to [facility] for further evaluation.  She has been found to have recurrent junctional rhythm with rates in the 40's. She has been off all AV  nodal agents since June 18th.   EP has been asked to evaluate for treatment options.  ROS is negative except as outlined above.    Past Medical History  Diagnosis Date  . Hypertension   . Hyperlipidemia   . Type 2 diabetes mellitus   . Arthritis   . Coronary artery disease     a. Mild nonobstructive by cath 04/2012, cath complicated by groin hematoma.  . S/P mitral valve repair 05/20/2012    a. Severe MR - 04/2012: 26mm Sorin Memo 3D ring annuloplasty via right mini thoracotomy approach  . S/P Maze operation for atrial fibrillation 05/20/2012    a. 04/2012: Complete bi-atrial lesion set using cryothermy  . Atrial fibrillation     a. Diagnosed 04/2012 in the setting of severe MR. b. s/p TEE/successful DCCV 06/06/12.  Marland Kitchen PEA (Pulseless electrical activity)     a. 04/2012 -> adm with afib, developed PEA, transferred to [facility] for prolonged hospitalization.   . Chronic diastolic CHF (congestive heart failure)      Surgical History:  Past Surgical History  Procedure Laterality Date  . Abdominal hysterectomy    . Cholecystectomy    . Eye surgery    . Appendectomy    . Tee without cardioversion N/A 05/09/2012    Procedure: TRANSESOPHAGEAL ECHOCARDIOGRAM (TEE);  Surgeon: Dolores Patty, MD;  Location: [facility] ENDOSCOPY;  Service: Cardiovascular;  Laterality: N/A;  . Mitral valve repair Right 05/20/2012    Procedure: MINIMALLY INVASIVE MITRAL VALVE REPAIR (MVR);  Surgeon: Purcell Nails, MD;  Location:  [facility] OR;  Service: Open Heart Surgery;  Laterality: Right;  . Minimally invasive maze procedure N/A 05/20/2012    Procedure: MINIMALLY INVASIVE MAZE PROCEDURE;  Surgeon: Purcell Nails, MD;  Location: [facility] OR;  Service: Open Heart Surgery;  Laterality: N/A;  . Intraoperative transesophageal echocardiogram N/A 05/20/2012    Procedure: INTRAOPERATIVE TRANSESOPHAGEAL ECHOCARDIOGRAM;  Surgeon: Purcell Nails, MD;  Location: [facility] OR;  Service: Open Heart Surgery;  Laterality: N/A;  . Tee without cardioversion  N/A 06/06/2012    Procedure: TRANSESOPHAGEAL ECHOCARDIOGRAM (TEE);  Surgeon: Dolores Patty, MD;  Location: [facility] ENDOSCOPY;  Service: Cardiovascular;  Laterality: N/A;  . Cardioversion N/A 06/06/2012    Procedure: CARDIOVERSION;  Surgeon: Dolores Patty, MD;  Location: [facility] ENDOSCOPY;  Service: Cardiovascular;  Laterality: N/A;     Prescriptions prior to admission  Medication Sig Dispense Refill  . citalopram (CELEXA) 10 MG tablet Take 10 mg by mouth daily.      . furosemide (LASIX) 40 MG tablet Take 1 tablet (40 mg total) by mouth daily.  30 tablet    . glimepiride (AMARYL) 1 MG tablet Take 1 mg by mouth daily before breakfast.      . insulin glargine (LANTUS) 100 UNIT/ML injection Inject 18 Units into the skin at bedtime.      . magnesium oxide (MAG-OX) 400 MG tablet Take 1 tablet (400 mg total) by mouth 2 (two) times daily.  60 tablet  6  . meclizine (ANTIVERT) 25 MG tablet Take 25 mg by mouth daily as needed for dizziness.      . metFORMIN (GLUCOPHAGE) 500 MG tablet Take 500 mg by mouth 2 (two) times daily with a meal.      . pravastatin (PRAVACHOL) 40 MG tablet Take 40 mg by mouth at bedtime.      Marland Kitchen rOPINIRole (REQUIP) 0.5 MG tablet Take 0.5 mg by mouth at bedtime.      . traMADol (ULTRAM) 50 MG tablet Take 1-2 tablets (50-100 mg total) by mouth every 4 (four) hours as needed for pain.  10 tablet  0  . traZODone (DESYREL) 50 MG tablet Take 50 mg by mouth at bedtime.      Marland Kitchen warfarin (COUMADIN) 2 MG tablet Take 1-2 mg by mouth daily. Sat & Sun only-0.5 half tab (1 mg total); All other days 1 tab (2 mg total)        Inpatient Medications:  . citalopram  10 mg Oral Daily  . furosemide  40 mg Oral Q breakfast  . insulin glargine  18 Units Subcutaneous QHS  . magnesium oxide  400 mg Oral BID  . metFORMIN  500 mg Oral BID WC  . rOPINIRole  0.5 mg Oral QHS  . simvastatin  20 mg Oral q1800  . traZODone  50 mg Oral QHS    Allergies:  Allergies  Allergen Reactions  . Codeine Nausea  And Vomiting    History   Social History  . Marital Status: Widowed    Spouse Name: N/A    Number of Children: 2  . Years of Education: N/A   Occupational History  . Not on file.   Social History Main Topics  . Smoking status: Never Smoker   . Smokeless tobacco: Never Used  . Alcohol Use: No  . Drug Use: No  . Sexually Active: Not on file   Other Topics Concern  . Not on file   Social History Narrative   Lives at home alone.  Family History  Problem Relation Age of Onset  . CAD Father 25    Died of MI  . CAD Brother 14    Died of MI  . CAD Brother 67    Died of MI  . CAD Mother 3    Died of MI    Labs:   Lab Results  Component Value Date   WBC 5.9 09/19/2012   HGB 10.1* 09/19/2012   HCT 30.9* 09/19/2012   MCV 91.7 09/19/2012   PLT 251 09/19/2012    Recent Labs Lab 09/19/12 0445  NA 137  K 3.8  CL 100  CO2 30  BUN 30*  CREATININE 1.49*  CALCIUM 9.0  GLUCOSE 62*   Lab Results  Component Value Date   DDIMER 0.32 09/06/2012     Radiology/Studies:Dg Chest 2 View 09/12/2012   *RADIOLOGY REPORT*  Clinical Data: Right pleural effusion. Cough.  CHEST - 2 VIEW  Comparison: Chest x-rays dated 09/11/2012 and 09/05/2012  Findings:  Central venous catheter tip is in good position in the superior vena cava.  Heart size and vascularity are normal.  Slight decrease in the right base atelectasis and effusion.  There is also a tiny left effusion.  No pneumothorax.  No acute osseous abnormality.  IMPRESSION: Slight decrease in right base atelectasis and effusion.  Small left effusion.   Original Report Authenticated By: Francene Boyers, M.D.   ZOX:[id number] rhythm, rate 46  TELEMETRY: junctional rhythm, rates 40's, now SR in the 70's Physical Exam: Filed Vitals:   09/19/12 0700 09/19/12 0730 09/19/12 0800 09/19/12 0900  BP: 121/26 127/25 132/42 119/28  Pulse: 36 39 42 39  Temp:   97.6 F (36.4 C)   TempSrc:   Oral   Resp: 17 21 20 22   Height:      Weight:       SpO2: 97% 99% 97% 98%    GEN- The patient is well appearing, alert and oriented x 3 today.   Head- normocephalic, atraumatic Eyes-  Sclera clear, conjunctiva pink Ears- hearing intact Oropharynx- clear Neck- supple  Lungs- Clear to ausculation bilaterally, normal work of breathing Heart- RRR GI- soft, NT, ND, + BS Extremities- no clubbing, cyanosis, or edema MS- age appropriate muscle atrophy Skin- no rash or lesion Psych- euthymic mood, full affect Neuro- strength and sensation are intact  Assessment and Plan:  1. The patient has symptomatic bradycardia/ sick sinus syndrome.  I would therefore recommend pacemaker implantation at this time.  Risks, benefits, alternatives to pacemaker implantation were discussed in detail with the patient today. The patient understands that the risks include but are not limited to bleeding, infection, pneumothorax, perforation, tamponade, vascular damage, renal failure, MI, stroke, death,  and lead dislodgement and wishes to proceed. We will therefore proceed.  2. afib Will continue anticoagulation long term and monitor Pacemaker histograms will help determine necessary rate control long term  3. Valvular heart disease Stable No change required today

## 2012-09-19 NOTE — H&P (Signed)
History and Physical   Patient ID: [name] [name] MRN: [id number], DOB/AGE: 1924-06-19   Admit date: 09/19/2012 Date of Consult: 09/19/2012   Primary Physician: Marylen Ponto, MD Primary Cardiologist: Dr. Barnab[facility]  HPI: [name] [name] is a 79 y.o. female with PMHx of PAF on warfarin, moderate to severe Pulm HTN (PASP=68 mmHg on past Echo), MR s/p MV repair with 26mm Sorin Memo 3D ring for annuloplasty and MAZE procedure on 05/20/12; discharged 05/27/12 after initially presenting as PEA arrest in early February 2014 and subsequently found to have AF with RVR, preserved LVEF and severe MR.  She received TEE+DCCV on 06/06/12 for persistent AFL.  She had a recent hospitalization here at [facility] 09/05/12-09/14/12 for UTI, hypoxia, FTT and pleural effusion s/p thoracentesis with transudative findings.  At that time her BB and amiodarone were discontinued by Dr. Mayford Knife with plans for patient to follow-up with Dr. Gala Romney for future considerations if bradycardia became a problem in the future.  Patient was in her baseline state of health at SNF when yesterday (09/18/12) she was sitting in chair and had sudden LOC, awoke on the floor, picked up by SNF staff.  She sustained a superficial left knee laceration and also c/o posterior scalp pain but had no lacerations.  Below is a summary of OSH records from ED visit prior to transfer to [facility]:  [facility] Records: Patient with recent UTI and hosp at [facility] Placed in [facility] on Sunday Today patient was sitting in chair and had sudden LOC, awoke on the floor, picked up by SNF staff Had superficial left knee laceration and also c/o posterior scalp pain but had no lacerations CT Head was negative at OSH CT C-spine was negative for fx at OSH Possible LUL opacity representing pneumonia or mass seen on C-spine CT XR right ankle was negative. XR chest was unable to visualize any LUL issue ECG's at OSH with SR at 74 bpm and QTc=468msec with nonspecific T-wave  changes Another ECG at OSH with bradycardic AFib at 39 bpm vs. JB, QTc=459 msec OSH Labs: Hb=10.8, PLT=249, INR=4.1, ABG=7.47/40/50/29.1/21% RA Tn <0.01, CPK=28  Creat=1.4, BUN=30, K=4.0, Na=137, LFT's wnl    Problem List: Past Medical History  Diagnosis Date  . Hypertension   . Hyperlipidemia   . Type 2 diabetes mellitus   . Arthritis   . Coronary artery disease     a. Mild nonobstructive by cath 04/2012, cath complicated by groin hematoma.  . S/P mitral valve repair 05/20/2012    a. Severe MR - 04/2012: 26mm Sorin Memo 3D ring annuloplasty via right mini thoracotomy approach  . S/P Maze operation for atrial fibrillation 05/20/2012    a. 04/2012: Complete bi-atrial lesion set using cryothermy  . Atrial fibrillation     a. Diagnosed 04/2012 in the setting of severe MR. b. s/p TEE/successful DCCV 06/06/12.  Marland Kitchen PEA (Pulseless electrical activity)     a. 04/2012 -> adm with afib, developed PEA, transferred to [facility] for prolonged hospitalization.   . Chronic diastolic CHF (congestive heart failure)     Past Surgical History  Procedure Laterality Date  . Abdominal hysterectomy    . Cholecystectomy    . Eye surgery    . Appendectomy    . Tee without cardioversion N/A 05/09/2012    Procedure: TRANSESOPHAGEAL ECHOCARDIOGRAM (TEE);  Surgeon: Dolores Patty, MD;  Location: [facility] ENDOSCOPY;  Service: Cardiovascular;  Laterality: N/A;  . Mitral valve repair Right 05/20/2012    Procedure: MINIMALLY INVASIVE MITRAL VALVE REPAIR (  MVR);  Surgeon: Purcell Nails, MD;  Location: [facility] OR;  Service: Open Heart Surgery;  Laterality: Right;  . Minimally invasive maze procedure N/A 05/20/2012    Procedure: MINIMALLY INVASIVE MAZE PROCEDURE;  Surgeon: Purcell Nails, MD;  Location: [facility] OR;  Service: Open Heart Surgery;  Laterality: N/A;  . Intraoperative transesophageal echocardiogram N/A 05/20/2012    Procedure: INTRAOPERATIVE TRANSESOPHAGEAL ECHOCARDIOGRAM;  Surgeon: Purcell Nails, MD;  Location: [facility] OR;   Service: Open Heart Surgery;  Laterality: N/A;  . Tee without cardioversion N/A 06/06/2012    Procedure: TRANSESOPHAGEAL ECHOCARDIOGRAM (TEE);  Surgeon: Dolores Patty, MD;  Location: [facility] ENDOSCOPY;  Service: Cardiovascular;  Laterality: N/A;  . Cardioversion N/A 06/06/2012    Procedure: CARDIOVERSION;  Surgeon: Dolores Patty, MD;  Location: [facility] ENDOSCOPY;  Service: Cardiovascular;  Laterality: N/A;     Allergies:  Allergies  Allergen Reactions  . Codeine Nausea And Vomiting    Home Medications: Prior to Admission medications   Medication Sig Start Date End Date Taking? Authorizing Provider  citalopram (CELEXA) 10 MG tablet Take 10 mg by mouth daily.   Yes Historical Provider, MD  furosemide (LASIX) 40 MG tablet Take 1 tablet (40 mg total) by mouth daily. 09/14/12  Yes Leroy Sea, MD  glimepiride (AMARYL) 1 MG tablet Take 1 mg by mouth daily before breakfast.   Yes Historical Provider, MD  insulin glargine (LANTUS) 100 UNIT/ML injection Inject 18 Units into the skin at bedtime.   Yes Historical Provider, MD  magnesium oxide (MAG-OX) 400 MG tablet Take 1 tablet (400 mg total) by mouth 2 (two) times daily. 07/25/12  Yes Dolores Patty, MD  meclizine (ANTIVERT) 25 MG tablet Take 25 mg by mouth daily as needed for dizziness.   Yes Historical Provider, MD  metFORMIN (GLUCOPHAGE) 500 MG tablet Take 500 mg by mouth 2 (two) times daily with a meal.   Yes Historical Provider, MD  pravastatin (PRAVACHOL) 40 MG tablet Take 40 mg by mouth at bedtime. 07/25/12  Yes Bevelyn Buckles Bensimhon, MD  rOPINIRole (REQUIP) 0.5 MG tablet Take 0.5 mg by mouth at bedtime.   Yes Historical Provider, MD  traMADol (ULTRAM) 50 MG tablet Take 1-2 tablets (50-100 mg total) by mouth every 4 (four) hours as needed for pain. 09/14/12  Yes Leroy Sea, MD  traZODone (DESYREL) 50 MG tablet Take 50 mg by mouth at bedtime.   Yes Historical Provider, MD  warfarin (COUMADIN) 2 MG tablet Take 1-2 mg by mouth daily. Sat &  Sun only-0.5 half tab (1 mg total); All other days 1 tab (2 mg total)   Yes Historical Provider, MD    Inpatient Medications:   Prescriptions prior to admission  Medication Sig Dispense Refill  . citalopram (CELEXA) 10 MG tablet Take 10 mg by mouth daily.      . furosemide (LASIX) 40 MG tablet Take 1 tablet (40 mg total) by mouth daily.  30 tablet    . glimepiride (AMARYL) 1 MG tablet Take 1 mg by mouth daily before breakfast.      . insulin glargine (LANTUS) 100 UNIT/ML injection Inject 18 Units into the skin at bedtime.      . magnesium oxide (MAG-OX) 400 MG tablet Take 1 tablet (400 mg total) by mouth 2 (two) times daily.  60 tablet  6  . meclizine (ANTIVERT) 25 MG tablet Take 25 mg by mouth daily as needed for dizziness.      . metFORMIN (GLUCOPHAGE) 500 MG tablet  Take 500 mg by mouth 2 (two) times daily with a meal.      . pravastatin (PRAVACHOL) 40 MG tablet Take 40 mg by mouth at bedtime.      Marland Kitchen rOPINIRole (REQUIP) 0.5 MG tablet Take 0.5 mg by mouth at bedtime.      . traMADol (ULTRAM) 50 MG tablet Take 1-2 tablets (50-100 mg total) by mouth every 4 (four) hours as needed for pain.  10 tablet  0  . traZODone (DESYREL) 50 MG tablet Take 50 mg by mouth at bedtime.      Marland Kitchen warfarin (COUMADIN) 2 MG tablet Take 1-2 mg by mouth daily. Sat & Sun only-0.5 half tab (1 mg total); All other days 1 tab (2 mg total)        Family History  Problem Relation Age of Onset  . CAD Father 73    Died of MI  . CAD Brother 35    Died of MI  . CAD Brother 31    Died of MI  . CAD Mother 46    Died of MI     History   Social History  . Marital Status: Widowed    Spouse Name: N/A    Number of Children: 2  . Years of Education: N/A   Occupational History  . Not on file.   Social History Main Topics  . Smoking status: Never Smoker   . Smokeless tobacco: Never Used  . Alcohol Use: No  . Drug Use: No  . Sexually Active: Not on file   Other Topics Concern  . Not on file   Social History  Narrative   Lives at home alone.      Review of Systems: All other systems reviewed and are otherwise negative except as noted above.  Physical Exam: Blood pressure 147/34, pulse 46, temperature 98.1 F (36.7 C), temperature source Oral, resp. rate 23, height 5\' 4"  (1.626 m), weight 64.8 kg (142 lb 13.7 oz), SpO2 92.00%.  General: Well developed, well nourished, in no acute distress. Head:  Normocephalic, atraumatic, sclera non-icteric, no xanthomas, nares are without discharge.  Neck: Negative for carotid bruits. JVD not elevated. Lungs: Clear bilaterally to auscultation without wheezes, rales, or rhonchi. Breathing is unlabored. Heart: Bradycardic rate and regular rhythm with S1 S2. No murmurs, rubs, or gallops appreciated. Abdomen: Soft, non-tender, non-distended with normoactive bowel sounds. No hepatomegaly. No rebound/guarding. No obvious abdominal masses. Msk:  Strength and tone appears normal for age. Extremities: No clubbing, cyanosis or edema.  Distal pedal pulses are 2+ and equal bilaterally. Neuro: Alert and oriented X 3. Moves all extremities spontaneously. Psych:  Responds to questions appropriately with a normal affect.  Labs: No results found for this basename: WBC, HGB, HCT, MCV, PLT,  in the last 72 hours No results found for this basename: VITAMINB12, FOLATE, FERRITIN, TIBC, IRON, RETICCTPCT,  in the last 72 hours No results found for this basename: DDIMER,  in the last 72 hours  Recent Labs Lab 09/12/12 0530 09/13/12 0450 09/14/12 0802  NA 137 136  --   K 3.7 3.3* 3.9  CL 100 100  --   CO2 26 30  --   BUN 24* 20  --   CREATININE 1.28* 1.13*  --   CALCIUM 8.6 8.7  --   GLUCOSE 92 129*  --    No results found for this basename: HGBA1C,  in the last 72 hours No results found for this basename: CKTOTAL, CKMB, CKMBINDEX, TROPONINI,  in  the last 72 hours No components found with this basename: POCBNP,  No results found for this basename: CHOL, HDL, LDLCALC,  TRIG, CHOLHDL, LDLDIRECT,  in the last 72 hours No results found for this basename: TSH, T4TOTAL, FREET3, T3FREE, THYROIDAB,  in the last 72 hours  Radiology/Studies: Dg Chest 1 View  09/08/2012   *RADIOLOGY REPORT*  Clinical Data: Status post right thoracentesis.  CHEST - 1 VIEW  Comparison: 09/05/2012.  Findings: The previously seen right pleural fluid is no longer demonstrated.  No pneumothorax.  Decreased linear density at the right lung base.  Increased linear and patchy density at the left lung base.  The cardiac silhouette remains borderline enlarged. The interstitial markings remain mildly prominent.  Bilateral shoulder degenerative changes and thoracic spine degenerative changes.  Bilateral subcoracoid bursa calcified loose bodies.  IMPRESSION:  1.  Resolved pleural fluid on the right with no pneumothorax following thoracentesis. 2.  Decreased right basilar atelectasis. 3.  Increased left basilar atelectasis and possible pneumonia. 4.  Stable mild chronic interstitial lung disease.   Original Report Authenticated By: Beckie Salts, M.D.   Dg Chest 2 View  09/12/2012   *RADIOLOGY REPORT*  Clinical Data: Right pleural effusion. Cough.  CHEST - 2 VIEW  Comparison: Chest x-rays dated 09/11/2012 and 09/05/2012  Findings:  Central venous catheter tip is in good position in the superior vena cava.  Heart size and vascularity are normal.  Slight decrease in the right base atelectasis and effusion.  There is also a tiny left effusion.  No pneumothorax.  No acute osseous abnormality.  IMPRESSION: Slight decrease in right base atelectasis and effusion.  Small left effusion.   Original Report Authenticated By: Francene Boyers, M.D.   Dg Chest 2 View  09/05/2012   *RADIOLOGY REPORT*  Clinical Data: fatigue  CHEST - 2 VIEW  Comparison: 09/01/2012  Findings: Borderline cardiomegaly.  Small to moderate right pleural effusion with right lower lobe atelectasis or infiltrate.  Small left pleural effusion with left  basilar atelectasis.  No pulmonary edema.  IMPRESSION: Small to moderate right pleural effusion with right lower lobe atelectasis or infiltrate.  Small left pleural effusion with left basilar atelectasis.  No pulmonary edema.   Original Report Authenticated By: Natasha Mead, M.D.   Dg Chest 2 View  09/01/2012   *RADIOLOGY REPORT*  Clinical Data: Mitral valve repair in February, follow-up, some shortness of breath  CHEST - 2 VIEW  Comparison: Portable chest x-ray of 07/01/2012  Findings: Pleuroparenchymal opacity remains at the right lung base most consistent with small right pleural effusion and right basilar atelectasis.  There is slight elevation of the right hemidiaphragm. The left lung is clear.  The heart is mildly enlarged and stable. Mitral valve replacement is noted.  There are degenerative changes throughout the thoracic spine and in both shoulders.  IMPRESSION: Opacity remains at the right lung base consistent with small right pleural effusion and right basilar atelectasis.   Original Report Authenticated By: Dwyane Dee, M.D.   Dg Chest Port 1 View  09/13/2012   *RADIOLOGY REPORT*  Clinical Data: Shortness of breath, pulmonary edema, pleural effusion  PORTABLE CHEST - 1 VIEW  Comparison: Portable exam 0624 hours compared to 09/12/2012  Findings: Right jugular central venous catheter tip projecting over SVC. Enlargement of cardiac silhouette with pulmonary vascular congestion. Mild perihilar infiltrates likely edema. Bibasilar effusions and atelectasis greater on the right. No pneumothorax. Bilateral glenohumeral degenerative changes and scattered thoracic spine endplate spur formation.  IMPRESSION: Minimal pulmonary edema. Bibasilar  effusions and atelectasis greater on the right.   Original Report Authenticated By: Ulyses Southward, M.D.   Dg Chest Port 1 View  09/11/2012   *RADIOLOGY REPORT*  Clinical Data: Cough and shortness of breath.  PORTABLE CHEST - 1 VIEW  Comparison: 09/10/2012.  Findings: The  right IJ catheter is stable.  The cardiac silhouette, mediastinal and hilar contours are unchanged.  There is a persistent right-sided pleural effusion and overlying atelectasis.  IMPRESSION: Persistent right pleural effusion and overlying atelectasis.   Original Report Authenticated By: Rudie Meyer, M.D.   Dg Chest Port 1 View  09/10/2012   *RADIOLOGY REPORT*  Clinical Data: Line placement.  PORTABLE CHEST - 1 VIEW  Comparison: Plain film the chest 09/10/2012 at 12:05  Findings: New right IJ catheter is in place with the tip projecting over the lower superior vena cava.  No pneumothorax is identified. Right greater than left effusions and airspace disease persist without change.  IMPRESSION: Right IJ catheter tip projects in good position.  No acute abnormality.   Original Report Authenticated By: Holley Dexter, M.D.   Dg Chest Port 1 View  09/10/2012   *RADIOLOGY REPORT*  Clinical Data: Chest pressure, hypertension, diabetes, coronary artery disease  PORTABLE CHEST - 1 VIEW  Comparison: Portable exam 1205 hours compared to 09/08/2012  Findings: Rotated to the right. Mild enlargement of cardiac silhouette. Mediastinal contours normal. Mild pulmonary vascular congestion. Peribronchial thickening with increased right basilar pleural effusion and atelectasis. Minimal left pleural effusion and basilar atelectasis versus infiltrate again seen. Upper lungs clear. Bones demineralized. Bilateral glenohumeral degenerative changes and thoracic spine endplate spur formation.  IMPRESSION: Enlargement of cardiac silhouette with pulmonary vascular congestion. Increased right basilar effusion and atelectasis. Persistent small left pleural effusion and atelectasis versus consolidation at left base. Mild bronchitic changes.   Original Report Authenticated By: Ulyses Southward, M.D.   US Thoracentesis Asp Pleural Space W/img Guide  09/08/2012   *RADIOLOGY REPORT*  Clinical Data:  Right pleural effusion  ULTRASOUND GUIDED  right THORACENTESIS  Comparison:  None  An ultrasound guided thoracentesis was thoroughly discussed with the patient and questions answered.  The benefits, risks, alternatives and complications were also discussed.  The patient understands and wishes to proceed with the procedure.  Written consent was obtained.  Ultrasound was performed to localize and mark an adequate pocket of fluid in the right chest.  The area was then prepped and draped in the normal sterile fashion.  1% Lidocaine was used for local anesthesia.  Under ultrasound guidance a 19 gauge Yueh catheter was introduced.  Thoracentesis was performed.  The catheter was removed and a dressing applied.  Complications:  None  Findings: A total of approximately 500 ml of yellow fluid was removed. A fluid sample was sent for laboratory analysis.  IMPRESSION: Successful ultrasound guided right thoracentesis yielding 500 ml of pleural fluid.  Read by: Ralene Muskrat, P.A.-C   Original Report Authenticated By: Malachy Moan, M.D.    [facility] 09/19/12 12-lead ECG:  Probable Junctional Bradycardia at 46 bpm.   [facility] 09/19/12 Review of Telemetry since arrival to CCU:  Shows periods of Junctional Bradycardia as well as what appears to be sinus bradycardia and strips that also show bradycardic AFib.  Possibly some of the telemetry strips demonstrate features of complete AVHB with narrow junctional escape but there is baseline artifact thus cannot be certain.  ASSESSMENT:  [name] [name] is a pleasant 79 yo WF with PMHx of PAF on warfarin, moderate to severe Pulm HTN (PASP=68  mmHg on past Echo), MR s/p MV repair with 26mm Sorin Memo 3D ring for annuloplasty and MAZE procedure on 05/20/12; discharged 05/27/12 after initially presenting as PEA arrest in early February 2014 and subsequently found to have AF with RVR, preserved LVEF and severe MR.  She received TEE+DCCV on 06/06/12 for persistent AFL.  She had a recent hospitalization here at [facility] 09/05/12-09/14/12 for UTI, hypoxia,  FTT and pleural effusion s/p thoracentesis with transudative findings.  Patient now presents with syncope at SNF and found to have ongoing issues with significant JB/bradycardic AFib/possible complete AVHB with otherwise stable mentation and hemodynamics.  PLAN:  1-Accepted in transfer from [facility] to [facility] CCU, service of Dr. Bensimhon/CHF team. 2-Syncope with Bradyarrhythmia:  Likely due to JB+episodes of bradycardic AFib+possible transient episodes of complete AVHB though it is difficult to determine given baseline artifact.  Currently patient is in what appears to be JB.  She is otherwise well perfused, normotensive, mentating well and fully alert.  Given that she is significantly bradycardic as aforementioned with HR's as low as 26 bpm OFF her previous rate controlling medicines, it seems that dual chamber PPM ought to be considered.  Primary team to evaluate and consult with EP in AM if agreeable.  Avoid all AV nodal agents in the interim.  Consider DOPAmine if significant bradycardia with pauses leading to symptoms or hypotension/altered mentation but at this time dopamine is not clinically indicated.   3-Syncope and fall:  No significant trauma; syncope likely secondary to bradyarrhythmia cited above.   4-Hypoxemia and outside hospital findings of possible LUL infiltrate seen on CT C-spine slices:  No subjective/objective clinical history or symptoms of pneumonia.  Unclear as to why patient's PaO2 was ~50.0 on OSH ABG, will repeat to re-evaluate.  Will hold off on starting IV ABx as the diagnosis of PNA is unlikely. 5-Avoid QT prolonging medications. 6-Supratherapeutic INR:  Holding warfarin.  Check daily PT/INR. 7-DVT PPx not indicated as patient is supratherapeutic on her systemic anticoagulation.  Signed, Christie Nottingham, MD Cardiology Fellow covering [facility]/CHF Team   09/19/2012, 3:34 AM

## 2012-09-19 NOTE — Op Note (Signed)
SURGEON:  Hillis Range, MD     PREPROCEDURE DIAGNOSIS:  Symptomatic Bradycardia, syncope    POSTPROCEDURE DIAGNOSIS:  Symptomatic Bradycardia, syncope     PROCEDURES:   1.   Pacemaker implantation.     INTRODUCTION: [name] [name] is a 78 y.o. female  with a history of symptomatic bradycardia who presents today for pacemaker implantation.  The patient reports episodes of recurrent syncope.  This was initially due to bradycardia.  Her AV nodal agents have been discontinued for at least 2 weeks.  She now presents with recurrent syncope and is found to have symptomatic bradycardia with junctional rhythm.  No reversible causes have been identified.  The patient therefore presents today for pacemaker implantation.     DESCRIPTION OF PROCEDURE:  Informed written consent was obtained, and the patient was brought to the electrophysiology lab in a fasting state.  The patient required no sedation for the procedure today.  The patients left chest was prepped and draped in the usual sterile fashion by the EP lab staff. The skin overlying the left deltopectoral region was infiltrated with lidocaine for local analgesia.  A 4-cm incision was made over the left deltopectoral region.  A left subcutaneous pacemaker pocket was fashioned using a combination of sharp and blunt dissection. Electrocautery was required to assure hemostasis.     RA/RV Lead Placement: The left axillary vein was cannulated.  Through the left axillary vein, a Medtronic model [id number] (serial number [id number]) right atrial lead and a Medtronic model 5076- 58 (serial number [id number]) right ventricular lead were advanced with fluoroscopic visualization into the right atrial appendage and right ventricular apex positions respectively.  Initial atrial lead P- waves measured 1.2 mV with impedance of 690 ohms and a threshold of 1.3 V at 0.5 msec.  Right ventricular lead R-waves measured 16 mV with an impedance of 724 ohms and a threshold of 1.3 V at  0.5 msec.  Both leads were secured to the pectoralis fascia using #2-0 silk over the suture sleeves.   Device Placement: The leads were then connected to a Medtronic Adapta L model ADDRL 1 (serial number [id number]) pacemaker.  The pocket was irrigated with copious gentamicin solution.  The pacemaker was then placed into the pocket.  The pocket was then closed in 2 layers with 2.0 Vicryl suture for the subcutaneous and subcuticular layers.  Steri- Strips and a sterile dressing were then applied.  There were no early apparent complications. No contrast was required for the procedure today.    CONCLUSIONS:   1. Successful implantation of a Medtronic Adapta L dual-chamber pacemaker for symptomatic bradycardia and syncope  2. No early apparent complications.           Hillis Range, MD 09/19/2012 1:55 PM

## 2012-09-19 NOTE — Progress Notes (Signed)
Patient arrived to Cath lab holding and cbg obtained resulted in 66. Dr. Johney Frame was informed and gave an order for half an amp of D50. Patients cbg rechecked 15 minutes later and resulted in 109 MD made aware no new orders given at this time.

## 2012-09-19 NOTE — Care Management Note (Addendum)
    Page 1 of 1   09/22/2012     4:13:20 PM   CARE MANAGEMENT NOTE 09/22/2012  Patient:  [name] [name], [name] [name]   Account Number:  [id number]  Date Initiated:  09/19/2012  Documentation initiated by:  Junius Creamer  Subjective/Objective Assessment:   adm w brady and syncope     Action/Plan:   from [facility]   Anticipated DC Date:  09/22/2012   Anticipated DC Plan:  SKILLED NURSING FACILITY  In-house referral  Clinical Social Worker      DC Planning Services  CM consult      Choice offered to / List presented to:             Status of service:  Completed, signed off Medicare Important Message given?   (If response is "NO", the following Medicare IM given date fields will be blank) Date Medicare IM given:   Date Additional Medicare IM given:    Discharge Disposition:  SKILLED NURSING FACILITY  Per UR Regulation:  Reviewed for med. necessity/level of care/duration of stay  If discussed at Long Length of Stay Meetings, dates discussed:    Comments:  09/22/12 Lue Sykora,RN,BSN [phone] PT DISCHARGED BACK TO [facility] TODAY, PER CSW ARRANGEMENTS.

## 2012-09-19 NOTE — Progress Notes (Signed)
Inpatient Diabetes Program Recommendations  AACE/ADA: New Consensus Statement on Inpatient Glycemic Control (2013)  Target Ranges:  Prepandial:   less than 140 mg/dL      Peak postprandial:   less than 180 mg/dL (1-2 hours)      Critically ill patients:  140 - 180 mg/dL  Results for [name] [name], [name] [name] (MRN [id number]) as of 09/19/2012 13:03  Ref. Range 09/19/2012 04:45  Glucose Latest Range: 70-99 mg/dL 62 (L)   Results for [name] [name], [name] [name] (MRN [id number]) as of 09/19/2012 13:03  Ref. Range 09/19/2012 12:02  Glucose-Capillary Latest Range: 70-99 mg/dL 66 (L)   Inpatient Diabetes Program Recommendations Insulin - Basal: Discontinue Lantus  Correction (SSI): Start Sensitive scale if needed Oral Agents: HOLD all oral antihyperglycemic meds during hospitalization Thank you  Piedad Climes BSN, RN,CDE Inpatient Diabetes Coordinator [phone] (team pager)

## 2012-09-20 ENCOUNTER — Inpatient Hospital Stay (HOSPITAL_COMMUNITY): Payer: Medicare Other

## 2012-09-20 DIAGNOSIS — I498 Other specified cardiac arrhythmias: Principal | ICD-10-CM

## 2012-09-20 DIAGNOSIS — I4891 Unspecified atrial fibrillation: Secondary | ICD-10-CM

## 2012-09-20 LAB — GLUCOSE, CAPILLARY: Glucose-Capillary: 72 mg/dL (ref 70–99)

## 2012-09-20 LAB — BASIC METABOLIC PANEL
BUN: 25 mg/dL — ABNORMAL HIGH (ref 6–23)
CO2: 27 mEq/L (ref 19–32)
Chloride: 105 mEq/L (ref 96–112)
Creatinine, Ser: 1.17 mg/dL — ABNORMAL HIGH (ref 0.50–1.10)
Glucose, Bld: 85 mg/dL (ref 70–99)

## 2012-09-20 LAB — CBC
HCT: 29.3 % — ABNORMAL LOW (ref 36.0–46.0)
MCH: 30.5 pg (ref 26.0–34.0)
MCV: 92.1 fL (ref 78.0–100.0)
RDW: 14.3 % (ref 11.5–15.5)
WBC: 5.3 10*3/uL (ref 4.0–10.5)

## 2012-09-20 NOTE — Progress Notes (Signed)
Pacemaker precautions.

## 2012-09-20 NOTE — Clinical Social Work Psychosocial (Signed)
Clinical Social Work Department  BRIEF PSYCHOSOCIAL ASSESSMENT  09/20/2012  Patient: [name] [name], [name] [name] Account Number: [id number] Admit date: 09/05/2012  Clinical Social Worker: Sabino Niemann, MSWDate/Time: 09/20/2012 3:45 PM  Referred by: Physician Date Referred: 09/19/12  Referred for   SNF Placement   Other Referral:  Interview type: Patient  Other interview type:  PSYCHOSOCIAL DATA  Living Status: ALONE  Admitted from facility: [facility] in [place] Level of care: SNF Primary support name: Zeien,Michael  Primary support relationship to patient: son Degree of support available:  Strong and vested  CURRENT CONCERNS  Current Concerns   Post-Acute Placement   Other Concerns:  SOCIAL WORK ASSESSMENT / PLAN  Met with patient who  Was at [facility] for a short time and had to come back for surgery. Patient was very delightful and is agreeable to returning to [facility] [place]. Patient reported they are holding her bed at this time.  Assessment/plan status: Other - See comment  Other assessment/ plan:  FL2 and [place] for completion for ?SNF   Information/referral to community resources:  SNF   EMS      PATIENT'S/FAMILY'S RESPONSE TO PLAN OF CARE:  Patient agreeable to returning back to [facility]. Patient appreciative of CSW information and support  Sabino Niemann, MSW [phone]

## 2012-09-20 NOTE — Progress Notes (Signed)
Patient Name: [name] [name]      SUBJECTIVE:without chest pain, sorenss at shoulder  Past Medical History  Diagnosis Date  . Hypertension   . Hyperlipidemia   . Type 2 diabetes mellitus   . Arthritis   . Coronary artery disease     a. Mild nonobstructive by cath 04/2012, cath complicated by groin hematoma.  . S/P mitral valve repair 05/20/2012    a. Severe MR - 04/2012: 26mm Sorin Memo 3D ring annuloplasty via right mini thoracotomy approach  . S/P Maze operation for atrial fibrillation 05/20/2012    a. 04/2012: Complete bi-atrial lesion set using cryothermy  . Atrial fibrillation     a. Diagnosed 04/2012 in the setting of severe MR. b. s/p TEE/successful DCCV 06/06/12.  Marland Kitchen PEA (Pulseless electrical activity)     a. 04/2012 -> adm with afib, developed PEA, transferred to [facility] for prolonged hospitalization.   . Chronic diastolic CHF (congestive heart failure)     Scheduled Meds:  Scheduled Meds: . citalopram  10 mg Oral Daily  . furosemide  40 mg Oral Q breakfast  . insulin glargine  18 Units Subcutaneous QHS  . magnesium oxide  400 mg Oral BID  . metFORMIN  500 mg Oral BID WC  . rOPINIRole  0.5 mg Oral QHS  . simvastatin  20 mg Oral q1800  . sodium chloride  3 mL Intravenous Q12H  . traZODone  50 mg Oral QHS   Continuous Infusions:   PHYSICAL EXAM Filed Vitals:   09/19/12 1100 09/19/12 1704 09/19/12 1941 09/20/12 0625  BP: 128/39 135/45 124/56 134/61  Pulse: 69 71 77 74  Temp:  98.2 F (36.8 C)  98.6 F (37 C)  TempSrc:  Oral Oral Oral  Resp: 23 20 18 20   Height:      Weight:    144 lb 8 oz (65.545 kg)  SpO2: 96% 94% 90% 93%     TELEMETRY: Reviewed telemetry pt in NSR :  Well developed and nourished in no acute distress HENT normal Neck supple with JVP-flat Pocket hematoma Clear Regular rate and rhythm, no murmurs or gallops Abd-soft with active BS No Clubbing cyanosis edema Skin-warm and dry A & Oriented  Grossly normal sensory and motor  function   Intake/Output Summary (Last 24 hours) at 09/20/12 1108 Last data filed at 09/20/12 4098  Gross per 24 hour  Intake    170 ml  Output    400 ml  Net   -230 ml    LABS: Basic Metabolic Panel:  Recent Labs Lab 09/14/12 0802 09/19/12 0445 09/20/12 0445  NA  --  137 139  K 3.9 3.8 3.8  CL  --  100 105  CO2  --  30 27  GLUCOSE  --  62* 85  BUN  --  30* 25*  CREATININE  --  1.49* 1.17*  CALCIUM  --  9.0 8.4   Cardiac Enzymes: No results found for this basename: CKTOTAL, CKMB, CKMBINDEX, TROPONINI,  in the last 72 hours CBC:  Recent Labs Lab 09/19/12 0445 09/20/12 0445  WBC 5.9 5.3  HGB 10.1* 9.7*  HCT 30.9* 29.3*  MCV 91.7 92.1  PLT 251 243   PROTIME:  Recent Labs  09/19/12 0445 09/20/12 0445  LABPROT 27.3* 25.2*  INR 2.64* 2.38*   Liver Function Tests: No results found for this basename: AST, ALT, ALKPHOS, BILITOT, PROT, ALBUMIN,  in the last 72 hours No results found for this basename: LIPASE, AMYLASE,  in the last  72 hours BNP: BNP (last 3 results)  Recent Labs  05/06/12 0229 05/13/12 0535 09/06/12 0302  PROBNP 3044.0* 1125.0* 3823.0*   D-Dimer: No results found for this basename: DDIMER,  in the last 72 hours   Device Interrogation normal device fucntion  ASSESSMENT AND PLAN:  Active Problems:   Atrial fibrillation   S/P mitral valve repair   S/P Maze operation for atrial fibrillation   Symptomatic bradycardia  S.p pacer function Pocket hematoma  Pressure applied for 15 min and pressure dressing applied Total time 40 min  Signed, Sherryl Manges MD  09/20/2012

## 2012-09-21 LAB — BASIC METABOLIC PANEL
BUN: 24 mg/dL — ABNORMAL HIGH (ref 6–23)
GFR calc non Af Amer: 44 mL/min — ABNORMAL LOW (ref >90)
Glucose, Bld: 46 mg/dL — ABNORMAL LOW (ref 70–99)
Potassium: 3.2 mEq/L — ABNORMAL LOW (ref 3.5–5.1)

## 2012-09-21 LAB — GLUCOSE, CAPILLARY
Glucose-Capillary: 56 mg/dL — ABNORMAL LOW (ref 70–99)
Glucose-Capillary: 88 mg/dL (ref 70–99)
Glucose-Capillary: 99 mg/dL (ref 70–99)
Glucose-Capillary: 149 mg/dL — ABNORMAL HIGH (ref 70–99)
Glucose-Capillary: 116 mg/dL — ABNORMAL HIGH (ref 70–99)

## 2012-09-21 LAB — CBC
HCT: 29.3 % — ABNORMAL LOW (ref 36.0–46.0)
Hemoglobin: 9.7 g/dL — ABNORMAL LOW (ref 12.0–15.0)
MCH: 30.5 pg (ref 26.0–34.0)
MCHC: 33.1 g/dL (ref 30.0–36.0)
MCV: 92.1 fL (ref 78.0–100.0)

## 2012-09-21 LAB — PROTIME-INR: Prothrombin Time: 20.4 seconds — ABNORMAL HIGH (ref 11.6–15.2)

## 2012-09-21 NOTE — Progress Notes (Signed)
Patient Name: [name] [name]      SUBJECTIVE:without chest pain, sorenss at shoulder Feels a little better  Past Medical History  Diagnosis Date  . Hypertension   . Hyperlipidemia   . Type 2 diabetes mellitus   . Arthritis   . Coronary artery disease     a. Mild nonobstructive by cath 04/2012, cath complicated by groin hematoma.  . S/P mitral valve repair 05/20/2012    a. Severe MR - 04/2012: 26mm Sorin Memo 3D ring annuloplasty via right mini thoracotomy approach  . S/P Maze operation for atrial fibrillation 05/20/2012    a. 04/2012: Complete bi-atrial lesion set using cryothermy  . Atrial fibrillation     a. Diagnosed 04/2012 in the setting of severe MR. b. s/p TEE/successful DCCV 06/06/12.  Marland Kitchen PEA (Pulseless electrical activity)     a. 04/2012 -> adm with afib, developed PEA, transferred to [facility] for prolonged hospitalization.   . Chronic diastolic CHF (congestive heart failure)     Scheduled Meds:  Scheduled Meds: . citalopram  10 mg Oral Daily  . furosemide  40 mg Oral Q breakfast  . insulin glargine  18 Units Subcutaneous QHS  . magnesium oxide  400 mg Oral BID  . metFORMIN  500 mg Oral BID WC  . rOPINIRole  0.5 mg Oral QHS  . simvastatin  20 mg Oral q1800  . sodium chloride  3 mL Intravenous Q12H  . traZODone  50 mg Oral QHS   Continuous Infusions:   PHYSICAL EXAM Filed Vitals:   09/20/12 0625 09/20/12 1452 09/20/12 2121 09/21/12 0601  BP: 134/61 116/69 126/70 105/58  Pulse: 74 74 81 80  Temp: 98.6 F (37 C) 99.2 F (37.3 C) 98.8 F (37.1 C) 98.1 F (36.7 C)  TempSrc: Oral Oral Oral Oral  Resp: 20 19 18 17   Height:      Weight: 144 lb 8 oz (65.545 kg)   145 lb 8.1 oz (66 kg)  SpO2: 93%  92% 91%     TELEMETRY: Reviewed telemetry pt in NSR :  Well developed and nourished in no acute distress HENT normal Neck supple with JVP-flat Pocket hematoma under wraps Clear Regular rate and rhythm, no murmurs or gallops Abd-soft with active BS No Clubbing cyanosis  edema Skin-warm and dry A & Oriented  Grossly normal sensory and motor function   Intake/Output Summary (Last 24 hours) at 09/21/12 0850 Last data filed at 09/20/12 2300  Gross per 24 hour  Intake      0 ml  Output    300 ml  Net   -300 ml    LABS: Basic Metabolic Panel:  Recent Labs Lab 09/19/12 0445 09/20/12 0445 09/21/12 0553  NA 137 139 136  K 3.8 3.8 3.2*  CL 100 105 101  CO2 30 27 26   GLUCOSE 62* 85 46*  BUN 30* 25* 24*  CREATININE 1.49* 1.17* 1.11*  CALCIUM 9.0 8.4 8.7   Cardiac Enzymes: No results found for this basename: CKTOTAL, CKMB, CKMBINDEX, TROPONINI,  in the last 72 hours CBC:  Recent Labs Lab 09/19/12 0445 09/20/12 0445 09/21/12 0553  WBC 5.9 5.3 6.2  HGB 10.1* 9.7* 9.7*  HCT 30.9* 29.3* 29.3*  MCV 91.7 92.1 92.1  PLT 251 243 247   PROTIME:  Recent Labs  09/19/12 0445 09/20/12 0445 09/21/12 0553  LABPROT 27.3* 25.2* 20.4*  INR 2.64* 2.38* 1.80*   Liver Function Tests: No results found for this basename: AST, ALT, ALKPHOS, BILITOT, PROT, ALBUMIN,  in  the last 72 hours No results found for this basename: LIPASE, AMYLASE,  in the last 72 hours BNP: BNP (last 3 results)  Recent Labs  05/06/12 0229 05/13/12 0535 09/06/12 0302  PROBNP 3044.0* 1125.0* 3823.0*   D-Dimer: No results found for this basename: DDIMER,  in the last 72 hours   Device Interrogation normal device fucntion  ASSESSMENT AND PLAN:  Active Problems:   Atrial fibrillation   S/P mitral valve repair   S/P Maze operation for atrial fibrillation   Symptomatic bradycardia  S.p pacer function Pocket hematoma  Will observe overnight   Signed, Sherryl Manges MD  09/21/2012

## 2012-09-21 NOTE — Progress Notes (Signed)
Hypoglycemic Event  CBG: 43  Treatment: 4 oz orange juice    Symptoms: "a little sleepy"  Follow-up CBG: Time:7:05 CBG Result: 88  Possible Reasons for Event: "Did not eat dinner last night."  Comments/MD notified: No    Anne Ng  Remember to initiate Hypoglycemia Order Set & complete

## 2012-09-22 ENCOUNTER — Encounter (HOSPITAL_COMMUNITY): Payer: Self-pay | Admitting: Physician Assistant

## 2012-09-22 DIAGNOSIS — I509 Heart failure, unspecified: Secondary | ICD-10-CM

## 2012-09-22 LAB — BASIC METABOLIC PANEL
Calcium: 8.6 mg/dL (ref 8.4–10.5)
GFR calc Af Amer: 53 mL/min — ABNORMAL LOW (ref >90)
GFR calc non Af Amer: 46 mL/min — ABNORMAL LOW (ref >90)
Sodium: 134 mEq/L — ABNORMAL LOW (ref 135–145)

## 2012-09-22 LAB — CBC
Platelets: 226 10*3/uL (ref 150–400)
RBC: 2.95 MIL/uL — ABNORMAL LOW (ref 3.87–5.11)
RDW: 14.2 % (ref 11.5–15.5)
WBC: 6.1 10*3/uL (ref 4.0–10.5)

## 2012-09-22 LAB — GLUCOSE, CAPILLARY: Glucose-Capillary: 79 mg/dL (ref 70–99)

## 2012-09-22 LAB — PROTIME-INR: Prothrombin Time: 19.1 seconds — ABNORMAL HIGH (ref 11.6–15.2)

## 2012-09-22 MED ORDER — WARFARIN SODIUM 2 MG PO TABS: ORAL_TABLET | ORAL | Status: DC

## 2012-09-22 NOTE — Progress Notes (Signed)
Pt for discharge to [facility] [place].  CSW facilitated pt discharge needs including contacting the facility, faxing pt d/c information via [facility], discussing with pt family, providing RN phone number to call report, and pt family plans to transport via private vehicle. Discharge packet provided to RN to provide to family upon discharge.  No further social work needs identified at this time.  CSW signing off.   Jacklynn Lewis, MSW, LCSWA (coverage [phone])  Clinical Social Work

## 2012-09-22 NOTE — Progress Notes (Signed)
Ambulated with patient in hallway, gait steady, using rolling walker, tolerated well Egbert Garibaldi A

## 2012-09-22 NOTE — Discharge Summary (Signed)
Discharge Summary   [name] ID: [name] [name] MRN: [id number], DOB/AGE: 10-02-1924 79 y.o. Admit date: 09/19/2012 D/C date:     09/22/2012  Primary Cardiologist: Bensimhon  Primary Discharge Diagnoses:  1. Symptomatic bradycardia s/p pacemaker - s/p Medtronic pacemaker implantation 09/19/12 - complicated by pocket hematoma 2. Paroxysmal atrial fibrillation - history: diagnosed 04/2012 in [name] setting of severe MR, s/p MAZE operation 04/2012: Complete bi-atrial lesion set using cryothermy, s/p TEE/successful DCCV 06/06/12 - Coumadin on hold until Wed 09/24/12 3. Chronic diastolic CHF 4. Type 2 diabetes mellitus with hypoglycemia this admission 5. Pleural effusion (h/o thoracentesis last admission 08/2012) 6. Anemia, noted 08/2012  Secondary Discharge Diagnoses:  1. Severe MR s/p MVR 04/2012: 26mm Sorin Memo 3D ring annuloplasty via right mini thoracotomy approach  2. HTN 3. HLD 4. Mod-severe pulm HTN 5. Arthritis 6. CAD - Mild nonobstructive by cath 04/2012, cath complicated by groin hematoma.  7. H/o PEA 04/2012 -> adm with afib, developed PEA, transferred to [facility] for prolonged hospitalization 8. Failure to thrive 08/2012 9. Hypoxemia 08/2012  Hospital Course: [name] [name] is an 79 y/o F with history of PAF on warfarin, moderate to severe Pulm HTN (PASP=68 mmHg on past Echo), MR s/p MV repair with 26mm Sorin Memo 3D ring for annuloplasty and MAZE procedure on 05/20/12; discharged 05/27/12 after initially presenting as PEA arrest in early February 2014 and subsequently found to have AF with RVR, preserved LVEF and severe MR. She received TEE+DCCV on 06/06/12 for persistent AFL. She had a recent hospitalization here at [facility] 09/05/12-09/14/12 for UTI, hypoxia, FTT and pleural effusion s/p thoracentesis with transudative findings. At that time [name] [name] BB and amiodarone were discontinued by Dr. Mayford Knife with plans for [name] to follow-up with Dr. Gala Romney for future considerations if bradycardia became a problem in [name]  future. [name] [name] was in [name] [name] baseline state of health at SNF [name] day prior to admission when she was sitting in chair and had sudden LOC. She awoke on [name] floor and picked up by SNF staff. She sustained a superficial left knee laceration and also c/o posterior scalp pain but had no lacerations. She was initially taken to [facility] then transferred to [facility] overnight 09/19/12 for further evaluation. Per H/P, CT head was negative, CT c spine was negative, ankle xray was negative. There was a question of LUL opacity on CT c-spine but CXR did not visualize this. One EKG showed NSR 74bpm and another EKG showed afib at 39bpm vs JB.  [name] [name] syncopal spell was felt likely due to bradyarrhythmia. In [name] ER she had a HR as low as 26 (off previous rate controlling medication since June 18). BP was stable and she was still mentating well, thus did not initially require dopamine or transcutaneous pacing. Coumadin was held. Dr. Johney Frame with EP was consulted that morning and recommended pacemaker implantation. She underwent Medtronic Adapta L dual-chamber pacemaker implantation that day. Post-procedurally, she did develop a pocket hematoma. Pressure was appliedand pressure dressing was applied. She was observed in [name] hospital through today 09/22/12 to ensure stability of pocket hematoma. Dr. Johney Frame has recommended to hold Coumadin another 48 hours and wound check in our office in 2 days. We have scheduled this for Wednesday 09/24/12 at 2pm for pacer check and Coumadin clinic visit to follow immediately after to determine what dose she should start back on. Dr. Johney Frame has reviewed [name] [name] CXR from 6/28 - pleural effusion has accumulated, but he feels she remains clinically stable for discharge. He has  recommended [name] [name] to keep previously scheduled CHF appointments and appt with Dr. Cornelius Moras.  Of note, [name] [name] also had an episode of hypoglycemia while in [name] hospital. [name] [name] Amaryl was discontinued. CBG is controlled this AM on  Lantus/Metformin She is to discuss futher regimen with [name] [name] PCP/nursing home doctor. We would also ask that attention be made to [name] [name] Hgb upon followup given anemia.   Discharge Vitals: Blood pressure 116/65, pulse 71, temperature 97.7 F (36.5 C), temperature source Oral, resp. rate 19, height 5\' 4"  (1.626 m), weight 143 lb 4.8 oz (65 kg), SpO2 91.00%.  Labs: Lab Results  Component Value Date   WBC 6.1 09/22/2012   HGB 9.2* 09/22/2012   HCT 27.2* 09/22/2012   MCV 92.2 09/22/2012   PLT 226 09/22/2012     Recent Labs Lab 09/22/12 0440  NA 134*  K 3.7  CL 100  CO2 29  BUN 27*  CREATININE 1.07  CALCIUM 8.6  GLUCOSE 93    Lab Results  Component Value Date   DDIMER 0.32 09/06/2012    Diagnostic Studies/Procedures   Pacer implantation 09/19/12  Dg Chest 2 View 09/20/2012   *RADIOLOGY REPORT*  Clinical Data: Pacemaker insertion  CHEST - 2 VIEW  Comparison: 09/13/2012  Findings: Dual lead left subclavian pacemaker device and leads have been placed.  Leads are positioned in [name] right atrium and right ventricle.  Right pleural effusion has increased.  Dense opacity at [name] right base is worse with worsening shift of [name] mediastinum to [name] right.  Left lung remains relatively clear with a small stable left pleural effusion.  No evidence of pneumothorax.  IMPRESSION: Left subclavian pacemaker placement without evidence of pneumothorax.  Increasing airspace disease and volume loss at [name] right lung base with shift of mediastinum to [name] right.  Bilateral pleural effusions as described.   Original Report Authenticated By: Jolaine Click, M.D.   Discharge Medications     Medication List    STOP taking these medications       glimepiride 1 MG tablet  Commonly known as:  AMARYL      TAKE these medications       citalopram 10 MG tablet  Commonly known as:  CELEXA  Take 10 mg by mouth daily.     furosemide 40 MG tablet  Commonly known as:  LASIX  Take 1 tablet (40 mg total) by mouth daily.       insulin glargine 100 UNIT/ML injection  Commonly known as:  LANTUS  Inject 18 Units into [name] skin at bedtime.     magnesium oxide 400 MG tablet  Commonly known as:  MAG-OX  Take 1 tablet (400 mg total) by mouth 2 (two) times daily.     metFORMIN 500 MG tablet  Commonly known as:  GLUCOPHAGE  Take 500 mg by mouth 2 (two) times daily with a meal.     pravastatin 40 MG tablet  Commonly known as:  PRAVACHOL  Take 40 mg by mouth at bedtime.     rOPINIRole 0.5 MG tablet  Commonly known as:  REQUIP  Take 0.5 mg by mouth at bedtime.     traMADol 50 MG tablet  Commonly known as:  ULTRAM  Take 1-2 tablets (50-100 mg total) by mouth every 4 (four) hours as needed for pain.     traZODone 50 MG tablet  Commonly known as:  DESYREL  Take 50 mg by mouth at bedtime.     warfarin 2 MG tablet  Commonly known  as:  COUMADIN  Hold Coumadin for 48 hours - have your INR (Coumadin level) checked at [facility] when you go in for your pacemaker check on Wednesday 09/24/12 to determine what your starting dose should be.        Disposition   [name] [name] will be discharged in stable condition to home. Discharge Orders   Future Appointments Provider Department Dept Phone   09/24/2012 2:00 PM [facility] [address] Main Office [place]) [phone]   09/24/2012 2:30 PM [facility] [phone]   12/01/2012 1:30 PM Purcell Nails, MD [facility] [phone]   Future Orders Complete By Expires     Diet - low sodium heart healthy  As directed     Increase activity slowly  As directed     Scheduling Instructions:      Please see attached sheet at [name] end of your After-Visit Summary for instructions on wound care, activity, and bathing. Leave current dressing in place until wound check on Wednesday 09/24/12.      Follow-up Information   Follow up with [facility]. (09/24/12 at 11am)     Contact information:   [address] [place] [address] [phone]      Follow up with [facility]. (09/24/12 at 2pm - for pacemaker check and Coumadin Clinic visit)    Contact information:   [address] [place] [address] [phone]      Follow up with [facility]. (10/23/12 at 10:40am)    Contact information:   [address] [place] [address] [phone]      Follow up with Purcell Nails, MD. (12/01/12 at 1:30pm)    Contact information:   [address] [employer] [address] [place] [address] [phone]       Follow up with Marylen Ponto, MD. (Please discuss diabetes medicine regimen with primary care doctor/nursing home doctor. We stopped your glimepiride due to low blood sugar. You are also anemic and this needs to be followed.)    Contact information:   [address] [place] [address] [phone]         Duration of Discharge Encounter: Greater than 30 minutes including physician and PA time.  Signed, Kriste Basque Dunn PA-C 09/22/2012, 10:13 AM   Hillis Range, MD

## 2012-09-22 NOTE — Progress Notes (Addendum)
   SUBJECTIVE: The patient is doing well today.  At this time, she denies chest pain, shortness of breath, or any new concerns.  . citalopram  10 mg Oral Daily  . furosemide  40 mg Oral Q breakfast  . insulin glargine  18 Units Subcutaneous QHS  . magnesium oxide  400 mg Oral BID  . metFORMIN  500 mg Oral BID WC  . rOPINIRole  0.5 mg Oral QHS  . simvastatin  20 mg Oral q1800  . sodium chloride  3 mL Intravenous Q12H  . traZODone  50 mg Oral QHS      OBJECTIVE: Physical Exam: Filed Vitals:   09/21/12 0601 09/21/12 1400 09/21/12 2000 09/22/12 0425  BP: 105/58 120/68 134/69 116/65  Pulse: 80 76 80 71  Temp: 98.1 F (36.7 C) 98.4 F (36.9 C) 98.9 F (37.2 C) 97.7 F (36.5 C)  TempSrc: Oral Oral Oral Oral  Resp: 17 18 18 19   Height:      Weight: 145 lb 8.1 oz (66 kg)   143 lb 4.8 oz (65 kg)  SpO2: 91% 94% 94% 91%   No intake or output data in the 24 hours ending 09/22/12 0757  Telemetry reveals sinus rhythm  GEN- The patient is well appearing, alert and oriented x 3 today.   Head- normocephalic, atraumatic Eyes-  Sclera clear, conjunctiva pink Ears- hearing intact Oropharynx- clear Neck- supple, no JVP Lymph- no cervical lymphadenopathy Lungs- Clear to ausculation bilaterally, normal work of breathing Heart- Regular rate and rhythm, no murmurs, rubs or gallops, PMI not laterally displaced GI- soft, NT, ND, + BS Extremities- no clubbing, cyanosis, or edema Moderate but stable pacemaker pocket hematoma  LABS: Basic Metabolic Panel:  Recent Labs  16/10/96 0553 09/22/12 0440  NA 136 134*  K 3.2* 3.7  CL 101 100  CO2 26 29  GLUCOSE 46* 93  BUN 24* 27*  CREATININE 1.11* 1.07  CALCIUM 8.7 8.6   Liver Function Tests: No results found for this basename: AST, ALT, ALKPHOS, BILITOT, PROT, ALBUMIN,  in the last 72 hours No results found for this basename: LIPASE, AMYLASE,  in the last 72 hours CBC:  Recent Labs  09/21/12 0553 09/22/12 0440  WBC 6.2 6.1  HGB  9.7* 9.2*  HCT 29.3* 27.2*  MCV 92.1 92.2  PLT 247 226   ASSESSMENT AND PLAN:  Active Problems:   Atrial fibrillation   S/P mitral valve repair   S/P Maze operation for atrial fibrillation   Symptomatic bradycardia  1. Symptomatic bradycardia Doing well sp PPM Hematoma is stable Hold coumadin for another 48 hours Wound check in our office on Wednesday of this week  2. afib Holding coumadin for another 48 hours Maintaining sinus rhythm  3. CHF/ s/p MAZE/MVR Continue current lasix regimen followup with Dr Gala Romney and Cornelius Moras as scheduled  Discharge today back to [facility]  Hillis Range, MD 09/22/2012 7:57 AM

## 2012-09-24 ENCOUNTER — Telehealth (HOSPITAL_COMMUNITY): Payer: Self-pay | Admitting: *Deleted

## 2012-09-24 ENCOUNTER — Encounter: Payer: Self-pay | Admitting: Internal Medicine

## 2012-09-24 ENCOUNTER — Ambulatory Visit: Payer: Medicare Other

## 2012-09-24 ENCOUNTER — Ambulatory Visit (INDEPENDENT_AMBULATORY_CARE_PROVIDER_SITE_OTHER): Payer: Medicare Other | Admitting: *Deleted

## 2012-09-24 ENCOUNTER — Ambulatory Visit (HOSPITAL_COMMUNITY): Admit: 2012-09-24 | Payer: Medicare Other

## 2012-09-24 DIAGNOSIS — R001 Bradycardia, unspecified: Secondary | ICD-10-CM

## 2012-09-24 DIAGNOSIS — Z7901 Long term (current) use of anticoagulants: Secondary | ICD-10-CM

## 2012-09-24 DIAGNOSIS — I498 Other specified cardiac arrhythmias: Secondary | ICD-10-CM

## 2012-09-24 DIAGNOSIS — I4891 Unspecified atrial fibrillation: Secondary | ICD-10-CM

## 2012-09-24 LAB — PACEMAKER DEVICE OBSERVATION
AL AMPLITUDE: 2.8 mv
ATRIAL PACING PM: 0
BAMS-0001: 150 {beats}/min
BATTERY VOLTAGE: 2.8 V
RV LEAD IMPEDENCE PM: 690 Ohm
VENTRICULAR PACING PM: 0

## 2012-09-24 LAB — POCT INR: INR: 1.2

## 2012-09-24 MED ORDER — CITALOPRAM HYDROBROMIDE 20 MG PO TABS: 20.0000 mg | ORAL_TABLET | Freq: Every day | ORAL | Status: DC

## 2012-09-24 NOTE — Patient Instructions (Addendum)
A full discussion of the nature of anticoagulants has been carried out.  A benefit risk analysis has been presented to the patient, so that they understand the justification for choosing anticoagulation at this time. The need for frequent and regular monitoring, precise dosage adjustment and compliance is stressed.  Side effects of potential bleeding are discussed.  The patient should avoid any OTC items containing aspirin or ibuprofen, and should avoid great swings in general diet.  Avoid alcohol consumption.  Call if any signs of abnormal bleeding.  Next PT/INR test in wiill be done at [facility].and they will follow INR and dose coumadin while in the nursing home.

## 2012-09-24 NOTE — Progress Notes (Signed)
Wound check-PPM in office. 

## 2012-09-24 NOTE — Telephone Encounter (Signed)
pts daughter called and state that Dr Gala Romney had told them they could increase her Celexa to 20 mg daily but now she is back in a facility and they need that order, ok per Dr Gala Romney, order faxed to [facility] at [phone]

## 2012-09-29 ENCOUNTER — Ambulatory Visit (INDEPENDENT_AMBULATORY_CARE_PROVIDER_SITE_OTHER): Payer: Medicare Other | Admitting: *Deleted

## 2012-09-29 DIAGNOSIS — I498 Other specified cardiac arrhythmias: Secondary | ICD-10-CM

## 2012-09-29 NOTE — Progress Notes (Signed)
Wound recheck.  Hematoma slowly resolving.  Follow up in October with Dr. Johney Frame.

## 2012-10-03 ENCOUNTER — Encounter (HOSPITAL_COMMUNITY): Payer: Self-pay | Admitting: *Deleted

## 2012-10-08 ENCOUNTER — Telehealth (HOSPITAL_COMMUNITY): Payer: Self-pay | Admitting: Anesthesiology

## 2012-10-08 ENCOUNTER — Telehealth: Payer: Self-pay | Admitting: Internal Medicine

## 2012-10-08 NOTE — Telephone Encounter (Signed)
New problem   Brenda/[facility] stated pt has had regular and increased heartrate and lots of weakness. Please call Steward Drone

## 2012-10-08 NOTE — Telephone Encounter (Signed)
Incoming call from [facility]. Patient has increased weakness and HR increased. States she feels the same way she did when before the ICD. Will have patient come in for appt tomorrow.

## 2012-10-08 NOTE — Telephone Encounter (Signed)
Called and left message for [employer].  It looks like she is being seen tomorrow by Dr Gala Romney.  I have let her know they could call back if needed anything from Dr Johney Frame.

## 2012-10-09 ENCOUNTER — Ambulatory Visit (HOSPITAL_BASED_OUTPATIENT_CLINIC_OR_DEPARTMENT_OTHER)
Admission: RE | Admit: 2012-10-09 | Discharge: 2012-10-09 | Disposition: A | Discharge status: Home / Self Care | Payer: Medicare Other | Source: Ambulatory Visit | Attending: Cardiology | Admitting: Cardiology

## 2012-10-09 VITALS — BP 112/52 | HR 81 | Wt 136.5 lb

## 2012-10-09 DIAGNOSIS — I4891 Unspecified atrial fibrillation: Secondary | ICD-10-CM

## 2012-10-09 MED ORDER — AMIODARONE HCL 200 MG PO TABS: 200.0000 mg | ORAL_TABLET | Freq: Two times a day (BID) | ORAL | Status: DC

## 2012-10-09 NOTE — Patient Instructions (Addendum)
Start amiodarone 200 mg twice a day.  Appt with EP next Tuesday.

## 2012-10-09 NOTE — Progress Notes (Signed)
Patient ID: [name] [name], female   DOB: 1924-08-15, 79 y.o.   MRN: [id number] PCP:  Roe Coombs, MD - [facility]yl[facility]   Weight Range   Baseline proBNP     HPI: [name] [name] is a very pleasant 79 y/o woman with h/o HTN, HL, DM2, mitral regurgitation (s/p MVR 2/14), bradycardia (s/p Medtronic dual chamber pacemaker) and PAF.    Experienced PEA arrest at OSH in 2/14. She was transferred to [facility]. Found to have new onset AF with RVR with normal EF and severe MR due to annular dilation. She underwent heart cath which showed non-obstructive CAD. Unfortunately this was complicated by a large left groin bleed. She eventually underwent minimally Invasive Mitral Valve Repair utilizing a 26 mm Sorin Memo 3D ring for annuloplasty and a MAZE Procedure on 05/20/12. Was discharged on 05/27/2012.  Had AFL in 06/06/12 S/P TEE Successful DC-CV.   She returns today for work in appt d/t her heart is racing. Was recently discharged on 09/22/12 for insertion of Medtronic dual chamber pacemaker. She is currently residing at [facility]. She reports that she has noticed her heart racing. She says that rehab will not work with her because her HR is all over the place. Has been on abx for pneumonia. Complains of dizziness, minimal SOB with exertion and fatigue. Daughter present.   ROS: All systems negative except as listed in HPI, PMH and Problem List.  Past Medical History  Diagnosis Date  . Hypertension   . Hyperlipidemia   . Type 2 diabetes mellitus   . Arthritis   . Coronary artery disease     a. Mild nonobstructive by cath 04/2012, cath complicated by groin hematoma.  . S/P mitral valve repair 05/20/2012    a. Severe MR - 04/2012: 26mm Sorin Memo 3D ring annuloplasty via right mini thoracotomy approach  . S/P Maze operation for atrial fibrillation 05/20/2012    a. 04/2012: Complete bi-atrial lesion set using cryothermy  . Atrial fibrillation     a. Diagnosed 04/2012 in the setting of severe MR. b. s/p TEE/successful DCCV  06/06/12.  Marland Kitchen PEA (Pulseless electrical activity)     a. 04/2012 -> adm with afib, developed PEA, transferred to [facility] for prolonged hospitalization.   . Chronic diastolic CHF (congestive heart failure)   . Symptomatic bradycardia     a. s/p Medtronic pacemaker 08/2012 (pocket hematoma)  . Moderate to severe pulmonary hypertension   . Pleural effusion     a. s/p thoracentesis 08/2012.  Marland Kitchen Hypoxemia     a. During admission 08/2012 for for UTI, hypoxia, FTT and pleural effusion s/p thoracentesis with transudative findings.  . Failure to thrive     a. Admission 08/2012.    Current Outpatient Prescriptions  Medication Sig Dispense Refill  . citalopram (CELEXA) 20 MG tablet Take 1 tablet (20 mg total) by mouth daily.      . furosemide (LASIX) 40 MG tablet Take 1 tablet (40 mg total) by mouth daily.  30 tablet    . insulin glargine (LANTUS) 100 UNIT/ML injection Inject 18 Units into the skin at bedtime.      . insulin regular (NOVOLIN R,HUMULIN R) 100 units/mL injection Inject into the skin. Sliding scale      . ipratropium-albuterol (DUONEB) 0.5-2.5 (3) MG/3ML SOLN Take 3 mLs by nebulization 4 (four) times daily as needed.      . magnesium oxide (MAG-OX) 400 MG tablet Take 1 tablet (400 mg total) by mouth 2 (two) times daily.  60  tablet  6  . metFORMIN (GLUCOPHAGE) 500 MG tablet Take 500 mg by mouth 2 (two) times daily with a meal.      . nystatin (MYCOSTATIN/NYSTOP) 100000 UNIT/GM POWD Apply topically 2 (two) times daily. Under breast      . ondansetron (ZOFRAN) 4 MG tablet Take 4 mg by mouth every 4 (four) hours as needed for nausea.      . pravastatin (PRAVACHOL) 40 MG tablet Take 40 mg by mouth at bedtime.      Marland Kitchen rOPINIRole (REQUIP) 0.5 MG tablet Take 0.5 mg by mouth at bedtime.      . traMADol (ULTRAM) 50 MG tablet Take 1-2 tablets (50-100 mg total) by mouth every 4 (four) hours as needed for pain.  10 tablet  0  . traZODone (DESYREL) 50 MG tablet Take 50 mg by mouth at bedtime.      Marland Kitchen warfarin  (COUMADIN) 2 MG tablet Hold Coumadin for 48 hours - have your INR (Coumadin level) checked at [facility] when you go in for your pacemaker check on Wednesday 09/24/12 to determine what your starting dose should be.       No current facility-administered medications for this encounter.   Facility-Administered Medications Ordered in Other Encounters  Medication Dose Route Frequency Provider Last Rate Last Dose  . fentaNYL (SUBLIMAZE) injection    PRN Ellin Goodie, CRNA   50 mcg at 05/15/12 0700  . midazolam (VERSED) 5 MG/5ML injection    PRN Ellin Goodie, CRNA   0.5 mg at 05/15/12 0700     PHYSICAL EXAM: Filed Vitals:   10/09/12 1242  BP: 112/52  Pulse: 81  Weight: 136 lb 8 oz (61.916 kg)  SpO2: 96%    General:  Elderly. Weak appearing and in wheelchair No resp difficulty Daughter present HEENT: normal Neck: supple. JVP 7. Carotids 2+ bilaterally; no bruits. No lymphadenopathy or thryomegaly appreciated. Cor: PMI normal. Regular  No rubs, gallops or murmurs; large hematoma/bruise where pacemaker placed; incision clean Lungs: clear Abdomen: soft, nontender, nondistended. No hepatosplenomegaly. No bruits or masses. Good bowel sounds. Extremities: no cyanosis, clubbing, rash, no edema  Neuro: alert & orientedx3, cranial nerves grossly intact. Moves all 4 extremities w/o difficulty. Affect pleasant.  EKG: NSR   ASSESSMENT & PLAN:  1) Afib - ICD interrogated and the patient is converting back and forth from a-fib to SR. Currently she is in NSR. Atrial rate when in a-fib 222 and V rate ---. Will start on amiodarone 200 mg BID and refer back to EP for an appointment next Tuesday.

## 2012-10-12 ENCOUNTER — Encounter (HOSPITAL_COMMUNITY): Payer: Self-pay | Admitting: Emergency Medicine

## 2012-10-12 ENCOUNTER — Emergency Department (HOSPITAL_COMMUNITY): Payer: Medicare Other

## 2012-10-12 ENCOUNTER — Inpatient Hospital Stay (HOSPITAL_COMMUNITY)
Admission: EM | Admit: 2012-10-12 | Discharge: 2012-10-17 | DRG: 291 | Disposition: A | Discharge status: Skilled Nursing Facility | Payer: Medicare Other | Attending: Internal Medicine | Admitting: Internal Medicine

## 2012-10-12 DIAGNOSIS — I2789 Other specified pulmonary heart diseases: Secondary | ICD-10-CM | POA: Diagnosis present

## 2012-10-12 DIAGNOSIS — Z954 Presence of other heart-valve replacement: Secondary | ICD-10-CM

## 2012-10-12 DIAGNOSIS — I5031 Acute diastolic (congestive) heart failure: Secondary | ICD-10-CM

## 2012-10-12 DIAGNOSIS — Z794 Long term (current) use of insulin: Secondary | ICD-10-CM

## 2012-10-12 DIAGNOSIS — IMO0001 Reserved for inherently not codable concepts without codable children: Secondary | ICD-10-CM

## 2012-10-12 DIAGNOSIS — I4891 Unspecified atrial fibrillation: Secondary | ICD-10-CM | POA: Diagnosis present

## 2012-10-12 DIAGNOSIS — I509 Heart failure, unspecified: Secondary | ICD-10-CM | POA: Diagnosis present

## 2012-10-12 DIAGNOSIS — F3289 Other specified depressive episodes: Secondary | ICD-10-CM | POA: Diagnosis present

## 2012-10-12 DIAGNOSIS — E785 Hyperlipidemia, unspecified: Secondary | ICD-10-CM | POA: Diagnosis present

## 2012-10-12 DIAGNOSIS — E1165 Type 2 diabetes mellitus with hyperglycemia: Secondary | ICD-10-CM | POA: Diagnosis present

## 2012-10-12 DIAGNOSIS — T148XXA Other injury of unspecified body region, initial encounter: Secondary | ICD-10-CM

## 2012-10-12 DIAGNOSIS — Z79899 Other long term (current) drug therapy: Secondary | ICD-10-CM

## 2012-10-12 DIAGNOSIS — I2510 Atherosclerotic heart disease of native coronary artery without angina pectoris: Secondary | ICD-10-CM | POA: Diagnosis present

## 2012-10-12 DIAGNOSIS — J9600 Acute respiratory failure, unspecified whether with hypoxia or hypercapnia: Secondary | ICD-10-CM

## 2012-10-12 DIAGNOSIS — Z7901 Long term (current) use of anticoagulants: Secondary | ICD-10-CM

## 2012-10-12 DIAGNOSIS — I5033 Acute on chronic diastolic (congestive) heart failure: Principal | ICD-10-CM

## 2012-10-12 DIAGNOSIS — Z950 Presence of cardiac pacemaker: Secondary | ICD-10-CM

## 2012-10-12 DIAGNOSIS — F329 Major depressive disorder, single episode, unspecified: Secondary | ICD-10-CM | POA: Diagnosis present

## 2012-10-12 DIAGNOSIS — J189 Pneumonia, unspecified organism: Secondary | ICD-10-CM | POA: Diagnosis present

## 2012-10-12 DIAGNOSIS — R791 Abnormal coagulation profile: Secondary | ICD-10-CM | POA: Diagnosis present

## 2012-10-12 DIAGNOSIS — I10 Essential (primary) hypertension: Secondary | ICD-10-CM | POA: Diagnosis present

## 2012-10-12 DIAGNOSIS — J90 Pleural effusion, not elsewhere classified: Secondary | ICD-10-CM

## 2012-10-12 LAB — CBC WITH DIFFERENTIAL/PLATELET
Basophils Absolute: 0 10*3/uL (ref 0.0–0.1)
Basophils Relative: 0 % (ref 0–1)
HCT: 29.4 % — ABNORMAL LOW (ref 36.0–46.0)
Hemoglobin: 9.8 g/dL — ABNORMAL LOW (ref 12.0–15.0)
Lymphocytes Relative: 13 % (ref 12–46)
MCHC: 33.3 g/dL (ref 30.0–36.0)
Monocytes Absolute: 0.4 10*3/uL (ref 0.1–1.0)
Neutro Abs: 3.6 10*3/uL (ref 1.7–7.7)
Neutrophils Relative %: 78 % — ABNORMAL HIGH (ref 43–77)
RDW: 15.9 % — ABNORMAL HIGH (ref 11.5–15.5)
WBC: 4.7 10*3/uL (ref 4.0–10.5)

## 2012-10-12 LAB — URINALYSIS, ROUTINE W REFLEX MICROSCOPIC
Bilirubin Urine: NEGATIVE
Ketones, ur: NEGATIVE mg/dL
Nitrite: NEGATIVE
Protein, ur: NEGATIVE mg/dL
Urobilinogen, UA: 1 mg/dL (ref 0.0–1.0)
pH: 7 (ref 5.0–8.0)

## 2012-10-12 LAB — URINE MICROSCOPIC-ADD ON

## 2012-10-12 LAB — COMPREHENSIVE METABOLIC PANEL
ALT: 11 U/L (ref 0–35)
AST: 18 U/L (ref 0–37)
Albumin: 2.9 g/dL — ABNORMAL LOW (ref 3.5–5.2)
Alkaline Phosphatase: 75 U/L (ref 39–117)
CO2: 24 mEq/L (ref 19–32)
Chloride: 97 mEq/L (ref 96–112)
GFR calc non Af Amer: 39 mL/min — ABNORMAL LOW (ref >90)
Potassium: 3.7 mEq/L (ref 3.5–5.1)
Total Bilirubin: 0.7 mg/dL (ref 0.3–1.2)

## 2012-10-12 LAB — GLUCOSE, CAPILLARY

## 2012-10-12 LAB — PRO B NATRIURETIC PEPTIDE: Pro B Natriuretic peptide (BNP): 7410 pg/mL — ABNORMAL HIGH (ref 0–450)

## 2012-10-12 MED ORDER — SIMVASTATIN 20 MG PO TABS
20.0000 mg | ORAL_TABLET | Freq: Every day | ORAL | Status: DC
  Administered 2012-10-12 – 2012-10-16 (×5): 20 mg via ORAL
  Filled 2012-10-12 (×6): qty 1

## 2012-10-12 MED ORDER — IPRATROPIUM BROMIDE 0.02 % IN SOLN: 0.5000 mg | Freq: Four times a day (QID) | RESPIRATORY_TRACT | Status: DC

## 2012-10-12 MED ORDER — ROPINIROLE HCL 0.5 MG PO TABS
0.5000 mg | ORAL_TABLET | Freq: Every day | ORAL | Status: DC
  Administered 2012-10-12 – 2012-10-16 (×5): 0.5 mg via ORAL
  Filled 2012-10-12 (×6): qty 1

## 2012-10-12 MED ORDER — ALUM & MAG HYDROXIDE-SIMETH 200-200-20 MG/5ML PO SUSP: 30.0000 mL | Freq: Four times a day (QID) | ORAL | Status: DC | PRN

## 2012-10-12 MED ORDER — INSULIN ASPART 100 UNIT/ML ~~LOC~~ SOLN
0.0000 [IU] | Freq: Three times a day (TID) | SUBCUTANEOUS | Status: DC
  Administered 2012-10-13: 2 [IU] via SUBCUTANEOUS
  Administered 2012-10-14: 1 [IU] via SUBCUTANEOUS
  Administered 2012-10-15: 2 [IU] via SUBCUTANEOUS
  Administered 2012-10-15 – 2012-10-16 (×2): 1 [IU] via SUBCUTANEOUS
  Administered 2012-10-16 – 2012-10-17 (×2): 2 [IU] via SUBCUTANEOUS

## 2012-10-12 MED ORDER — ACETAMINOPHEN 650 MG RE SUPP: 650.0000 mg | Freq: Four times a day (QID) | RECTAL | Status: DC | PRN

## 2012-10-12 MED ORDER — TRAMADOL HCL 50 MG PO TABS
50.0000 mg | ORAL_TABLET | Freq: Every day | ORAL | Status: DC
  Administered 2012-10-12 – 2012-10-15 (×4): 50 mg via ORAL
  Filled 2012-10-12 (×4): qty 1

## 2012-10-12 MED ORDER — ONDANSETRON HCL 4 MG/2ML IJ SOLN: 4.0000 mg | Freq: Four times a day (QID) | INTRAMUSCULAR | Status: DC | PRN

## 2012-10-12 MED ORDER — DOCUSATE SODIUM 100 MG PO CAPS
100.0000 mg | ORAL_CAPSULE | Freq: Two times a day (BID) | ORAL | Status: DC
  Administered 2012-10-12 – 2012-10-17 (×10): 100 mg via ORAL
  Filled 2012-10-12 (×11): qty 1

## 2012-10-12 MED ORDER — VANCOMYCIN HCL IN DEXTROSE 750-5 MG/150ML-% IV SOLN
750.0000 mg | INTRAVENOUS | Status: DC
  Administered 2012-10-12 – 2012-10-16 (×5): 750 mg via INTRAVENOUS
  Filled 2012-10-12 (×6): qty 150

## 2012-10-12 MED ORDER — CITALOPRAM HYDROBROMIDE 20 MG PO TABS
20.0000 mg | ORAL_TABLET | Freq: Every day | ORAL | Status: DC
  Administered 2012-10-13 – 2012-10-17 (×5): 20 mg via ORAL
  Filled 2012-10-12 (×5): qty 1

## 2012-10-12 MED ORDER — ALBUTEROL SULFATE (5 MG/ML) 0.5% IN NEBU
2.5000 mg | INHALATION_SOLUTION | Freq: Three times a day (TID) | RESPIRATORY_TRACT | Status: DC
  Administered 2012-10-13 – 2012-10-15 (×7): 2.5 mg via RESPIRATORY_TRACT
  Filled 2012-10-12 (×6): qty 0.5

## 2012-10-12 MED ORDER — ALBUTEROL SULFATE (5 MG/ML) 0.5% IN NEBU
2.5000 mg | INHALATION_SOLUTION | Freq: Four times a day (QID) | RESPIRATORY_TRACT | Status: DC
  Administered 2012-10-12: 2.5 mg via RESPIRATORY_TRACT
  Filled 2012-10-12: qty 0.5

## 2012-10-12 MED ORDER — NYSTATIN 100000 UNIT/GM EX POWD
Freq: Two times a day (BID) | CUTANEOUS | Status: DC
  Administered 2012-10-12 – 2012-10-17 (×9): via TOPICAL
  Filled 2012-10-12: qty 15

## 2012-10-12 MED ORDER — TRAZODONE HCL 50 MG PO TABS
50.0000 mg | ORAL_TABLET | Freq: Every day | ORAL | Status: DC
  Administered 2012-10-12 – 2012-10-16 (×5): 50 mg via ORAL
  Filled 2012-10-12 (×6): qty 1

## 2012-10-12 MED ORDER — IPRATROPIUM BROMIDE 0.02 % IN SOLN
0.5000 mg | Freq: Three times a day (TID) | RESPIRATORY_TRACT | Status: DC
  Administered 2012-10-13 – 2012-10-15 (×7): 0.5 mg via RESPIRATORY_TRACT
  Filled 2012-10-12 (×6): qty 2.5

## 2012-10-12 MED ORDER — ACETAMINOPHEN 325 MG PO TABS: 650.0000 mg | ORAL_TABLET | Freq: Four times a day (QID) | ORAL | Status: DC | PRN

## 2012-10-12 MED ORDER — INSULIN GLARGINE 100 UNIT/ML ~~LOC~~ SOLN
18.0000 [IU] | Freq: Every day | SUBCUTANEOUS | Status: DC
  Administered 2012-10-12 – 2012-10-14 (×3): 18 [IU] via SUBCUTANEOUS
  Filled 2012-10-12 (×4): qty 0.18

## 2012-10-12 MED ORDER — IPRATROPIUM BROMIDE 0.02 % IN SOLN
0.5000 mg | Freq: Four times a day (QID) | RESPIRATORY_TRACT | Status: DC
  Administered 2012-10-12: 0.5 mg via RESPIRATORY_TRACT
  Filled 2012-10-12: qty 2.5

## 2012-10-12 MED ORDER — DEXTROSE 5 % IV SOLN
1.0000 g | INTRAVENOUS | Status: DC
  Administered 2012-10-12 – 2012-10-16 (×5): 1 g via INTRAVENOUS
  Filled 2012-10-12 (×6): qty 1

## 2012-10-12 MED ORDER — SODIUM CHLORIDE 0.9 % IV SOLN: 250.0000 mL | INTRAVENOUS | Status: DC | PRN

## 2012-10-12 MED ORDER — SODIUM CHLORIDE 0.9 % IJ SOLN: 3.0000 mL | INTRAMUSCULAR | Status: DC | PRN

## 2012-10-12 MED ORDER — SODIUM CHLORIDE 0.9 % IJ SOLN
3.0000 mL | Freq: Two times a day (BID) | INTRAMUSCULAR | Status: DC
  Administered 2012-10-15 – 2012-10-17 (×3): 3 mL via INTRAVENOUS

## 2012-10-12 MED ORDER — FUROSEMIDE 10 MG/ML IJ SOLN
40.0000 mg | Freq: Every day | INTRAMUSCULAR | Status: DC
  Administered 2012-10-12 – 2012-10-15 (×4): 40 mg via INTRAVENOUS
  Filled 2012-10-12 (×5): qty 4

## 2012-10-12 MED ORDER — AMIODARONE HCL 200 MG PO TABS
200.0000 mg | ORAL_TABLET | Freq: Two times a day (BID) | ORAL | Status: DC
Start: 2012-10-12 — End: 2012-10-17
  Administered 2012-10-12 – 2012-10-17 (×10): 200 mg via ORAL
  Filled 2012-10-12 (×11): qty 1

## 2012-10-12 MED ORDER — ONDANSETRON HCL 4 MG PO TABS: 4.0000 mg | ORAL_TABLET | Freq: Four times a day (QID) | ORAL | Status: DC | PRN

## 2012-10-12 MED ORDER — IPRATROPIUM-ALBUTEROL 0.5-2.5 (3) MG/3ML IN SOLN: 3.0000 mL | Freq: Four times a day (QID) | RESPIRATORY_TRACT | Status: DC

## 2012-10-12 MED ORDER — MAGNESIUM OXIDE 400 (241.3 MG) MG PO TABS
400.0000 mg | ORAL_TABLET | Freq: Two times a day (BID) | ORAL | Status: DC
  Administered 2012-10-12 – 2012-10-17 (×10): 400 mg via ORAL
  Filled 2012-10-12 (×11): qty 1

## 2012-10-12 MED ORDER — ALBUTEROL SULFATE (5 MG/ML) 0.5% IN NEBU: 2.5000 mg | INHALATION_SOLUTION | Freq: Three times a day (TID) | RESPIRATORY_TRACT | Status: DC

## 2012-10-12 MED ORDER — SODIUM CHLORIDE 0.9 % IJ SOLN
3.0000 mL | Freq: Two times a day (BID) | INTRAMUSCULAR | Status: DC
  Administered 2012-10-12 – 2012-10-16 (×8): 3 mL via INTRAVENOUS

## 2012-10-12 MED ORDER — GUAIFENESIN 100 MG/5ML PO SYRP
200.0000 mg | ORAL_SOLUTION | ORAL | Status: DC | PRN
  Filled 2012-10-12: qty 10

## 2012-10-12 MED ORDER — MAGNESIUM OXIDE 400 MG PO TABS: 400.0000 mg | ORAL_TABLET | Freq: Two times a day (BID) | ORAL | Status: DC

## 2012-10-12 MED ORDER — ALBUTEROL SULFATE (5 MG/ML) 0.5% IN NEBU: 2.5000 mg | INHALATION_SOLUTION | RESPIRATORY_TRACT | Status: DC | PRN

## 2012-10-12 NOTE — ED Provider Notes (Signed)
History    CSN: [id number] Arrival date & time 10/12/12  1444  First MD Initiated Contact with Patient 10/12/12 1459     Chief Complaint  Patient presents with  . Pneumonia   (Consider location/radiation/quality/duration/timing/severity/associated sxs/prior Treatment) Patient is a 79 y.o. female presenting with shortness of breath.  Shortness of Breath Severity:  Moderate Onset quality:  Gradual Duration:  1 week Timing:  Constant Progression:  Unchanged Chronicity:  New Context comment:  Diagnosed with pneumonia at her nursing facility, treated with 1 weeks course of abx.   Relieved by: rest, oxygen. Worsened by:  Exertion Ineffective treatments: abx. Associated symptoms: cough   Associated symptoms: no abdominal pain, no chest pain, no fever, no sputum production and no vomiting   Cough:    Cough characteristics:  Non-productive  Past Medical History  Diagnosis Date  . Hypertension   . Hyperlipidemia   . Type 2 diabetes mellitus   . Arthritis   . Coronary artery disease     a. Mild nonobstructive by cath 04/2012, cath complicated by groin hematoma.  . S/P mitral valve repair 05/20/2012    a. Severe MR - 04/2012: 26mm Sorin Memo 3D ring annuloplasty via right mini thoracotomy approach  . S/P Maze operation for atrial fibrillation 05/20/2012    a. 04/2012: Complete bi-atrial lesion set using cryothermy  . Atrial fibrillation     a. Diagnosed 04/2012 in the setting of severe MR. b. s/p TEE/successful DCCV 06/06/12.  Marland Kitchen PEA (Pulseless electrical activity)     a. 04/2012 -> adm with afib, developed PEA, transferred to [facility] for prolonged hospitalization.   . Chronic diastolic CHF (congestive heart failure)   . Symptomatic bradycardia     a. s/p Medtronic pacemaker 08/2012 (pocket hematoma)  . Moderate to severe pulmonary hypertension   . Pleural effusion     a. s/p thoracentesis 08/2012.  Marland Kitchen Hypoxemia     a. During admission 08/2012 for for UTI, hypoxia, FTT and pleural effusion  s/p thoracentesis with transudative findings.  . Failure to thrive     a. Admission 08/2012.   Past Surgical History  Procedure Laterality Date  . Abdominal hysterectomy    . Cholecystectomy    . Eye surgery    . Appendectomy    . Tee without cardioversion N/A 05/09/2012    Procedure: TRANSESOPHAGEAL ECHOCARDIOGRAM (TEE);  Surgeon: Dolores Patty, MD;  Location: [facility] ENDOSCOPY;  Service: Cardiovascular;  Laterality: N/A;  . Mitral valve repair Right 05/20/2012    Procedure: MINIMALLY INVASIVE MITRAL VALVE REPAIR (MVR);  Surgeon: Purcell Nails, MD;  Location: [facility] OR;  Service: Open Heart Surgery;  Laterality: Right;  . Minimally invasive maze procedure N/A 05/20/2012    Procedure: MINIMALLY INVASIVE MAZE PROCEDURE;  Surgeon: Purcell Nails, MD;  Location: [facility] OR;  Service: Open Heart Surgery;  Laterality: N/A;  . Intraoperative transesophageal echocardiogram N/A 05/20/2012    Procedure: INTRAOPERATIVE TRANSESOPHAGEAL ECHOCARDIOGRAM;  Surgeon: Purcell Nails, MD;  Location: [facility] OR;  Service: Open Heart Surgery;  Laterality: N/A;  . Tee without cardioversion N/A 06/06/2012    Procedure: TRANSESOPHAGEAL ECHOCARDIOGRAM (TEE);  Surgeon: Dolores Patty, MD;  Location: [facility] ENDOSCOPY;  Service: Cardiovascular;  Laterality: N/A;  . Cardioversion N/A 06/06/2012    Procedure: CARDIOVERSION;  Surgeon: Dolores Patty, MD;  Location: [facility] ENDOSCOPY;  Service: Cardiovascular;  Laterality: N/A;  . Pacemaker insertion  09/19/2012    MDT ADDRL1 dual chamber pacemaker implanted by Dr Johney Frame for symptomatic bradycardia   Family  History  Problem Relation Age of Onset  . CAD Father 50    Died of MI  . CAD Brother 60    Died of MI  . CAD Brother 73    Died of MI  . CAD Mother 91    Died of MI   History  Substance Use Topics  . Smoking status: Never Smoker   . Smokeless tobacco: Never Used  . Alcohol Use: No   OB History   Grav Para Term Preterm Abortions TAB SAB Ect Mult Living                  Review of Systems  Constitutional: Negative for fever.  HENT: Negative for congestion.   Respiratory: Positive for cough and shortness of breath. Negative for sputum production.   Cardiovascular: Negative for chest pain.  Gastrointestinal: Negative for nausea, vomiting, abdominal pain and diarrhea.  All other systems reviewed and are negative.    Allergies  Codeine  Home Medications   Current Outpatient Rx  Name  Route  Sig  Dispense  Refill  . amiodarone (PACERONE) 200 MG tablet   Oral   Take 1 tablet (200 mg total) by mouth 2 (two) times daily.   60 tablet   3   . furosemide (LASIX) 40 MG tablet   Oral   Take 1 tablet (40 mg total) by mouth daily.   30 tablet      . insulin glargine (LANTUS) 100 UNIT/ML injection   Subcutaneous   Inject 18 Units into the skin at bedtime.         Marland Kitchen EXPIRED: ipratropium-albuterol (DUONEB) 0.5-2.5 (3) MG/3ML SOLN   Nebulization   Take 3 mLs by nebulization 4 (four) times daily as needed.         . magnesium oxide (MAG-OX) 400 MG tablet   Oral   Take 1 tablet (400 mg total) by mouth 2 (two) times daily.   60 tablet   6   . metFORMIN (GLUCOPHAGE) 500 MG tablet   Oral   Take 500 mg by mouth 2 (two) times daily with a meal.         . nystatin (MYCOSTATIN/NYSTOP) 100000 UNIT/GM POWD   Topical   Apply topically 2 (two) times daily. Under breast         . ondansetron (ZOFRAN) 4 MG tablet   Oral   Take 4 mg by mouth every 4 (four) hours as needed for nausea.         . pravastatin (PRAVACHOL) 40 MG tablet   Oral   Take 40 mg by mouth at bedtime.         Marland Kitchen rOPINIRole (REQUIP) 0.5 MG tablet   Oral   Take 0.5 mg by mouth at bedtime.         . traMADol (ULTRAM) 50 MG tablet   Oral   Take 1-2 tablets (50-100 mg total) by mouth every 4 (four) hours as needed for pain.   10 tablet   0   . traZODone (DESYREL) 50 MG tablet   Oral   Take 50 mg by mouth at bedtime.         Marland Kitchen warfarin (COUMADIN) 2 MG tablet       Hold Coumadin for 48 hours - have your INR (Coumadin level) checked at [facility] when you go in for your pacemaker check on Wednesday 09/24/12 to determine what your starting dose should be.          Pulse  79  Temp(Src) 98.2 F (36.8 C)  SpO2 98% Physical Exam  Nursing note and vitals reviewed. Constitutional: She is oriented to person, place, and time. She appears well-developed and well-nourished. No distress.  HENT:  Head: Normocephalic and atraumatic.  Mouth/Throat: Oropharynx is clear and moist.  Eyes: Conjunctivae are normal. Pupils are equal, round, and reactive to light. No scleral icterus.  Neck: Neck supple.  Cardiovascular: Normal rate, regular rhythm, normal heart sounds and intact distal pulses.   No murmur heard. Pulmonary/Chest: Effort normal. No stridor. No respiratory distress. She has decreased breath sounds (right base). She has rales (left base).  Abdominal: Soft. Bowel sounds are normal. She exhibits no distension. There is no tenderness.  Musculoskeletal: Normal range of motion.  Neurological: She is alert and oriented to person, place, and time.  Skin: Skin is warm and dry. No rash noted.  Psychiatric: She has a normal mood and affect. Her behavior is normal.    ED Course  Procedures (including critical care time) Labs Reviewed  CBC WITH DIFFERENTIAL - Abnormal; Notable for the following:    RBC 3.12 (*)    Hemoglobin 9.8 (*)    HCT 29.4 (*)    RDW 15.9 (*)    Neutrophils Relative % 78 (*)    Lymphs Abs 0.6 (*)    All other components within normal limits  COMPREHENSIVE METABOLIC PANEL - Abnormal; Notable for the following:    Sodium 134 (*)    Creatinine, Ser 1.23 (*)    Total Protein 5.8 (*)    Albumin 2.9 (*)    GFR calc non Af Amer 39 (*)    GFR calc Af Amer 45 (*)    All other components within normal limits  LACTIC ACID, PLASMA - Abnormal; Notable for the following:    Lactic Acid, Venous 4.5 (*)    All other components within normal  limits  PRO B NATRIURETIC PEPTIDE - Abnormal; Notable for the following:    Pro B Natriuretic peptide (BNP) 7410.0 (*)    All other components within normal limits  PROTIME-INR - Abnormal; Notable for the following:    Prothrombin Time 46.1 (*)    INR 5.25 (*)    All other components within normal limits  CULTURE, BLOOD (ROUTINE X 2)  CULTURE, BLOOD (ROUTINE X 2)  TROPONIN I   Dg Chest 2 View  10/12/2012   *RADIOLOGY REPORT*  Clinical Data: Short of breath.  CHEST - 2 VIEW  Comparison: 09/20/2012  Findings: Left chest wall pacer device is noted with lead in the right atrial appendage and right ventricle.  The heart size is normal.  There are bilateral pleural effusions right greater than left.  Compared with previous exam the right pleural effusion has increased in the interval.  Atelectasis noted in the lung bases.  IMPRESSION:  1.  Bilateral pleural effusions, right greater than left. 2.  Interval increase in volume of right effusion.   Original Report Authenticated By: Signa Kell, M.D.  All radiology studies independently viewed by me.     EKG - NSR, rate 79, normal axis, QTc 472, nonspecific t wave changes, no significant changes compared to prior.    1. Pleural effusion   2. Acute on chronic diastolic heart failure     MDM  79 yo female with diastolic heart failure sent from nursing facility for bilateral pneumonia and hypoxia.  Covered from HCAP but I think her hypoxia is more likely secondary to heart failure and pleural effusions.  To be admitted to [facility].    Candyce Churn, MD 10/12/12 1740

## 2012-10-12 NOTE — ED Notes (Signed)
Patient returned from xray at this time.

## 2012-10-12 NOTE — ED Notes (Signed)
Admitting doctor at bedside 

## 2012-10-12 NOTE — Progress Notes (Signed)
ANTIBIOTIC CONSULT NOTE - INITIAL  Pharmacy Consult for Vancomycin/Cefepim Indication: pneumonia  Allergies  Allergen Reactions  . Codeine Nausea And Vomiting    Patient Measurements: Height: 5' 4.17" (163 cm) Weight: 136 lb 7.4 oz (61.9 kg) IBW/kg (Calculated) : 55.1  Vital Signs: Temp: 98.2 F (36.8 C) (07/20 1448) Pulse Rate: 79 (07/20 1448) Intake/Output from previous day:   Intake/Output from this shift:    Labs:  Recent Labs  10/12/12 1537  WBC 4.7  HGB 9.8*  PLT 183  CREATININE 1.23*   Estimated Creatinine Clearance: 28.6 ml/min (by C-G formula based on Cr of 1.23). No results found for this basename: VANCOTROUGH, VANCOPEAK, VANCORANDOM, GENTTROUGH, GENTPEAK, GENTRANDOM, TOBRATROUGH, TOBRAPEAK, TOBRARND, AMIKACINPEAK, AMIKACINTROU, AMIKACIN,  in the last 72 hours   Microbiology: No results found for this or any previous visit (from the past 720 hour(s)).  Medical History: Past Medical History  Diagnosis Date  . Hypertension   . Hyperlipidemia   . Type 2 diabetes mellitus   . Arthritis   . Coronary artery disease     a. Mild nonobstructive by cath 04/2012, cath complicated by groin hematoma.  . S/P mitral valve repair 05/20/2012    a. Severe MR - 04/2012: 26mm Sorin Memo 3D ring annuloplasty via right mini thoracotomy approach  . S/P Maze operation for atrial fibrillation 05/20/2012    a. 04/2012: Complete bi-atrial lesion set using cryothermy  . Atrial fibrillation     a. Diagnosed 04/2012 in the setting of severe MR. b. s/p TEE/successful DCCV 06/06/12.  Marland Kitchen PEA (Pulseless electrical activity)     a. 04/2012 -> adm with afib, developed PEA, transferred to [facility] for prolonged hospitalization.   . Chronic diastolic CHF (congestive heart failure)   . Symptomatic bradycardia     a. s/p Medtronic pacemaker 08/2012 (pocket hematoma)  . Moderate to severe pulmonary hypertension   . Pleural effusion     a. s/p thoracentesis 08/2012.  Marland Kitchen Hypoxemia     a. During  admission 08/2012 for for UTI, hypoxia, FTT and pleural effusion s/p thoracentesis with transudative findings.  . Failure to thrive     a. Admission 08/2012.    Medications:   (Not in a hospital admission) Scheduled:  Assessment: 79 y/o female patient admitted with shortness of breath, found to have bilateral pneumonia requiring broad spectrum antibiotics for HCAP.   Goal of Therapy:  Vancomycin trough level 15-20 mcg/ml  Plan:  Vancomycin 750mg  IV q24 and cefepime 1g IV q24, will monitor renal function. Measure antibiotic drug levels at steady state Follow up culture results  Verlene Mayer, PharmD, BCPS Pager [phone] 10/12/2012,5:07 PM

## 2012-10-12 NOTE — ED Notes (Signed)
Critical lab values received from lab- Dr. Loretha Stapler made aware.

## 2012-10-12 NOTE — ED Notes (Signed)
Patient currently in xray at this time 

## 2012-10-12 NOTE — ED Notes (Signed)
Pt arrived to ED by [facility] from [facility] in [place]. Pt had a pacemaker put in 2-3weeks ago and developed PNA shortly after and received Levaquin. CXR today showed bilateral PNA. Pt has been needing to wear O2 d/t o2sat decreased to 85%ra and normally pt is able to maintain O2sats above 90s without O2.

## 2012-10-12 NOTE — ED Notes (Signed)
Patient placed in hospital gown and patient belongings given to daughter Velna Hatchet.

## 2012-10-12 NOTE — H&P (Addendum)
PATIENT DETAILS Name: [name] [name] Age: 79 y.o. Sex: female Date of Birth: 1924/08/10 Admit Date: 10/12/2012 [id number] S, MD   CHIEF COMPLAINT:  Cough and exertional shortness of breath for approximately 2 weeks  HPI: [name] is a 79 y.o. female with a Past Medical History of a recent permanent pacemaker implantation complicated by a pacemaker pocket hematoma, atrial fibrillation on chronic Coumadin therapy, mitral regurgitation status post mitral valve replacement in February of 2014, diabetes and who presents today with the above noted complaint. Patient has had prior pleural effusion in the past, per discharge summary on June, pleural fluid analysis was consistent with transudative pleural effusion likely secondary to chronic diastolic heart failure. Patient subsequently was readmitted in late June and underwent a permanent pacemaker implantation. She was then discharged to a skilled nursing facility, she was slowly making progress, however approximately 2 weeks back she then started experiencing a dry hacking cough. She was placed on a week of Levaquin, which she just finished this past Wednesday. She continued to have cough, per her and her family she has functional decline and has had issues with generalized weakness. Patient claims that doing her regular activities of living also make her short of breath. She denies any shortness of breath at rest. Denies any chest pain. Denies any peripheral edema. Because of these ongoing symptoms, repeat chest x-ray was done at the skilled nursing facility today, which was read as worsening of her pleural effusions with questionable pneumonia. She was then transferred to the emergency room for further evaluation and treatment, I was subsequently called to admit this patient. Patient denies any chest pain, fever, nausea, vomiting, or diarrhea. She denies any abdominal pain.   ALLERGIES:   Allergies  Allergen Reactions  . Codeine Nausea And  Vomiting    PAST MEDICAL HISTORY: Past Medical History  Diagnosis Date  . Hypertension   . Hyperlipidemia   . Type 2 diabetes mellitus   . Arthritis   . Coronary artery disease     a. Mild nonobstructive by cath 04/2012, cath complicated by groin hematoma.  . S/P mitral valve repair 05/20/2012    a. Severe MR - 04/2012: 26mm Sorin Memo 3D ring annuloplasty via right mini thoracotomy approach  . S/P Maze operation for atrial fibrillation 05/20/2012    a. 04/2012: Complete bi-atrial lesion set using cryothermy  . Atrial fibrillation     a. Diagnosed 04/2012 in the setting of severe MR. b. s/p TEE/successful DCCV 06/06/12.  Marland Kitchen PEA (Pulseless electrical activity)     a. 04/2012 -> adm with afib, developed PEA, transferred to [facility] for prolonged hospitalization.   . Chronic diastolic CHF (congestive heart failure)   . Symptomatic bradycardia     a. s/p Medtronic pacemaker 08/2012 (pocket hematoma)  . Moderate to severe pulmonary hypertension   . Pleural effusion     a. s/p thoracentesis 08/2012.  Marland Kitchen Hypoxemia     a. During admission 08/2012 for for UTI, hypoxia, FTT and pleural effusion s/p thoracentesis with transudative findings.  . Failure to thrive     a. Admission 08/2012.    PAST SURGICAL HISTORY: Past Surgical History  Procedure Laterality Date  . Abdominal hysterectomy    . Cholecystectomy    . Eye surgery    . Appendectomy    . Tee without cardioversion N/A 05/09/2012    Procedure: TRANSESOPHAGEAL ECHOCARDIOGRAM (TEE);  Surgeon: Dolores Patty, MD;  Location[facility] ENDOSCOPY;  Service: Cardiovascular;  Laterality: N/A;  . Mitral valve repair Right  05/20/2012    Procedure: MINIMALLY INVASIVE MITRAL VALVE REPAIR (MVR);  Surgeon: Purcell Nails, MD;  Location: [facility] OR;  Service: Open Heart Surgery;  Laterality: Right;  . Minimally invasive maze procedure N/A 05/20/2012    Procedure: MINIMALLY INVASIVE MAZE PROCEDURE;  Surgeon: Purcell Nails, MD;  Location: [facility] OR;  Service: Open Heart  Surgery;  Laterality: N/A;  . Intraoperative transesophageal echocardiogram N/A 05/20/2012    Procedure: INTRAOPERATIVE TRANSESOPHAGEAL ECHOCARDIOGRAM;  Surgeon: Purcell Nails, MD;  Location: [facility] OR;  Service: Open Heart Surgery;  Laterality: N/A;  . Tee without cardioversion N/A 06/06/2012    Procedure: TRANSESOPHAGEAL ECHOCARDIOGRAM (TEE);  Surgeon: Dolores Patty, MD;  Location: [facility] ENDOSCOPY;  Service: Cardiovascular;  Laterality: N/A;  . Cardioversion N/A 06/06/2012    Procedure: CARDIOVERSION;  Surgeon: Dolores Patty, MD;  Location: [facility] ENDOSCOPY;  Service: Cardiovascular;  Laterality: N/A;  . Pacemaker insertion  09/19/2012    MDT ADDRL1 dual chamber pacemaker implanted by Dr Johney Frame for symptomatic bradycardia    MEDICATIONS AT HOME: Prior to Admission medications   Medication Sig Start Date End Date Taking? Authorizing Provider  amiodarone (PACERONE) 200 MG tablet Take 1 tablet (200 mg total) by mouth 2 (two) times daily. 10/09/12  Yes Aundria Rud, NP  citalopram (CELEXA) 20 MG tablet Take 20 mg by mouth daily.   Yes Historical Provider, MD  furosemide (LASIX) 40 MG tablet Take 1 tablet (40 mg total) by mouth daily. 09/14/12  Yes Leroy Sea, MD  insulin glargine (LANTUS) 100 UNIT/ML injection Inject 18 Units into the skin at bedtime.   Yes Historical Provider, MD  ipratropium-albuterol (DUONEB) 0.5-2.5 (3) MG/3ML SOLN Take 3 mLs by nebulization 4 (four) times daily as needed. 10/02/12 10/12/12 Yes Historical Provider, MD  magnesium oxide (MAG-OX) 400 MG tablet Take 1 tablet (400 mg total) by mouth 2 (two) times daily. 07/25/12  Yes Dolores Patty, MD  metFORMIN (GLUCOPHAGE) 500 MG tablet Take 500 mg by mouth 2 (two) times daily with a meal.   Yes Historical Provider, MD  nystatin (MYCOSTATIN/NYSTOP) 100000 UNIT/GM POWD Apply topically 2 (two) times daily. Under breast 10/05/12 10/13/12 Yes Historical Provider, MD  ondansetron (ZOFRAN) 4 MG tablet Take 4 mg by mouth every 4  (four) hours as needed for nausea.   Yes Historical Provider, MD  pravastatin (PRAVACHOL) 40 MG tablet Take 40 mg by mouth at bedtime. 07/25/12  Yes Bevelyn Buckles Bensimhon, MD  rOPINIRole (REQUIP) 0.5 MG tablet Take 0.5 mg by mouth at bedtime.   Yes Historical Provider, MD  traMADol (ULTRAM) 50 MG tablet Take 50 mg by mouth at bedtime.   Yes Historical Provider, MD  traZODone (DESYREL) 50 MG tablet Take 50 mg by mouth at bedtime.   Yes Historical Provider, MD  warfarin (COUMADIN) 3 MG tablet Take 3 mg by mouth daily.   Yes Historical Provider, MD    FAMILY HISTORY: Family History  Problem Relation Age of Onset  . CAD Father 71    Died of MI  . CAD Brother 56    Died of MI  . CAD Brother 64    Died of MI  . CAD Mother 13    Died of MI    SOCIAL HISTORY:  reports that she has never smoked. She has never used smokeless tobacco. She reports that she does not drink alcohol or use illicit drugs.  REVIEW OF SYSTEMS:  Constitutional:   No  weight loss, night sweats,  Fevers, chills, fatigue.  HEENT:    No headaches, Difficulty swallowing,Tooth/dental problems,Sore throat,  No sneezing, itching, ear ache, nasal congestion, post nasal drip,   Cardio-vascular: No chest pain,  Orthopnea, PND, swelling in lower extremities, anasarca, dizziness, palpitations  GI:  No heartburn, indigestion, abdominal pain, nausea, vomiting, diarrhea, change in  bowel habits, loss of appetite  Resp: No shortness of breath  at rest.  No excess mucus, no productive cough,   No coughing up of blood.No change in color of mucus.No wheezing.No chest wall deformity  Skin:  no rash or lesions.  GU:  no dysuria, change in color of urine, no urgency or frequency.  No flank pain.  Musculoskeletal: No joint pain or swelling.  No decreased range of motion.  No back pain.  Psych: No change in mood or affect. No depression or anxiety.  No memory loss.   PHYSICAL EXAM: Blood pressure 133/42, pulse 73, temperature  98.2 F (36.8 C), resp. rate 27, height 5' 4.17" (1.63 m), weight 61.9 kg (136 lb 7.4 oz), SpO2 99.00%.  General appearance :Awake, alert, not in any distress. Speech Clear. Not toxic Looking HEENT: Atraumatic and Normocephalic, pupils equally reactive to light and accomodation Neck: supple, no JVD. No cervical lymphadenopathy.  Chest: Decreased entry at bilateral bases right more than left, no added sounds. Bruising and swelling seen over the left pacemaker site. Old bruising/ecchymosis seen over the left breast area. CVS: S1 S2 regular, no murmurs.  Abdomen: Bowel sounds present, Non tender and not distended with no gaurding, rigidity or rebound. Extremities: B/L Lower Ext shows no edema, both legs are warm to touch Neurology: Awake alert, and oriented X 3, CN II-XII intact, Non focal Skin:No Rash Wounds:N/A  LABS ON ADMISSION:   Recent Labs  10/12/12 1537  NA 134*  K 3.7  CL 97  CO2 24  GLUCOSE 91  BUN 23  CREATININE 1.23*  CALCIUM 9.0    Recent Labs  10/12/12 1537  AST 18  ALT 11  ALKPHOS 75  BILITOT 0.7  PROT 5.8*  ALBUMIN 2.9*   No results found for this basename: LIPASE, AMYLASE,  in the last 72 hours  Recent Labs  10/12/12 1537  WBC 4.7  NEUTROABS 3.6  HGB 9.8*  HCT 29.4*  MCV 94.2  PLT 183    Recent Labs  10/12/12 1537  TROPONINI <0.30   No results found for this basename: DDIMER,  in the last 72 hours No components found with this basename: POCBNP,    RADIOLOGIC STUDIES ON ADMISSION: Dg Chest 2 View  10/12/2012   *RADIOLOGY REPORT*  Clinical Data: Short of breath.  CHEST - 2 VIEW  Comparison: 09/20/2012  Findings: Left chest wall pacer device is noted with lead in the right atrial appendage and right ventricle.  The heart size is normal.  There are bilateral pleural effusions right greater than left.  Compared with previous exam the right pleural effusion has increased in the interval.  Atelectasis noted in the lung bases.  IMPRESSION:  1.   Bilateral pleural effusions, right greater than left. 2.  Interval increase in volume of right effusion.   Original Report Authenticated By: Signa Kell, M.D.    ASSESSMENT AND PLAN: Present on Admission:  . Acute hypoxic respiratory failure - Suspect this is secondary to worsening of her pleural effusion.  - Currently she is maintaining O2 saturation which is 2 L of oxygen  - We will continue oxygen via nasal cannula, and will further workup regarding the etiology of  underlying pleural effusion  . Pleural effusion - Suspect this is related to underlying chronic diastolic heart failure. - However given her history of recent pneumonia-this is a possibility that she may have a parapneumonic effusion. However there is no fever of leukocytosis. - For now, we'll cover with empiric antibiotics, unfortunately her INR is supratherapeutic which will prevent [place] from doing a thoracocentesis. Although she has worsening of her pleural effusions with elevated BNP level than the usual baseline, she does not appears significantly volume overloaded- therefore will change Lasix to 40 mg IV daily only. We will order a CT scan chest, to see if there is any underlying infiltrate which is not visible on the x-ray because of pleural effusions. She has already been started on empiric antibiotics in the emergency room-we will continue for now, however if she remains afebrile and if the CT of the chest does not show any significant consolidation, we will plan to discontinue. - Will need to consult cardiology in a.m., depending on CT scan chest results may need pulmonary consultation as well.  . Atrial fibrillation - INR is supratherapeutic, Coumadin will be placed on hold. We will continue amiodarone for now.  - We will notify/consult cardiology in a.m.   . Recent pacemaker implantation complicated by pocket hematoma - Still with some bruising and swelling over the pacemaker site. - Hold Coumadin, with consult  cardiology in a.m.  . Coagulopathy - To underlying Coumadin therapy-likely secondary to drug interaction with recently completed Levaquin therapy. - Hold Coumadin for now, and let INR drift down. If pleural effusion needs to be tapped-may need a small dose of vitamin K. The pacemaker pocket hematoma seems to be a chronic issue, patient claims that the swelling has actually decreased in size over the past 2 days, there is some mild bruising over the left breast area, however no overt bleeding is evident. - Monitor and follow INR.  . Diastolic heart failure - Apart from worsening of her pleural effusion, no clinical evidence of decompensation. - We will change Lasix to 40 mg IV daily for now, the weights, strict I&O. - Consult cardiology in a.m.  . Diabetes type 2, uncontrolled - Resume Lantus, place on sliding scale insulin. Hold metformin while inpatient.   . Depression - Continue with Celexa.   Further plan will depend as patient's clinical course evolves and further radiologic and laboratory data become available. Patient will be monitored closely.  Discussed the plan with, patient and daughter at bedside. They are agreeable to the above noted plan.   DVT Prophylaxis: Not needed - on Coumadin with supratherapeutic INR  Code Status: Full Code  Total time spent for admission equals 45 minutes.  [facility] Pager [phone]  If 7PM-7AM, please contact night-coverage [email or web address] Password [facility] 10/12/2012, 6:10 PM

## 2012-10-13 ENCOUNTER — Inpatient Hospital Stay (HOSPITAL_COMMUNITY): Payer: Medicare Other

## 2012-10-13 ENCOUNTER — Telehealth: Payer: Self-pay | Admitting: Internal Medicine

## 2012-10-13 DIAGNOSIS — T148XXA Other injury of unspecified body region, initial encounter: Secondary | ICD-10-CM

## 2012-10-13 LAB — COMPREHENSIVE METABOLIC PANEL
AST: 18 U/L (ref 0–37)
BUN: 23 mg/dL (ref 6–23)
CO2: 30 mEq/L (ref 19–32)
Calcium: 9.2 mg/dL (ref 8.4–10.5)
Creatinine, Ser: 0.99 mg/dL (ref 0.50–1.10)
GFR calc non Af Amer: 50 mL/min — ABNORMAL LOW (ref >90)

## 2012-10-13 LAB — CBC
HCT: 30.5 % — ABNORMAL LOW (ref 36.0–46.0)
MCH: 30.7 pg (ref 26.0–34.0)
MCV: 94.4 fL (ref 78.0–100.0)
Platelets: 187 10*3/uL (ref 150–400)
RDW: 15.8 % — ABNORMAL HIGH (ref 11.5–15.5)

## 2012-10-13 LAB — GLUCOSE, CAPILLARY
Glucose-Capillary: 39 mg/dL — CL (ref 70–99)
Glucose-Capillary: 55 mg/dL — ABNORMAL LOW (ref 70–99)
Glucose-Capillary: 75 mg/dL (ref 70–99)
Glucose-Capillary: 155 mg/dL — ABNORMAL HIGH (ref 70–99)

## 2012-10-13 LAB — PROTIME-INR
INR: 4.95 — ABNORMAL HIGH (ref 0.00–1.49)
Prothrombin Time: 44.1 seconds — ABNORMAL HIGH (ref 11.6–15.2)

## 2012-10-13 MED ORDER — ENSURE COMPLETE PO LIQD
237.0000 mL | ORAL | Status: DC
  Administered 2012-10-13 – 2012-10-16 (×4): 237 mL via ORAL

## 2012-10-13 MED ORDER — ADULT MULTIVITAMIN W/MINERALS CH
1.0000 | ORAL_TABLET | Freq: Every day | ORAL | Status: DC
  Administered 2012-10-13 – 2012-10-17 (×5): 1 via ORAL
  Filled 2012-10-13 (×5): qty 1

## 2012-10-13 MED ORDER — VITAMIN K1 10 MG/ML IJ SOLN
10.0000 mg | Freq: Once | INTRAVENOUS | Status: AC
  Administered 2012-10-13: 10 mg via INTRAVENOUS
  Filled 2012-10-13: qty 1

## 2012-10-13 NOTE — Progress Notes (Signed)
INITIAL NUTRITION ASSESSMENT  DOCUMENTATION CODES Per approved criteria  -Not Applicable   INTERVENTION: Add Ensure Complete po daily, each supplement provides 350 kcal and 13 grams of protein. MVI daily. RD to continue to follow nutrition care plan.  NUTRITION DIAGNOSIS: Inadequate oral intake related to poor appetite as evidenced by pt report.   Goal: Intake to meet >90% of estimated nutrition needs.  Monitor:  weight trends, lab trends, I/O's, PO intake, supplement tolerance  Reason for Assessment: Malnutrition Screening Tool  79 y.o. female  Admitting Dx: Acute respiratory failure  ASSESSMENT: Admitted with cough and exertional SOB x 2 weeks. Recent mitral valve replacement in February 2014.  Currently ordered for Carbohydrate Modified Medium diet. She states that she is not a breakfast eater. Ate well for lunch, consuming all of her chicken salad. She notes that her appetite has been poor since February, however per chart review, weight has been relatively stable. She notes that she drinks Ensure PTA and is amenable to receiving while here.  Height: Ht Readings from Last 1 Encounters:  10/12/12 5\' 4"  (1.626 m)    Weight: Wt Readings from Last 1 Encounters:  10/13/12 143 lb 3.2 oz (64.955 kg)    Ideal Body Weight: 120 lb  % Ideal Body Weight: 119%  Wt Readings from Last 10 Encounters:  10/13/12 143 lb 3.2 oz (64.955 kg)  10/09/12 136 lb 8 oz (61.916 kg)  09/22/12 143 lb 4.8 oz (65 kg)  09/22/12 143 lb 4.8 oz (65 kg)  09/14/12 137 lb 11.2 oz (62.46 kg)  09/01/12 144 lb (65.318 kg)  07/24/12 146 lb 12.8 oz (66.588 kg)  07/08/12 148 lb 12.8 oz (67.495 kg)  06/16/12 147 lb (66.679 kg)  06/12/12 149 lb 1.9 oz (67.64 kg)    Usual Body Weight: 145 lb  % Usual Body Weight: 99%  BMI:  Body mass index is 24.57 kg/(m^2). WNL  Estimated Nutritional Needs: Kcal: 1400 - 1600 Protein: 75 - 85 g Fluid: > 1.5 liters daily  Skin:  Stage I coccyx Chest  incision  Diet Order: Carb Control Medium  EDUCATION NEEDS: -No education needs identified at this time   Intake/Output Summary (Last 24 hours) at 10/13/12 1418 Last data filed at 10/13/12 1100  Gross per 24 hour  Intake    560 ml  Output   1500 ml  Net   -940 ml    Last BM: 7/19  Labs:   Recent Labs Lab 10/12/12 1537 10/13/12 0605  NA 134* 135  K 3.7 3.6  CL 97 98  CO2 24 30  BUN 23 23  CREATININE 1.23* 0.99  CALCIUM 9.0 9.2  GLUCOSE 91 42*    CBG (last 3)   Recent Labs  10/13/12 0700 10/13/12 0720 10/13/12 1058  GLUCAP 55* 75 155*    Scheduled Meds: . ipratropium  0.5 mg Nebulization TID   And  . albuterol  2.5 mg Nebulization TID  . amiodarone  200 mg Oral BID  . ceFEPime (MAXIPIME) IV  1 g Intravenous Q24H  . citalopram  20 mg Oral Daily  . docusate sodium  100 mg Oral BID  . furosemide  40 mg Intravenous Daily  . insulin aspart  0-9 Units Subcutaneous TID WC  . insulin glargine  18 Units Subcutaneous QHS  . magnesium oxide  400 mg Oral BID  . nystatin   Topical BID  . rOPINIRole  0.5 mg Oral QHS  . simvastatin  20 mg Oral q1800  . sodium  chloride  3 mL Intravenous Q12H  . sodium chloride  3 mL Intravenous Q12H  . traMADol  50 mg Oral QHS  . traZODone  50 mg Oral QHS  . vancomycin  750 mg Intravenous Q24H    Continuous Infusions:   Past Medical History  Diagnosis Date  . Hypertension   . Hyperlipidemia   . Type 2 diabetes mellitus   . Arthritis   . Coronary artery disease     a. Mild nonobstructive by cath 04/2012, cath complicated by groin hematoma.  . S/P mitral valve repair 05/20/2012    a. Severe MR - 04/2012: 26mm Sorin Memo 3D ring annuloplasty via right mini thoracotomy approach  . S/P Maze operation for atrial fibrillation 05/20/2012    a. 04/2012: Complete bi-atrial lesion set using cryothermy  . Atrial fibrillation     a. Diagnosed 04/2012 in the setting of severe MR. b. s/p TEE/successful DCCV 06/06/12.  Marland Kitchen PEA (Pulseless  electrical activity)     a. 04/2012 -> adm with afib, developed PEA, transferred to [facility] for prolonged hospitalization.   . Chronic diastolic CHF (congestive heart failure)   . Symptomatic bradycardia     a. s/p Medtronic pacemaker 08/2012 (pocket hematoma)  . Moderate to severe pulmonary hypertension   . Pleural effusion     a. s/p thoracentesis 08/2012.  Marland Kitchen Hypoxemia     a. During admission 08/2012 for for UTI, hypoxia, FTT and pleural effusion s/p thoracentesis with transudative findings.  . Failure to thrive     a. Admission 08/2012.    Past Surgical History  Procedure Laterality Date  . Abdominal hysterectomy    . Cholecystectomy    . Eye surgery    . Appendectomy    . Tee without cardioversion N/A 05/09/2012    Procedure: TRANSESOPHAGEAL ECHOCARDIOGRAM (TEE);  Surgeon: Dolores Patty, MD;  Location: [facility] ENDOSCOPY;  Service: Cardiovascular;  Laterality: N/A;  . Mitral valve repair Right 05/20/2012    Procedure: MINIMALLY INVASIVE MITRAL VALVE REPAIR (MVR);  Surgeon: Purcell Nails, MD;  Location: [facility] OR;  Service: Open Heart Surgery;  Laterality: Right;  . Minimally invasive maze procedure N/A 05/20/2012    Procedure: MINIMALLY INVASIVE MAZE PROCEDURE;  Surgeon: Purcell Nails, MD;  Location: [facility] OR;  Service: Open Heart Surgery;  Laterality: N/A;  . Intraoperative transesophageal echocardiogram N/A 05/20/2012    Procedure: INTRAOPERATIVE TRANSESOPHAGEAL ECHOCARDIOGRAM;  Surgeon: Purcell Nails, MD;  Location: [facility] OR;  Service: Open Heart Surgery;  Laterality: N/A;  . Tee without cardioversion N/A 06/06/2012    Procedure: TRANSESOPHAGEAL ECHOCARDIOGRAM (TEE);  Surgeon: Dolores Patty, MD;  Location: [facility] ENDOSCOPY;  Service: Cardiovascular;  Laterality: N/A;  . Cardioversion N/A 06/06/2012    Procedure: CARDIOVERSION;  Surgeon: Dolores Patty, MD;  Location: [facility] ENDOSCOPY;  Service: Cardiovascular;  Laterality: N/A;  . Pacemaker insertion  09/19/2012    MDT ADDRL1 dual chamber  pacemaker implanted by Dr Johney Frame for symptomatic bradycardia    Jarold Motto MS, RD, LDN Pager: [phone] After-hours pager: [phone]

## 2012-10-13 NOTE — Evaluation (Signed)
Physical Therapy Evaluation Patient Details Name: [name] [name] MRN: [id number] DOB: Oct 06, 1924 Today's Date: 10/13/2012 Time: 2130-8657 PT Time Calculation (min): 18 min  PT Assessment / Plan / Recommendation History of Present Illness  Pt adm with acute resp failure due to pleural effusion.  Pt with recent pacer placement with pocket hematoma.  Pt has been at [facility] for SNF.  Clinical Impression  Pt admitted with above. Pt currently with functional limitations due to the deficits listed below (see PT Problem List).  Pt will benefit from skilled PT to increase their independence and safety with mobility to allow discharge to the venue listed below.        PT Assessment  Patient needs continued PT services    Follow Up Recommendations  SNF    Does the patient have the potential to tolerate intense rehabilitation      Barriers to Discharge        Equipment Recommendations  None recommended by PT    Recommendations for Other Services     Frequency Min 3X/week    Precautions / Restrictions Precautions Precautions: Fall   Pertinent Vitals/Pain VSS      Mobility  Bed Mobility Bed Mobility: Supine to Sit;Sitting - Scoot to Edge of Bed Supine to Sit: 4: Min assist;HOB elevated Sitting - Scoot to [place] of Bed: 4: Min assist Details for Bed Mobility Assistance: Assist to bring trunk up and hips to EOB. Transfers Transfers: Sit to Stand;Stand to [employer] Transfers Sit to Stand: 4: Min assist;With upper extremity assist;With armrests;From bed;From chair/3-in-1 Stand to Sit: 4: Min assist;With upper extremity assist;With armrests;To chair/3-in-1 Stand Pivot Transfers: 4: Min assist Details for Transfer Assistance: Assist to bring hips up and for balance. Ambulation/Gait Ambulation/Gait Assistance: 4: Min assist Ambulation Distance (Feet): 175 Feet Assistive device: Rolling walker Ambulation/Gait Assistance Details: Verbal cues to fully extend knees initially. Gait  Pattern: Step-through pattern;Decreased stride length Gait velocity: decr    Exercises     PT Diagnosis: Generalized weakness;Difficulty walking  PT Problem List: Decreased strength;Decreased activity tolerance;Decreased balance;Decreased mobility;Decreased knowledge of use of DME PT Treatment Interventions: DME instruction;Gait training;Patient/family education;Functional mobility training;Therapeutic activities;Therapeutic exercise;Balance training     PT Goals(Current goals can be found in the care plan section) Acute Rehab PT Goals Patient Stated Goal: Eventually return home PT Goal Formulation: With patient Time For Goal Achievement: 10/20/12 Potential to Achieve Goals: Good  Visit Information  Last PT Received On: 10/13/12 Assistance Needed: +1 History of Present Illness: Pt adm with acute resp failure due to pleural effusion.  Pt with recent pacer placement with pocket hematoma.  Pt has been at [facility] for SNF.       Prior Functioning  Home Living Family/patient expects to be discharged to:: Skilled nursing facility Prior Function Level of Independence: Needs assistance Gait / Transfers Assistance Needed: Assist for amb Communication Communication: No difficulties    Cognition  Cognition Arousal/Alertness: Awake/alert Behavior During Therapy: WFL for tasks assessed/performed Overall Cognitive Status: Within Functional Limits for tasks assessed    Extremity/Trunk Assessment Upper Extremity Assessment Upper Extremity Assessment: Generalized weakness Lower Extremity Assessment Lower Extremity Assessment: Generalized weakness   Balance Balance Balance Assessed: Yes Static Standing Balance Static Standing - Balance Support: Bilateral upper extremity supported Static Standing - Level of Assistance: 4: Min assist  End of Session PT - End of Session Equipment Utilized During Treatment: Gait belt Activity Tolerance: Patient tolerated treatment well Patient left: in  chair;with call bell/phone within reach  GP  Syleena Mchan 10/13/2012, 3:37 PM  [facility] PT [phone]

## 2012-10-13 NOTE — Progress Notes (Signed)
Inpatient Diabetes Program Recommendations  AACE/ADA: New Consensus Statement on Inpatient Glycemic Control (2013)  Target Ranges:  Prepandial:   less than 140 mg/dL      Peak postprandial:   less than 180 mg/dL (1-2 hours)      Critically ill patients:  140 - 180 mg/dL   Results for [name] [name], [name] [name] (MRN [id number]) as of 10/13/2012 13:56  Ref. Range 10/12/2012 21:38 10/13/2012 06:37 10/13/2012 07:00 10/13/2012 07:20 10/13/2012 10:58  Glucose-Capillary Latest Range: 70-99 mg/dL 952 (H) 39 (LL) 55 (L) 75 155 (H)    Inpatient Diabetes Program Recommendations Insulin - Basal: Please decrease Lantus to 5 units QHS.  If hypoglycemic with decreased dose of Lantus, please discontinue Lantus while inpatient.  Note: Patient has a history of diabetes and takes Lantus 18 units QHS and Metformin 500 mg BID at home for diabetes management.  Currently, patient is ordered to receive  Lantus 18 units QHS and Novolog 0-9 units AC for inpatient glycemic control.  Last night at bedtime blood glucose was 119 mg/dl and patient received Lantus 18 units at 22:36.  Fasting lab glucose was 42 mg/dl at 8:41 am and fasting finger stick glucose was 39 mg/dl at 3:24 am.  According to the chart, patient has poor PO intake at this time.  Please consider decreasing Lantus to 5 units QHS and if pt is hypoglycemic on reduced recommended dose of Lantus, please discontinue Lantus while inpatient.  Will continue to follow.  Thanks, Orlando Penner, RN, MSN, CCRN Diabetes Coordinator Inpatient Diabetes Program [phone]

## 2012-10-13 NOTE — Telephone Encounter (Signed)
Pt has appt with brooke 10-16-12, have to move pt due to brooke having a death in the family, allred on vacation, can she wait until next week, brooke's next day is Thursday 7-31?

## 2012-10-13 NOTE — Progress Notes (Signed)
Patient rested quietly throught the night.  CT of chest scheduled for 8AM 7/21. No complaints of CP or SOB. Pacemaker site swollen and bruised.

## 2012-10-13 NOTE — Progress Notes (Addendum)
[facility] PROGRESS NOTE  [name] [name] JXB:[id number] DOB: Dec 30, 1924 DOA: 10/12/2012 PCP: Marylen Ponto, MD  Assessment/Plan: 1. Pleural effusion bilateral; patient with large bilateral pleural effusion will need to contact IR for therapeutic/diagnostic thoracentesis. Patient received vitamin K 10 mg secondary to supratherapeutic INR; although most likely not a parapneumonic effusion we'll continue antibiotics until paracentesis completed 2. Atrial fib; on exam patient was in regular rhythm and rate we'll restart Coumadin after paracentesis completed  3. Acute respiratory failure; most likely secondary to bilateral pleural effusion see #1 4. DM; currently controlled hemoglobin A1c on 09/08/2012= 5.4 Pacer placement; will contact cardiology in the a.m. and brief health care plan 5. CAP; continue vancomycin+ cefepime day 2/7     Code Status:Full Family Communication: Numerous family members were present for review of plan of care Disposition Plan:   Procedures:  CT chest without contrast 10/13/2012   1. Bilateral pleural effusions, right greater than left, with  minimal loculation inferiorly on the right.  2. Bilateral patchy collapse/consolidation, right greater than  left, likely due to a combination of pneumonia and atelectasis.   Antibiotics: vancomycin+ cefepime day 2/7   HPI/Subjective: [name] [name] is a 79 y.o. WF PMHx S/P minimally invasive mitral valve repair and Maze procedure 05/20/2012  of a recent permanent pacemaker implantation (on 09/19/2012 by Dr. Hillis Range) complicated by a pacemaker pocket hematoma, atrial fibrillation on chronic Coumadin therapy, mitral regurgitation status post mitral valve replacement in February of 2014, diabetes and who presents today with the above noted complaint. Patient has had prior pleural effusion in the past, per discharge summary on June, pleural fluid analysis was consistent with transudative pleural effusion likely secondary to  chronic diastolic heart failure. Patient subsequently was readmitted in late June and underwent a permanent pacemaker implantation. She was then discharged to a skilled nursing facility, she was slowly making progress, however approximately 2 weeks back she then started experiencing a dry hacking cough. She was placed on a week of Levaquin, which she just finished this past Wednesday. She continued to have cough, per her and her family she has functional decline and has had issues with generalized weakness. Patient claims that doing her regular activities of living also make her short of breath. She denies any shortness of breath at rest. Denies any chest pain. Denies any peripheral edema. Because of these ongoing symptoms, repeat chest x-ray was done at the skilled nursing facility today, which was read as worsening of her pleural effusions with questionable pneumonia. She was then transferred to the emergency room for further evaluation and treatment, I was subsequently called to admit this patient.  Patient denies any chest pain, fever, nausea, vomiting, or diarrhea. She denies any abdominal pain.   Objective: Filed Vitals:   10/12/12 2141 10/13/12 0027 10/13/12 0512 10/13/12 0827  BP: 124/44 114/39 105/38   Pulse: 75 71 68   Temp: 98.5 F (36.9 C)  98 F (36.7 C)   TempSrc: Oral  Oral   Resp: 20 18 18    Height:      Weight:   64.955 kg (143 lb 3.2 oz)   SpO2: 98% 96% 98% 98%    Intake/Output Summary (Last 24 hours) at 10/13/12 0857 Last data filed at 10/13/12 0532  Gross per 24 hour  Intake    320 ml  Output   1500 ml  Net  -1180 ml   Filed Weights   10/12/12 1643 10/12/12 1914 10/13/12 0512  Weight: 61.9 kg (136 lb 7.4 oz) 64.728 kg (  142 lb 11.2 oz) 64.955 kg (143 lb 3.2 oz)    Exam:   General:  Alert,NAD  Cardiovascular: Regular rhythm and rate, negative murmurs rubs or gallops, DP/PT pulses 2+ a lateral   Respiratory: While patient is sitting in chair no acute distress,  decreased breath sounds in all fields, negative rhonchi, no rales, no crackles  Abdomen: Soft nontender nondistended plus bowel sounds   Data Reviewed: Basic Metabolic Panel:  Recent Labs Lab 10/12/12 1537 10/13/12 0605  NA 134* 135  K 3.7 3.6  CL 97 98  CO2 24 30  GLUCOSE 91 42*  BUN 23 23  CREATININE 1.23* 0.99  CALCIUM 9.0 9.2   Liver Function Tests:  Recent Labs Lab 10/12/12 1537 10/13/12 0605  AST 18 18  ALT 11 11  ALKPHOS 75 75  BILITOT 0.7 0.7  PROT 5.8* 5.8*  ALBUMIN 2.9* 2.8*   No results found for this basename: LIPASE, AMYLASE,  in the last 168 hours No results found for this basename: AMMONIA,  in the last 168 hours CBC:  Recent Labs Lab 10/12/12 1537 10/13/12 0605  WBC 4.7 3.9*  NEUTROABS 3.6  --   HGB 9.8* 9.9*  HCT 29.4* 30.5*  MCV 94.2 94.4  PLT 183 187   Cardiac Enzymes:  Recent Labs Lab 10/12/12 1537  TROPONINI <0.30   BNP (last 3 results)  Recent Labs  05/13/12 0535 09/06/12 0302 10/12/12 1537  PROBNP 1125.0* 3823.0* 7410.0*   CBG:  Recent Labs Lab 10/12/12 2138 10/13/12 0637 10/13/12 0700 10/13/12 0720  GLUCAP 119* 39* 55* 75    Recent Results (from the past 240 hour(s))  MRSA PCR SCREENING     Status: None   Collection Time    10/12/12  7:22 PM      Result Value Range Status   MRSA by PCR NEGATIVE  NEGATIVE Final   Comment:            The GeneXpert MRSA Assay (FDA     approved for NASAL specimens     only), is one component of a     comprehensive MRSA colonization     surveillance program. It is not     intended to diagnose MRSA     infection nor to guide or     monitor treatment for     MRSA infections.     Studies: Dg Chest 2 View  10/12/2012   *RADIOLOGY REPORT*  Clinical Data: Short of breath.  CHEST - 2 VIEW  Comparison: 09/20/2012  Findings: Left chest wall pacer device is noted with lead in the right atrial appendage and right ventricle.  The heart size is normal.  There are bilateral pleural  effusions right greater than left.  Compared with previous exam the right pleural effusion has increased in the interval.  Atelectasis noted in the lung bases.  IMPRESSION:  1.  Bilateral pleural effusions, right greater than left. 2.  Interval increase in volume of right effusion.   Original Report Authenticated By: Signa Kell, M.D.    Scheduled Meds: . ipratropium  0.5 mg Nebulization TID   And  . albuterol  2.5 mg Nebulization TID  . amiodarone  200 mg Oral BID  . ceFEPime (MAXIPIME) IV  1 g Intravenous Q24H  . citalopram  20 mg Oral Daily  . docusate sodium  100 mg Oral BID  . furosemide  40 mg Intravenous Daily  . insulin aspart  0-9 Units Subcutaneous TID WC  . insulin glargine  18 Units Subcutaneous QHS  . magnesium oxide  400 mg Oral BID  . nystatin   Topical BID  . rOPINIRole  0.5 mg Oral QHS  . simvastatin  20 mg Oral q1800  . sodium chloride  3 mL Intravenous Q12H  . sodium chloride  3 mL Intravenous Q12H  . traMADol  50 mg Oral QHS  . traZODone  50 mg Oral QHS  . vancomycin  750 mg Intravenous Q24H   Continuous Infusions:   Principal Problem:   Acute respiratory failure Active Problems:   Diabetes type 2, uncontrolled   Atrial fibrillation   Pleural effusion   Depression    Time spent: 60 minutes    WOODS, CURTIS, J  [facility] Pager [phone]. If 7PM-7AM, please contact night-coverage at [email or web address], password [facility] 10/13/2012, 8:57 AM  LOS: 1 day

## 2012-10-13 NOTE — Progress Notes (Signed)
Utilization Review Completed Luismario Coston J. Fitzpatrick Alberico, RN, BSN, NCM [phone]  

## 2012-10-13 NOTE — Progress Notes (Signed)
Clinical Social Work Department BRIEF PSYCHOSOCIAL ASSESSMENT 10/13/2012  Patient:  [name] [name], [name] [name]     Account Number:  [id number]     Admit date:  10/12/2012  Clinical Social Worker:  Robin Searing  Date/Time:  10/13/2012 11:53 AM  Referred by:  Physician  Date Referred:  10/13/2012 Referred for  SNF Placement   Other Referral:   Interview type:  Family Other interview type:    PSYCHOSOCIAL DATA Living Status:  FAMILY Admitted from facility:  [facility], [place] Level of care:  Skilled Nursing Facility Primary support name:  DAUGHTER- SHEILA Primary support relationship to patient:  FAMILY Degree of support available:   GOOD    CURRENT CONCERNS Current Concerns  Post-Acute Placement   Other Concerns:    SOCIAL WORK ASSESSMENT / PLAN Patient admitted from [facility]- attempting to make contact with family and with SNF to determine d/c disposition.  .   Assessment/plan status:  Other - See comment Other assessment/ plan:   Will update FL2 for SNF at d/c and await word from SNF and family   Information/referral to community resources:   EMS  SNF    PATIENT'S/FAMILY'S RESPONSE TO PLAN OF CARE: Pending return calls.  Reece Levy, MSW [phone]

## 2012-10-14 DIAGNOSIS — I5031 Acute diastolic (congestive) heart failure: Secondary | ICD-10-CM

## 2012-10-14 LAB — CBC WITH DIFFERENTIAL/PLATELET
Eosinophils Absolute: 0.1 10*3/uL (ref 0.0–0.7)
Hemoglobin: 9.9 g/dL — ABNORMAL LOW (ref 12.0–15.0)
Lymphocytes Relative: 21 % (ref 12–46)
Lymphs Abs: 0.8 10*3/uL (ref 0.7–4.0)
MCH: 30.7 pg (ref 26.0–34.0)
Monocytes Relative: 13 % — ABNORMAL HIGH (ref 3–12)
Neutro Abs: 2.4 10*3/uL (ref 1.7–7.7)
Neutrophils Relative %: 63 % (ref 43–77)
Platelets: 181 10*3/uL (ref 150–400)
RBC: 3.22 MIL/uL — ABNORMAL LOW (ref 3.87–5.11)
WBC: 3.8 10*3/uL — ABNORMAL LOW (ref 4.0–10.5)

## 2012-10-14 LAB — GLUCOSE, CAPILLARY
Glucose-Capillary: 78 mg/dL (ref 70–99)
Glucose-Capillary: 111 mg/dL — ABNORMAL HIGH (ref 70–99)

## 2012-10-14 LAB — COMPREHENSIVE METABOLIC PANEL
ALT: 10 U/L (ref 0–35)
Albumin: 2.7 g/dL — ABNORMAL LOW (ref 3.5–5.2)
Alkaline Phosphatase: 73 U/L (ref 39–117)
Chloride: 95 mEq/L — ABNORMAL LOW (ref 96–112)
GFR calc Af Amer: 48 mL/min — ABNORMAL LOW (ref >90)
Glucose, Bld: 92 mg/dL (ref 70–99)
Potassium: 3.9 mEq/L (ref 3.5–5.1)
Sodium: 133 mEq/L — ABNORMAL LOW (ref 135–145)
Total Bilirubin: 0.8 mg/dL (ref 0.3–1.2)
Total Protein: 5.5 g/dL — ABNORMAL LOW (ref 6.0–8.3)

## 2012-10-14 LAB — PROTIME-INR
INR: 1.33 (ref 0.00–1.49)
Prothrombin Time: 16.2 seconds — ABNORMAL HIGH (ref 11.6–15.2)

## 2012-10-14 NOTE — Progress Notes (Signed)
[facility] PROGRESS NOTE  [name] [name] ZOX:[id number] DOB: December 18, 1924 DOA: 10/12/2012 PCP: Marylen Ponto, MD  Assessment/Plan: 1. Pleural effusion bilateral; patient with large bilateral pleural effusion will need to contact IR for therapeutic/diagnostic thoracentesis. Patient received vitamin K 10 mg secondary to supratherapeutic INR;  today's INR = 1.33, although most likely not a parapneumonic effusion we'll continue antibiotics until paracentesis completed 2. Atrial fib; on exam patient was in regular rhythm and rate we'll restart Coumadin after paracentesis completed  3. Acute respiratory failure; most likely secondary to bilateral pleural effusion see #1 4. DM; currently controlled hemoglobin A1c on 09/08/2012= 5.4 Pacer placement; will contact cardiology in the a.m. and brief health care plan 5. CAP; continue vancomycin+ cefepime day 2/7     Code Status:Full Family Communication: Numerous family members were present for review of plan of care Disposition Plan:   Procedures:  CT chest without contrast 10/13/2012   1. Bilateral pleural effusions, right greater than left, with  minimal loculation inferiorly on the right.  2. Bilateral patchy collapse/consolidation, right greater than  left, likely due to a combination of pneumonia and atelectasis.   Antibiotics: vancomycin+ cefepime day 2/7   HPI/Subjective: [name] [name] is a 79 y.o. WF PMHx S/P minimally invasive mitral valve repair and Maze procedure 05/20/2012  of a recent permanent pacemaker implantation (on 09/19/2012 by Dr. Hillis Range) complicated by a pacemaker pocket hematoma, atrial fibrillation on chronic Coumadin therapy, mitral regurgitation status post mitral valve replacement in February of 2014, diabetes and who presents today with the above noted complaint. Patient has had prior pleural effusion in the past, per discharge summary on June, pleural fluid analysis was consistent with transudative pleural  effusion likely secondary to chronic diastolic heart failure. Patient subsequently was readmitted in late June and underwent a permanent pacemaker implantation. She was then discharged to a skilled nursing facility, she was slowly making progress, however approximately 2 weeks back she then started experiencing a dry hacking cough. She was placed on a week of Levaquin, which she just finished this past Wednesday. She continued to have cough, per her and her family she has functional decline and has had issues with generalized weakness. Patient claims that doing her regular activities of living also make her short of breath. She denies any shortness of breath at rest. Denies any chest pain. Denies any peripheral edema. Because of these ongoing symptoms, repeat chest x-ray was done at the skilled nursing facility today, which was read as worsening of her pleural effusions with questionable pneumonia. She was then transferred to the emergency room for further evaluation and treatment, I was subsequently called to admit this patient.  Patient denies any chest pain, fever, nausea, vomiting, or diarrhea. She denies any abdominal pain. States has signed the consent form   Objective: Filed Vitals:   10/14/12 0854 10/14/12 1050 10/14/12 1300 10/14/12 1508  BP:  124/45 133/49   Pulse:  73 70   Temp:  98.6 F (37 C) 98.1 F (36.7 C)   TempSrc:  Oral Oral   Resp:  18 18   Height:      Weight:      SpO2: 92% 93% 95% 87%    Intake/Output Summary (Last 24 hours) at 10/14/12 1512 Last data filed at 10/14/12 1300  Gross per 24 hour  Intake    890 ml  Output    950 ml  Net    -60 ml   Filed Weights   10/12/12 1914 10/13/12 0512 10/14/12 0449  Weight: 64.728 kg (142 lb 11.2 oz) 64.955 kg (143 lb 3.2 oz) 64 kg (141 lb 1.5 oz)    Exam:   General:  Alert,NAD  Cardiovascular: Regular rhythm and rate, negative murmurs rubs or gallops, DP/PT pulses 2+ a lateral   Respiratory: patient is laying on her  bed no acute distress, decreased breath sounds left lower lung field (patient is lying on her left side) negative rhonchi, no rales, no crackles and the remainder of the lung fields  Abdomen: Soft nontender nondistended plus bowel sounds   Data Reviewed: Basic Metabolic Panel:  Recent Labs Lab 10/12/12 1537 10/13/12 0605 10/14/12 0940  NA 134* 135 133*  K 3.7 3.6 3.9  CL 97 98 95*  CO2 24 30 30   GLUCOSE 91 42* 92  BUN 23 23 25*  CREATININE 1.23* 0.99 1.15*  CALCIUM 9.0 9.2 9.1  MG  --   --  1.9   Liver Function Tests:  Recent Labs Lab 10/12/12 1537 10/13/12 0605 10/14/12 0940  AST 18 18 17   ALT 11 11 10   ALKPHOS 75 75 73  BILITOT 0.7 0.7 0.8  PROT 5.8* 5.8* 5.5*  ALBUMIN 2.9* 2.8* 2.7*   No results found for this basename: LIPASE, AMYLASE,  in the last 168 hours No results found for this basename: AMMONIA,  in the last 168 hours CBC:  Recent Labs Lab 10/12/12 1537 10/13/12 0605 10/14/12 0940  WBC 4.7 3.9* 3.8*  NEUTROABS 3.6  --  2.4  HGB 9.8* 9.9* 9.9*  HCT 29.4* 30.5* 30.4*  MCV 94.2 94.4 94.4  PLT 183 187 181   Cardiac Enzymes:  Recent Labs Lab 10/12/12 1537  TROPONINI <0.30   BNP (last 3 results)  Recent Labs  05/13/12 0535 09/06/12 0302 10/12/12 1537  PROBNP 1125.0* 3823.0* 7410.0*   CBG:  Recent Labs Lab 10/13/12 1058 10/13/12 1608 10/13/12 2115 10/14/12 0656 10/14/12 1116  GLUCAP 155* 83 213* 78 128*    Recent Results (from the past 240 hour(s))  CULTURE, BLOOD (ROUTINE X 2)     Status: None   Collection Time    10/12/12  3:35 PM      Result Value Range Status   Specimen Description BLOOD RIGHT ARM   Final   Special Requests BOTTLES DRAWN AEROBIC ONLY 3.5CC   Final   Culture  Setup Time 10/12/2012 20:47   Final   Culture     Final   Value:        BLOOD CULTURE RECEIVED NO GROWTH TO DATE CULTURE WILL BE HELD FOR 5 DAYS BEFORE ISSUING A FINAL NEGATIVE REPORT   Report Status PENDING   Incomplete  CULTURE, BLOOD (ROUTINE X  2)     Status: None   Collection Time    10/12/12  3:45 PM      Result Value Range Status   Specimen Description BLOOD RIGHT ARM   Final   Special Requests BOTTLES DRAWN AEROBIC ONLY 4CC   Final   Culture  Setup Time 10/12/2012 20:47   Final   Culture     Final   Value:        BLOOD CULTURE RECEIVED NO GROWTH TO DATE CULTURE WILL BE HELD FOR 5 DAYS BEFORE ISSUING A FINAL NEGATIVE REPORT   Report Status PENDING   Incomplete  MRSA PCR SCREENING     Status: None   Collection Time    10/12/12  7:22 PM      Result Value Range Status   MRSA by  PCR NEGATIVE  NEGATIVE Final   Comment:            The GeneXpert MRSA Assay (FDA     approved for NASAL specimens     only), is one component of a     comprehensive MRSA colonization     surveillance program. It is not     intended to diagnose MRSA     infection nor to guide or     monitor treatment for     MRSA infections.     Studies: Dg Chest 2 View  10/12/2012   *RADIOLOGY REPORT*  Clinical Data: Short of breath.  CHEST - 2 VIEW  Comparison: 09/20/2012  Findings: Left chest wall pacer device is noted with lead in the right atrial appendage and right ventricle.  The heart size is normal.  There are bilateral pleural effusions right greater than left.  Compared with previous exam the right pleural effusion has increased in the interval.  Atelectasis noted in the lung bases.  IMPRESSION:  1.  Bilateral pleural effusions, right greater than left. 2.  Interval increase in volume of right effusion.   Original Report Authenticated By: Signa Kell, M.D.   Ct Chest Wo Contrast  10/13/2012   *RADIOLOGY REPORT*  Clinical Data:   Question pneumonia versus pleural effusion.  CT CHEST WITHOUT CONTRAST  Technique:  Multidetector CT imaging of the chest was performed following the standard protocol without IV contrast.  Comparison: Chest radiograph 10/12/2012.  Findings: Mediastinal lymph nodes are not enlarged by CT size criteria.  Hilar regions are difficult  to definitively evaluate without IV contrast.  No axillary adenopathy.  Atherosclerotic calcification of the arterial vasculature, including coronary arteries.  Pulmonary arteries and heart are enlarged.  No pericardial effusion.  Pacemaker lead tips terminate in the right atrium and right ventricle.  Mitral valve replacement.  There are bilateral pleural effusions, moderate on the right and small on the left.  Right pleural effusion is minimally loculated anterolaterally, in the inferior right hemithorax.  Associated collapse/consolidation in the right upper, right middle and right lower lobes.  Collapse/consolidation is worst in the right lower lobe.  Mild patchy collapse/consolidation in the left lower lobe. Upper lobe predominant ground-glass may be due in part to expiratory phase imaging.  Airway is otherwise unremarkable.  Incidental imaging of the upper abdomen shows no acute findings. No worrisome lytic or sclerotic lesions.  Degenerative changes are seen in the spine and shoulders.  IMPRESSION:  1.  Bilateral pleural effusions, right greater than left, with minimal loculation inferiorly on the right. 2.  Bilateral patchy collapse/consolidation, right greater than left, likely due to a combination of pneumonia and atelectasis.   Original Report Authenticated By: Leanna Battles, M.D.    Scheduled Meds: . ipratropium  0.5 mg Nebulization TID   And  . albuterol  2.5 mg Nebulization TID  . amiodarone  200 mg Oral BID  . ceFEPime (MAXIPIME) IV  1 g Intravenous Q24H  . citalopram  20 mg Oral Daily  . docusate sodium  100 mg Oral BID  . feeding supplement  237 mL Oral Q24H  . furosemide  40 mg Intravenous Daily  . insulin aspart  0-9 Units Subcutaneous TID WC  . insulin glargine  18 Units Subcutaneous QHS  . magnesium oxide  400 mg Oral BID  . multivitamin with minerals  1 tablet Oral Daily  . nystatin   Topical BID  . rOPINIRole  0.5 mg Oral QHS  . simvastatin  20 mg Oral q1800  . sodium  chloride  3 mL Intravenous Q12H  . sodium chloride  3 mL Intravenous Q12H  . traMADol  50 mg Oral QHS  . traZODone  50 mg Oral QHS  . vancomycin  750 mg Intravenous Q24H   Continuous Infusions:   Principal Problem:   Acute respiratory failure Active Problems:   Diabetes type 2, uncontrolled   Atrial fibrillation   Pleural effusion   Depression    Time spent: 30 minutes    Matvey Llanas, J  [facility] Pager [phone]. If 7PM-7AM, please contact night-coverage at [email or web address], password [facility] 10/14/2012, 3:12 PM  LOS: 2 days

## 2012-10-15 ENCOUNTER — Inpatient Hospital Stay (HOSPITAL_COMMUNITY): Payer: Medicare Other

## 2012-10-15 DIAGNOSIS — I5033 Acute on chronic diastolic (congestive) heart failure: Principal | ICD-10-CM

## 2012-10-15 DIAGNOSIS — I4891 Unspecified atrial fibrillation: Secondary | ICD-10-CM

## 2012-10-15 LAB — PROTEIN, BODY FLUID: Total protein, fluid: <0.2 g/dL

## 2012-10-15 LAB — GLUCOSE, CAPILLARY
Glucose-Capillary: 63 mg/dL — ABNORMAL LOW (ref 70–99)
Glucose-Capillary: 115 mg/dL — ABNORMAL HIGH (ref 70–99)
Glucose-Capillary: 156 mg/dL — ABNORMAL HIGH (ref 70–99)
Glucose-Capillary: 175 mg/dL — ABNORMAL HIGH (ref 70–99)

## 2012-10-15 LAB — BODY FLUID CELL COUNT WITH DIFFERENTIAL: Monocyte-Macrophage-Serous Fluid: 37 % — ABNORMAL LOW (ref 50–90)

## 2012-10-15 MED ORDER — INSULIN GLARGINE 100 UNIT/ML ~~LOC~~ SOLN
15.0000 [IU] | Freq: Every day | SUBCUTANEOUS | Status: DC
  Administered 2012-10-15 – 2012-10-16 (×2): 15 [IU] via SUBCUTANEOUS
  Filled 2012-10-15 (×3): qty 0.15

## 2012-10-15 NOTE — Progress Notes (Signed)
ANTIBIOTIC CONSULT NOTE   Pharmacy Consult for Vancomycin/Cefepime Indication: pneumonia  Allergies  Allergen Reactions  . Codeine Nausea And Vomiting    Patient Measurements: Height: 5\' 4"  (162.6 cm) Weight: 138 lb 14.4 oz (63.005 kg) (Scale B) IBW/kg (Calculated) : 54.7  Vital Signs: Temp: 98.3 F (36.8 C) (07/23 0520) Temp src: Oral (07/23 0520) BP: 104/45 mmHg (07/23 0520) Pulse Rate: 66 (07/23 0520) Intake/Output from previous day: 07/22 0701 - 07/23 0700 In: 843 [P.O.:840; I.V.:3] Out: 850 [Urine:850] Intake/Output from this shift: Total I/O In: 240 [P.O.:240] Out: 650 [Urine:650]  Labs:  Recent Labs  10/12/12 1537 10/13/12 0605 10/14/12 0940  WBC 4.7 3.9* 3.8*  HGB 9.8* 9.9* 9.9*  PLT 183 187 181  CREATININE 1.23* 0.99 1.15*   Estimated Creatinine Clearance: 30.3 ml/min (by C-G formula based on Cr of 1.15). No results found for this basename: VANCOTROUGH, Leodis Binet, VANCORANDOM, GENTTROUGH, GENTPEAK, GENTRANDOM, TOBRATROUGH, TOBRAPEAK, TOBRARND, AMIKACINPEAK, AMIKACINTROU, AMIKACIN,  in the last 72 hours   Microbiology: Recent Results (from the past 720 hour(s))  CULTURE, BLOOD (ROUTINE X 2)     Status: None   Collection Time    10/12/12  3:35 PM      Result Value Range Status   Specimen Description BLOOD RIGHT ARM   Final   Special Requests BOTTLES DRAWN AEROBIC ONLY 3.5CC   Final   Culture  Setup Time 10/12/2012 20:47   Final   Culture     Final   Value:        BLOOD CULTURE RECEIVED NO GROWTH TO DATE CULTURE WILL BE HELD FOR 5 DAYS BEFORE ISSUING A FINAL NEGATIVE REPORT   Report Status PENDING   Incomplete  CULTURE, BLOOD (ROUTINE X 2)     Status: None   Collection Time    10/12/12  3:45 PM      Result Value Range Status   Specimen Description BLOOD RIGHT ARM   Final   Special Requests BOTTLES DRAWN AEROBIC ONLY 4CC   Final   Culture  Setup Time 10/12/2012 20:47   Final   Culture     Final   Value:        BLOOD CULTURE RECEIVED NO GROWTH TO  DATE CULTURE WILL BE HELD FOR 5 DAYS BEFORE ISSUING A FINAL NEGATIVE REPORT   Report Status PENDING   Incomplete  MRSA PCR SCREENING     Status: None   Collection Time    10/12/12  7:22 PM      Result Value Range Status   MRSA by PCR NEGATIVE  NEGATIVE Final   Comment:            The GeneXpert MRSA Assay (FDA     approved for NASAL specimens     only), is one component of a     comprehensive MRSA colonization     surveillance program. It is not     intended to diagnose MRSA     infection nor to guide or     monitor treatment for     MRSA infections.    Medical History: Past Medical History  Diagnosis Date  . Hypertension   . Hyperlipidemia   . Type 2 diabetes mellitus   . Arthritis   . Coronary artery disease     a. Mild nonobstructive by cath 04/2012, cath complicated by groin hematoma.  . S/P mitral valve repair 05/20/2012    a. Severe MR - 04/2012: 26mm Sorin Memo 3D ring annuloplasty via right mini thoracotomy approach  .  S/P Maze operation for atrial fibrillation 05/20/2012    a. 04/2012: Complete bi-atrial lesion set using cryothermy  . Atrial fibrillation     a. Diagnosed 04/2012 in the setting of severe MR. b. s/p TEE/successful DCCV 06/06/12.  Marland Kitchen PEA (Pulseless electrical activity)     a. 04/2012 -> adm with afib, developed PEA, transferred to [facility] for prolonged hospitalization.   . Chronic diastolic CHF (congestive heart failure)   . Symptomatic bradycardia     a. s/p Medtronic pacemaker 08/2012 (pocket hematoma)  . Moderate to severe pulmonary hypertension   . Pleural effusion     a. s/p thoracentesis 08/2012.  Marland Kitchen Hypoxemia     a. During admission 08/2012 for for UTI, hypoxia, FTT and pleural effusion s/p thoracentesis with transudative findings.  . Failure to thrive     a. Admission 08/2012.    Medications:  Prescriptions prior to admission  Medication Sig Dispense Refill  . amiodarone (PACERONE) 200 MG tablet Take 1 tablet (200 mg total) by mouth 2 (two) times daily.   60 tablet  3  . citalopram (CELEXA) 20 MG tablet Take 20 mg by mouth daily.      . furosemide (LASIX) 40 MG tablet Take 1 tablet (40 mg total) by mouth daily.  30 tablet    . insulin glargine (LANTUS) 100 UNIT/ML injection Inject 18 Units into the skin at bedtime.      Marland Kitchen ipratropium-albuterol (DUONEB) 0.5-2.5 (3) MG/3ML SOLN Take 3 mLs by nebulization 4 (four) times daily as needed.      . magnesium oxide (MAG-OX) 400 MG tablet Take 1 tablet (400 mg total) by mouth 2 (two) times daily.  60 tablet  6  . metFORMIN (GLUCOPHAGE) 500 MG tablet Take 500 mg by mouth 2 (two) times daily with a meal.      . [EXPIRED] nystatin (MYCOSTATIN/NYSTOP) 100000 UNIT/GM POWD Apply topically 2 (two) times daily. Under breast      . ondansetron (ZOFRAN) 4 MG tablet Take 4 mg by mouth every 4 (four) hours as needed for nausea.      . pravastatin (PRAVACHOL) 40 MG tablet Take 40 mg by mouth at bedtime.      Marland Kitchen rOPINIRole (REQUIP) 0.5 MG tablet Take 0.5 mg by mouth at bedtime.      . traMADol (ULTRAM) 50 MG tablet Take 50 mg by mouth at bedtime.      . traZODone (DESYREL) 50 MG tablet Take 50 mg by mouth at bedtime.      Marland Kitchen warfarin (COUMADIN) 3 MG tablet Take 3 mg by mouth daily.       Scheduled:  Assessment: 79 y/o female patient admitted with shortness of breath, found to have bilateral pneumonia requiring broad spectrum antibiotics for HCAP.   Goal of Therapy:  Vancomycin trough level 15-20 mcg/ml  Plan:  Vancomycin 750mg  IV q24 and cefepime 1g IV q24, will monitor renal function. Measure antibiotic drug levels at steady state Follow up culture results  Talbert Cage, PharmD Pager [phone] 10/15/2012,1:16 PM

## 2012-10-15 NOTE — Procedures (Signed)
US guided diagnostic/therapeutic right thoracentesis performed yielding 680 cc's yellow fluid. F/u CXR pending. A portion of the fluid was sent to the lab for preordered studies. No immediate complications.

## 2012-10-15 NOTE — Progress Notes (Signed)
Physical Therapy Treatment Patient Details Name: [name] [name] MRN: [id number] DOB: 1925/01/11 Today's Date: 10/15/2012 Time: 7829-5621 PT Time Calculation (min): 28 min  PT Assessment / Plan / Recommendation  History of Present Illness Pt adm with acute resp failure due to pleural effusion.  Pt with recent pacer placement with pocket hematoma.  Pt has been at [facility] for SNF.   Clinical Impression    PT Comments   Pt feeling alittle better and eager to get back to [facility].  Amb pt in hallway twice on RA sats avg 94%.   Follow Up Recommendations  SNF  Return to [facility]   Does the patient have the potential to tolerate intense rehabilitation     Barriers to Discharge        Equipment Recommendations  None recommended by PT    Recommendations for Other Services    Frequency Min 3X/week   Progress towards PT Goals Progress towards PT goals: Progressing toward goals  Plan      Precautions / Restrictions Precautions Precautions: Fall    Pertinent Vitals/Pain No c/o pain    Mobility  Bed Mobility Bed Mobility: Not assessed Details for Bed Mobility Assistance: Pt OOB in recliner Transfers Transfers: Sit to Stand;Stand to Sit Sit to Stand: 3: Mod assist;From chair/3-in-1 Stand to Sit: 4: Min assist;3: Mod assist;To chair/3-in-1 Details for Transfer Assistance: Extra assist with sit to stand 2nd "bad shoulder" R UE.  Assist with stand to sit to control decend.  Ambulation/Gait Ambulation/Gait Assistance: 4: Min assist Ambulation Distance (Feet): 250 Feet (125' x 2 one sitting rest break) Assistive device: Rolling walker Ambulation/Gait Assistance Details: increased time and mild unsteadyness with biggest c/o being pt's R shoulder.  Amb on RA sats avg 94% and HR 78.   Gait Pattern: Step-through pattern;Decreased stride length Gait velocity: decr     PT Goals (current goals can now be found in the care plan section)    Visit Information  Last PT Received On:  10/15/12 Assistance Needed: +1 History of Present Illness: Pt adm with acute resp failure due to pleural effusion.  Pt with recent pacer placement with pocket hematoma.  Pt has been at [facility] for SNF.    Subjective Data      Cognition       Balance     End of Session PT - End of Session Equipment Utilized During Treatment: Gait belt Activity Tolerance: Patient tolerated treatment well Patient left: in chair;with call bell/phone within reach   Felecia Shelling  PTA [facility]  Acute  Rehab Pager      [phone]

## 2012-10-15 NOTE — Progress Notes (Signed)
The patient's CBG was 63 this morning.  Orange juice and graham crackers were given to the patient.  The CBG recheck was 115.

## 2012-10-15 NOTE — Progress Notes (Signed)
Inpatient Diabetes Program Recommendations  AACE/ADA: New Consensus Statement on Inpatient Glycemic Control (2013)  Target Ranges:  Prepandial:   less than 140 mg/dL      Peak postprandial:   less than 180 mg/dL (1-2 hours)      Critically ill patients:  140 - 180 mg/dL    Results for CHETARA, KROPP (MRN [id number]) as of 10/15/2012 11:18  Ref. Range 10/14/2012 06:56 10/14/2012 11:16 10/14/2012 15:46 10/14/2012 20:45 10/15/2012 06:44 10/15/2012 07:50  Glucose-Capillary Latest Range: 70-99 mg/dL 78 409 (H) 811 (H) 914 (H) 63 (L) 115 (H)   Inpatient Diabetes Program Recommendations Insulin - Basal: Please consider decreasing Lantus to 16 units QHS to prevent reoccurrence of hypoglycemia.  Note: According to the chart, patient only ate about 25% of all meals on 10/14/12.  Noted blood glucose of 63 mg/dl this morning on labs.  Please consider decreasing Lantus from 18 units QHS to 16 units QHS to prevent hypoglycemic events from occuring.  Will continue to follow.  Thanks, Orlando Penner, RN, MSN, CCRN Diabetes Coordinator Inpatient Diabetes Program [phone]

## 2012-10-15 NOTE — Progress Notes (Signed)
The patient stated that she slept well last night and didn't have any complaints this morning.

## 2012-10-15 NOTE — Consult Note (Signed)
CARDIOLOGY CONSULT NOTE    Patient ID: [name] [name] MRN: [id number] DOB/AGE: June 05, 1924 79 y.o.  Admit date: 10/12/2012  Primary Physician   Marylen Ponto, MD Primary Cardiologist   [place] Reason for Consultation  Exertional SOB, known history of CHF  HPI: [name] is a 79 y.o. 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 She has been having SOB for a couple weeks now and says it is worse when she is changing and walking, only after a short distance. She says she had a CXR done in her nursing home ([facility]) that was thought to be a pneumonia and she was placed on antibiotics at that time, but she denies any recent fever, chills or cough productive of sputum. She denies any associated chest pain, lightheadedness, LOC, diaphoresis, orthopnea, or PND. She also had some episodes of palpitations last week, and her heart rate was found to be in the 100's, along w/ orthostatic hypotension, causing significant dizziness and weakness. At that time she went to Dr. Kandis Mannan clinic and had her ICD interrogated which showed that she was converting back and forth from a-fib to SR. She was placed back on amiodarone 200 mg bid at that time, according to her recent clinic note.  The patient has an extensive cardiac history and presented to [facility] in early February after PEA arrest and then found to have A-fib w/ RVR. She was re-hospitalized on 09/05/12-09/14/12 for UTI, hypoxia, failure to thrive, and pleural effusions s/p thoracentesis w/ transudative findings. She was again hospitalized on 09/19/12 after LOC suddenly, thought  to be d/t bradyarrhythmia at that time, as she had a heart rate as low as 26 in the ED during that admission and had been off her rate controlling medications since her previous admission earlier in the month. She had a PPM (dual-chamber Medtronic) placed by Dr. Johney Frame at that time, and the patient was discharged on 09/22/12.   Past Medical History  Diagnosis Date  . Hypertension   . Hyperlipidemia   . Type 2 diabetes mellitus   . Arthritis   . Coronary artery disease     a. Mild nonobstructive by cath 04/2012, cath complicated by groin hematoma.  . S/P mitral valve repair 05/20/2012    a. Severe MR - 04/2012: 26mm Sorin Memo 3D ring annuloplasty via right mini thoracotomy approach  . S/P Maze operation for atrial fibrillation 05/20/2012    a. 04/2012: Complete bi-atrial lesion set using cryothermy  . Atrial fibrillation     a. Diagnosed 04/2012 in the setting of severe MR. b. s/p TEE/successful DCCV 06/06/12.  Marland Kitchen PEA (Pulseless electrical activity)     a. 04/2012 -> adm with afib, developed PEA, transferred to [facility] for prolonged hospitalization.   . Chronic diastolic CHF (congestive heart failure)   . Symptomatic bradycardia     a. s/p Medtronic pacemaker 08/2012 (pocket hematoma)  . Moderate to severe pulmonary hypertension   . Pleural effusion     a. s/p thoracentesis 08/2012.  Marland Kitchen Hypoxemia     a. During admission 08/2012 for for UTI, hypoxia, FTT and pleural effusion s/p thoracentesis with transudative findings.  . Failure to thrive  a. Admission 08/2012.     Past Surgical History  Procedure Laterality Date  . Abdominal hysterectomy    . Cholecystectomy    . Eye surgery    . Appendectomy    . Tee without cardioversion N/A 05/09/2012    Procedure: TRANSESOPHAGEAL ECHOCARDIOGRAM (TEE);  Surgeon: Dolores Patty, MD;  Location: [facility] ENDOSCOPY;  Service: Cardiovascular;  Laterality: N/A;  . Mitral valve repair Right 05/20/2012    Procedure: MINIMALLY INVASIVE MITRAL VALVE REPAIR (MVR);   Surgeon: Purcell Nails, MD;  Location: [facility] OR;  Service: Open Heart Surgery;  Laterality: Right;  . Minimally invasive maze procedure N/A 05/20/2012    Procedure: MINIMALLY INVASIVE MAZE PROCEDURE;  Surgeon: Purcell Nails, MD;  Location: [facility] OR;  Service: Open Heart Surgery;  Laterality: N/A;  . Intraoperative transesophageal echocardiogram N/A 05/20/2012    Procedure: INTRAOPERATIVE TRANSESOPHAGEAL ECHOCARDIOGRAM;  Surgeon: Purcell Nails, MD;  Location: [facility] OR;  Service: Open Heart Surgery;  Laterality: N/A;  . Tee without cardioversion N/A 06/06/2012    Procedure: TRANSESOPHAGEAL ECHOCARDIOGRAM (TEE);  Surgeon: Dolores Patty, MD;  Location: [facility] ENDOSCOPY;  Service: Cardiovascular;  Laterality: N/A;  . Cardioversion N/A 06/06/2012    Procedure: CARDIOVERSION;  Surgeon: Dolores Patty, MD;  Location: [facility] ENDOSCOPY;  Service: Cardiovascular;  Laterality: N/A;  . Pacemaker insertion  09/19/2012    MDT ADDRL1 dual chamber pacemaker implanted by Dr Johney Frame for symptomatic bradycardia    Allergies  Allergen Reactions  . Codeine Nausea And Vomiting    I have reviewed the patient's current medications  . ipratropium  0.5 mg Nebulization TID   And  . albuterol  2.5 mg Nebulization TID  . amiodarone  200 mg Oral BID  . ceFEPime (MAXIPIME) IV  1 g Intravenous Q24H  . citalopram  20 mg Oral Daily  . docusate sodium  100 mg Oral BID  . feeding supplement  237 mL Oral Q24H  . furosemide  40 mg Intravenous Daily  . insulin aspart  0-9 Units Subcutaneous TID WC  . insulin glargine  15 Units Subcutaneous QHS  . magnesium oxide  400 mg Oral BID  . multivitamin with minerals  1 tablet Oral Daily  . nystatin   Topical BID  . rOPINIRole  0.5 mg Oral QHS  . simvastatin  20 mg Oral q1800  . sodium chloride  3 mL Intravenous Q12H  . sodium chloride  3 mL Intravenous Q12H  . traMADol  50 mg Oral QHS  . traZODone  50 mg Oral QHS  . vancomycin  750 mg Intravenous Q24H     sodium chloride,  acetaminophen, acetaminophen, albuterol, alum & mag hydroxide-simeth, guaifenesin, ondansetron (ZOFRAN) IV, ondansetron, sodium chloride  Prior to Admission medications   Medication Sig Start Date End Date Taking? Authorizing Provider  amiodarone (PACERONE) 200 MG tablet Take 1 tablet (200 mg total) by mouth 2 (two) times daily. 10/09/12  Yes Aundria Rud, NP  citalopram (CELEXA) 20 MG tablet Take 20 mg by mouth daily.   Yes Historical Provider, MD  furosemide (LASIX) 40 MG tablet Take 1 tablet (40 mg total) by mouth daily. 09/14/12  Yes Leroy Sea, MD  insulin glargine (LANTUS) 100 UNIT/ML injection Inject 18 Units into the skin at bedtime.   Yes Historical Provider, MD  ipratropium-albuterol (DUONEB) 0.5-2.5 (3) MG/3ML SOLN Take 3 mLs by nebulization 4 (four) times daily as needed. 10/02/12 10/12/12 Yes Historical Provider, MD  magnesium oxide (MAG-OX) 400 MG tablet Take 1 tablet (400  mg total) by mouth 2 (two) times daily. 07/25/12  Yes Dolores Patty, MD  metFORMIN (GLUCOPHAGE) 500 MG tablet Take 500 mg by mouth 2 (two) times daily with a meal.   Yes Historical Provider, MD  ondansetron (ZOFRAN) 4 MG tablet Take 4 mg by mouth every 4 (four) hours as needed for nausea.   Yes Historical Provider, MD  pravastatin (PRAVACHOL) 40 MG tablet Take 40 mg by mouth at bedtime. 07/25/12  Yes Bevelyn Buckles Bensimhon, MD  rOPINIRole (REQUIP) 0.5 MG tablet Take 0.5 mg by mouth at bedtime.   Yes Historical Provider, MD  traMADol (ULTRAM) 50 MG tablet Take 50 mg by mouth at bedtime.   Yes Historical Provider, MD  traZODone (DESYREL) 50 MG tablet Take 50 mg by mouth at bedtime.   Yes Historical Provider, MD  warfarin (COUMADIN) 3 MG tablet Take 3 mg by mouth daily.   Yes Historical Provider, MD     History   Social History  . Marital Status: Widowed    Spouse Name: N/A    Number of Children: 2  . Years of Education: N/A   Occupational History  . Not on file.   Social History Main Topics  . Smoking  status: Never Smoker   . Smokeless tobacco: Never Used  . Alcohol Use: No  . Drug Use: No  . Sexually Active: Not on file   Other Topics Concern  . Not on file   Social History Narrative   Lives at home alone.     No family status information on file.   Family History  Problem Relation Age of Onset  . CAD Father 36    Died of MI  . CAD Brother 89    Died of MI  . CAD Brother 62    Died of MI  . CAD Mother 70    Died of MI     ROS:   General: Positive for fatigue. Denies fever, chills, diaphoresis, appetite change. HEENT: Denies change in vision, congestion, sore throat, rhinorrhea, trouble swallowing, neck pain or neck stiffness.   Respiratory: Positive for SOB on exertion and dry cough. Denies wheezing, or chest tightness.  Cardiovascular: Positive for recent palpitations. Denies chest pain and leg swelling.  Gastrointestinal: Denies nausea, vomiting, abdominal pain, diarrhea, constipation, blood in stool and abdominal distention.  Endocrine: Denies sweats, polyuria, polydipsia. Musculoskeletal: Denies myalgias, back pain, joint swelling, arthralgias or gait problem.  Skin: Denies pallor, rash and wounds.  Neurological: Positive for dizziness when standing. Denies seizures, syncope, weakness, lightheadedness, numbness and headaches.  Hematological: Denies adenopathy, easy bruising, personal or family bleeding history.  Psychiatric/Behavioral: Denies mood changes, confusion, nervousness, sleep disturbance and agitation.  Physical Exam: Blood pressure 104/45, pulse 66, temperature 98.3 F (36.8 C), temperature source Oral, resp. rate 19, height 5\' 4"  (1.626 m), weight 138 lb 14.4 oz (63.005 kg), SpO2 98.00%.  General: Vital signs reviewed.  Patient is a well-developed and well-nourished, in no acute distress and cooperative with exam. Alert and oriented x3.  Head: Normocephalic and atraumatic. Mouth: No erythema, exudates, sores, or ulcerations. Moist mucus  membranes. Eyes: PERRL, EOMI, conjunctivae normal, No scleral icterus.  Neck: Supple, trachea midline, normal ROM, No JVD, masses, thyromegaly, or carotid bruit present.  Cardiovascular: RRR, S1 normal, S2 normal, no murmurs, gallops, or rubs. Pulmonary/Chest: Normal respiratory effort, CTAB, no wheezes, rales, or rhonchi.  Abdominal: Soft. Non-tender, non-distended, bowel sounds are normal, no masses, organomegaly, or guarding present.  Musculoskeletal: No joint deformities, erythema, or  stiffness, ROM full and no nontender. Extremities: No swelling or edema,  pulses symmetric and intact bilaterally. No cyanosis or clubbing. Neurological: A&O x3, Strength is normal and symmetric bilaterally, cranial nerve II-XII are grossly intact, no focal motor deficit, sensory intact to light touch bilaterally.  Skin: Warm, dry and intact. No rashes or erythema. Psychiatric: Normal mood and affect. speech and behavior is normal. Cognition and memory are normal.  Labs:   Lab Results  Component Value Date   WBC 3.8* 10/14/2012   HGB 9.9* 10/14/2012   HCT 30.4* 10/14/2012   MCV 94.4 10/14/2012   PLT 181 10/14/2012    Recent Labs  10/14/12 0940  INR 1.33    Recent Labs Lab 10/14/12 0940  NA 133*  K 3.9  CL 95*  CO2 30  BUN 25*  CREATININE 1.15*  CALCIUM 9.1  PROT 5.5*  BILITOT 0.8  ALKPHOS 73  ALT 10  AST 17  GLUCOSE 92   Magnesium  Date Value Range Status  10/14/2012 1.9  1.5 - 2.5 mg/dL Final    Recent Labs  16/10/96 1537  TROPONINI <0.30   No results found for this basename: TROPIPOC,  in the last 72 hours Pro B Natriuretic peptide (BNP)  Date/Time Value Range Status  10/12/2012  3:37 PM 7410.0* 0 - 450 pg/mL Final  09/06/2012  3:02 AM 3823.0* 0 - 450 pg/mL Final   No results found for this basename: CHOL, HDL, LDLCALC, TRIG   Lab Results  Component Value Date   DDIMER 0.32 09/06/2012   No results found for this basename: Lipase, amylase   TSH  Date/Time Value Range  Status  09/01/2012 10:48 AM 3.598  0.350 - 4.500 uIU/mL Final   Vitamin B-12  Date/Time Value Range Status  09/07/2012  3:55 AM 279  211 - 911 pg/mL Final     Folate  Date/Time Value Range Status  09/07/2012  3:55 AM 16.4   Final     (NOTE)     Reference Ranges            Deficient:       0.4 - 3.3 ng/mL            Indeterminate:   3.4 - 5.4 ng/mL            Normal:              > 5.4 ng/mL     Ferritin  Date/Time Value Range Status  09/07/2012  3:55 AM 110  10 - 291 ng/mL Final     TIBC  Date/Time Value Range Status  09/07/2012  3:55 AM 293  250 - 470 ug/dL Final     Iron  Date/Time Value Range Status  09/07/2012  3:55 AM 37* 42 - 135 ug/dL Final     Retic Ct Pct  Date/Time Value Range Status  09/07/2012  3:55 AM 3.8* 0.4 - 3.1 % Final    Echo: 09/07/12 Study Conclusions: - Left ventricle: The cavity size was normal. Systolic function was normal. The estimated ejection fraction was in the range of 60% to 65%. Wall motion was normal; there were no regional wall motion abnormalities. - Aortic valve: Mild regurgitation. - Mitral valve: Reported prior MV repair. Thickening and calcification noted of both anterior and psoterior leaflets. Possible mitral annular calcification also. Prior procedures included surgical repair. An annular ring prosthesis was present. Transvalvular velocity was increased, due to stenosis. The findings are consistent with moderate stenosis. Mild to moderate regurgitation. Valve  area by continuity equation (using LVOT flow): 1.11cm^2. - Right atrium: The atrium was mildly dilated. - Tricuspid valve: Moderate regurgitation. - Pulmonary arteries: Systolic pressure was moderately increased. PA peak pressure: 62mm Hg (S).  ECG: NSR @ 79 bpm.  CHEST XR- 2 VIEW: 10/12/12 Comparison: 09/20/2012  Findings: Left chest wall pacer device is noted with lead in the  right atrial appendage and right ventricle. The heart size is  normal. There are  bilateral pleural effusions right greater than  left. Compared with previous exam the right pleural effusion has  increased in the interval. Atelectasis noted in the lung bases.  IMPRESSION:  1. Bilateral pleural effusions, right greater than left.  2. Interval increase in volume of right effusion.  CT chest without contrast: 10/13/2012  1. Bilateral pleural effusions, right greater than left, with  minimal loculation inferiorly on the right.  2. Bilateral patchy collapse/consolidation, right greater than  left, likely due to a combination of pneumonia and atelectasis.   ASSESSMENT AND PLAN:    79 y.o. y/o female w/ PMHx of diastolic CHF, CAD (s/p cath in 04/2012, showed non-obstructive disease), severe MR (s/p valve repair on 05/20/12), atrial fibrillation (s/p Maze operation on 05/20/12 and DCCV on 06/06/12), PPM placement (09/19/12) for symptomatic bradycardia, pulmonary HTN, recurrent pleural effusions (thoracentesis in 08/2012), anemia, and DM, presented to the ED on 10/12/12 w/ exertional SOB.  1) Acute on chronic diastolic HF:Her recent imaging studies show presence of bilateral pleural effusions which she has had in the past and were shown to be transudative in nature. Her most recent ECHO on 09/07/12 showed an EF of 60-65%, but given her history of diastolic CHF and previous valve disease, her pulmonary effusions and recent SOB are most likely d/t her diastolic dysfunction.  Thoracentesis removed 680 ml from R. Side. Fluid assessment pending. Continue Lasix 40 mg qd  2) Atrial fibrillation: The patient has had episodes of paradoxial A-fib in the past was was successfully cardioverted in March. However, recent ICD interrogation shows that she has had some recent episodes. Restart Coumadin at home.   Signed: Lars Masson, MD 10/15/2012 1:17 PM  Patient seen, examined. Available data reviewed. Agree with findings, assessment, and plan as outlined by Lars Masson, MD. Exam reveals a delightful  elderly woman in NAD. JVP is moderately elevated at 8-10 cm, lung fields are clear, and heart is regular. Chest exam reveals swelling at her pacemaker site without significant tenderness (improved per patient). There is no peripheral edema. She has just returned from a right thoracentesis yielding about 700 cc transudative fluid. The patient's history was reviewed, as were lab findings, echo and cath data. She is clinically improved with IV diuresis and therapeutic thoracentesis. I'm not convinced she has pneumonia and suspect her clinical presentation is primarily due to diastolic heart failure. Her mitral repair is intact without residual MR. I would continue current meds for now. She is on IV lasix 40 mg daily, weight is down 2kg, and she is clinically improved. I think she will need to increase her maintenance lasix dose when converted back to oral. Family asked about thoracentesis on the left but left effusion appears small and I suspect this is related to heart failure, so CHF medical therapy is probably best. Anticipate she will be ready for hospital discharge in next 24-48 hours.  Tonny Bollman, M.D. 10/15/2012 4:56 PM

## 2012-10-15 NOTE — Progress Notes (Signed)
[facility] PROGRESS NOTE  Tacy Chavis WJX:[id number] DOB: 03/21/1925 DOA: 10/12/2012 PCP: Marylen Ponto, MD  Assessment/Plan: Principal Problem:   Acute respiratory failure Active Problems:   Diabetes type 2, uncontrolled   Atrial fibrillation   Pleural effusion   Depression    Assessment/Plan:  1. Pleural effusion bilateral; parapneumonic versus secondary to diastolic heart failure, pending thoracentesis during this admission by interventional radiology.  Patient received vitamin K 10 mg secondary to supratherapeutic INR; will check pleural fluid LDH, total protein, pH, body fluid culture and cell count. She had thoracentesis during her last admission as well, multiple recurrent hospitalizations, will consult cardiology due to complicated cardiac history as well as recurrent heart failure   2. Atrial fib; on exam patient was in regular rhythm and rate we'll restart Coumadin after paracentesis completed    3. Acute respiratory failure; most likely secondary to bilateral pleural effusion see #1 4.  5. DM; currently controlled hemoglobin A1c on 09/08/2012= 5.4 Pacer placement; will contact cardiology in the a.m. and brief health care plan  5. CAP; continue vancomycin+ cefepime day 2/7   Code Status:Full , cardiology may want to address the patient's CODE STATUS Family Communication: Numerous family members were present for review of plan of care  Disposition Plan:  Procedures:  CT chest without contrast 10/13/2012  1. Bilateral pleural effusions, right greater than left, with  minimal loculation inferiorly on the right.  2. Bilateral patchy collapse/consolidation, right greater than  left, likely due to a combination of pneumonia and atelectasis.  Antibiotics:  vancomycin+ cefepime day 2/7  HPI/Subjective:  [name] [name] is a 79 y.o. WF PMHx S/P minimally invasive mitral valve repair and Maze procedure 05/20/2012 of a recent permanent pacemaker implantation (on 09/19/2012 by  Dr. Hillis Range) complicated by a pacemaker pocket hematoma, atrial fibrillation on chronic Coumadin therapy, mitral regurgitation status post mitral valve replacement in February of 2014, diabetes and who presents today with the above noted complaint. Patient has had prior pleural effusion in the past, per discharge summary on June, pleural fluid analysis was consistent with transudative pleural effusion likely secondary to chronic diastolic heart failure. Patient subsequently was readmitted in late June and underwent a permanent pacemaker implantation. She was then discharged to a skilled nursing facility, she was slowly making progress, however approximately 2 weeks back she then started experiencing a dry hacking cough. She was placed on a week of Levaquin, which she just finished this past Wednesday. She continued to have cough, per her and her family she has functional decline and has had issues with generalized weakness. Patient claims that doing her regular activities of living also make her short of breath. She denies any shortness of breath at rest. Denies any chest pain. Denies any peripheral edema. Because of these ongoing symptoms, repeat chest x-ray was done at the skilled nursing facility today, which was read as worsening of her pleural effusions with questionable pneumonia. She was then transferred to the emergency room for further evaluation and treatment, I was subsequently called to admit this patient.  Patient denies any chest pain, fever, nausea, vomiting, or diarrhea. She denies any abdominal pain.      Objective: Filed Vitals:   10/14/12 2033 10/14/12 2038 10/15/12 0520 10/15/12 0823  BP: 118/54  104/45   Pulse: 58  66   Temp: 98.3 F (36.8 C)  98.3 F (36.8 C)   TempSrc: Oral  Oral   Resp: 18  19   Height:      Weight:  63.005 kg (138 lb 14.4 oz)   SpO2: 96% 96% 96% 98%    Intake/Output Summary (Last 24 hours) at 10/15/12 1233 Last data filed at 10/15/12 1100  Gross  per 24 hour  Intake    843 ml  Output   1300 ml  Net   -457 ml    Exam:  HENT:  Head: Atraumatic.  Nose: Nose normal.  Mouth/Throat: Oropharynx is clear and moist.  Eyes: Conjunctivae are normal. Pupils are equal, round, and reactive to light. No scleral icterus.  Neck: Neck supple. No tracheal deviation present.  Cardiovascular: Normal rate, regular rhythm, normal heart sounds and intact distal pulses.  Pulmonary/Chest: Effort normal and breath sounds normal. No respiratory distress.  Abdominal: Soft. Normal appearance and bowel sounds are normal. She exhibits no distension. There is no tenderness.  Musculoskeletal: She exhibits no edema and no tenderness.  Neurological: She is alert. No cranial nerve deficit.    Data Reviewed: Basic Metabolic Panel:  Recent Labs Lab 10/12/12 1537 10/13/12 0605 10/14/12 0940  NA 134* 135 133*  K 3.7 3.6 3.9  CL 97 98 95*  CO2 24 30 30   GLUCOSE 91 42* 92  BUN 23 23 25*  CREATININE 1.23* 0.99 1.15*  CALCIUM 9.0 9.2 9.1  MG  --   --  1.9    Liver Function Tests:  Recent Labs Lab 10/12/12 1537 10/13/12 0605 10/14/12 0940  AST 18 18 17   ALT 11 11 10   ALKPHOS 75 75 73  BILITOT 0.7 0.7 0.8  PROT 5.8* 5.8* 5.5*  ALBUMIN 2.9* 2.8* 2.7*   No results found for this basename: LIPASE, AMYLASE,  in the last 168 hours No results found for this basename: AMMONIA,  in the last 168 hours  CBC:  Recent Labs Lab 10/12/12 1537 10/13/12 0605 10/14/12 0940  WBC 4.7 3.9* 3.8*  NEUTROABS 3.6  --  2.4  HGB 9.8* 9.9* 9.9*  HCT 29.4* 30.5* 30.4*  MCV 94.2 94.4 94.4  PLT 183 187 181    Cardiac Enzymes:  Recent Labs Lab 10/12/12 1537  TROPONINI <0.30   BNP (last 3 results)  Recent Labs  05/13/12 0535 09/06/12 0302 10/12/12 1537  PROBNP 1125.0* 3823.0* 7410.0*     CBG:  Recent Labs Lab 10/14/12 1546 10/14/12 2045 10/15/12 0644 10/15/12 0750 10/15/12 1115  GLUCAP 111* 229* 63* 115* 156*    Recent Results (from  the past 240 hour(s))  CULTURE, BLOOD (ROUTINE X 2)     Status: None   Collection Time    10/12/12  3:35 PM      Result Value Range Status   Specimen Description BLOOD RIGHT ARM   Final   Special Requests BOTTLES DRAWN AEROBIC ONLY 3.5CC   Final   Culture  Setup Time 10/12/2012 20:47   Final   Culture     Final   Value:        BLOOD CULTURE RECEIVED NO GROWTH TO DATE CULTURE WILL BE HELD FOR 5 DAYS BEFORE ISSUING A FINAL NEGATIVE REPORT   Report Status PENDING   Incomplete  CULTURE, BLOOD (ROUTINE X 2)     Status: None   Collection Time    10/12/12  3:45 PM      Result Value Range Status   Specimen Description BLOOD RIGHT ARM   Final   Special Requests BOTTLES DRAWN AEROBIC ONLY 4CC   Final   Culture  Setup Time 10/12/2012 20:47   Final   Culture  Final   Value:        BLOOD CULTURE RECEIVED NO GROWTH TO DATE CULTURE WILL BE HELD FOR 5 DAYS BEFORE ISSUING A FINAL NEGATIVE REPORT   Report Status PENDING   Incomplete  MRSA PCR SCREENING     Status: None   Collection Time    10/12/12  7:22 PM      Result Value Range Status   MRSA by PCR NEGATIVE  NEGATIVE Final   Comment:            The GeneXpert MRSA Assay (FDA     approved for NASAL specimens     only), is one component of a     comprehensive MRSA colonization     surveillance program. It is not     intended to diagnose MRSA     infection nor to guide or     monitor treatment for     MRSA infections.     Studies: Dg Chest 2 View  10/12/2012   *RADIOLOGY REPORT*  Clinical Data: Short of breath.  CHEST - 2 VIEW  Comparison: 09/20/2012  Findings: Left chest wall pacer device is noted with lead in the right atrial appendage and right ventricle.  The heart size is normal.  There are bilateral pleural effusions right greater than left.  Compared with previous exam the right pleural effusion has increased in the interval.  Atelectasis noted in the lung bases.  IMPRESSION:  1.  Bilateral pleural effusions, right greater than left.  2.  Interval increase in volume of right effusion.   Original Report Authenticated By: Signa Kell, M.D.   Dg Chest 2 View  09/20/2012   *RADIOLOGY REPORT*  Clinical Data: Pacemaker insertion  CHEST - 2 VIEW  Comparison: 09/13/2012  Findings: Dual lead left subclavian pacemaker device and leads have been placed.  Leads are positioned in the right atrium and right ventricle.  Right pleural effusion has increased.  Dense opacity at the right base is worse with worsening shift of the mediastinum to the right.  Left lung remains relatively clear with a small stable left pleural effusion.  No evidence of pneumothorax.  IMPRESSION: Left subclavian pacemaker placement without evidence of pneumothorax.  Increasing airspace disease and volume loss at the right lung base with shift of mediastinum to the right.  Bilateral pleural effusions as described.   Original Report Authenticated By: Jolaine Click, M.D.   Ct Chest Wo Contrast  10/13/2012   *RADIOLOGY REPORT*  Clinical Data:   Question pneumonia versus pleural effusion.  CT CHEST WITHOUT CONTRAST  Technique:  Multidetector CT imaging of the chest was performed following the standard protocol without IV contrast.  Comparison: Chest radiograph 10/12/2012.  Findings: Mediastinal lymph nodes are not enlarged by CT size criteria.  Hilar regions are difficult to definitively evaluate without IV contrast.  No axillary adenopathy.  Atherosclerotic calcification of the arterial vasculature, including coronary arteries.  Pulmonary arteries and heart are enlarged.  No pericardial effusion.  Pacemaker lead tips terminate in the right atrium and right ventricle.  Mitral valve replacement.  There are bilateral pleural effusions, moderate on the right and small on the left.  Right pleural effusion is minimally loculated anterolaterally, in the inferior right hemithorax.  Associated collapse/consolidation in the right upper, right middle and right lower lobes.   Collapse/consolidation is worst in the right lower lobe.  Mild patchy collapse/consolidation in the left lower lobe. Upper lobe predominant ground-glass may be due in part to expiratory phase imaging.  Airway is otherwise unremarkable.  Incidental imaging of the upper abdomen shows no acute findings. No worrisome lytic or sclerotic lesions.  Degenerative changes are seen in the spine and shoulders.  IMPRESSION:  1.  Bilateral pleural effusions, right greater than left, with minimal loculation inferiorly on the right. 2.  Bilateral patchy collapse/consolidation, right greater than left, likely due to a combination of pneumonia and atelectasis.   Original Report Authenticated By: Leanna Battles, M.D.    Scheduled Meds: . ipratropium  0.5 mg Nebulization TID   And  . albuterol  2.5 mg Nebulization TID  . amiodarone  200 mg Oral BID  . ceFEPime (MAXIPIME) IV  1 g Intravenous Q24H  . citalopram  20 mg Oral Daily  . docusate sodium  100 mg Oral BID  . feeding supplement  237 mL Oral Q24H  . furosemide  40 mg Intravenous Daily  . insulin aspart  0-9 Units Subcutaneous TID WC  . insulin glargine  15 Units Subcutaneous QHS  . magnesium oxide  400 mg Oral BID  . multivitamin with minerals  1 tablet Oral Daily  . nystatin   Topical BID  . rOPINIRole  0.5 mg Oral QHS  . simvastatin  20 mg Oral q1800  . sodium chloride  3 mL Intravenous Q12H  . sodium chloride  3 mL Intravenous Q12H  . traMADol  50 mg Oral QHS  . traZODone  50 mg Oral QHS  . vancomycin  750 mg Intravenous Q24H   Continuous Infusions:   Principal Problem:   Acute respiratory failure Active Problems:   Diabetes type 2, uncontrolled   Atrial fibrillation   Pleural effusion   Depression    Time spent: 40 minutes   [facility]  [facility] Pager [phone]. If 8PM-8AM, please contact night-coverage at [email or web address], password [facility] 10/15/2012, 12:33 PM  LOS: 3 days

## 2012-10-15 NOTE — Progress Notes (Signed)
Pt returned from thorocentesis 125/43 HR 73 rr 18 )2 sat on 2 L n/c 92 % 1 view CXR done prior to coming back to room.

## 2012-10-16 ENCOUNTER — Encounter: Payer: Medicare Other | Admitting: Cardiology

## 2012-10-16 DIAGNOSIS — J90 Pleural effusion, not elsewhere classified: Secondary | ICD-10-CM

## 2012-10-16 LAB — PH, BODY FLUID: pH, Fluid: 8.5

## 2012-10-16 LAB — BASIC METABOLIC PANEL
BUN: 26 mg/dL — ABNORMAL HIGH (ref 6–23)
Calcium: 9.1 mg/dL (ref 8.4–10.5)
Chloride: 95 mEq/L — ABNORMAL LOW (ref 96–112)
Creatinine, Ser: 0.98 mg/dL (ref 0.50–1.10)
GFR calc Af Amer: 59 mL/min — ABNORMAL LOW (ref >90)

## 2012-10-16 LAB — PROTIME-INR
INR: 1.02 (ref 0.00–1.49)
Prothrombin Time: 13.2 seconds (ref 11.6–15.2)

## 2012-10-16 LAB — GLUCOSE, CAPILLARY
Glucose-Capillary: 133 mg/dL — ABNORMAL HIGH (ref 70–99)
Glucose-Capillary: 206 mg/dL — ABNORMAL HIGH (ref 70–99)

## 2012-10-16 MED ORDER — WARFARIN SODIUM 3 MG PO TABS
3.0000 mg | ORAL_TABLET | Freq: Once | ORAL | Status: AC
  Administered 2012-10-16: 3 mg via ORAL
  Filled 2012-10-16: qty 1

## 2012-10-16 MED ORDER — WARFARIN - PHARMACIST DOSING INPATIENT: Freq: Every day | Status: DC

## 2012-10-16 MED ORDER — FUROSEMIDE 10 MG/ML IJ SOLN
40.0000 mg | Freq: Three times a day (TID) | INTRAMUSCULAR | Status: DC
  Administered 2012-10-16 – 2012-10-17 (×4): 40 mg via INTRAVENOUS
  Filled 2012-10-16 (×4): qty 4

## 2012-10-16 MED ORDER — VANCOMYCIN HCL IN DEXTROSE 1-5 GM/200ML-% IV SOLN
1000.0000 mg | INTRAVENOUS | Status: DC
  Filled 2012-10-16: qty 200

## 2012-10-16 NOTE — Progress Notes (Signed)
ANTICOAGULATION CONSULT NOTE - Initial Consult  Pharmacy Consult for coumadin Indication: atrial fibrillation  Allergies  Allergen Reactions  . Codeine Nausea And Vomiting    Patient Measurements: Height: 5\' 4"  (162.6 cm) Weight: 139 lb 9.6 oz (63.322 kg) (scale B) IBW/kg (Calculated) : 54.7   Vital Signs: Temp: 98.1 F (36.7 C) (07/24 0639) Temp src: Oral (07/24 0639) BP: 116/42 mmHg (07/24 0939) Pulse Rate: 61 (07/24 0939)  Labs:  Recent Labs  10/14/12 0940 10/16/12 1122  HGB 9.9*  --   HCT 30.4*  --   PLT 181  --   LABPROT 16.2*  --   INR 1.33  --   CREATININE 1.15* 0.98    Estimated Creatinine Clearance: 35.6 ml/min (by C-G formula based on Cr of 0.98).   Medical History: Past Medical History  Diagnosis Date  . Hypertension   . Hyperlipidemia   . Type 2 diabetes mellitus   . Arthritis   . Coronary artery disease     a. Mild nonobstructive by cath 04/2012, cath complicated by groin hematoma.  . S/P mitral valve repair 05/20/2012    a. Severe MR - 04/2012: 26mm Sorin Memo 3D ring annuloplasty via right mini thoracotomy approach  . S/P Maze operation for atrial fibrillation 05/20/2012    a. 04/2012: Complete bi-atrial lesion set using cryothermy  . Atrial fibrillation     a. Diagnosed 04/2012 in the setting of severe MR. b. s/p TEE/successful DCCV 06/06/12.  Marland Kitchen PEA (Pulseless electrical activity)     a. 04/2012 -> adm with afib, developed PEA, transferred to [facility] for prolonged hospitalization.   . Chronic diastolic CHF (congestive heart failure)   . Symptomatic bradycardia     a. s/p Medtronic pacemaker 08/2012 (pocket hematoma)  . Moderate to severe pulmonary hypertension   . Pleural effusion     a. s/p thoracentesis 08/2012.  Marland Kitchen Hypoxemia     a. During admission 08/2012 for for UTI, hypoxia, FTT and pleural effusion s/p thoracentesis with transudative findings.  . Failure to thrive     a. Admission 08/2012.    Medications:  Prescriptions prior to admission   Medication Sig Dispense Refill  . amiodarone (PACERONE) 200 MG tablet Take 1 tablet (200 mg total) by mouth 2 (two) times daily.  60 tablet  3  . citalopram (CELEXA) 20 MG tablet Take 20 mg by mouth daily.      . furosemide (LASIX) 40 MG tablet Take 1 tablet (40 mg total) by mouth daily.  30 tablet    . insulin glargine (LANTUS) 100 UNIT/ML injection Inject 18 Units into the skin at bedtime.      Marland Kitchen ipratropium-albuterol (DUONEB) 0.5-2.5 (3) MG/3ML SOLN Take 3 mLs by nebulization 4 (four) times daily as needed.      . magnesium oxide (MAG-OX) 400 MG tablet Take 1 tablet (400 mg total) by mouth 2 (two) times daily.  60 tablet  6  . metFORMIN (GLUCOPHAGE) 500 MG tablet Take 500 mg by mouth 2 (two) times daily with a meal.      . [EXPIRED] nystatin (MYCOSTATIN/NYSTOP) 100000 UNIT/GM POWD Apply topically 2 (two) times daily. Under breast      . ondansetron (ZOFRAN) 4 MG tablet Take 4 mg by mouth every 4 (four) hours as needed for nausea.      . pravastatin (PRAVACHOL) 40 MG tablet Take 40 mg by mouth at bedtime.      Marland Kitchen rOPINIRole (REQUIP) 0.5 MG tablet Take 0.5 mg by mouth at bedtime.      Marland Kitchen  traMADol (ULTRAM) 50 MG tablet Take 50 mg by mouth at bedtime.      . traZODone (DESYREL) 50 MG tablet Take 50 mg by mouth at bedtime.      Marland Kitchen warfarin (COUMADIN) 3 MG tablet Take 3 mg by mouth daily.        Assessment: 79 yo lady to resume coumadin for afib.  Her admission INR was 5.25 on home regimen of 3 mg daily.  She received Vitamin K 10 mg IV on 7/21 so that she could proceed with thorancentesis. Goal of Therapy:  INR 2-3 Monitor platelets by anticoagulation protocol: Yes   Plan:  Coumadin 3 mg today. Check daily PT/INR.  Talbert Cage Poteet 10/16/2012,1:12 PM

## 2012-10-16 NOTE — Progress Notes (Signed)
ANTIBIOTIC CONSULT NOTE   Pharmacy Consult for Vancomycin/Cefepime Indication: pneumonia  Allergies  Allergen Reactions  . Codeine Nausea And Vomiting    Patient Measurements: Height: 5\' 4"  (162.6 cm) Weight: 139 lb 9.6 oz (63.322 kg) (scale B) IBW/kg (Calculated) : 54.7  Vital Signs: Temp: 97.9 F (36.6 C) (07/24 1501) Temp src: Oral (07/24 1501) BP: 132/40 mmHg (07/24 1501) Pulse Rate: 61 (07/24 1501) Intake/Output from previous day: 07/23 0701 - 07/24 0700 In: 1097 [P.O.:897; IV Piggyback:200] Out: 1200 [Urine:1200] Intake/Output from this shift: Total I/O In: 860 [P.O.:860] Out: -   Labs:  Recent Labs  10/14/12 0940 10/16/12 1122  WBC 3.8*  --   HGB 9.9*  --   PLT 181  --   CREATININE 1.15* 0.98   Estimated Creatinine Clearance: 35.6 ml/min (by C-G formula based on Cr of 0.98).  Recent Labs  10/16/12 1724  VANCOTROUGH 12.7     Microbiology: Recent Results (from the past 720 hour(s))  CULTURE, BLOOD (ROUTINE X 2)     Status: None   Collection Time    10/12/12  3:35 PM      Result Value Range Status   Specimen Description BLOOD RIGHT ARM   Final   Special Requests BOTTLES DRAWN AEROBIC ONLY 3.5CC   Final   Culture  Setup Time 10/12/2012 20:47   Final   Culture     Final   Value:        BLOOD CULTURE RECEIVED NO GROWTH TO DATE CULTURE WILL BE HELD FOR 5 DAYS BEFORE ISSUING A FINAL NEGATIVE REPORT   Report Status PENDING   Incomplete  CULTURE, BLOOD (ROUTINE X 2)     Status: None   Collection Time    10/12/12  3:45 PM      Result Value Range Status   Specimen Description BLOOD RIGHT ARM   Final   Special Requests BOTTLES DRAWN AEROBIC ONLY 4CC   Final   Culture  Setup Time 10/12/2012 20:47   Final   Culture     Final   Value:        BLOOD CULTURE RECEIVED NO GROWTH TO DATE CULTURE WILL BE HELD FOR 5 DAYS BEFORE ISSUING A FINAL NEGATIVE REPORT   Report Status PENDING   Incomplete  MRSA PCR SCREENING     Status: None   Collection Time    10/12/12   7:22 PM      Result Value Range Status   MRSA by PCR NEGATIVE  NEGATIVE Final   Comment:            The GeneXpert MRSA Assay (FDA     approved for NASAL specimens     only), is one component of a     comprehensive MRSA colonization     surveillance program. It is not     intended to diagnose MRSA     infection nor to guide or     monitor treatment for     MRSA infections.  BODY FLUID CULTURE     Status: None   Collection Time    10/15/12  3:00 PM      Result Value Range Status   Specimen Description FLUID RIGHT PLEURAL   Final   Special Requests Normal   Final   Gram Stain     Final   Value: NO WBC SEEN     NO ORGANISMS SEEN   Culture NO GROWTH 1 DAY   Final   Report Status PENDING   Incomplete  Medical History: Past Medical History  Diagnosis Date  . Hypertension   . Hyperlipidemia   . Type 2 diabetes mellitus   . Arthritis   . Coronary artery disease     a. Mild nonobstructive by cath 04/2012, cath complicated by groin hematoma.  . S/P mitral valve repair 05/20/2012    a. Severe MR - 04/2012: 26mm Sorin Memo 3D ring annuloplasty via right mini thoracotomy approach  . S/P Maze operation for atrial fibrillation 05/20/2012    a. 04/2012: Complete bi-atrial lesion set using cryothermy  . Atrial fibrillation     a. Diagnosed 04/2012 in the setting of severe MR. b. s/p TEE/successful DCCV 06/06/12.  Marland Kitchen PEA (Pulseless electrical activity)     a. 04/2012 -> adm with afib, developed PEA, transferred to [facility] for prolonged hospitalization.   . Chronic diastolic CHF (congestive heart failure)   . Symptomatic bradycardia     a. s/p Medtronic pacemaker 08/2012 (pocket hematoma)  . Moderate to severe pulmonary hypertension   . Pleural effusion     a. s/p thoracentesis 08/2012.  Marland Kitchen Hypoxemia     a. During admission 08/2012 for for UTI, hypoxia, FTT and pleural effusion s/p thoracentesis with transudative findings.  . Failure to thrive     a. Admission 08/2012.    Medications:   Prescriptions prior to admission  Medication Sig Dispense Refill  . amiodarone (PACERONE) 200 MG tablet Take 1 tablet (200 mg total) by mouth 2 (two) times daily.  60 tablet  3  . citalopram (CELEXA) 20 MG tablet Take 20 mg by mouth daily.      . furosemide (LASIX) 40 MG tablet Take 1 tablet (40 mg total) by mouth daily.  30 tablet    . insulin glargine (LANTUS) 100 UNIT/ML injection Inject 18 Units into the skin at bedtime.      Marland Kitchen ipratropium-albuterol (DUONEB) 0.5-2.5 (3) MG/3ML SOLN Take 3 mLs by nebulization 4 (four) times daily as needed.      . magnesium oxide (MAG-OX) 400 MG tablet Take 1 tablet (400 mg total) by mouth 2 (two) times daily.  60 tablet  6  . metFORMIN (GLUCOPHAGE) 500 MG tablet Take 500 mg by mouth 2 (two) times daily with a meal.      . [EXPIRED] nystatin (MYCOSTATIN/NYSTOP) 100000 UNIT/GM POWD Apply topically 2 (two) times daily. Under breast      . ondansetron (ZOFRAN) 4 MG tablet Take 4 mg by mouth every 4 (four) hours as needed for nausea.      . pravastatin (PRAVACHOL) 40 MG tablet Take 40 mg by mouth at bedtime.      Marland Kitchen rOPINIRole (REQUIP) 0.5 MG tablet Take 0.5 mg by mouth at bedtime.      . traMADol (ULTRAM) 50 MG tablet Take 50 mg by mouth at bedtime.      . traZODone (DESYREL) 50 MG tablet Take 50 mg by mouth at bedtime.      Marland Kitchen warfarin (COUMADIN) 3 MG tablet Take 3 mg by mouth daily.       Scheduled:  Assessment: 79 y/o female patient admitted with shortness of breath, found to have bilateral pneumonia requiring broad spectrum antibiotics for HCAP. Receiving day #4 vancomycin/cefepime. Remains afebrile and renal fxn stable. Obtained vancomycin trough today , 12, below goal. Will change dose.  Goal of Therapy:  Vancomycin trough level 15-20 mcg/ml  Plan:  Change vancomycin to 1g IV q24 and continue cefepime 1g iv q24h.  Verlene Mayer, PharmD, BCPS Pager [phone] 10/16/2012,6:58  PM

## 2012-10-16 NOTE — Progress Notes (Signed)
Advanced Heart Failure Rounding Note   Subjective:    [name] is a 79 y.o. 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   She has been having SOB for a couple weeks now and says it is worse when she is changing and walking, only after a short distance. She says she had a CXR done in her nursing home ([facility]) that was thought to be a pneumonia and she was placed on antibiotics at that time, but she denies any recent fever, chills or cough productive of sputum. She denies any associated chest pain, lightheadedness, LOC, diaphoresis, orthopnea, or PND. She also had some episodes of palpitations last week, and her heart rate was found to be in the 100's, along w/ orthostatic hypotension, causing significant dizziness and weakness. At that time she went to Dr. Kandis Mannan clinic and had her ICD interrogated which showed that she was converting back and forth from a-fib to SR. She was placed back on amiodarone 200 mg bid at that time, according to her recent clinic note.   She has an extensive cardiac history and presented to [facility] in early February after PEA arrest and then found to have A-fib w/ RVR. She was re-hospitalized on 09/05/12-09/14/12 for UTI, hypoxia, failure to thrive, and pleural effusions s/p thoracentesis w/ transudative findings. She was again hospitalized on 09/19/12 after LOC suddenly, thought to be d/t bradyarrhythmia at that time, as she had a heart rate as low as 26 in the ED during that admission and had been off her rate controlling medications since her previous admission earlier in the month. She had  a PPM (dual-chamber Medtronic) placed by Dr. Johney Frame at that time, and the patient was discharged on 09/22/12.  Admit from home with increased dyspnea. S/P thoracentesis 10/16/11 (transudative). Diuresing with 40 mg IV lasix.     Objective:   Weight Range:  Vital Signs:   Temp:  [97.8 F (36.6 C)-98.1 F (36.7 C)] 98.1 F (36.7 C) (07/24 0639) Pulse Rate:  [61-76] 67 (07/24 0639) Resp:  [18-20] 18 (07/24 0639) BP: (112-131)/(44-56) 112/46 mmHg (07/24 0639) SpO2:  [92 %-100 %] 97 % (07/24 0639) Weight:  [63.322 kg (139 lb 9.6 oz)] 63.322 kg (139 lb 9.6 oz) (07/24 0639) Last BM Date: 10/12/12  Weight change: Filed Weights   10/14/12 0449 10/15/12 0520 10/16/12 0639  Weight: 64 kg (141 lb 1.5 oz) 63.005 kg (138 lb 14.4 oz) 63.322 kg (139 lb 9.6 oz)    Intake/Output:   Intake/Output Summary (Last 24 hours) at 10/16/12 0858 Last data filed at 10/16/12 1610  Gross per 24 hour  Intake   1217 ml  Output   1200 ml  Net     17 ml     Physical Exam: General:  Well appearing. No resp difficulty HEENT: normal Neck: supple. JVP 12 cm. Carotids 2+ bilat; no bruits. No lymphadenopathy or thryomegaly appreciated. Cor: PMI nondisplaced. Regular rate & rhythm. No rubs, gallops or murmurs. Lungs: Crackles at bases bilaterally.  Abdomen: soft, nontender, nondistended. No hepatosplenomegaly. No bruits or masses. Good bowel sounds. Extremities: no cyanosis, clubbing, rash, edema Neuro: alert & orientedx3, cranial nerves grossly intact.  moves all 4 extremities w/o difficulty. Affect pleasant Hematoma at [facility] site, patient states this is improved.   Telemetry: a-paced, v-sensed  Labs: Basic Metabolic Panel:  Recent Labs Lab 10/12/12 1537 10/13/12 0605 10/14/12 0940  NA 134* 135 133*  K 3.7 3.6 3.9  CL 97 98 95*  CO2 24 30 30   GLUCOSE 91 42* 92  BUN 23 23 25*  CREATININE 1.23* 0.99 1.15*  CALCIUM 9.0 9.2 9.1  MG  --   --  1.9    Liver Function Tests:  Recent Labs Lab  10/12/12 1537 10/13/12 0605 10/14/12 0940  AST 18 18 17   ALT 11 11 10   ALKPHOS 75 75 73  BILITOT 0.7 0.7 0.8  PROT 5.8* 5.8* 5.5*  ALBUMIN 2.9* 2.8* 2.7*   No results found for this basename: LIPASE, AMYLASE,  in the last 168 hours No results found for this basename: AMMONIA,  in the last 168 hours  CBC:  Recent Labs Lab 10/12/12 1537 10/13/12 0605 10/14/12 0940  WBC 4.7 3.9* 3.8*  NEUTROABS 3.6  --  2.4  HGB 9.8* 9.9* 9.9*  HCT 29.4* 30.5* 30.4*  MCV 94.2 94.4 94.4  PLT 183 187 181    Cardiac Enzymes:  Recent Labs Lab 10/12/12 1537  TROPONINI <0.30    BNP: BNP (last 3 results)  Recent Labs  05/13/12 0535 09/06/12 0302 10/12/12 1537  PROBNP 1125.0* 3823.0* 7410.0*     Other results:  EKG:   Imaging: Dg Chest 1 View  10/15/2012   *RADIOLOGY REPORT*  Clinical Data: Status post right thoracentesis.  CHEST - 1 VIEW  Comparison: CT chest 10/13/2012 and PA and lateral chest 10/12/2012.  Findings: Right pleural effusions seen on the prior examination is markedly decreased after thoracentesis.  No pneumothorax is identified.  Small left pleural effusion is noted.  Bibasilar atelectasis is seen.  Severe degenerative disease about the shoulders is noted.  IMPRESSION:  1.  Negative for pneumothorax after right thoracentesis. 2.  Small bilateral pleural effusions and basilar atelectasis.   Original Report Authenticated By: Holley Dexter, M.D.   US Thoracentesis Asp Pleural Space W/img Guide  10/15/2012   *RADIOLOGY REPORT*  Clinical Data:  Patient with prior history of mitral valve repair, MAZE procedure for atrial fibrillation, CHF, pulmonary hypertension, right pleural effusion.  Request is made for diagnostic and therapeutic right thoracentesis.  ULTRASOUND GUIDED DIAGNOSTIC AND THERAPEUTIC RIGHT  THORACENTESIS  An ultrasound guided thoracentesis was thoroughly discussed with the patient and questions answered.  The benefits, risks, alternatives and complications  were also discussed.  The patient understands and wishes to proceed with the procedure.  Written consent was obtained.  Ultrasound was performed to localize and mark an adequate pocket of fluid in the right chest.  The area was then prepped and draped in the normal sterile fashion.  1% Lidocaine was used for local anesthesia.  Under ultrasound guidance a 19 gauge Yueh catheter was introduced.  Thoracentesis was performed.  The catheter was removed and a dressing applied.  Complications:  none  Findings: A total of approximately 680 cc's of yellow fluid was removed. A fluid sample was sent for laboratory analysis.  IMPRESSION: Successful ultrasound guided diagnostic and therapeutic right thoracentesis yielding 680 cc's of pleural fluid.  Read by: Jeananne Rama, P.A.-C   Original Report Authenticated By: Tacey Ruiz, MD     Medications:     Scheduled Medications: . amiodarone  200 mg Oral BID  . ceFEPime (MAXIPIME) IV  1  g Intravenous Q24H  . citalopram  20 mg Oral Daily  . docusate sodium  100 mg Oral BID  . feeding supplement  237 mL Oral Q24H  . furosemide  40 mg Intravenous Daily  . insulin aspart  0-9 Units Subcutaneous TID WC  . insulin glargine  15 Units Subcutaneous QHS  . magnesium oxide  400 mg Oral BID  . multivitamin with minerals  1 tablet Oral Daily  . nystatin   Topical BID  . rOPINIRole  0.5 mg Oral QHS  . simvastatin  20 mg Oral q1800  . sodium chloride  3 mL Intravenous Q12H  . sodium chloride  3 mL Intravenous Q12H  . traMADol  50 mg Oral QHS  . traZODone  50 mg Oral QHS  . vancomycin  750 mg Intravenous Q24H    Infusions:    PRN Medications: sodium chloride, acetaminophen, acetaminophen, albuterol, alum & mag hydroxide-simeth, guaifenesin, ondansetron (ZOFRAN) IV, ondansetron, sodium chloride   Assessment/Plan  ASSESSMENT AND PLAN:  79 y.o. y/o female w/ PMHx of diastolic CHF, CAD (s/p cath in 04/2012, showed non-obstructive disease), severe MR (s/p valve repair  on 05/20/12), atrial fibrillation (s/p Maze operation on 05/20/12 and DCCV on 06/06/12), PPM placement (09/19/12) for symptomatic bradycardia, pulmonary HTN, recurrent pleural effusions (thoracentesis in 08/2012), anemia, and DM, presented to the ED on 10/12/12 w/ exertional SOB.   1) Acute on chronic diastolic HF:  Her recent imaging studies show presence of bilateral pleural effusions which she has had in the past and were shown to be transudative in nature. Her most recent ECHO on 09/07/12 showed an EF of 60-65%, but given her history of diastolic CHF and previous valve disease, her pulmonary effusions and recent SOB are most likely due to her diastolic dysfunction.  She is volume overloaded on exam with elevated JVP.  - Increase Lasix to 40 mg IV every 8 hrs.   2) Pleural Effusion: S/P thoracentesis -680 cc.  Transudative, suspect secondary to diastolic CHF.   3) Atrial fibrillation: S/P DC/CV in March 2014. Recent ICD interrogation shows that she has had some recent episodes. She is a-paced today.  Continue amiodarone, coumadin to be restarted post-thoracentesis.  4) s/p MV repair: Probably mild stenosis s/p repair.    Length of Stay: 4   Marca Ancona 10/16/2012, 8:58 AM  Advanced Heart Failure Team Pager [phone] (M-F; 7a - 4p)  Please contact [facility] for night-coverage after hours (4p -7a ) and weekends on [email or web address]

## 2012-10-16 NOTE — Progress Notes (Signed)
[facility] PROGRESS NOTE  Henri [name] [name]:[id number] DOB: Jun 14, 1924 DOA: 10/12/2012 PCP: Marylen Ponto, MD  Assessment/Plan: Principal Problem:   Acute respiratory failure Active Problems:   Diabetes type 2, uncontrolled   Atrial fibrillation   Pleural effusion   Depression     ) Acute on chronic diastolic HF: Her recent imaging studies show presence of bilateral pleural effusions which she has had in the past and were shown to be transudative in nature. Her most recent ECHO on 09/07/12 showed an EF of 60-65%, but given her history of diastolic CHF and previous valve disease, her pulmonary effusions and recent SOB are most likely due to her diastolic dysfunction. She is volume overloaded on exam with elevated JVP.  - Increase Lasix to 40 mg IV every 8 hrs.  2) Pleural Effusion: S/P thoracentesis -680 cc. Transudative, suspect secondary to diastolic CHF.  3) Atrial fibrillation: S/P DC/CV in March 2014. Recent ICD interrogation shows that she has had some recent episodes. She is a-paced today. Continue amiodarone, coumadin to be restarted post-thoracentesis.  4) s/p MV repair: Probably mild stenosis s/p repair. 5.DM; currently controlled hemoglobin A1c on 09/08/2012= 5.4 Pacer placement; will contact cardiology in the a.m. and brief health care plan  5. CAP; continue vancomycin+ cefepime day 4/7    Code Status: full Family Communication: family updated about patient's clinical progress Disposition Plan:  As above    Brief narrative: [name] is a 79 y.o. y/o female w/ PMHx of diastolic CHF, CAD (s/p cath in 04/2012, showed non-obstructive disease), severe MR (s/p valve repair on 05/20/12), atrial fibrillation (s/p Maze operation on 05/20/12 and DCCV on 06/06/12), PPM placement (09/19/12) for symptomatic bradycardia, pulmonary HTN, recurrent pleural effusions (thracentesis in 08/2012), anemia, and DM, presented to the ED on 10/12/12 w/ exertional SOB and ongoing dry cough mostly  at night.  She has been having SOB for a couple weeks now and says it is worse when she is changing and walking, only after a short distance. She says she had a CXR done in her nursing home ([facility]) that was thought to be a pneumonia and she was placed on antibiotics at that time, but she denies any recent fever, chills or cough productive of sputum. She denies any associated chest pain, lightheadedness, LOC, diaphoresis, orthopnea, or PND. She also had some episodes of palpitations last week, and her heart rate was found to be in the 100's, along w/ orthostatic hypotension, causing significant dizziness and weakness. At that time she went to Dr. Kandis Mannan clinic and had her ICD interrogated which showed that she was converting back and forth from a-fib to SR. She was placed back on amiodarone 200 mg bid at that time, according to her recent clinic note.  She has an extensive cardiac history and presented to [facility] in early February after PEA arrest and then found to have A-fib w/ RVR. She was re-hospitalized on 09/05/12-09/14/12 for UTI, hypoxia, failure to thrive, and pleural effusions s/p thoracentesis w/ transudative findings. She was again hospitalized on 09/19/12 after LOC suddenly, thought to be d/t bradyarrhythmia at that time, as she had a heart rate as low as 26 in the ED during that admission and had been off her rate controlling medications since her previous admission earlier in the month. She had a PPM (dual-chamber Medtronic) placed by Dr. Johney Frame at that time, and the patient was discharged on 09/22/12.  Admit from home with increased dyspnea. S/P thoracentesis 10/16/11 (transudative). Diuresing with 40 mg IV lasix.  Consultants: Laurey Morale, MD   Procedures:  None   Antibiotics:     HPI/Subjective: No complaints  Objective: Filed Vitals:   10/15/12 1700 10/15/12 2211 10/16/12 0639 10/16/12 0939  BP: 119/56 128/44 112/46 116/42  Pulse: 76 61 67 61  Temp: 98 F (36.7 C) 97.8  F (36.6 C) 98.1 F (36.7 C)   TempSrc: Oral Oral Oral   Resp: 18 20 18    Height:      Weight:   63.322 kg (139 lb 9.6 oz)   SpO2: 94% 100% 97%     Intake/Output Summary (Last 24 hours) at 10/16/12 1302 Last data filed at 10/16/12 1100  Gross per 24 hour  Intake    837 ml  Output    550 ml  Net    287 ml    Exam: General: Well appearing. No resp difficulty  HEENT: normal  Neck: supple. JVP 12 cm. Carotids 2+ bilat; no bruits. No lymphadenopathy or thryomegaly appreciated.  Cor: PMI nondisplaced. Regular rate & rhythm. No rubs, gallops or murmurs.  Lungs: Crackles at bases bilaterally.  Abdomen: soft, nontender, nondistended. No hepatosplenomegaly. No bruits or masses. Good bowel sounds.  Extremities: no cyanosis, clubbing, rash, edema  Neuro: alert & orientedx3, cranial nerves grossly intact. moves all 4 extremities w/o difficulty. Affect pleasant  Hematoma at [facility] site, patient states this is improved.       Data Reviewed: Basic Metabolic Panel:  Recent Labs Lab 10/12/12 1537 10/13/12 0605 10/14/12 0940 10/16/12 1122  NA 134* 135 133* 134*  K 3.7 3.6 3.9 3.7  CL 97 98 95* 95*  CO2 24 30 30  33*  GLUCOSE 91 42* 92 154*  BUN 23 23 25* 26*  CREATININE 1.23* 0.99 1.15* 0.98  CALCIUM 9.0 9.2 9.1 9.1  MG  --   --  1.9  --     Liver Function Tests:  Recent Labs Lab 10/12/12 1537 10/13/12 0605 10/14/12 0940  AST 18 18 17   ALT 11 11 10   ALKPHOS 75 75 73  BILITOT 0.7 0.7 0.8  PROT 5.8* 5.8* 5.5*  ALBUMIN 2.9* 2.8* 2.7*   No results found for this basename: LIPASE, AMYLASE,  in the last 168 hours No results found for this basename: AMMONIA,  in the last 168 hours  CBC:  Recent Labs Lab 10/12/12 1537 10/13/12 0605 10/14/12 0940  WBC 4.7 3.9* 3.8*  NEUTROABS 3.6  --  2.4  HGB 9.8* 9.9* 9.9*  HCT 29.4* 30.5* 30.4*  MCV 94.2 94.4 94.4  PLT 183 187 181    Cardiac Enzymes:  Recent Labs Lab 10/12/12 1537  TROPONINI <0.30   BNP (last 3  results)  Recent Labs  05/13/12 0535 09/06/12 0302 10/12/12 1537  PROBNP 1125.0* 3823.0* 7410.0*     CBG:  Recent Labs Lab 10/15/12 1115 10/15/12 1553 10/15/12 2203 10/16/12 0653 10/16/12 1106  GLUCAP 156* 121* 175* 119* 163*    Recent Results (from the past 240 hour(s))  CULTURE, BLOOD (ROUTINE X 2)     Status: None   Collection Time    10/12/12  3:35 PM      Result Value Range Status   Specimen Description BLOOD RIGHT ARM   Final   Special Requests BOTTLES DRAWN AEROBIC ONLY 3.5CC   Final   Culture  Setup Time 10/12/2012 20:47   Final   Culture     Final   Value:        BLOOD CULTURE RECEIVED NO GROWTH TO DATE  CULTURE WILL BE HELD FOR 5 DAYS BEFORE ISSUING A FINAL NEGATIVE REPORT   Report Status PENDING   Incomplete  CULTURE, BLOOD (ROUTINE X 2)     Status: None   Collection Time    10/12/12  3:45 PM      Result Value Range Status   Specimen Description BLOOD RIGHT ARM   Final   Special Requests BOTTLES DRAWN AEROBIC ONLY 4CC   Final   Culture  Setup Time 10/12/2012 20:47   Final   Culture     Final   Value:        BLOOD CULTURE RECEIVED NO GROWTH TO DATE CULTURE WILL BE HELD FOR 5 DAYS BEFORE ISSUING A FINAL NEGATIVE REPORT   Report Status PENDING   Incomplete  MRSA PCR SCREENING     Status: None   Collection Time    10/12/12  7:22 PM      Result Value Range Status   MRSA by PCR NEGATIVE  NEGATIVE Final   Comment:            The GeneXpert MRSA Assay (FDA     approved for NASAL specimens     only), is one component of a     comprehensive MRSA colonization     surveillance program. It is not     intended to diagnose MRSA     infection nor to guide or     monitor treatment for     MRSA infections.  BODY FLUID CULTURE     Status: None   Collection Time    10/15/12  3:00 PM      Result Value Range Status   Specimen Description FLUID RIGHT PLEURAL   Final   Special Requests Normal   Final   Gram Stain     Final   Value: NO WBC SEEN     NO ORGANISMS SEEN    Culture NO GROWTH 1 DAY   Final   Report Status PENDING   Incomplete     Studies: Dg Chest 1 View  10/15/2012   *RADIOLOGY REPORT*  Clinical Data: Status post right thoracentesis.  CHEST - 1 VIEW  Comparison: CT chest 10/13/2012 and PA and lateral chest 10/12/2012.  Findings: Right pleural effusions seen on the prior examination is markedly decreased after thoracentesis.  No pneumothorax is identified.  Small left pleural effusion is noted.  Bibasilar atelectasis is seen.  Severe degenerative disease about the shoulders is noted.  IMPRESSION:  1.  Negative for pneumothorax after right thoracentesis. 2.  Small bilateral pleural effusions and basilar atelectasis.   Original Report Authenticated By: Holley Dexter, M.D.   Dg Chest 2 View  10/12/2012   *RADIOLOGY REPORT*  Clinical Data: Short of breath.  CHEST - 2 VIEW  Comparison: 09/20/2012  Findings: Left chest wall pacer device is noted with lead in the right atrial appendage and right ventricle.  The heart size is normal.  There are bilateral pleural effusions right greater than left.  Compared with previous exam the right pleural effusion has increased in the interval.  Atelectasis noted in the lung bases.  IMPRESSION:  1.  Bilateral pleural effusions, right greater than left. 2.  Interval increase in volume of right effusion.   Original Report Authenticated By: Signa Kell, M.D.   Dg Chest 2 View  09/20/2012   *RADIOLOGY REPORT*  Clinical Data: Pacemaker insertion  CHEST - 2 VIEW  Comparison: 09/13/2012  Findings: Dual lead left subclavian pacemaker device and leads have been  placed.  Leads are positioned in the right atrium and right ventricle.  Right pleural effusion has increased.  Dense opacity at the right base is worse with worsening shift of the mediastinum to the right.  Left lung remains relatively clear with a small stable left pleural effusion.  No evidence of pneumothorax.  IMPRESSION: Left subclavian pacemaker placement without  evidence of pneumothorax.  Increasing airspace disease and volume loss at the right lung base with shift of mediastinum to the right.  Bilateral pleural effusions as described.   Original Report Authenticated By: Jolaine Click, M.D.   Ct Chest Wo Contrast  10/13/2012   *RADIOLOGY REPORT*  Clinical Data:   Question pneumonia versus pleural effusion.  CT CHEST WITHOUT CONTRAST  Technique:  Multidetector CT imaging of the chest was performed following the standard protocol without IV contrast.  Comparison: Chest radiograph 10/12/2012.  Findings: Mediastinal lymph nodes are not enlarged by CT size criteria.  Hilar regions are difficult to definitively evaluate without IV contrast.  No axillary adenopathy.  Atherosclerotic calcification of the arterial vasculature, including coronary arteries.  Pulmonary arteries and heart are enlarged.  No pericardial effusion.  Pacemaker lead tips terminate in the right atrium and right ventricle.  Mitral valve replacement.  There are bilateral pleural effusions, moderate on the right and small on the left.  Right pleural effusion is minimally loculated anterolaterally, in the inferior right hemithorax.  Associated collapse/consolidation in the right upper, right middle and right lower lobes.  Collapse/consolidation is worst in the right lower lobe.  Mild patchy collapse/consolidation in the left lower lobe. Upper lobe predominant ground-glass may be due in part to expiratory phase imaging.  Airway is otherwise unremarkable.  Incidental imaging of the upper abdomen shows no acute findings. No worrisome lytic or sclerotic lesions.  Degenerative changes are seen in the spine and shoulders.  IMPRESSION:  1.  Bilateral pleural effusions, right greater than left, with minimal loculation inferiorly on the right. 2.  Bilateral patchy collapse/consolidation, right greater than left, likely due to a combination of pneumonia and atelectasis.   Original Report Authenticated By: Leanna Battles,  M.D.   US Thoracentesis Asp Pleural Space W/img Guide  10/15/2012   *RADIOLOGY REPORT*  Clinical Data:  Patient with prior history of mitral valve repair, MAZE procedure for atrial fibrillation, CHF, pulmonary hypertension, right pleural effusion.  Request is made for diagnostic and therapeutic right thoracentesis.  ULTRASOUND GUIDED DIAGNOSTIC AND THERAPEUTIC RIGHT  THORACENTESIS  An ultrasound guided thoracentesis was thoroughly discussed with the patient and questions answered.  The benefits, risks, alternatives and complications were also discussed.  The patient understands and wishes to proceed with the procedure.  Written consent was obtained.  Ultrasound was performed to localize and mark an adequate pocket of fluid in the right chest.  The area was then prepped and draped in the normal sterile fashion.  1% Lidocaine was used for local anesthesia.  Under ultrasound guidance a 19 gauge Yueh catheter was introduced.  Thoracentesis was performed.  The catheter was removed and a dressing applied.  Complications:  none  Findings: A total of approximately 680 cc's of yellow fluid was removed. A fluid sample was sent for laboratory analysis.  IMPRESSION: Successful ultrasound guided diagnostic and therapeutic right thoracentesis yielding 680 cc's of pleural fluid.  Read by: Jeananne Rama, P.A.-C   Original Report Authenticated By: Tacey Ruiz, MD    Scheduled Meds: . amiodarone  200 mg Oral BID  . ceFEPime (MAXIPIME) IV  1  g Intravenous Q24H  . citalopram  20 mg Oral Daily  . docusate sodium  100 mg Oral BID  . feeding supplement  237 mL Oral Q24H  . furosemide  40 mg Intravenous Q8H  . insulin aspart  0-9 Units Subcutaneous TID WC  . insulin glargine  15 Units Subcutaneous QHS  . magnesium oxide  400 mg Oral BID  . multivitamin with minerals  1 tablet Oral Daily  . nystatin   Topical BID  . rOPINIRole  0.5 mg Oral QHS  . simvastatin  20 mg Oral q1800  . sodium chloride  3 mL Intravenous Q12H   . sodium chloride  3 mL Intravenous Q12H  . traMADol  50 mg Oral QHS  . traZODone  50 mg Oral QHS  . vancomycin  750 mg Intravenous Q24H   Continuous Infusions:   Principal Problem:   Acute respiratory failure Active Problems:   Diabetes type 2, uncontrolled   Atrial fibrillation   Pleural effusion   Depression    Time spent: 40 minutes   [facility]  [facility] Pager [phone]. If 8PM-8AM, please contact night-coverage at [email or web address], password [facility] 10/16/2012, 1:02 PM  LOS: 4 days

## 2012-10-16 NOTE — Care Management Note (Signed)
    Page 1 of 1   10/16/2012     11:07:23 AM   CARE MANAGEMENT NOTE 10/16/2012  Patient:  [name] [name], [name] [name]   Account Number:  [id number]  Date Initiated:  10/16/2012  Documentation initiated by:  Oletta Cohn  Subjective/Objective Assessment:   79 y.o. female with a Past Medical History of a recent permanent pacemaker implantation complicated by a pacemaker pocket hematoma, atrial fibrillation on Coumadin therapy, mitral regurgitation status post mitral valve replacement// SNF     Action/Plan:   diureses//return to SNF   Anticipated DC Date:     Anticipated DC Plan:    In-house referral  Clinical Social Worker      DC Associate Professor  CM consult      [facility] Choice  Resumption Of Svcs/PTA [place]   Choice offered to / List presented to:             Status of service:   Medicare Important Message given?   (If response is "NO", the following Medicare IM given date fields will be blank) Date Medicare IM given:   Date Additional Medicare IM given:    Discharge Disposition:    Per UR Regulation:  Reviewed for med. necessity/level of care/duration of stay  If discussed at Long Length of Stay Meetings, dates discussed:    Comments:  10/16/12 @1100 .Marland KitchenMarland KitchenOletta Cohn, RN, BSN, [place] [phone] MD-FL2 in shadow chart for your review and signature. Plan return to [facility] when medically stable.

## 2012-10-16 NOTE — Progress Notes (Signed)
Pt says she's ready to go back to [facility] tomorrow & get on with getting stronger so she can soon go back to her home. MD added Lasix IV on today . Pt having great output . Has worn adult depends due to fact that she says she has no urge or feeling she has to go . Placed on BSC every 2 hr anyways. Depends still soaked.  pericare done with each brief change

## 2012-10-17 LAB — BASIC METABOLIC PANEL
Calcium: 9.4 mg/dL (ref 8.4–10.5)
Chloride: 97 mEq/L (ref 96–112)
Creatinine, Ser: 1.1 mg/dL (ref 0.50–1.10)
GFR calc Af Amer: 51 mL/min — ABNORMAL LOW (ref >90)

## 2012-10-17 LAB — GLUCOSE, CAPILLARY
Glucose-Capillary: 91 mg/dL (ref 70–99)
Glucose-Capillary: 165 mg/dL — ABNORMAL HIGH (ref 70–99)

## 2012-10-17 LAB — PROTIME-INR: Prothrombin Time: 14.3 seconds (ref 11.6–15.2)

## 2012-10-17 MED ORDER — FUROSEMIDE 40 MG PO TABS: 40.0000 mg | ORAL_TABLET | Freq: Two times a day (BID) | ORAL | Status: DC

## 2012-10-17 MED ORDER — CEFDINIR 300 MG PO CAPS: 600.0000 mg | ORAL_CAPSULE | Freq: Two times a day (BID) | ORAL | Status: DC

## 2012-10-17 MED ORDER — ALBUTEROL SULFATE (5 MG/ML) 0.5% IN NEBU: 2.5000 mg | INHALATION_SOLUTION | RESPIRATORY_TRACT | Status: DC | PRN

## 2012-10-17 MED ORDER — WARFARIN SODIUM 3 MG PO TABS
3.0000 mg | ORAL_TABLET | Freq: Once | ORAL | Status: DC
  Filled 2012-10-17: qty 1

## 2012-10-17 MED ORDER — SIMVASTATIN 20 MG PO TABS: 20.0000 mg | ORAL_TABLET | Freq: Every evening | ORAL | Status: AC

## 2012-10-17 MED ORDER — CEFDINIR 300 MG PO CAPS: 300.0000 mg | ORAL_CAPSULE | Freq: Two times a day (BID) | ORAL | Status: AC

## 2012-10-17 NOTE — Progress Notes (Signed)
Physical Therapy Treatment Patient Details Name: [name] [name] MRN: [id number] DOB: [birth year] Today's Date: 10/17/2012 Time: 6295-2841 PT Time Calculation (min): 15 min  PT Assessment / Plan / Recommendation  History of Present Illness Pt adm with acute resp failure due to pleural effusion.  Pt with recent pacer placement with pocket hematoma.  Pt has been at [facility] for SNF.   Clinical Impression Pt hoping to d/c today to [facility]. Rn unsure if pt will be leaving today or tomorrow. Pt able to increase amb distance and demo increased activity tolerance. Pt very appreciative of being walked with and in high spirits. Pt continues to demo some balance deficits and will benefit from STSNF before returning home, where she lives alone.    PT Comments   Pt continues to benefit from skilled PT.   Follow Up Recommendations  SNF     Does the patient have the potential to tolerate intense rehabilitation     Barriers to Discharge        Equipment Recommendations  None recommended by PT    Recommendations for Other Services    Frequency Min 3X/week   Progress towards PT Goals Progress towards PT goals: Progressing toward goals  Plan Current plan remains appropriate    Precautions / Restrictions Precautions Precautions: Fall Restrictions Weight Bearing Restrictions: No   Pertinent Vitals/Pain 100%O2 on RA after amb     Mobility  Bed Mobility Bed Mobility: Not assessed (pt sitting in chair and returned to chair for lunch) Supine to Sit: Not tested (comment) Transfers Transfers: Sit to Stand;Stand to Sit Sit to Stand: 3: Mod assist;From chair/3-in-1;With armrests;With upper extremity assist Stand to Sit: 4: Min guard;To chair/3-in-1;With armrests Details for Transfer Assistance: required 2 attempts for sit to stand; pt prefers (A) from R side due to port insertion pain on L; pt required increased (A) for sit to stand and to maintain balance; pt with posterior lean with transition; vc's for  hand placement and safety with RW Ambulation/Gait Ambulation/Gait Assistance: 4: Min guard Ambulation Distance (Feet): 220 Feet Assistive device: Rolling walker Ambulation/Gait Assistance Details: pt able to amb complete distance without rest break today; supervision for safety and min cues for safety and management of RW Gait Pattern: Step-through pattern;Decreased stride length Gait velocity: decreased General Gait Details: pt stated " i feel like im walking much better today." at end of session pt demo SOB; O2 on RA at 100% Stairs: No Wheelchair Mobility Wheelchair Mobility: No    Exercises General Exercises - Lower Extremity Ankle Circles/Pumps: AROM;10 reps   PT Diagnosis:    PT Problem List:   PT Treatment Interventions:     PT Goals (current goals can now be found in the care plan section) Acute Rehab PT Goals Patient Stated Goal: I really want to go to [facility] so i can get home after PT Goal Formulation: With patient Time For Goal Achievement: 10/20/12 Potential to Achieve Goals: Good  Visit Information  Last PT Received On: 10/17/12 Assistance Needed: +1 History of Present Illness: Pt adm with acute resp failure due to pleural effusion.  Pt with recent pacer placement with pocket hematoma.  Pt has been at [facility] for SNF.    Subjective Data  Subjective: pt sitting in recliner; agreeable to therapy; states "id like to walk. this will be my 3rd time this week" Patient Stated Goal: I really want to go to [facility] so i can get home after   Cognition  Cognition Arousal/Alertness: Awake/alert Behavior During Therapy: [facility] for  tasks assessed/performed Overall Cognitive Status: Within Functional Limits for tasks assessed    Balance  Balance Balance Assessed: Yes Static Standing Balance Static Standing - Balance Support: Bilateral upper extremity supported;During functional activity Static Standing - Level of Assistance: 5: Stand by assistance  End of Session PT - End of  Session Equipment Utilized During Treatment: Gait belt Activity Tolerance: Patient tolerated treatment well Patient left: in chair;with call bell/phone within reach Nurse Communication: Mobility status   GP     Donell Sievert, [place] [phone] 10/17/2012, 12:44 PM

## 2012-10-17 NOTE — Progress Notes (Signed)
Utilization review completed.  

## 2012-10-17 NOTE — Progress Notes (Signed)
Pt up in chair. States lt arm pain for surg on 6-27,  Does not want pain med at this time. PT last BM 7-20

## 2012-10-17 NOTE — Progress Notes (Signed)
Advanced Heart Failure Rounding Note   Subjective:    [name] is a 79 y.o. 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   She has an extensive cardiac history and presented to [facility] in early February after PEA arrest and then found to have A-fib w/ RVR. She was re-hospitalized on 09/05/12-09/14/12 for UTI, hypoxia, failure to thrive, and pleural effusions s/p thoracentesis w/ transudative findings. She was again hospitalized on 09/19/12 after LOC suddenly, thought to be d/t bradyarrhythmia at that time, as she had a heart rate as low as 26 in the ED during that admission and had been off her rate controlling medications since her previous admission earlier in the month. She had a PPM (dual-chamber Medtronic) placed by Dr. Johney Frame at that time, and the patient was discharged on 09/22/12.  She has been having SOB for a couple weeks now and says it is worse when she is changing and walking, only after a short distance. She says she had a CXR done in her nursing home ([facility]) that was thought to be a pneumonia and she was placed on antibiotics at that time, but she denies any recent fever, chills or cough productive of sputum. She denies any associated chest pain, lightheadedness, LOC, diaphoresis, orthopnea, or PND. She also had some episodes of palpitations last week, and her heart rate was found to be in the 100's, along w/ orthostatic hypotension, causing significant dizziness and weakness. At that time she went to Dr. Kandis Mannan clinic and had her ICD interrogated which showed that she was converting back and forth from  a-fib to SR. She was placed back on amiodarone 200 mg bid at that time, according to her recent clinic note.   Stable overnight. 24 hr I/O not accurate d/t patient not getting to bedside commode in time and wearing depends. SBP 100-115. Denies SOB, orthopnea or CP. Renal function stable.   Objective:   Weight Range:  Vital Signs:   Temp:  [97.8 F (36.6 C)-98.4 F (36.9 C)] 98.4 F (36.9 C) (07/25 0725) Pulse Rate:  [61-63] 63 (07/25 0725) Resp:  [18-20] 18 (07/25 0725) BP: (116-132)/(40-58) 116/41 mmHg (07/25 0725) SpO2:  [94 %-100 %] 94 % (07/25 0725) Weight:  [62.097 kg (136 lb 14.4 oz)] 62.097 kg (136 lb 14.4 oz) (07/25 0725) Last BM Date: 10/12/12  Weight change: Filed Weights   10/15/12 0520 10/16/12 0639 10/17/12 0725  Weight: 63.005 kg (138 lb 14.4 oz) 63.322 kg (139 lb 9.6 oz) 62.097 kg (136 lb 14.4 oz)    Intake/Output:   Intake/Output Summary (Last 24 hours) at 10/17/12 0801 Last data filed at 10/17/12 0549  Gross per 24 hour  Intake   1600 ml  Output    150 ml  Net   1450 ml     Physical Exam: General:  Elderly appearing. No resp difficulty; sitting in the chair HEENT: normal Neck: supple. JVP 7-8. Carotids 2+ bilat; no bruits. No lymphadenopathy or thryomegaly appreciated. Cor: PMI nondisplaced. Regular rate & rhythm. No rubs, gallops or murmurs. Lungs: Crackles at bases bilaterally.  Abdomen: soft, nontender, nondistended. No hepatosplenomegaly. No bruits or masses. Good bowel sounds. Extremities:  no cyanosis, clubbing, rash, edema Neuro: alert & orientedx3, cranial nerves grossly intact. moves all 4 extremities w/o difficulty. Affect pleasant Hematoma at [facility] site, patient states this is improved.   Telemetry: a-paced, v-sensed  Labs: Basic Metabolic Panel:  Recent Labs Lab 10/12/12 1537 10/13/12 0605 10/14/12 0940 10/16/12 1122 10/17/12 0515  NA 134* 135 133* 134* 137  K 3.7 3.6 3.9 3.7 4.1  CL 97 98 95* 95* 97  CO2 24 30 30  33* 36*   GLUCOSE 91 42* 92 154* 82  BUN 23 23 25* 26* 26*  CREATININE 1.23* 0.99 1.15* 0.98 1.10  CALCIUM 9.0 9.2 9.1 9.1 9.4  MG  --   --  1.9  --   --     Liver Function Tests:  Recent Labs Lab 10/12/12 1537 10/13/12 0605 10/14/12 0940  AST 18 18 17   ALT 11 11 10   ALKPHOS 75 75 73  BILITOT 0.7 0.7 0.8  PROT 5.8* 5.8* 5.5*  ALBUMIN 2.9* 2.8* 2.7*   No results found for this basename: LIPASE, AMYLASE,  in the last 168 hours No results found for this basename: AMMONIA,  in the last 168 hours  CBC:  Recent Labs Lab 10/12/12 1537 10/13/12 0605 10/14/12 0940  WBC 4.7 3.9* 3.8*  NEUTROABS 3.6  --  2.4  HGB 9.8* 9.9* 9.9*  HCT 29.4* 30.5* 30.4*  MCV 94.2 94.4 94.4  PLT 183 187 181    Cardiac Enzymes:  Recent Labs Lab 10/12/12 1537  TROPONINI <0.30    BNP: BNP (last 3 results)  Recent Labs  05/13/12 0535 09/06/12 0302 10/12/12 1537  PROBNP 1125.0* 3823.0* 7410.0*     Other results:  EKG:   Imaging: Dg Chest 1 View  10/15/2012   *RADIOLOGY REPORT*  Clinical Data: Status post right thoracentesis.  CHEST - 1 VIEW  Comparison: CT chest 10/13/2012 and PA and lateral chest 10/12/2012.  Findings: Right pleural effusions seen on the prior examination is markedly decreased after thoracentesis.  No pneumothorax is identified.  Small left pleural effusion is noted.  Bibasilar atelectasis is seen.  Severe degenerative disease about the shoulders is noted.  IMPRESSION:  1.  Negative for pneumothorax after right thoracentesis. 2.  Small bilateral pleural effusions and basilar atelectasis.   Original Report Authenticated By: Holley Dexter, M.D.   US Thoracentesis Asp Pleural Space W/img Guide  10/15/2012   *RADIOLOGY REPORT*  Clinical Data:  Patient with prior history of mitral valve repair, MAZE procedure for atrial fibrillation, CHF, pulmonary hypertension, right pleural effusion.  Request is made for diagnostic and therapeutic right thoracentesis.  ULTRASOUND GUIDED  DIAGNOSTIC AND THERAPEUTIC RIGHT  THORACENTESIS  An ultrasound guided thoracentesis was thoroughly discussed with the patient and questions answered.  The benefits, risks, alternatives and complications were also discussed.  The patient understands and wishes to proceed with the procedure.  Written consent was obtained.  Ultrasound was performed to localize and mark an adequate pocket of fluid in the right chest.  The area was then prepped and draped in the normal sterile fashion.  1% Lidocaine was used for local anesthesia.  Under ultrasound guidance a 19 gauge Yueh catheter was introduced.  Thoracentesis was performed.  The catheter was removed and a dressing applied.  Complications:  none  Findings: A total of approximately 680 cc's of yellow fluid was removed. A fluid sample was sent for laboratory analysis.  IMPRESSION: Successful ultrasound guided diagnostic and therapeutic right thoracentesis yielding 680 cc's of pleural fluid.  Read by:  Jeananne Rama, P.A.-C   Original Report Authenticated By: Tacey Ruiz, MD     Medications:     Scheduled Medications: . amiodarone  200 mg Oral BID  . ceFEPime (MAXIPIME) IV  1 g Intravenous Q24H  . citalopram  20 mg Oral Daily  . docusate sodium  100 mg Oral BID  . feeding supplement  237 mL Oral Q24H  . furosemide  40 mg Intravenous Q8H  . insulin aspart  0-9 Units Subcutaneous TID WC  . insulin glargine  15 Units Subcutaneous QHS  . magnesium oxide  400 mg Oral BID  . multivitamin with minerals  1 tablet Oral Daily  . nystatin   Topical BID  . rOPINIRole  0.5 mg Oral QHS  . simvastatin  20 mg Oral q1800  . sodium chloride  3 mL Intravenous Q12H  . sodium chloride  3 mL Intravenous Q12H  . traMADol  50 mg Oral QHS  . traZODone  50 mg Oral QHS  . vancomycin  1,000 mg Intravenous Q24H  . Warfarin - Pharmacist Dosing Inpatient   Does not apply q1800    Infusions:    PRN Medications: sodium chloride, acetaminophen, acetaminophen, albuterol,  alum & mag hydroxide-simeth, guaifenesin, ondansetron (ZOFRAN) IV, ondansetron, sodium chloride   Assessment/Plan  ASSESSMENT AND PLAN:  79 y.o. y/o female w/ PMHx of diastolic CHF, CAD (s/p cath in 04/2012, showed non-obstructive disease), severe MR (s/p valve repair on 05/20/12), atrial fibrillation (s/p Maze operation on 05/20/12 and DCCV on 06/06/12), PPM placement (09/19/12) for symptomatic bradycardia, pulmonary HTN, recurrent pleural effusions (thoracentesis in 08/2012), anemia, and DM, presented to the ED on 10/12/12 w/ exertional SOB.   1) Acute on chronic diastolic HF:  Her recent imaging studies show presence of bilateral pleural effusions which she has had in the past and were shown to be transudative in nature. Her most recent ECHO on 09/07/12 showed an EF of 60-65%, but given her history of diastolic CHF and previous valve disease, her pulmonary effusions and recent SOB are most likely due to her diastolic dysfunction.   - She looks near-euvolemic after IV Lasix.  Transition to 40 mg po bid, this will be her new home dose.   2) Pleural Effusion: S/P thoracentesis -680 cc.  Transudative, suspect secondary to diastolic CHF.   3) Atrial fibrillation: S/P DC/CV in March 2014. Recent ICD interrogation shows that she has had some recent episodes. She is a-paced today.  Continue amiodarone - INR 1.13, pharmacy dosing  4) s/p MV repair: Probably mild stenosis s/p repair.    5) She could potentially return to SNF today from cardiac standpoint.  Will will arrange followup in CHF clinic with BM[facility] next week.  Cardiac meds: Warfarin (needs coumadin followup), amiodarone 200 bid, Lasix 40 mg po bid, KCl 20 daily, Zocor 20 daily.   Length of Stay: 5   Marca Ancona 10/17/2012, 8:01 AM  Advanced Heart Failure Team Pager [phone] (M-F; 7a - 4p)  Please contact [facility] for night-coverage after hours (4p -7a ) and weekends on [email or web address]

## 2012-10-17 NOTE — Progress Notes (Signed)
ANTICOAGULATION CONSULT NOTE   Pharmacy Consult for coumadin Indication: atrial fibrillation  Patient Measurements: Height: 5\' 4"  (162.6 cm) Weight: 136 lb 14.4 oz (62.097 kg) (scale b) IBW/kg (Calculated) : 54.7   Vital Signs: Temp: 98.4 F (36.9 C) (07/25 0725) Temp src: Oral (07/25 0725) BP: 116/41 mmHg (07/25 0725) Pulse Rate: 63 (07/25 0725)  Labs:  Recent Labs  10/14/12 0940 10/16/12 1122 10/16/12 1721 10/17/12 0515  HGB 9.9*  --   --   --   HCT 30.4*  --   --   --   PLT 181  --   --   --   LABPROT 16.2*  --  13.2 14.3  INR 1.33  --  1.02 1.13  CREATININE 1.15* 0.98  --  1.10    Estimated Creatinine Clearance: 31.7 ml/min (by C-G formula based on Cr of 1.1).  Assessment: 79 yo lady to resume coumadin for afib.  Her admission INR was 5.25 on home regimen of 3 mg daily.  She received Vitamin K 10 mg IV on 7/21 so that she could proceed with thorancentesis. Noted patient has very large hematoma from PPM implantation, no plans for drainage.  INR this am after restarting warfarin is 1.13. No events noted overnight.  Goal of Therapy:  INR 2-3 Monitor platelets by anticoagulation protocol: Yes   Plan:  Repeat Coumadin 3 mg today.  Check daily PT/INR.  Sheppard Coil PharmD., BCPS Clinical Pharmacist Pager [phone] 10/17/2012 9:22 AM

## 2012-10-17 NOTE — Discharge Summary (Addendum)
Physician Discharge Summary  Kare Dado MRN: [id number] DOB/AGE: 07/22/1924 79 y.o.  PCP: Marylen Ponto, MD   Admit date: 10/12/2012 Discharge date: 10/17/2012  Discharge Diagnoses:      Acute respiratory failure Active Problems:   Diabetes type 2, uncontrolled   Atrial fibrillation   Pleural effusion   Depression  Followup CHF clinic with [facility] next week PCP in 1 week Fluid restriction to 1500 cc a day Needs daily INR monitoring until therapeutic    Medication List    STOP taking these medications       nystatin 100000 UNIT/GM Powd     pravastatin 40 MG tablet  Commonly known as:  PRAVACHOL      TAKE these medications       albuterol (5 MG/ML) 0.5% nebulizer solution  Commonly known as:  PROVENTIL  Take 0.5 mLs (2.5 mg total) by nebulization every 2 (two) hours as needed for wheezing.     amiodarone 200 MG tablet  Commonly known as:  PACERONE  Take 1 tablet (200 mg total) by mouth 2 (two) times daily.     cefdinir 300 MG capsule  Commonly known as:  OMNICEF  Take 2 capsules (600 mg total) by mouth 2 (two) times daily.     citalopram 20 MG tablet  Commonly known as:  CELEXA  Take 20 mg by mouth daily.     DUONEB 0.5-2.5 (3) MG/3ML Soln  Generic drug:  ipratropium-albuterol  Take 3 mLs by nebulization 4 (four) times daily as needed.     furosemide 40 MG tablet  Commonly known as:  LASIX  Take 1 tablet (40 mg total) by mouth 2 (two) times daily.     insulin glargine 100 UNIT/ML injection  Commonly known as:  LANTUS  Inject 18 Units into the skin at bedtime.     magnesium oxide 400 MG tablet  Commonly known as:  MAG-OX  Take 1 tablet (400 mg total) by mouth 2 (two) times daily.     metFORMIN 500 MG tablet  Commonly known as:  GLUCOPHAGE  Take 500 mg by mouth 2 (two) times daily with a meal.     ondansetron 4 MG tablet  Commonly known as:  ZOFRAN  Take 4 mg by mouth every 4 (four) hours as needed for nausea.     rOPINIRole 0.5 MG tablet   Commonly known as:  REQUIP  Take 0.5 mg by mouth at bedtime.     simvastatin 20 MG tablet  Commonly known as:  ZOCOR  Take 1 tablet (20 mg total) by mouth every evening.     traMADol 50 MG tablet  Commonly known as:  ULTRAM  Take 50 mg by mouth at bedtime.     traZODone 50 MG tablet  Commonly known as:  DESYREL  Take 50 mg by mouth at bedtime.     warfarin 3 MG tablet  Commonly known as:  COUMADIN  Take 3 mg by mouth daily.        Discharge Condition: Stable  Disposition: 03-Skilled Nursing Facility   Consults: Cardiology  Significant Diagnostic Studies: Dg Chest 1 View  10/15/2012   *RADIOLOGY REPORT*  Clinical Data: Status post right thoracentesis.  CHEST - 1 VIEW  Comparison: CT chest 10/13/2012 and PA and lateral chest 10/12/2012.  Findings: Right pleural effusions seen on the prior examination is markedly decreased after thoracentesis.  No pneumothorax is identified.  Small left pleural effusion is noted.  Bibasilar atelectasis is seen.  Severe degenerative  disease about the shoulders is noted.  IMPRESSION:  1.  Negative for pneumothorax after right thoracentesis. 2.  Small bilateral pleural effusions and basilar atelectasis.   Original Report Authenticated By: Holley Dexter, M.D.   Dg Chest 2 View  10/12/2012   *RADIOLOGY REPORT*  Clinical Data: Short of breath.  CHEST - 2 VIEW  Comparison: 09/20/2012  Findings: Left chest wall pacer device is noted with lead in the right atrial appendage and right ventricle.  The heart size is normal.  There are bilateral pleural effusions right greater than left.  Compared with previous exam the right pleural effusion has increased in the interval.  Atelectasis noted in the lung bases.  IMPRESSION:  1.  Bilateral pleural effusions, right greater than left. 2.  Interval increase in volume of right effusion.   Original Report Authenticated By: Signa Kell, M.D.   Dg Chest 2 View  09/20/2012   *RADIOLOGY REPORT*  Clinical Data:  Pacemaker insertion  CHEST - 2 VIEW  Comparison: 09/13/2012  Findings: Dual lead left subclavian pacemaker device and leads have been placed.  Leads are positioned in the right atrium and right ventricle.  Right pleural effusion has increased.  Dense opacity at the right base is worse with worsening shift of the mediastinum to the right.  Left lung remains relatively clear with a small stable left pleural effusion.  No evidence of pneumothorax.  IMPRESSION: Left subclavian pacemaker placement without evidence of pneumothorax.  Increasing airspace disease and volume loss at the right lung base with shift of mediastinum to the right.  Bilateral pleural effusions as described.   Original Report Authenticated By: Jolaine Click, M.D.   Ct Chest Wo Contrast  10/13/2012   *RADIOLOGY REPORT*  Clinical Data:   Question pneumonia versus pleural effusion.  CT CHEST WITHOUT CONTRAST  Technique:  Multidetector CT imaging of the chest was performed following the standard protocol without IV contrast.  Comparison: Chest radiograph 10/12/2012.  Findings: Mediastinal lymph nodes are not enlarged by CT size criteria.  Hilar regions are difficult to definitively evaluate without IV contrast.  No axillary adenopathy.  Atherosclerotic calcification of the arterial vasculature, including coronary arteries.  Pulmonary arteries and heart are enlarged.  No pericardial effusion.  Pacemaker lead tips terminate in the right atrium and right ventricle.  Mitral valve replacement.  There are bilateral pleural effusions, moderate on the right and small on the left.  Right pleural effusion is minimally loculated anterolaterally, in the inferior right hemithorax.  Associated collapse/consolidation in the right upper, right middle and right lower lobes.  Collapse/consolidation is worst in the right lower lobe.  Mild patchy collapse/consolidation in the left lower lobe. Upper lobe predominant ground-glass may be due in part to expiratory phase  imaging.  Airway is otherwise unremarkable.  Incidental imaging of the upper abdomen shows no acute findings. No worrisome lytic or sclerotic lesions.  Degenerative changes are seen in the spine and shoulders.  IMPRESSION:  1.  Bilateral pleural effusions, right greater than left, with minimal loculation inferiorly on the right. 2.  Bilateral patchy collapse/consolidation, right greater than left, likely due to a combination of pneumonia and atelectasis.   Original Report Authenticated By: Leanna Battles, M.D.   US Thoracentesis Asp Pleural Space W/img Guide  10/15/2012   *RADIOLOGY REPORT*  Clinical Data:  Patient with prior history of mitral valve repair, MAZE procedure for atrial fibrillation, CHF, pulmonary hypertension, right pleural effusion.  Request is made for diagnostic and therapeutic right thoracentesis.  ULTRASOUND GUIDED DIAGNOSTIC  AND THERAPEUTIC RIGHT  THORACENTESIS  An ultrasound guided thoracentesis was thoroughly discussed with the patient and questions answered.  The benefits, risks, alternatives and complications were also discussed.  The patient understands and wishes to proceed with the procedure.  Written consent was obtained.  Ultrasound was performed to localize and mark an adequate pocket of fluid in the right chest.  The area was then prepped and draped in the normal sterile fashion.  1% Lidocaine was used for local anesthesia.  Under ultrasound guidance a 19 gauge Yueh catheter was introduced.  Thoracentesis was performed.  The catheter was removed and a dressing applied.  Complications:  none  Findings: A total of approximately 680 cc's of yellow fluid was removed. A fluid sample was sent for laboratory analysis.  IMPRESSION: Successful ultrasound guided diagnostic and therapeutic right thoracentesis yielding 680 cc's of pleural fluid.  Read by: Jeananne Rama, P.A.-C   Original Report Authenticated By: Tacey Ruiz, MD       Microbiology: Recent Results (from the past 240  hour(s))  CULTURE, BLOOD (ROUTINE X 2)     Status: None   Collection Time    10/12/12  3:35 PM      Result Value Range Status   Specimen Description BLOOD RIGHT ARM   Final   Special Requests BOTTLES DRAWN AEROBIC ONLY 3.5CC   Final   Culture  Setup Time 10/12/2012 20:47   Final   Culture     Final   Value:        BLOOD CULTURE RECEIVED NO GROWTH TO DATE CULTURE WILL BE HELD FOR 5 DAYS BEFORE ISSUING A FINAL NEGATIVE REPORT   Report Status PENDING   Incomplete  CULTURE, BLOOD (ROUTINE X 2)     Status: None   Collection Time    10/12/12  3:45 PM      Result Value Range Status   Specimen Description BLOOD RIGHT ARM   Final   Special Requests BOTTLES DRAWN AEROBIC ONLY 4CC   Final   Culture  Setup Time 10/12/2012 20:47   Final   Culture     Final   Value:        BLOOD CULTURE RECEIVED NO GROWTH TO DATE CULTURE WILL BE HELD FOR 5 DAYS BEFORE ISSUING A FINAL NEGATIVE REPORT   Report Status PENDING   Incomplete  MRSA PCR SCREENING     Status: None   Collection Time    10/12/12  7:22 PM      Result Value Range Status   MRSA by PCR NEGATIVE  NEGATIVE Final   Comment:            The GeneXpert MRSA Assay (FDA     approved for NASAL specimens     only), is one component of a     comprehensive MRSA colonization     surveillance program. It is not     intended to diagnose MRSA     infection nor to guide or     monitor treatment for     MRSA infections.  BODY FLUID CULTURE     Status: None   Collection Time    10/15/12  3:00 PM      Result Value Range Status   Specimen Description FLUID RIGHT PLEURAL   Final   Special Requests Normal   Final   Gram Stain     Final   Value: NO WBC SEEN     NO ORGANISMS SEEN   Culture NO GROWTH 2  DAYS   Final   Report Status PENDING   Incomplete     Labs: Results for orders placed during the hospital encounter of 10/12/12 (from the past 48 hour(s))  PH, BODY FLUID     Status: None   Collection Time    10/15/12  3:00 PM      Result Value Range    pH, Fluid Type       Value: CORRECTED ON 07/23 AT 1515: PREVIOUSLY REPORTED AS LUNG   Comment: CORRECTED ON 07/24 AT 0843: PREVIOUSLY REPORTED AS FLUID RIGHT PLEURAL, CORRECTED ON 07/23 AT 1515: PREVIOUSLY REPORTED AS LUNG   pH, Fluid 8.50    LACTATE DEHYDROGENASE, BODY FLUID     Status: Abnormal   Collection Time    10/15/12  3:00 PM      Result Value Range   LD, Fluid 67 (*) 3 - 23 U/L   Fluid Type-FLDH FLUID     Comment: RIGHT     PLEURAL     CORRECTED ON 07/23 AT 1516: PREVIOUSLY REPORTED AS LUNG  PROTEIN, BODY FLUID     Status: None   Collection Time    10/15/12  3:00 PM      Result Value Range   Total protein, fluid <0.2     Comment: NO NORMAL RANGE ESTABLISHED FOR THIS TEST   Fluid Type-FTP FLUID     Comment: RIGHT     PLEURAL     CORRECTED ON 07/23 AT 1517: PREVIOUSLY REPORTED AS LUNG  BODY FLUID CELL COUNT WITH DIFFERENTIAL     Status: Abnormal   Collection Time    10/15/12  3:00 PM      Result Value Range   Fluid Type-FCT FLUID     Comment: RIGHT     PLEURAL     CORRECTED ON 07/23 AT 1515: PREVIOUSLY REPORTED AS LUNG   Color, Fluid YELLOW  YELLOW   Appearance, Fluid HAZY (*) CLEAR   WBC, Fluid 305  0 - 1000 cu mm   Neutrophil Count, Fluid 13  0 - 25 %   Lymphs, Fluid 50     Monocyte-Macrophage-Serous Fluid 37 (*) 50 - 90 %  BODY FLUID CULTURE     Status: None   Collection Time    10/15/12  3:00 PM      Result Value Range   Specimen Description FLUID RIGHT PLEURAL     Special Requests Normal     Gram Stain       Value: NO WBC SEEN     NO ORGANISMS SEEN   Culture NO GROWTH 2 DAYS     Report Status PENDING    PATHOLOGIST SMEAR REVIEW     Status: None   Collection Time    10/15/12  3:00 PM      Result Value Range   Path Review Reactive mesothelial cells.     Comment: Reviewed by H. Hollice Espy, M.D.     10/16/12  GLUCOSE, CAPILLARY     Status: Abnormal   Collection Time    10/15/12  3:53 PM      Result Value Range   Glucose-Capillary 121 (*) 70 - 99  mg/dL  GLUCOSE, CAPILLARY     Status: Abnormal   Collection Time    10/15/12 10:03 PM      Result Value Range   Glucose-Capillary 175 (*) 70 - 99 mg/dL  GLUCOSE, CAPILLARY     Status: Abnormal   Collection Time    10/16/12  6:53 AM  Result Value Range   Glucose-Capillary 119 (*) 70 - 99 mg/dL  GLUCOSE, CAPILLARY     Status: Abnormal   Collection Time    10/16/12 11:06 AM      Result Value Range   Glucose-Capillary 163 (*) 70 - 99 mg/dL   Comment 1 Notify RN    BASIC METABOLIC PANEL     Status: Abnormal   Collection Time    10/16/12 11:22 AM      Result Value Range   Sodium 134 (*) 135 - 145 mEq/L   Potassium 3.7  3.5 - 5.1 mEq/L   Chloride 95 (*) 96 - 112 mEq/L   CO2 33 (*) 19 - 32 mEq/L   Glucose, Bld 154 (*) 70 - 99 mg/dL   BUN 26 (*) 6 - 23 mg/dL   Creatinine, Ser 1.61  0.50 - 1.10 mg/dL   Calcium 9.1  8.4 - 09.6 mg/dL   GFR calc non Af Amer 51 (*) >90 mL/min   GFR calc Af Amer 59 (*) >90 mL/min   Comment:            The eGFR has been calculated     using the CKD EPI equation.     This calculation has not been     validated in all clinical     situations.     eGFR's persistently     <90 mL/min signify     possible Chronic Kidney Disease.  GLUCOSE, CAPILLARY     Status: Abnormal   Collection Time    10/16/12  4:24 PM      Result Value Range   Glucose-Capillary 133 (*) 70 - 99 mg/dL   Comment 1 Notify RN    PROTIME-INR     Status: None   Collection Time    10/16/12  5:21 PM      Result Value Range   Prothrombin Time 13.2  11.6 - 15.2 seconds   INR 1.02  0.00 - 1.49  VANCOMYCIN, TROUGH     Status: None   Collection Time    10/16/12  5:24 PM      Result Value Range   Vancomycin Tr 12.7  10.0 - 20.0 ug/mL  GLUCOSE, CAPILLARY     Status: Abnormal   Collection Time    10/16/12  8:55 PM      Result Value Range   Glucose-Capillary 206 (*) 70 - 99 mg/dL   Comment 1 Notify RN     Comment 2 Documented in Chart    BASIC METABOLIC PANEL     Status: Abnormal    Collection Time    10/17/12  5:15 AM      Result Value Range   Sodium 137  135 - 145 mEq/L   Potassium 4.1  3.5 - 5.1 mEq/L   Chloride 97  96 - 112 mEq/L   CO2 36 (*) 19 - 32 mEq/L   Glucose, Bld 82  70 - 99 mg/dL   BUN 26 (*) 6 - 23 mg/dL   Creatinine, Ser 0.45  0.50 - 1.10 mg/dL   Calcium 9.4  8.4 - 40.9 mg/dL   GFR calc non Af Amer 44 (*) >90 mL/min   GFR calc Af Amer 51 (*) >90 mL/min   Comment:            The eGFR has been calculated     using the CKD EPI equation.     This calculation has not been     validated in  all clinical     situations.     eGFR's persistently     <90 mL/min signify     possible Chronic Kidney Disease.  PROTIME-INR     Status: None   Collection Time    10/17/12  5:15 AM      Result Value Range   Prothrombin Time 14.3  11.6 - 15.2 seconds   INR 1.13  0.00 - 1.49  GLUCOSE, CAPILLARY     Status: None   Collection Time    10/17/12  6:37 AM      Result Value Range   Glucose-Capillary 91  70 - 99 mg/dL     HPI :* [name] is a 79 y.o. 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  She has an extensive cardiac history and presented to [facility] in early February after PEA arrest and then found to have A-fib w/ RVR. She was re-hospitalized on 09/05/12-09/14/12 for UTI, hypoxia, failure to thrive, and pleural effusions s/p thoracentesis w/ transudative findings. She was again hospitalized on 09/19/12 after LOC suddenly, thought to be d/t bradyarrhythmia at that time, as she had a heart rate as low as 26 in the ED during that admission and had been off her rate controlling  medications since her previous admission earlier in the month. She had a PPM (dual-chamber Medtronic) placed by Dr. Johney Frame at that time, and the patient was discharged on 09/22/12.  She has been having SOB for a couple weeks now and says it is worse when she is changing and walking, only after a short distance. She says she had a CXR done in her nursing home ([facility]) that was thought to be a pneumonia and she was placed on antibiotics at that time, but she denies any recent fever, chills or cough productive of sputum. She denies any associated chest pain, lightheadedness, LOC, diaphoresis, orthopnea, or PND. She also had some episodes of palpitations last week, and her heart rate was found to be in the 100's, along w/ orthostatic hypotension, causing significant dizziness and weakness. At that time she went to Dr. Kandis Mannan clinic and had her ICD interrogated which showed that she was converting back and forth from a-fib to SR. She was placed back on amiodarone 200 mg bid at that time, according to her recent clinic note.  Stable overnight. 24 hr I/O not accurate d/t patient not getting to bedside commode in time and wearing depends. SBP 100-115. Denies SOB, orthopnea or CP. Renal function stable  HOSPITAL COURSE:    1) Acute on chronic diastolic HF: Her recent imaging studies show presence of bilateral pleural effusions which she has had in the past and were shown to be transudative in nature. Her most recent ECHO on 09/07/12 showed an EF of 60-65%, but given her history of diastolic CHF and previous valve disease, her pulmonary effusions and recent SOB are most likely due to her diastolic dysfunction.  - She looks near-euvolemic after IV Lasix. Transition to 40 mg po bid, this will be her new home dose.  BMP in one week in the CHF clinic  2) Pleural Effusion: S/P thoracentesis -680 cc. Transudative, suspect secondary  to diastolic CHF.  3) Atrial fibrillation: S/P DC/CV in March 2014. Recent ICD  interrogation shows that she has had some recent episodes. She is a-paced today. Continue amiodarone  - INR 1.13, pharmacy dosing , initially Coumadin was held for thoracentesis 4) s/p MV repair: Probably mild stenosis s/p repair.  5.DM; currently controlled hemoglobin A1c on 09/08/2012= 5.4 6. CAP; started on vancomycin+ cefepime day 5/7, switched to [facility] for 3 more days, all cultures negative as far   Discharge Exam:    Blood pressure 119/39, pulse 62, temperature 98 F (36.7 C), temperature source Oral, resp. rate 18, height 5\' 4"  (1.626 m), weight 62.097 kg (136 lb 14.4 oz), SpO2 96.00%.  General: Elderly appearing. No resp difficulty; sitting in the chair  HEENT: normal  Neck: supple. JVP 7-8. Carotids 2+ bilat; no bruits. No lymphadenopathy or thryomegaly appreciated.  Cor: PMI nondisplaced. Regular rate & rhythm. No rubs, gallops or murmurs.  Lungs: Crackles at bases bilaterally.  Abdomen: soft, nontender, nondistended. No hepatosplenomegaly. No bruits or masses. Good bowel sounds.  Extremities: no cyanosis, clubbing, rash, edema  Neuro: alert & orientedx3, cranial nerves grossly intact. moves all 4 extremities w/o difficulty. Affect pleasant  Hematoma at [facility] site, patient states this is improved         Future Appointments Provider Department Dept Phone   12/01/2012 1:30 PM Purcell Nails, MD [facility] [phone]      Follow-up Information   Follow up with Arvilla Meres, MD On 10/23/2012. (@ 10:30 am Gate code 1000)    Contact information:   [address] [place] [address] [phone]       Follow up with Marylen Ponto, MD. Schedule an appointment as soon as possible for a visit in 1 week. (BMP in one week)    Contact information:   [address] [place], [address] [phone]       Signed: Richarda Overlie 10/17/2012, 11:29 AM

## 2012-10-17 NOTE — Progress Notes (Addendum)
Pt d/c to [facility]. Report call to RN. Family providing transport to [facility]

## 2012-10-18 LAB — CULTURE, BLOOD (ROUTINE X 2): Culture: NO GROWTH

## 2012-10-18 LAB — BODY FLUID CULTURE
Culture: NO GROWTH
Special Requests: NORMAL

## 2012-10-23 ENCOUNTER — Ambulatory Visit (HOSPITAL_COMMUNITY)
Admission: RE | Admit: 2012-10-23 | Discharge: 2012-10-23 | Disposition: A | Discharge status: Home / Self Care | Payer: Medicare Other | Source: Ambulatory Visit | Attending: Internal Medicine | Admitting: Internal Medicine

## 2012-10-23 ENCOUNTER — Encounter (HOSPITAL_COMMUNITY): Payer: Self-pay

## 2012-10-23 VITALS — BP 130/50 | HR 67 | Wt 142.4 lb

## 2012-10-23 DIAGNOSIS — I10 Essential (primary) hypertension: Secondary | ICD-10-CM | POA: Insufficient documentation

## 2012-10-23 DIAGNOSIS — I5022 Chronic systolic (congestive) heart failure: Secondary | ICD-10-CM | POA: Insufficient documentation

## 2012-10-23 DIAGNOSIS — I509 Heart failure, unspecified: Secondary | ICD-10-CM | POA: Insufficient documentation

## 2012-10-23 DIAGNOSIS — I4891 Unspecified atrial fibrillation: Secondary | ICD-10-CM | POA: Insufficient documentation

## 2012-10-23 DIAGNOSIS — E785 Hyperlipidemia, unspecified: Secondary | ICD-10-CM | POA: Insufficient documentation

## 2012-10-23 DIAGNOSIS — E119 Type 2 diabetes mellitus without complications: Secondary | ICD-10-CM | POA: Insufficient documentation

## 2012-10-23 DIAGNOSIS — Z79899 Other long term (current) drug therapy: Secondary | ICD-10-CM | POA: Insufficient documentation

## 2012-10-23 DIAGNOSIS — Z8674 Personal history of sudden cardiac arrest: Secondary | ICD-10-CM | POA: Insufficient documentation

## 2012-10-23 DIAGNOSIS — I2510 Atherosclerotic heart disease of native coronary artery without angina pectoris: Secondary | ICD-10-CM | POA: Insufficient documentation

## 2012-10-23 DIAGNOSIS — M129 Arthropathy, unspecified: Secondary | ICD-10-CM | POA: Insufficient documentation

## 2012-10-23 DIAGNOSIS — Z950 Presence of cardiac pacemaker: Secondary | ICD-10-CM | POA: Insufficient documentation

## 2012-10-23 DIAGNOSIS — I5032 Chronic diastolic (congestive) heart failure: Secondary | ICD-10-CM

## 2012-10-23 DIAGNOSIS — Z794 Long term (current) use of insulin: Secondary | ICD-10-CM | POA: Insufficient documentation

## 2012-10-23 DIAGNOSIS — Z7901 Long term (current) use of anticoagulants: Secondary | ICD-10-CM | POA: Insufficient documentation

## 2012-10-23 NOTE — Patient Instructions (Addendum)
The current medical regimen is effective;  continue present plan and medications.  Follow up in 3 weeks

## 2012-10-23 NOTE — Progress Notes (Signed)
Patient ID: [name] [name], female   DOB: [birth year], 79 y.o.   MRN: [id number] PCP:  [name] Coombs, MD - [facility]   Weight Range 138-142  Baseline proBNP     HPI: [name] [name] is a very pleasant 79 y/o woman with h/o HTN, HL, DM2, mitral regurgitation (s/p MVR 2/14), bradycardia (s/p Medtronic dual chamber pacemaker) and PAF. 09/22/12  S/P for insertion of Medtronic dual chamber pacemaker.  Experienced PEA arrest at OSH in 2/14. She was transferred to [facility]. Found to have new onset AF with RVR with normal EF and severe MR due to annular dilation. She underwent heart cath which showed non-obstructive CAD. Unfortunately this was complicated by a large left groin bleed. She eventually underwent minimally Invasive Mitral Valve Repair utilizing a 26 mm Sorin Memo 3D ring for annuloplasty and a MAZE Procedure on 05/20/12. Was discharged on 05/27/2012.  Had AFL in 06/06/12 S/P TEE Successful DC-CV. But 10/10/12 at office visit she was in and out of A fib.   09/07/12 ECHO EF 60-65% Peak PA pressure 62  Admitted to [facility] 10/12/12 through 09/2512 with increased dyspnea. Had R thoracentesis. Discharge weight 136 pounds. L upper chest  ICD site hematoma stable.   She returns for post hospital follow up with her daughter. . Complains of dyspnea with exertion. Denies dyspnea when talking or sitting in the chair. Weight at SNF 138-142 pounds. Participating in therapy. Currently at [facility] SN[facility].   ROS: All systems negative except as listed in HPI, PMH and Problem List.  Past Medical History  Diagnosis Date  . Hypertension   . Hyperlipidemia   . Type 2 diabetes mellitus   . Arthritis   . Coronary artery disease     a. Mild nonobstructive by cath 04/2012, cath complicated by groin hematoma.  . S/P mitral valve repair 05/20/2012    a. Severe MR - 04/2012: 26mm Sorin Memo 3D ring annuloplasty via right mini thoracotomy approach  . S/P Maze operation for atrial fibrillation 05/20/2012    a. 04/2012: Complete bi-atrial lesion set  using cryothermy  . Atrial fibrillation     a. Diagnosed 04/2012 in the setting of severe MR. b. s/p TEE/successful DCCV 06/06/12.  [name] [name] PEA (Pulseless electrical activity)     a. 04/2012 -> adm with afib, developed PEA, transferred to [facility] for prolonged hospitalization.   . Chronic diastolic CHF (congestive heart failure)   . Symptomatic bradycardia     a. s/p Medtronic pacemaker 08/2012 (pocket hematoma)  . Moderate to severe pulmonary hypertension   . Pleural effusion     a. s/p thoracentesis 08/2012.  [name] [name] Hypoxemia     a. During admission 08/2012 for for UTI, hypoxia, FTT and pleural effusion s/p thoracentesis with transudative findings.  . Failure to thrive     a. Admission 08/2012.    Current Outpatient Prescriptions  Medication Sig Dispense Refill  . albuterol (PROVENTIL) (5 MG/ML) 0.5% nebulizer solution Take 0.5 mLs (2.5 mg total) by nebulization every 2 (two) hours as needed for wheezing or shortness of breath (chf).  20 mL  12  . amiodarone (PACERONE) 200 MG tablet Take 1 tablet (200 mg total) by mouth 2 (two) times daily.  60 tablet  3  . citalopram (CELEXA) 20 MG tablet Take 20 mg by mouth daily.      . furosemide (LASIX) 40 MG tablet Take 1 tablet (40 mg total) by mouth 2 (two) times daily.  60 tablet  0  . insulin glargine (LANTUS) 100 UNIT/ML  injection Inject 18 Units into the skin at bedtime.      [name] [name] ipratropium-albuterol (DUONEB) 0.5-2.5 (3) MG/3ML SOLN Take 3 mLs by nebulization 4 (four) times daily as needed.      . magnesium oxide (MAG-OX) 400 MG tablet Take 1 tablet (400 mg total) by mouth 2 (two) times daily.  60 tablet  6  . metFORMIN (GLUCOPHAGE) 500 MG tablet Take 500 mg by mouth 2 (two) times daily with a meal.      . ondansetron (ZOFRAN) 4 MG tablet Take 4 mg by mouth every 4 (four) hours as needed for nausea.      [name] [name] rOPINIRole (REQUIP) 0.5 MG tablet Take 0.5 mg by mouth at bedtime.      . simvastatin (ZOCOR) 20 MG tablet Take 1 tablet (20 mg total) by mouth every evening.   30 tablet  0  . traMADol (ULTRAM) 50 MG tablet Take 50 mg by mouth at bedtime.      . traZODone (DESYREL) 50 MG tablet Take 50 mg by mouth at bedtime.      [name] [name] warfarin (COUMADIN) 3 MG tablet Take 3 mg by mouth daily.       No current facility-administered medications for this encounter.   Facility-Administered Medications Ordered in Other Encounters  Medication Dose Route Frequency Provider Last Rate Last Dose  . fentaNYL (SUBLIMAZE) injection    PRN Ellin Goodie, CRNA   50 mcg at 05/15/12 0700  . midazolam (VERSED) 5 MG/5ML injection    PRN Ellin Goodie, CRNA   0.5 mg at 05/15/12 0700     PHYSICAL EXAM: Filed Vitals:   10/23/12 1102  BP: 130/50  Pulse: 67  Weight: 142 lb 6.4 oz (64.592 kg)  SpO2: 93%    Physical Exam:  General: Elderly appearing. No resp difficulty; sitting in the chair Daughter present HEENT: normal  Neck: supple. JVP 7-8. Carotids 2+ bilat; no bruits. No lymphadenopathy or thryomegaly appreciated.  Cor: PMI nondisplaced. Regular rate & rhythm. No rubs, gallops or murmurs.  Lungs: Clear  Abdomen: soft, nontender, nondistended. No hepatosplenomegaly. No bruits or masses. Good bowel sounds.  Extremities: no cyanosis, clubbing, rash, edema  Neuro: alert & orientedx3, cranial nerves grossly intact. moves all 4 extremities w/o difficulty. Affect pleasant  Hematoma at [facility] site, improving        ASSESSMENT & PLAN:   1. Chronic Diastolic Heart Failure- 09/07/12 ECHO EF 60-65% Peak PA pressure 62 Volume status stable. Continue Lasix 40 mg twice a day.  SNF to check BMET next week and fax weights weekly to HF clinic.   2. A Fib Rate controlled. Continue amiodarone 200 mg twice a day. Continue coumadin .

## 2012-11-11 ENCOUNTER — Telehealth (HOSPITAL_COMMUNITY): Payer: Self-pay | Admitting: Cardiology

## 2012-11-11 NOTE — Telephone Encounter (Signed)
DAUGHTER CALLED WITH CONCERNS ABOUT DECREASE IN WEIGHT PT WAS D/C [facility]---WEIGHT 138 TODAY HER WEIGHT IS 133.8 FACILITY PHYSICIAN HAS HER ON LASIX 80MG  DAILY DAUGHTER IS CONCERNED ABOUT RAPID WEIGHT LOSS.  PLEASE ADVISE.

## 2012-11-11 NOTE — Telephone Encounter (Signed)
Left message to call back  

## 2012-11-14 ENCOUNTER — Encounter (HOSPITAL_COMMUNITY): Payer: Self-pay

## 2012-11-14 ENCOUNTER — Ambulatory Visit (HOSPITAL_COMMUNITY)
Admission: RE | Admit: 2012-11-14 | Discharge: 2012-11-14 | Disposition: A | Discharge status: Home / Self Care | Payer: Medicare Other | Source: Ambulatory Visit | Attending: Internal Medicine | Admitting: Internal Medicine

## 2012-11-14 VITALS — BP 156/62 | HR 95 | Wt 139.4 lb

## 2012-11-14 DIAGNOSIS — E785 Hyperlipidemia, unspecified: Secondary | ICD-10-CM | POA: Insufficient documentation

## 2012-11-14 DIAGNOSIS — I5032 Chronic diastolic (congestive) heart failure: Secondary | ICD-10-CM

## 2012-11-14 DIAGNOSIS — I4891 Unspecified atrial fibrillation: Secondary | ICD-10-CM

## 2012-11-14 DIAGNOSIS — I2510 Atherosclerotic heart disease of native coronary artery without angina pectoris: Secondary | ICD-10-CM | POA: Insufficient documentation

## 2012-11-14 DIAGNOSIS — Z950 Presence of cardiac pacemaker: Secondary | ICD-10-CM | POA: Insufficient documentation

## 2012-11-14 DIAGNOSIS — I059 Rheumatic mitral valve disease, unspecified: Secondary | ICD-10-CM | POA: Insufficient documentation

## 2012-11-14 DIAGNOSIS — I498 Other specified cardiac arrhythmias: Secondary | ICD-10-CM | POA: Insufficient documentation

## 2012-11-14 DIAGNOSIS — R634 Abnormal weight loss: Secondary | ICD-10-CM | POA: Insufficient documentation

## 2012-11-14 DIAGNOSIS — I10 Essential (primary) hypertension: Secondary | ICD-10-CM | POA: Insufficient documentation

## 2012-11-14 DIAGNOSIS — E119 Type 2 diabetes mellitus without complications: Secondary | ICD-10-CM | POA: Insufficient documentation

## 2012-11-14 DIAGNOSIS — Z794 Long term (current) use of insulin: Secondary | ICD-10-CM | POA: Insufficient documentation

## 2012-11-14 DIAGNOSIS — Z79899 Other long term (current) drug therapy: Secondary | ICD-10-CM | POA: Insufficient documentation

## 2012-11-14 MED ORDER — FUROSEMIDE 40 MG PO TABS: 40.0000 mg | ORAL_TABLET | ORAL | Status: DC | PRN

## 2012-11-14 NOTE — Progress Notes (Signed)
Patient ID: [name] [name], female   DOB: October 21, 1924, 79 y.o.   MRN: [id number] PCP:  Roe Coombs, MD - [facility]   Weight Range 138-140  Baseline proBNP     HPI: [name] [name] is a very pleasant 79 y/o woman with h/o HTN, HL, DM2, mitral regurgitation (s/p MVR 2/14), bradycardia (s/p Medtronic dual chamber pacemaker) and PAF. 09/22/12  S/P for insertion of Medtronic dual chamber pacemaker.  Experienced PEA arrest at OSH in 2/14. She was transferred to [facility]. Found to have new onset AF with RVR with normal EF and severe MR due to annular dilation. She underwent heart cath which showed non-obstructive CAD. Unfortunately this was complicated by a large left groin bleed. She eventually underwent minimally Invasive Mitral Valve Repair utilizing a 26 mm Sorin Memo 3D ring for annuloplasty and a MAZE Procedure on 05/20/12. Was discharged on 05/27/2012.  Had AFL in 06/06/12 S/P TEE Successful DC-CV. But 10/10/12 at office visit she was in and out of A fib.   09/07/12 ECHO EF 60-65% Peak PA pressure 62  Admitted to [facility] 10/12/12 through 09/2512 with increased dyspnea. Had R thoracentesis. Discharge weight 136 pounds. L upper chest  ICD site hematoma stable.   She returns for follow up with her daughter. Discharged from [facility] 11/06/12. (At the SNF she was taking 100 mg lasix daily. Weights at SNF low 130s with dizziness. Yesterday she was evaluated by PCP and instructed to only take 40 mg of lasix if weight is 142 pounds (office weight was 138 pounds). Weight at home 134-136 pounds.  Denies SOB/PND/Orthopnea.   Followed by [facility]. Following low salt diet and limiting fludi intake to < 2 liters per day.   ROS: All systems negative except as listed in HPI, PMH and Problem List.  Past Medical History  Diagnosis Date  . Hypertension   . Hyperlipidemia   . Type 2 diabetes mellitus   . Arthritis   . Coronary artery disease     a. Mild nonobstructive by cath 04/2012, cath complicated by groin hematoma.  . S/P  mitral valve repair 05/20/2012    a. Severe MR - 04/2012: 26mm Sorin Memo 3D ring annuloplasty via right mini thoracotomy approach  . S/P Maze operation for atrial fibrillation 05/20/2012    a. 04/2012: Complete bi-atrial lesion set using cryothermy  . Atrial fibrillation     a. Diagnosed 04/2012 in the setting of severe MR. b. s/p TEE/successful DCCV 06/06/12.  Marland Kitchen PEA (Pulseless electrical activity)     a. 04/2012 -> adm with afib, developed PEA, transferred to [facility] for prolonged hospitalization.   . Chronic diastolic CHF (congestive heart failure)   . Symptomatic bradycardia     a. s/p Medtronic pacemaker 08/2012 (pocket hematoma)  . Moderate to severe pulmonary hypertension   . Pleural effusion     a. s/p thoracentesis 08/2012.  Marland Kitchen Hypoxemia     a. During admission 08/2012 for for UTI, hypoxia, FTT and pleural effusion s/p thoracentesis with transudative findings.  . Failure to thrive     a. Admission 08/2012.    Current Outpatient Prescriptions  Medication Sig Dispense Refill  . amiodarone (PACERONE) 200 MG tablet Take 1 tablet (200 mg total) by mouth 2 (two) times daily.  60 tablet  3  . citalopram (CELEXA) 20 MG tablet Take 20 mg by mouth daily.      . furosemide (LASIX) 40 MG tablet Only take if wt 142 or greater      .  insulin glargine (LANTUS) 100 UNIT/ML injection Inject 18 Units into the skin at bedtime. IF CBG ELEVATED      . magnesium oxide (MAG-OX) 400 MG tablet Take 1 tablet (400 mg total) by mouth 2 (two) times daily.  60 tablet  6  . ondansetron (ZOFRAN) 4 MG tablet Take 4 mg by mouth every 4 (four) hours as needed for nausea.      Marland Kitchen rOPINIRole (REQUIP) 0.5 MG tablet Take 0.5 mg by mouth at bedtime.      . simvastatin (ZOCOR) 20 MG tablet Take 1 tablet (20 mg total) by mouth every evening.  30 tablet  0  . traMADol (ULTRAM) 50 MG tablet Take 50 mg by mouth at bedtime.      . traZODone (DESYREL) 50 MG tablet Take 50 mg by mouth at bedtime.      Marland Kitchen warfarin (COUMADIN) 3 MG tablet Take  3 mg by mouth daily.       No current facility-administered medications for this encounter.   Facility-Administered Medications Ordered in Other Encounters  Medication Dose Route Frequency Provider Last Rate Last Dose  . fentaNYL (SUBLIMAZE) injection    PRN Ellin Goodie, CRNA   50 mcg at 05/15/12 0700  . midazolam (VERSED) 5 MG/5ML injection    PRN Ellin Goodie, CRNA   0.5 mg at 05/15/12 0700     PHYSICAL EXAM: Filed Vitals:   11/14/12 0920  BP: 156/62  Pulse: 95  Weight: 139 lb 6.4 oz (63.231 kg)  SpO2: 94%    Physical Exam:  General: Elderly appearing. No resp difficulty; sitting in the chair Daughter present HEENT: normal  Neck: supple. JVP flat. Carotids 2+ bilat; no bruits. No lymphadenopathy or thryomegaly appreciated.  Cor: PMI nondisplaced. Regular rate & rhythm. No rubs, gallops or murmurs.  Lungs: Clear  Abdomen: soft, nontender, nondistended. No hepatosplenomegaly. No bruits or masses. Good bowel sounds.  Extremities: no cyanosis, clubbing, rash, edema  Neuro: alert & orientedx3, cranial nerves grossly intact. moves all 4 extremities w/o difficulty. Affect pleasant          ASSESSMENT & PLAN:   1. Chronic Diastolic Heart Failure- 09/07/12 ECHO EF 60-65% Peak PA pressure 62 Volume status stable. Agree with PCP due to weight loss at SNF and home that we need to adjust diuretics. Continue Lasix 40 mg daily only for weight of 140 pounds or greater.  Her daughter is able to adjust diuretics as needed and will cal next week to go over her weights. I suspect she will need daily lasix.  Reinforced limiting fluid intake to < 2 liters per day, low sat food choices, and daily weights.  BMET 10/29/12. Renal function ok.   2. A Fib Rate controlled. Continue amiodarone 200 mg twice a day. Continue coumadin . INR followed by PCP   Follow up in 3 weeks to reassess volume status.   CLEGG,AMY NP-C 11:26 AM

## 2012-11-14 NOTE — Patient Instructions (Addendum)
Follow up in 3 weeks  Only take lasix if your weight is 140 pounds or greater  Do the following things EVERYDAY: 1) Weigh yourself in the morning before breakfast. Write it down and keep it in a log. 2) Take your medicines as prescribed 3) Eat low salt foods-Limit salt (sodium) to 2000 mg per day.  4) Stay as active as you can everyday 5) Limit all fluids for the day to less than 2 liters

## 2012-11-14 NOTE — Telephone Encounter (Signed)
Pt seen 11/14/12

## 2012-11-17 ENCOUNTER — Telehealth (HOSPITAL_COMMUNITY): Payer: Self-pay | Admitting: Cardiology

## 2012-11-17 NOTE — Telephone Encounter (Signed)
daughter called with update since lasix was d/c'd at last office visit Weight 138.8lb (up 2lbs) and b/p 184/81

## 2012-11-18 ENCOUNTER — Telehealth (HOSPITAL_COMMUNITY): Payer: Self-pay | Admitting: Anesthesiology

## 2012-11-18 MED ORDER — LOSARTAN POTASSIUM 25 MG PO TABS: 25.0000 mg | ORAL_TABLET | Freq: Every day | ORAL | Status: DC

## 2012-11-18 NOTE — Telephone Encounter (Signed)
Talked to Ms. Bickhart to discuss her weight gain and high BP. Her weight gain is ok and reiterated the need to only take lasix 40 mg if weight greater than or equal to 140 lbs. Will start her on losartan 25 mg daily and sent prescription to [facility]. Will get labs in 1 week. She states she understands.

## 2012-12-01 ENCOUNTER — Encounter: Payer: Medicare Other | Admitting: Thoracic Surgery (Cardiothoracic Vascular Surgery)

## 2012-12-02 ENCOUNTER — Ambulatory Visit (HOSPITAL_COMMUNITY)
Admission: RE | Admit: 2012-12-02 | Discharge: 2012-12-02 | Disposition: A | Discharge status: Home / Self Care | Payer: Medicare Other | Source: Ambulatory Visit | Attending: Internal Medicine | Admitting: Internal Medicine

## 2012-12-02 VITALS — BP 156/64 | HR 100 | Wt 144.8 lb

## 2012-12-02 DIAGNOSIS — I4891 Unspecified atrial fibrillation: Secondary | ICD-10-CM

## 2012-12-02 DIAGNOSIS — I5032 Chronic diastolic (congestive) heart failure: Secondary | ICD-10-CM

## 2012-12-02 LAB — BASIC METABOLIC PANEL
BUN: 28 mg/dL — ABNORMAL HIGH (ref 6–23)
Chloride: 101 mEq/L (ref 96–112)
Creatinine, Ser: 1.13 mg/dL — ABNORMAL HIGH (ref 0.50–1.10)
GFR calc Af Amer: 50 mL/min — ABNORMAL LOW (ref >90)
GFR calc non Af Amer: 43 mL/min — ABNORMAL LOW (ref >90)
Potassium: 4.5 mEq/L (ref 3.5–5.1)

## 2012-12-02 LAB — CBC
MCHC: 33.5 g/dL (ref 30.0–36.0)
RDW: 15 % (ref 11.5–15.5)
WBC: 5.9 10*3/uL (ref 4.0–10.5)

## 2012-12-02 LAB — PROTIME-INR
INR: 4.47 — ABNORMAL HIGH (ref 0.00–1.49)
Prothrombin Time: 40.8 seconds — ABNORMAL HIGH (ref 11.6–15.2)

## 2012-12-02 MED ORDER — FUROSEMIDE 40 MG PO TABS: 80.0000 mg | ORAL_TABLET | Freq: Every day | ORAL | Status: DC

## 2012-12-02 NOTE — Patient Instructions (Addendum)
Take an extra 40 mg lasix tonight when you get home.  Increase lasix to 80 mg daily only if weight is greater than 140 lbs.  Call any increased SOB or weight gain.  Will call with lab results.  Follow up in 3-6 weeks.  Do the following things EVERYDAY: 1) Weigh yourself in the morning before breakfast. Write it down and keep it in a log. 2) Take your medicines as prescribed 3) Eat low salt foods-Limit salt (sodium) to 2000 mg per day.  4) Stay as active as you can everyday 5) Limit all fluids for the day to less than 2 liters 6)

## 2012-12-02 NOTE — Progress Notes (Signed)
Patient ID: [name] [name], female   DOB: 1924/08/11, 79 y.o.   MRN: [id number] PCP:  Roe Coombs, MD - [facility]   Weight Range 138-140  Baseline proBNP     HPI: [name] [name] is a very pleasant 79 y/o woman with h/o HTN, HL, DM2, mitral regurgitation (s/p MVR 2/14), bradycardia (s/p Medtronic dual chamber pacemaker) and PAF. 09/22/12  S/P for insertion of Medtronic dual chamber pacemaker.  Experienced PEA arrest at OSH in 2/14. She was transferred to [facility]. Found to have new onset AF with RVR with normal EF and severe MR due to annular dilation. She underwent heart cath which showed non-obstructive CAD. Unfortunately this was complicated by a large left groin bleed. She eventually underwent minimally Invasive Mitral Valve Repair utilizing a 26 mm Sorin Memo 3D ring for annuloplasty and a MAZE Procedure on 05/20/12. Was discharged on 05/27/2012.  Had AFL in 06/06/12 S/P TEE Successful DC-CV. But 10/10/12 at office visit she was in and out of A fib.   09/07/12 ECHO EF 60-65% Peak PA pressure 62  Admitted to [facility] 10/12/12 through 09/2512 with increased dyspnea. Had R thoracentesis. Discharge weight 136 pounds. L upper chest  ICD site hematoma stable.   Follow up: With daughter. Last visit changed diuretics to 40 mg daily for weight of 140 pounds or greater. Her daughter called concerning increase in SBP and losartan 25 mg daily was started. SBP 140-150/60s and weight at home 140-142 lbs. She takes 60 mg lasix daily, but will take 80 mg as needed for increased weight gain. Denies PND or Orthopnea. + SOB with exertion.  Followed by [facility]. Following low salt diet and limiting fludi intake to < 2 liters per day.   ROS: All systems negative except as listed in HPI, PMH and Problem List.  Past Medical History  Diagnosis Date  . Hypertension   . Hyperlipidemia   . Type 2 diabetes mellitus   . Arthritis   . Coronary artery disease     a. Mild nonobstructive by cath 04/2012, cath complicated by groin  hematoma.  . S/P mitral valve repair 05/20/2012    a. Severe MR - 04/2012: 26mm Sorin Memo 3D ring annuloplasty via right mini thoracotomy approach  . S/P Maze operation for atrial fibrillation 05/20/2012    a. 04/2012: Complete bi-atrial lesion set using cryothermy  . Atrial fibrillation     a. Diagnosed 04/2012 in the setting of severe MR. b. s/p TEE/successful DCCV 06/06/12.  Marland Kitchen PEA (Pulseless electrical activity)     a. 04/2012 -> adm with afib, developed PEA, transferred to [facility] for prolonged hospitalization.   . Chronic diastolic CHF (congestive heart failure)   . Symptomatic bradycardia     a. s/p Medtronic pacemaker 08/2012 (pocket hematoma)  . Moderate to severe pulmonary hypertension   . Pleural effusion     a. s/p thoracentesis 08/2012.  Marland Kitchen Hypoxemia     a. During admission 08/2012 for for UTI, hypoxia, FTT and pleural effusion s/p thoracentesis with transudative findings.  . Failure to thrive     a. Admission 08/2012.    Current Outpatient Prescriptions  Medication Sig Dispense Refill  . amiodarone (PACERONE) 200 MG tablet Take 1 tablet (200 mg total) by mouth 2 (two) times daily.  60 tablet  3  . citalopram (CELEXA) 20 MG tablet Take 20 mg by mouth daily.      . furosemide (LASIX) 40 MG tablet Take 60 mg by mouth daily. Only take if wt  140 or greater      . insulin glargine (LANTUS) 100 UNIT/ML injection Inject 18 Units into the skin at bedtime. IF CBG ELEVATED      . losartan (COZAAR) 25 MG tablet Take 1 tablet (25 mg total) by mouth daily.  90 tablet  3  . magnesium oxide (MAG-OX) 400 MG tablet Take 1 tablet (400 mg total) by mouth 2 (two) times daily.  60 tablet  6  . rOPINIRole (REQUIP) 0.5 MG tablet Take 0.5 mg by mouth at bedtime.      . simvastatin (ZOCOR) 20 MG tablet Take 1 tablet (20 mg total) by mouth every evening.  30 tablet  0  . traMADol (ULTRAM) 50 MG tablet Take 50 mg by mouth at bedtime.      . traZODone (DESYREL) 50 MG tablet Take 50 mg by mouth at bedtime.      Marland Kitchen  warfarin (COUMADIN) 3 MG tablet Take 3 mg by mouth daily.       No current facility-administered medications for this encounter.   Facility-Administered Medications Ordered in Other Encounters  Medication Dose Route Frequency Provider Last Rate Last Dose  . fentaNYL (SUBLIMAZE) injection    PRN Ellin Goodie, CRNA   50 mcg at 05/15/12 0700  . midazolam (VERSED) 5 MG/5ML injection    PRN Ellin Goodie, CRNA   0.5 mg at 05/15/12 0700   PHYSICAL EXAM: Filed Vitals:   12/02/12 1509  BP: 156/64  Pulse: 100  Weight: 144 lb 12 oz (65.658 kg)  SpO2: 95%   Physical Exam:  General: Elderly appearing. No resp difficulty; sitting in the chair Daughter present HEENT: normal  Neck: supple. JVP 9-10. Carotids 2+ bilat; no bruits. No lymphadenopathy or thryomegaly appreciated.  Cor: PMI nondisplaced. Regular rate & rhythm. No rubs, gallops or murmurs.  Lungs: Clear  Abdomen: soft, nontender, nondistended. No hepatosplenomegaly. No bruits or masses. Good bowel sounds.  Extremities: no cyanosis, clubbing, rash, 3+ edema  Neuro: alert & orientedx3, cranial nerves grossly intact. moves all 4 extremities w/o difficulty. Affect pleasant     ASSESSMENT & PLAN:  1. Chronic Diastolic Heart Failure- 09/07/12 ECHO EF 60-65% Peak PA pressure 62 - NYHA III symptoms and volume status elevated with 3+ pitting LE edema. Have asked the patient to take an extra 40 mg lasix tonight and increase daily lasix dose to 80 mg from 60 mg. She is instructed to hold if weight is less than 140 lbs.  - Discussed the use of sliding scale diuretics and patient's daughter states she understands. - Reinforced the need and importance of daily weights, a low sodium diet, and fluid restriction (less than 2 L a day). Instructed to call the HF clinic if weight increases more than 3 lbs overnight or 5 lbs in a week.  - Will get BMET today.  2. A Fib Rate controlled. Will discuss with Dr. Gala Romney about decreasing amiodarone to 200  mg daily. Patient has multiple large bruises on her arms and appears to have a subconjunctival hemorrhage of her L eye. Her coumadin is managed by PCP, will get INR and CBC today and fax to them to address.    Follow up in 3-6 weeks   Ulla Potash B NP-C 6:08 PM

## 2012-12-05 ENCOUNTER — Telehealth (HOSPITAL_COMMUNITY): Payer: Self-pay | Admitting: Cardiology

## 2012-12-05 NOTE — Telephone Encounter (Signed)
DAUGHTER WAS GIVEN INSTRUCTIONS TO D/C COUMADIN ON 9/9 AS INR WAS HIGH (4.47) RESULTS WERE FAXED TO PTS PCP TO HAVE DOSING ADJUSTED HOWEVER PT HAD NOT RECEIVED A PHONE CALL.  PT DAUGHTER STATES SHE TOOK 3MG  9/9, HELD DOSE 9/10, 1MG  9/11  PER VO ALI COSGROVE, NP  PT SHOULD TAKE 1MG  9/12, 3MG  9/13 AND 3MG  9/15. SHOULD CONTACT PCP OFFICE FIRST THING 9/16 TO HAVE LAB RECHECKED AND DOSING

## 2012-12-08 ENCOUNTER — Encounter: Payer: Medicare Other | Admitting: Thoracic Surgery (Cardiothoracic Vascular Surgery)

## 2012-12-09 ENCOUNTER — Telehealth (HOSPITAL_COMMUNITY): Payer: Self-pay | Admitting: Cardiology

## 2012-12-09 NOTE — Telephone Encounter (Signed)
Will check with pharmacist.

## 2012-12-09 NOTE — Telephone Encounter (Signed)
Called to inform physician of potential interaction between pacerone and citalopram Ok to still fill meds

## 2012-12-10 ENCOUNTER — Other Ambulatory Visit (HOSPITAL_COMMUNITY): Payer: Self-pay | Admitting: Internal Medicine

## 2012-12-10 NOTE — Telephone Encounter (Signed)
Ok per Leota Sauers, phrmD as long as monitor qt,[facility] aware will obtain E[facility] at next ov

## 2012-12-12 ENCOUNTER — Other Ambulatory Visit: Payer: Self-pay

## 2012-12-12 MED ORDER — AMIODARONE HCL 200 MG PO TABS: 200.0000 mg | ORAL_TABLET | Freq: Two times a day (BID) | ORAL | Status: DC

## 2012-12-31 ENCOUNTER — Encounter (HOSPITAL_COMMUNITY): Payer: Medicare Other

## 2013-01-05 ENCOUNTER — Encounter: Payer: Self-pay | Admitting: Thoracic Surgery (Cardiothoracic Vascular Surgery)

## 2013-01-05 ENCOUNTER — Ambulatory Visit (INDEPENDENT_AMBULATORY_CARE_PROVIDER_SITE_OTHER): Payer: Medicare Other | Admitting: Thoracic Surgery (Cardiothoracic Vascular Surgery)

## 2013-01-05 VITALS — BP 136/71 | HR 82 | Resp 16 | Ht 64.0 in | Wt 139.0 lb

## 2013-01-05 DIAGNOSIS — Z9889 Other specified postprocedural states: Secondary | ICD-10-CM

## 2013-01-05 DIAGNOSIS — Z8679 Personal history of other diseases of the circulatory system: Secondary | ICD-10-CM

## 2013-01-05 NOTE — Progress Notes (Signed)
[address]       [name] [name] [address]             [phone]     CARDIOTHORACIC SURGERY OFFICE NOTE  Referring Provider is  PCP is Marylen Ponto, MD   HPI:  Patient returns for routine followup and rhythm check status post minimally invasive mitral valve repair and Maze procedure on 05/20/2012. She was last seen here in our office on 09/01/2012. Since then she underwent followup echocardiogram demonstrating normal left ventricular systolic function was intact mitral ring annuloplasty, mild to moderate residual mitral regurgitation with moderate tricuspid regurgitation and moderately elevated PA pressure.  At one point amiodarone was discontinued but she developed recurrence of atrial fibrillation and was restarted on amiodarone.   She was hospitalized in July for acute exacerbation of chronic diastolic congestive heart failure. Since then she has remained fairly stable and she is followed carefully through the heart failure clinic. She is back home alone living independently. She has stable exertional shortness of breath with bilateral lower extremity edema. She's not having any tachypalpitations or dizzy spells. Overall she is pleased with her current functional status.   Current Outpatient Prescriptions  Medication Sig Dispense Refill  . amiodarone (PACERONE) 200 MG tablet Take 1 tablet (200 mg total) by mouth 2 (two) times daily.  60 tablet  3  . citalopram (CELEXA) 20 MG tablet Take 20 mg by mouth daily.      . furosemide (LASIX) 40 MG tablet Take 2 tablets (80 mg total) by mouth daily. Only take if wt 140 or greater  60 tablet  6  . insulin glargine (LANTUS) 100 UNIT/ML injection Inject 18 Units into the skin at bedtime. IF CBG ELEVATED      . losartan (COZAAR) 25 MG tablet Take 1 tablet (25 mg total) by mouth daily.  90 tablet  3  . magnesium oxide (MAG-OX) 400 MG tablet Take 1 tablet (400 mg total) by mouth 2 (two) times daily.  60 tablet  6  . rOPINIRole  (REQUIP) 0.5 MG tablet Take 0.5 mg by mouth at bedtime.      . simvastatin (ZOCOR) 20 MG tablet Take 1 tablet (20 mg total) by mouth every evening.  30 tablet  0  . traMADol (ULTRAM) 50 MG tablet Take 50 mg by mouth at bedtime.      . traZODone (DESYREL) 50 MG tablet Take 50 mg by mouth at bedtime.      Marland Kitchen warfarin (COUMADIN) 3 MG tablet Take 3 mg by mouth daily.       No current facility-administered medications for this visit.   Facility-Administered Medications Ordered in Other Visits  Medication Dose Route Frequency Provider Last Rate Last Dose  . fentaNYL (SUBLIMAZE) injection    PRN Ellin Goodie, CRNA   50 mcg at 05/15/12 0700  . midazolam (VERSED) 5 MG/5ML injection    PRN Ellin Goodie, CRNA   0.5 mg at 05/15/12 0700      Physical Exam:   There were no vitals taken for this visit.  General:  Frail but well-appearing  Chest:   Clear to auscultation with diminished breath sounds at both lung bases  CV:   Regular rate and rhythm without murmur  Incisions:  Completely healed  Abdomen:  Soft and nontender  Extremities:  Warm and well-perfused with mild to moderate bilateral lower extremity edema  Diagnostic Tests:  2 channel telemetry rhythm strip demonstrates what appears to be  atrial paced rhythm   Transthoracic Echocardiography  Patient: [name], [name] MR #: [id number] Study Date: 09/07/2012 Gender: F Age: 79 Height: 161.3cm Weight: 66.7kg BSA: 1.75m^2 Pt. Status: Room: A03C  PERFORMING Spearman, [facility] Sharmon Leyden, MD [facility] ORDERING Rollene Rotunda SONOGRAPHER Christy Little, RCS cc:  ------------------------------------------------------------ LV EF: 60% - 65%  ------------------------------------------------------------ Indications: Mitral regurgitation 424.0.  ------------------------------------------------------------ History: PMH: Atrial fibrillation. Risk factors: Diabetes  mellitus.  ------------------------------------------------------------ Study Conclusions  - Left ventricle: The cavity size was normal. Systolic function was normal. The estimated ejection fraction was in the range of 60% to 65%. Wall motion was normal; there were no regional wall motion abnormalities. - Aortic valve: Mild regurgitation. - Mitral valve: Reported prior MV repair. Thickening and calcification noted of both anterior and psoterior leaflets. Possible mitral annular calcification also. Prior procedures included surgical repair. An annular ring prosthesis was present. Transvalvular velocity was increased, due to stenosis. The findings are consistent with moderate stenosis. Mild to moderate regurgitation. Valve area by continuity equation (using LVOT flow): 1.11cm^2. - Right atrium: The atrium was mildly dilated. - Tricuspid valve: Moderate regurgitation. - Pulmonary arteries: Systolic pressure was moderately increased. PA peak pressure: 62mm Hg (S).  ------------------------------------------------------------ Labs, prior tests, procedures, and surgery: Valve surgery. Mitral valve repair. Maze procedure.  Transthoracic echocardiography. M-mode, complete 2D, spectral Doppler, and color Doppler. Height: Height: 161.3cm. Height: 63.5in. Weight: Weight: 66.7kg. Weight: 146.7lb. Body mass index: BMI: 25.6kg/m^2. Body surface area: BSA: 1.58m^2. Blood pressure: 100/74. Patient status: Inpatient. Location: ICU/CCU  ------------------------------------------------------------  ------------------------------------------------------------ Left ventricle: The cavity size was normal. Systolic function was normal. The estimated ejection fraction was in the range of 60% to 65%. Wall motion was normal; there were no regional wall motion abnormalities.  ------------------------------------------------------------ Aortic valve: Trileaflet; normal thickness leaflets. Mobility  was not restricted. Doppler: Transvalvular velocity was within the normal range. There was no stenosis. Mild regurgitation.  ------------------------------------------------------------ Aorta: Aortic root: The aortic root was normal in size.  ------------------------------------------------------------ Mitral valve: Reported prior MV repair. Thickening and calcification noted of both anterior and psoterior leaflets. Possible mitral annular calcification also. Prior procedures included surgical repair. An annular ring prosthesis was present. Mobility was not restricted. Doppler: Transvalvular velocity was increased, due to stenosis. The findings are consistent with moderate stenosis. Mild to moderate regurgitation. Valve area by continuity equation (using LVOT flow): 1.11cm^2. Indexed valve area by continuity equation (using LVOT flow): 0.64cm^2/m^2. Mean gradient: 8mm Hg (D). Peak gradient: 22mm Hg (D).  ------------------------------------------------------------ Left atrium: The atrium was normal in size.  ------------------------------------------------------------ Right ventricle: The cavity size was normal. Wall thickness was normal. Systolic function was normal.  ------------------------------------------------------------ Pulmonic valve: Not well visualized. Doppler: Transvalvular velocity was within the normal range. There was no evidence for stenosis.  ------------------------------------------------------------ Tricuspid valve: Structurally normal valve. Doppler: Transvalvular velocity was within the normal range. Moderate regurgitation.  ------------------------------------------------------------ Pulmonary artery: The main pulmonary artery was normal-sized. Systolic pressure was moderately increased.  ------------------------------------------------------------ Right atrium: The atrium was mildly  dilated.  ------------------------------------------------------------ Pericardium: There was no pericardial effusion.  ------------------------------------------------------------ Systemic veins: Inferior vena cava: The vessel was normal in size.  ------------------------------------------------------------  2D measurements Normal Doppler measurements Normal Left ventricle Main pulmonary LVID ED, 38.4 mm 43-52 artery chord, Pressure, 62 mm Hg =30 PLAX S LVID ES, 24.9 mm 23-38 LVOT chord, Peak vel, 107 cm/s ------ PLAX S FS, chord, 35 % >29 VTI, S 21.7 cm ------ PLAX Aortic valve LVPW, ED 9.05 mm ------ Regurg PHT 318 ms ------ IVS/LVPW 0.94 <1.3  Mitral valve ratio, ED Peak E vel 201 cm/s ------ Ventricular septum Peak A vel 101 cm/s ------ IVS, ED 8.51 mm ------ Mean vel, 127 cm/s ------ LVOT D Diam, S 18 mm ------ Decelerati 232 ms 150-23 Area 2.54 cm^2 ------ on time 0 Aorta Mean 8 mm Hg ------ Root diam, 30 mm ------ gradient, ED D Left atrium Peak 22 mm Hg ------ AP dim 49 mm ------ gradient, AP dim 2.81 cm/m^2 <2.2 D index Peak E/A 2 ------ ratio Area 1.11 cm^2 ------ (LVOT) continuity Area index 0.64 cm^2/m ------ (LVOT ^2 cont) Annulus 49.6 cm ------ VTI Tricuspid valve Regurg 376 cm/s ------ peak vel Peak RV-RA 57 mm Hg ------ gradient, S Systemic veins Estimated 5 mm Hg ------ CVP Right ventricle Pressure, 62 mm Hg <30 S  ------------------------------------------------------------ Prepared and Electronically Authenticated by  Ellwood Handler 2014-06-15T15:44:[id number]    Impression:  Patient is clinically stable more than 6 months following minimally invasive mitral ring annuloplasty and Maze procedure. At present she appears to be maintaining atrial paced rhythm. She has chronic diastolic congestive heart failure that appears reasonably stable. She is followed carefully to the heart failure clinic.    Plan:  The patient will return in  6 months for routine followup and rhythm check. She will continue to follow closely through the heart failure clinic for adjustments to her medical therapy.   Salvatore Decent. Cornelius Moras, MD 01/05/2013 2:26 PM

## 2013-01-13 ENCOUNTER — Encounter (HOSPITAL_COMMUNITY): Payer: Medicare Other

## 2013-01-28 ENCOUNTER — Ambulatory Visit (HOSPITAL_COMMUNITY)
Admission: RE | Admit: 2013-01-28 | Discharge: 2013-01-28 | Disposition: A | Discharge status: Home / Self Care | Payer: Medicare Other | Source: Ambulatory Visit | Attending: Internal Medicine | Admitting: Internal Medicine

## 2013-01-28 VITALS — BP 147/59 | HR 97 | Wt 144.0 lb

## 2013-01-28 DIAGNOSIS — I5032 Chronic diastolic (congestive) heart failure: Secondary | ICD-10-CM | POA: Insufficient documentation

## 2013-01-28 DIAGNOSIS — Z9889 Other specified postprocedural states: Secondary | ICD-10-CM

## 2013-01-28 DIAGNOSIS — I4891 Unspecified atrial fibrillation: Secondary | ICD-10-CM

## 2013-01-28 MED ORDER — FUROSEMIDE 40 MG PO TABS: ORAL_TABLET | ORAL | Status: DC

## 2013-01-28 MED ORDER — AMIODARONE HCL 200 MG PO TABS: 200.0000 mg | ORAL_TABLET | Freq: Every day | ORAL | Status: DC

## 2013-01-28 NOTE — Progress Notes (Signed)
Patient ID: [name] [name], female   DOB: Mar 06, 1925, 79 y.o.   MRN: [id number] PCP:  [name] Coombs, MD - [facility]   Weight Range 138-140  Baseline proBNP     HPI: [name] is a very pleasant 79 y/o woman with h/o HTN, HL, DM2, mitral regurgitation (s/p MVR 2/14), bradycardia (s/p Medtronic dual chamber pacemaker) and PAF. 09/22/12  S/P for insertion of Medtronic dual chamber pacemaker.  Experienced PEA arrest at OSH in 2/14. She was transferred to [facility]. Found to have new onset AF with RVR with normal EF and severe MR due to annular dilation. She underwent heart cath which showed non-obstructive CAD. Unfortunately this was complicated by a large left groin bleed. She eventually underwent minimally Invasive Mitral Valve Repair utilizing a 26 mm Sorin Memo 3D ring for annuloplasty and a MAZE Procedure on 05/20/12. Was discharged on 05/27/2012.  Had AFL in 06/06/12 S/P TEE Successful DC-CV. But 10/10/12 at office visit she was in and out of A fib.   09/07/12 ECHO EF 60-65% Peak PA pressure 62  Admitted to [facility] 10/12/12 through 09/2512 with increased dyspnea. Had R thoracentesis. Discharge weight 136 pounds. L upper chest  ICD site hematoma stable.   Follow up: Last visit increased daily lasix dose to 80 mg daily and to hold if weight is <140 lbs. Weight at home 138-141 lbs. Weight is stable compared to prior appointment.  Very fatigued. DOE with minimal exertion. SBP 140-150/50s. She is noticing bilateral LE edema increasing with erythema. Denies orthopnea or CP.  Occasional palpitations.  Followed by [facility]. Following low salt diet and limiting fludi intake to < 2 liters per day. + palpitations.   ECG: a-paced, v-sensed  Labs (10/14): K+ 4.5, Cr 1.13  ROS: All systems negative except as listed in HPI, PMH and Problem List.  Past Medical History  Diagnosis Date  . Hypertension   . Hyperlipidemia   . Type 2 diabetes mellitus   . Arthritis   . Coronary artery disease     a. Mild  nonobstructive by cath 04/2012, cath complicated by groin hematoma.  . S/P mitral valve repair 05/20/2012    a. Severe MR - 04/2012: 26mm Sorin Memo 3D ring annuloplasty via right mini thoracotomy approach  . S/P Maze operation for atrial fibrillation 05/20/2012    a. 04/2012: Complete bi-atrial lesion set using cryothermy  . Atrial fibrillation     a. Diagnosed 04/2012 in the setting of severe MR. b. s/p TEE/successful DCCV 06/06/12.  Marland Kitchen PEA (Pulseless electrical activity)     a. 04/2012 -> adm with afib, developed PEA, transferred to [facility] for prolonged hospitalization.   . Chronic diastolic CHF (congestive heart failure)   . Symptomatic bradycardia     a. s/p Medtronic pacemaker 08/2012 (pocket hematoma)  . Moderate to severe pulmonary hypertension   . Pleural effusion     a. s/p thoracentesis 08/2012.  Marland Kitchen Hypoxemia     a. During admission 08/2012 for for UTI, hypoxia, FTT and pleural effusion s/p thoracentesis with transudative findings.  . Failure to thrive     a. Admission 08/2012.    Current Outpatient Prescriptions  Medication Sig Dispense Refill  . amiodarone (PACERONE) 200 MG tablet Take 1 tablet (200 mg total) by mouth 2 (two) times daily.  60 tablet  3  . citalopram (CELEXA) 20 MG tablet Take 20 mg by mouth daily.      . furosemide (LASIX) 40 MG tablet Take 2 tablets (80 mg total)  by mouth daily. Only take if wt 140 or greater  60 tablet  6  . insulin glargine (LANTUS) 100 UNIT/ML injection Inject 18 Units into the skin at bedtime. IF CBG ELEVATED      . losartan (COZAAR) 25 MG tablet Take 1 tablet (25 mg total) by mouth daily.  90 tablet  3  . magnesium oxide (MAG-OX) 400 MG tablet Take 1 tablet (400 mg total) by mouth 2 (two) times daily.  60 tablet  6  . rOPINIRole (REQUIP) 0.5 MG tablet Take 0.5 mg by mouth at bedtime.      . simvastatin (ZOCOR) 20 MG tablet Take 1 tablet (20 mg total) by mouth every evening.  30 tablet  0  . traMADol (ULTRAM) 50 MG tablet Take 50 mg by mouth at bedtime  as needed.       . traZODone (DESYREL) 50 MG tablet Take 50 mg by mouth at bedtime.      Marland Kitchen warfarin (COUMADIN) 3 MG tablet Take 3 mg by mouth daily.       No current facility-administered medications for this encounter.   Facility-Administered Medications Ordered in Other Encounters  Medication Dose Route Frequency Provider Last Rate Last Dose  . fentaNYL (SUBLIMAZE) injection    PRN Ellin Goodie, CRNA   50 mcg at 05/15/12 0700  . midazolam (VERSED) 5 MG/5ML injection    PRN Ellin Goodie, CRNA   0.5 mg at 05/15/12 0700   PHYSICAL EXAM: Filed Vitals:   01/28/13 1106  BP: 147/59  Pulse: 97  Weight: 144 lb (65.318 kg)  SpO2: 99%   Physical Exam:  General: Elderly appearing. No resp difficulty; sitting in the chair Daughter present HEENT: normal  Neck: supple. JVP 10-12. Carotids 2+ bilat; no bruits. No lymphadenopathy or thryomegaly appreciated.  Cor: PMI nondisplaced. Regular rate & rhythm. No rubs, gallops or murmurs.  Lungs: Clear  Abdomen: soft, nontender, nondistended. No hepatosplenomegaly. No bruits or masses. Good bowel sounds.  Extremities: no cyanosis, clubbing, rash, 2+ edema 3/4 to knees bilaterally Neuro: alert & orientedx3, cranial nerves grossly intact. moves all 4 extremities w/o difficulty. Affect pleasant    EKG: A paced 62 bpm  ASSESSMENT & PLAN:  1. Chronic Diastolic Heart Failure: 09/07/12 ECHO EF 60-65% Peak PA pressure 62.  NYHA III symptoms. Last visit increased lasix to 80 mg daily and remains volume overloaded with JVD and edema.  - Will increase lasix to 80 mg qam and 40 mg qpm. Will check BMET next week with HH to assess kidney fx and K+. - Asked to wear TED hose daily.  - Have encouraged the patient to try and be more active. She has a life alert and talked about wearing whenever family is not around. - Reinforced the need and importance of daily weights, a low sodium diet, and fluid restriction (less than 2 L a day). Instructed to call the HF clinic  if weight increases more than 3 lbs overnight or 5 lbs in a week.  2. A Fib: EKG today A paced 62 bpm. She has been on coumadin and cannot change to NOAC (valvular atrial fibrillation).  - Will decrease amiodarone to 200 mg daily.  - Occasional palpitations.  Will get remote PCM check to assess for atrial fibrillation.  - Check TSH and LFTs next week with [facility], needs yearly eye exam.  3. S/p mitral valve repair: Stable on last echo.    Follow up 2 weeks to reassess volume status.   Elsie Ra,  Carter Kitten NP-C 11:26 AM  Patient seen with NP, agree with the above note.  She is volume overloaded on exam.  Increase Lasix to 80 qam/40 qpm with BMET in 1 week.  Can decrease amiodarone to 200 mg daily.  Will get TSH/LFTs with next labs.    Marca Ancona 01/28/2013

## 2013-01-28 NOTE — Patient Instructions (Signed)
Increase lasix to 80 mg (2 tablets) in the morning and 40 mg (1 tablet) in the evening.  Follow up in 2 weeks.  Decrease Amiodarone to 200 mg daily.  Do the following things EVERYDAY: 1) Weigh yourself in the morning before breakfast. Write it down and keep it in a log. 2) Take your medicines as prescribed 3) Eat low salt foods-Limit salt (sodium) to 2000 mg per day.  4) Stay as active as you can everyday 5) Limit all fluids for the day to less than 2 liters 6)

## 2013-02-10 ENCOUNTER — Encounter (HOSPITAL_COMMUNITY): Payer: Medicare Other

## 2013-02-12 ENCOUNTER — Ambulatory Visit (HOSPITAL_COMMUNITY)
Admission: RE | Admit: 2013-02-12 | Discharge: 2013-02-12 | Disposition: A | Discharge status: Home / Self Care | Payer: Medicare Other | Source: Ambulatory Visit | Attending: Internal Medicine | Admitting: Internal Medicine

## 2013-02-12 ENCOUNTER — Telehealth (HOSPITAL_COMMUNITY): Payer: Self-pay | Admitting: Adult Health

## 2013-02-12 ENCOUNTER — Encounter (HOSPITAL_COMMUNITY): Payer: Self-pay

## 2013-02-12 VITALS — BP 116/58 | HR 92 | Wt 140.4 lb

## 2013-02-12 DIAGNOSIS — Z950 Presence of cardiac pacemaker: Secondary | ICD-10-CM | POA: Insufficient documentation

## 2013-02-12 DIAGNOSIS — E785 Hyperlipidemia, unspecified: Secondary | ICD-10-CM | POA: Insufficient documentation

## 2013-02-12 DIAGNOSIS — Z8679 Personal history of other diseases of the circulatory system: Secondary | ICD-10-CM

## 2013-02-12 DIAGNOSIS — I10 Essential (primary) hypertension: Secondary | ICD-10-CM | POA: Insufficient documentation

## 2013-02-12 DIAGNOSIS — Z9889 Other specified postprocedural states: Secondary | ICD-10-CM

## 2013-02-12 DIAGNOSIS — I5032 Chronic diastolic (congestive) heart failure: Secondary | ICD-10-CM

## 2013-02-12 DIAGNOSIS — I2510 Atherosclerotic heart disease of native coronary artery without angina pectoris: Secondary | ICD-10-CM | POA: Insufficient documentation

## 2013-02-12 DIAGNOSIS — I059 Rheumatic mitral valve disease, unspecified: Secondary | ICD-10-CM | POA: Insufficient documentation

## 2013-02-12 DIAGNOSIS — I4891 Unspecified atrial fibrillation: Secondary | ICD-10-CM

## 2013-02-12 LAB — BASIC METABOLIC PANEL
Calcium: 9.6 mg/dL (ref 8.4–10.5)
Creatinine, Ser: 1.34 mg/dL — ABNORMAL HIGH (ref 0.50–1.10)
GFR calc Af Amer: 40 mL/min — ABNORMAL LOW (ref >90)
GFR calc non Af Amer: 35 mL/min — ABNORMAL LOW (ref >90)
Sodium: 137 mEq/L (ref 135–145)

## 2013-02-12 MED ORDER — FUROSEMIDE 40 MG PO TABS: 80.0000 mg | ORAL_TABLET | Freq: Two times a day (BID) | ORAL | Status: DC

## 2013-02-12 NOTE — Progress Notes (Signed)
Patient ID: [name] [name], female   DOB: 1924-12-10, 79 y.o.   MRN: [id number] PCP:  Roe Coombs, MD - [facility]   Weight Range 138-140  Baseline proBNP     HPI: Ms. Ertle is a very pleasant 79 y/o woman with h/o HTN, HL, DM2, mitral regurgitation (s/p MVR 2/14), bradycardia (s/p Medtronic dual chamber pacemaker) and PAF. 09/22/12  S/P for insertion of Medtronic dual chamber pacemaker.  Experienced PEA arrest at OSH in 2/14. She was transferred to [facility]. Found to have new onset AF with RVR with normal EF and severe MR due to annular dilation. She underwent heart cath which showed non-obstructive CAD. Unfortunately this was complicated by a large left groin bleed. She eventually underwent minimally Invasive Mitral Valve Repair utilizing a 26 mm Sorin Memo 3D ring for annuloplasty and a MAZE Procedure on 05/20/12. Was discharged on 05/27/2012.  Had AFL in 06/06/12 S/P TEE Successful DC-CV. But 10/10/12 at office visit she was in and out of A fib.   09/07/12 ECHO EF 60-65% Peak PA pressure 62  Admitted to [facility] 10/12/12 through 09/2512 with increased dyspnea. Had R thoracentesis. Discharge weight 136 pounds. L upper chest  ICD site hematoma stable.   She returns for follow up. Recently discharged from SNF. Last visit lasix was increaed to 80 mg in am and 40 mg in pm. Also amiodarone was cut back to 200 mg daily. Doing much, much better. Getting more independent. Less dyspnea. No orthopnea, PND or edema. Weight at home down to 137 pounds.  Followed by [facility]. Following low salt diet and limiting fludi intake to < 2 liters per day.   ECG: a-paced, v-sensed  Labs (10/14): K+ 4.5, Cr 1.13  ROS: All systems negative except as listed in HPI, PMH and Problem List.  Past Medical History  Diagnosis Date  . Hypertension   . Hyperlipidemia   . Type 2 diabetes mellitus   . Arthritis   . Coronary artery disease     a. Mild nonobstructive by cath 04/2012, cath complicated by groin hematoma.  . S/P  mitral valve repair 05/20/2012    a. Severe MR - 04/2012: 26mm Sorin Memo 3D ring annuloplasty via right mini thoracotomy approach  . S/P Maze operation for atrial fibrillation 05/20/2012    a. 04/2012: Complete bi-atrial lesion set using cryothermy  . Atrial fibrillation     a. Diagnosed 04/2012 in the setting of severe MR. b. s/p TEE/successful DCCV 06/06/12.  Marland Kitchen PEA (Pulseless electrical activity)     a. 04/2012 -> adm with afib, developed PEA, transferred to [facility] for prolonged hospitalization.   . Chronic diastolic CHF (congestive heart failure)   . Symptomatic bradycardia     a. s/p Medtronic pacemaker 08/2012 (pocket hematoma)  . Moderate to severe pulmonary hypertension   . Pleural effusion     a. s/p thoracentesis 08/2012.  Marland Kitchen Hypoxemia     a. During admission 08/2012 for for UTI, hypoxia, FTT and pleural effusion s/p thoracentesis with transudative findings.  . Failure to thrive     a. Admission 08/2012.    Current Outpatient Prescriptions  Medication Sig Dispense Refill  . amiodarone (PACERONE) 200 MG tablet Take 1 tablet (200 mg total) by mouth daily.  30 tablet  3  . citalopram (CELEXA) 20 MG tablet Take 20 mg by mouth daily.      . furosemide (LASIX) 40 MG tablet Take 80 mg (2 tablets) in the morning and 40 mg (1 tablet) in  the evening.  90 tablet  6  . insulin glargine (LANTUS) 100 UNIT/ML injection Inject 18 Units into the skin at bedtime. IF CBG ELEVATED      . losartan (COZAAR) 25 MG tablet Take 1 tablet (25 mg total) by mouth daily.  90 tablet  3  . magnesium oxide (MAG-OX) 400 MG tablet Take 1 tablet (400 mg total) by mouth 2 (two) times daily.  60 tablet  6  . rOPINIRole (REQUIP) 0.5 MG tablet Take 0.5 mg by mouth at bedtime.      . simvastatin (ZOCOR) 20 MG tablet Take 1 tablet (20 mg total) by mouth every evening.  30 tablet  0  . traMADol (ULTRAM) 50 MG tablet Take 50 mg by mouth at bedtime as needed.       . traZODone (DESYREL) 50 MG tablet Take 50 mg by mouth at bedtime.       Marland Kitchen warfarin (COUMADIN) 3 MG tablet Take 3 mg by mouth daily.       No current facility-administered medications for this encounter.   Facility-Administered Medications Ordered in Other Encounters  Medication Dose Route Frequency Provider Last Rate Last Dose  . fentaNYL (SUBLIMAZE) injection    PRN Ellin Goodie, CRNA   50 mcg at 05/15/12 0700  . midazolam (VERSED) 5 MG/5ML injection    PRN Ellin Goodie, CRNA   0.5 mg at 05/15/12 0700   PHYSICAL EXAM: Filed Vitals:   02/12/13 1144  BP: 116/58  Pulse: 92  Weight: 140 lb 6.4 oz (63.685 kg)  SpO2: 99%   Physical Exam:  General: Elderly appearing. No resp difficulty;  Ambulates clinic independently. Daughter present HEENT: normal  Neck: supple. JVP 8-9. Carotids 2+ bilat; no bruits. No lymphadenopathy or thryomegaly appreciated.  Cor: PMI nondisplaced. Regular rate & rhythm. No rubs, gallops or murmurs.  Lungs: clear.  Abdomen: soft, nontender, nondistended. No hepatosplenomegaly. No bruits or masses. Good bowel sounds.  Extremities: no cyanosis, clubbing, rash, 1+ edema Neuro: alert & orientedx3, cranial nerves grossly intact. moves all 4 extremities w/o difficulty. Affect pleasant    ASSESSMENT & PLAN:  1. Chronic Diastolic Heart Failure: 09/07/12 ECHO EF 60-65% Peak PA pressure 62.  NYHA III symptoms. Volume status mildly elevated.   - Increase lasix to 80 mg twice a day.  - Reinforced the need and importance of daily weights, a low sodium diet, and fluid restriction (less than 2 L a day). Instructed to call the HF clinic if weight increases more than 3 lbs overnight or 5 lbs in a week.  Check BMET today 2. A Fib: EKG today A paced 62 bpm. She has been on coumadin and cannot change to NOAC (valvular atrial fibrillation).  - Continue amiodarone to 200 mg daily.  -Needs yearly eye exam.  3. S/p mitral valve repair: Stable on last echo.    Follow up in 2 months  CLEGG,AMY NP-C 11:51 AM  Patient seen and examined with Tonye Becket, NP. We discussed all aspects of the encounter. I agree with the assessment and plan as stated above. She is much improved. NYHA II. Volume status mildly elevated. Will increase lasix slightly. Reinforced need for daily weights and reviewed use of sliding scale diuretics. Continue coumadin (no NOAC due to valvular AF). Maintaining SR on low dose amio. Check labs.   Daniel Bensimhon,MD 12:16 AM

## 2013-02-12 NOTE — Telephone Encounter (Signed)
Left message.   Lab work stable.    No change.   [name] [name] 2:11 PM

## 2013-02-12 NOTE — Patient Instructions (Signed)
Follow up in 2 months  Take lasix 80 mg twice a day  Do the following things EVERYDAY: 1) Weigh yourself in the morning before breakfast. Write it down and keep it in a log. 2) Take your medicines as prescribed 3) Eat low salt foods-Limit salt (sodium) to 2000 mg per day.  4) Stay as active as you can everyday 5) Limit all fluids for the day to less than 2 liters

## 2013-02-27 ENCOUNTER — Other Ambulatory Visit (HOSPITAL_COMMUNITY): Payer: Self-pay | Admitting: Anesthesiology

## 2013-05-18 ENCOUNTER — Encounter (HOSPITAL_COMMUNITY): Payer: Medicare Other

## 2013-06-05 ENCOUNTER — Ambulatory Visit (HOSPITAL_COMMUNITY)
Admission: RE | Admit: 2013-06-05 | Discharge: 2013-06-05 | Disposition: A | Discharge status: Home / Self Care | Payer: Medicare Other | Source: Ambulatory Visit | Attending: Internal Medicine | Admitting: Internal Medicine

## 2013-06-05 VITALS — BP 112/46 | HR 88 | Wt 144.5 lb

## 2013-06-05 DIAGNOSIS — Z9889 Other specified postprocedural states: Secondary | ICD-10-CM

## 2013-06-05 DIAGNOSIS — I4891 Unspecified atrial fibrillation: Secondary | ICD-10-CM | POA: Insufficient documentation

## 2013-06-05 DIAGNOSIS — I5032 Chronic diastolic (congestive) heart failure: Secondary | ICD-10-CM | POA: Insufficient documentation

## 2013-06-05 DIAGNOSIS — R001 Bradycardia, unspecified: Secondary | ICD-10-CM

## 2013-06-05 DIAGNOSIS — I498 Other specified cardiac arrhythmias: Secondary | ICD-10-CM

## 2013-06-05 LAB — COMPREHENSIVE METABOLIC PANEL
ALK PHOS: 116 U/L (ref 39–117)
ALT: 27 U/L (ref 0–35)
AST: 29 U/L (ref 0–37)
Albumin: 3.6 g/dL (ref 3.5–5.2)
BILIRUBIN TOTAL: 0.7 mg/dL (ref 0.3–1.2)
BUN: 53 mg/dL — AB (ref 6–23)
CHLORIDE: 98 meq/L (ref 96–112)
CO2: 28 mEq/L (ref 19–32)
Calcium: 9.5 mg/dL (ref 8.4–10.5)
Creatinine, Ser: 1.35 mg/dL — ABNORMAL HIGH (ref 0.50–1.10)
GFR calc non Af Amer: 34 mL/min — ABNORMAL LOW (ref >90)
GFR, EST AFRICAN AMERICAN: 40 mL/min — AB (ref >90)
GLUCOSE: 131 mg/dL — AB (ref 70–99)
POTASSIUM: 3.9 meq/L (ref 3.7–5.3)
Sodium: 140 mEq/L (ref 137–147)
Total Protein: 7.2 g/dL (ref 6.0–8.3)

## 2013-06-05 LAB — TSH: TSH: 4.54 u[IU]/mL — ABNORMAL HIGH (ref 0.350–4.500)

## 2013-06-05 LAB — MAGNESIUM: MAGNESIUM: 2.2 mg/dL (ref 1.5–2.5)

## 2013-06-05 MED ORDER — POTASSIUM CHLORIDE CRYS ER 20 MEQ PO TBCR: 20.0000 meq | EXTENDED_RELEASE_TABLET | Freq: Every day | ORAL | Status: DC

## 2013-06-05 MED ORDER — FUROSEMIDE 80 MG PO TABS: 80.0000 mg | ORAL_TABLET | Freq: Two times a day (BID) | ORAL | Status: DC

## 2013-06-05 NOTE — Patient Instructions (Signed)
Increase Lasix (Furosemide) to 80 mg Twice daily   Start K-dur (potassium) 20 meq daily  Stop Losartan  Check BP at home and bring recordings to next appointment  Your physician recommends that you schedule a follow-up appointment in: 2 weeks

## 2013-06-06 NOTE — Addendum Note (Signed)
Encounter addended by: Ernestina PennaAlysia M Roberts, CCT on: 06/06/2013 11:54 AM<BR>     Documentation filed: Charges VN

## 2013-06-06 NOTE — Progress Notes (Signed)
Patient ID: [name] [name], female   DOB: 1924-06-06, 80 y.o.   MRN: [id number] PCP:  Roe Coombs, MD - [facility]   Weight Range 138-140  Baseline proBNP     HPI: [name] [name] is a very pleasant 80 y/o woman with h/o HTN, HL, DM2, mitral regurgitation (s/p MV repair 2/14), bradycardia (s/p Medtronic dual [name] pacemaker) and PAF.   Experienced PEA arrest at OSH in 2/14. She was transferred to [facility]. Found to have new onset AF with RVR with normal EF and severe MR due to annular dilation. She underwent heart cath which showed non-obstructive CAD. Unfortunately this was complicated by a large left groin bleed. She eventually underwent minimally Invasive Mitral Valve Repair utilizing a 26 mm Sorin Memo 3D ring for annuloplasty and a MAZE Procedure on 05/20/12. Was discharged on 05/27/2012.  Had atrial flutter in 06/06/12 S/P TEE Successful DC-CV. But 10/10/12 at office visit she was in and out of A fib.   09/07/12 ECHO EF 60-65%, s/p MV repair with mild to moderate MR and mild to possibly moderate mitral stenosis, moderate TR, Peak PA pressure 62  Admitted to [facility] 10/12/12 through 10/17/12 with increased dyspnea. Had R thoracentesis. Discharge weight 136 pounds. L upper chest  ICD site hematoma stable.   She returns for follow up. She is in NSR (versus ectopic atrial rhythm) today.  She is here with her daughter.  She lives by herself but family is close by.  Since last visit, she decreased Lasix back to 80 qam/40 qpm because she was getting lightheaded with standing after going on the higher dose.  She has gained about 4 lbs.  She is not short of breath walking in the house but is dyspneic walking 100 yards.  Mildly dyspneic walking up stairs.  No orthopnea or bendopnea.  No chest pain.   ECG: Sinus rhythm with small P's versus ectopic atrial rhythm with 1st degree AV block and inferior/anterolateral Qs.   Labs (10/14): K 4.5, Cr 1.13 Labs (11/14): K 3.8, creatinine 1.34  ROS: All systems negative except  as listed in HPI, PMH and Problem List.  Past Medical History  Diagnosis Date  . Hypertension   . Hyperlipidemia   . Type 2 diabetes mellitus   . Arthritis   . Coronary artery disease     a. Mild nonobstructive by cath 04/2012, cath complicated by groin hematoma.  . S/P mitral valve repair 05/20/2012    a. Severe MR - 04/2012: 26mm Sorin Memo 3D ring annuloplasty via right mini thoracotomy approach  . S/P Maze operation for atrial fibrillation 05/20/2012    a. 04/2012: Complete bi-atrial lesion set using cryothermy  . Atrial fibrillation     a. Diagnosed 04/2012 in the setting of severe MR. b. s/p TEE/successful DCCV 06/06/12.  Marland Kitchen PEA (Pulseless electrical activity)     a. 04/2012 -> adm with afib, developed PEA, transferred to [facility] for prolonged hospitalization.   . Chronic diastolic CHF (congestive heart failure)   . Symptomatic bradycardia     a. s/p Medtronic pacemaker 08/2012 (pocket hematoma)  . Moderate to severe pulmonary hypertension   . Pleural effusion     a. s/p thoracentesis 08/2012.  Marland Kitchen Hypoxemia     a. During admission 08/2012 for for UTI, hypoxia, FTT and pleural effusion s/p thoracentesis with transudative findings.  . Failure to thrive     a. Admission 08/2012.    Current Outpatient Prescriptions  Medication Sig Dispense Refill  . amiodarone (PACERONE) 200  MG tablet Take 1 tablet (200 mg total) by mouth daily.  30 tablet  3  . citalopram (CELEXA) 20 MG tablet Take 20 mg by mouth daily.      . furosemide (LASIX) 80 MG tablet Take 1 tablet (80 mg total) by mouth 2 (two) times daily.  60 tablet  6  . insulin glargine (LANTUS) 100 UNIT/ML injection Inject 10 Units into the skin at bedtime. IF CBG ELEVATED      . magnesium oxide (MAG-OX) 400 MG tablet Take 400 mg by mouth daily.      Marland Kitchen. rOPINIRole (REQUIP) 0.5 MG tablet Take 0.5 mg by mouth at bedtime.      . simvastatin (ZOCOR) 20 MG tablet Take 1 tablet (20 mg total) by mouth every evening.  30 tablet  0  . traMADol (ULTRAM) 50  MG tablet Take 50 mg by mouth at bedtime as needed.       . traZODone (DESYREL) 50 MG tablet Take 50 mg by mouth at bedtime.      Marland Kitchen. warfarin (COUMADIN) 3 MG tablet Take 3 mg by mouth daily.      . potassium chloride SA (K-DUR,KLOR-CON) 20 MEQ tablet Take 1 tablet (20 mEq total) by mouth daily.  30 tablet  3   No current facility-administered medications for this encounter.   Facility-Administered Medications Ordered in Other Encounters  Medication Dose Route Frequency Provider Last Rate Last Dose  . fentaNYL (SUBLIMAZE) injection    PRN Ellin GoodieHolly M Weaver, CRNA   50 mcg at 05/15/12 0700  . midazolam (VERSED) 5 MG/5ML injection    PRN Ellin GoodieHolly M Weaver, CRNA   0.5 mg at 05/15/12 0700   PHYSICAL EXAM: Filed Vitals:   06/05/13 1043  BP: 112/46  Pulse: 88  Weight: 144 lb 8 oz (65.545 kg)  SpO2: 99%   Physical Exam:  General: Elderly. No resp difficulty;  Ambulates clinic independently. Daughter present HEENT: normal  Neck: supple. JVP 12. Carotids 2+ bilat; no bruits. No lymphadenopathy or thryomegaly appreciated.  Cor: PMI nondisplaced. Regular rate & rhythm. No rubs, gallops.  2/6 HSM apex.   Lungs: clear.  Abdomen: soft, nontender, nondistended. No hepatosplenomegaly. No bruits or masses. Good bowel sounds.  Extremities: no cyanosis, clubbing, rash, 1+ edema to knees bilaterally.  Neuro: alert & orientedx3, cranial nerves grossly intact. moves all 4 extremities w/o difficulty. Affect pleasant   ASSESSMENT & PLAN:  1. Chronic Diastolic Heart Failure: 09/07/12 ECHO EF 60-65% Peak PA pressure 62.  NYHA III symptoms. She is volume overloaded on exam.   - Increase lasix back to 80 mg twice a day and add KCl 20 mEq daily.  She was lightheaded after increasing to this dose in the past, so I will also have her stop losartan.  - Reinforced the need and importance of daily weights, a low sodium diet, and fluid restriction (less than 2 L a day). Instructed to call the HF clinic if weight increases more  than 3 lbs overnight or 5 lbs in a week.  - Check BMET today and will have her return to clinic in 2 wks with BMET.  2. A Fib: Currently not in atrial fibrillation (NSR versus ectopic atrial rhythm).  No recent tachypalpitations. She has been on coumadin and cannot change to NOAC (valvular atrial fibrillation).  - Continue amiodarone to 200 mg daily.  Will check LFTs and TSH today.  She will need yearly eye exams while on amiodarone.  3. S/p mitral valve repair: Last echo  showed mild to moderate MR and mild to possibly moderate mitral stenosis.  She has an MR-type murmur on exam.  Would probably repeat echo in 6/15 to follow mitral valve pathology.   4. H/o bradycardia: Has Medtronic PPM, gets regular followup.   Followup in 2 wks.   Marca Ancona  06/06/2013

## 2013-06-08 ENCOUNTER — Encounter: Payer: Self-pay | Admitting: Thoracic Surgery (Cardiothoracic Vascular Surgery)

## 2013-06-08 ENCOUNTER — Ambulatory Visit (INDEPENDENT_AMBULATORY_CARE_PROVIDER_SITE_OTHER): Payer: Medicare Other | Admitting: Thoracic Surgery (Cardiothoracic Vascular Surgery)

## 2013-06-08 VITALS — BP 122/72 | HR 104 | Resp 20 | Ht 64.0 in | Wt 144.0 lb

## 2013-06-08 DIAGNOSIS — Z9889 Other specified postprocedural states: Secondary | ICD-10-CM

## 2013-06-08 DIAGNOSIS — I059 Rheumatic mitral valve disease, unspecified: Secondary | ICD-10-CM

## 2013-06-08 DIAGNOSIS — Z8679 Personal history of other diseases of the circulatory system: Secondary | ICD-10-CM

## 2013-06-08 DIAGNOSIS — I340 Nonrheumatic mitral (valve) insufficiency: Secondary | ICD-10-CM

## 2013-06-08 NOTE — Progress Notes (Signed)
[address]       [name] [name] [id number]             [id number]     CARDIOTHORACIC SURGERY OFFICE NOTE  Referring Provider is Bensimhon, Bevelyn Buckles, MD PCP is Marylen Ponto, MD   HPI:  Patient returns for routine followup and rhythm check status post minimally invasive mitral valve repair and Maze procedure on 05/20/2012. She was last seen here in our office on 01/05/2013. She continues to followup closely in the advanced heart failure clinic where she was seen this past Friday by Dr. Shirlee Latch.  At that point she was notably in sinus rhythm. Her dose of Lasix was increased to 80 mg twice daily and her losartan was discontinued. She reports stable symptoms of exertional shortness of breath. This does not seem to limit her physical activities on a daily basis although she admits that she is not very active physically.  She has not had any tachypalpitations. She's had some dizzy spells in the past, although none recently.    Current Outpatient Prescriptions  Medication Sig Dispense Refill  . amiodarone (PACERONE) 200 MG tablet Take 1 tablet (200 mg total) by mouth daily.  30 tablet  3  . citalopram (CELEXA) 20 MG tablet Take 20 mg by mouth daily.      . furosemide (LASIX) 80 MG tablet Take 1 tablet (80 mg total) by mouth 2 (two) times daily.  60 tablet  6  . insulin glargine (LANTUS) 100 UNIT/ML injection Inject 10 Units into the skin at bedtime. IF CBG ELEVATED      . magnesium oxide (MAG-OX) 400 MG tablet Take 400 mg by mouth daily.      . potassium chloride SA (K-DUR,KLOR-CON) 20 MEQ tablet Take 1 tablet (20 mEq total) by mouth daily.  30 tablet  3  . rOPINIRole (REQUIP) 0.5 MG tablet Take 1 mg by mouth at bedtime.       . simvastatin (ZOCOR) 20 MG tablet Take 1 tablet (20 mg total) by mouth every evening.  30 tablet  0  . traMADol (ULTRAM) 50 MG tablet Take 50 mg by mouth at bedtime as needed.       . traZODone (DESYREL) 50 MG tablet Take 50 mg by mouth at bedtime.       Marland Kitchen warfarin (COUMADIN) 3 MG tablet Take 3 mg by mouth daily.       No current facility-administered medications for this visit.   Facility-Administered Medications Ordered in Other Visits  Medication Dose Route Frequency Provider Last Rate Last Dose  . fentaNYL (SUBLIMAZE) injection    PRN Ellin Goodie, CRNA   50 mcg at 05/15/12 0700  . midazolam (VERSED) 5 MG/5ML injection    PRN Ellin Goodie, CRNA   0.5 mg at 05/15/12 0700      Physical Exam:   BP 122/72  Pulse 104  Resp 20  Ht 5\' 4"  (1.626 m)  Wt 144 lb (65.318 kg)  BMI 24.71 kg/m2  SpO2 99%  General:  Frail appearing  Chest:   Clear to auscultation  CV:   Irregular rate and rhythm with no murmur  Incisions:  n/a  Abdomen:  Soft and nontender  Extremities:  Warm and well-perfused with moderate to lateral lower extremity edema  Diagnostic Tests:  2 channel telemetry rhythm strip demonstrates what appears to be atrial fibrillation with ventricular pacing   Impression:  Patient is clinically stable 1 year status post minimally  invasive mitral valve repair and Maze procedure. She continues to go in and out of rate control atrial fibrillation. She remains on amiodarone and coumadin.  She reports stable symptoms of chronic combined systolic and diastolic congestive heart failure, New York Heart Association functional class II to class III. She notes that she is doing much better than she was prior to her surgery in February of 2014.  Plan:  The patient will continue to follow closely in the advanced heart failure clinic.  We will plan to see her back in one years time to see how she is getting along.  We have not made any changes in her medical therapy.   Salvatore Decentlarence H. Cornelius Moraswen, MD 06/08/2013 1:31 PM

## 2013-06-15 ENCOUNTER — Telehealth (HOSPITAL_COMMUNITY): Payer: Self-pay | Admitting: Cardiology

## 2013-06-15 NOTE — Telephone Encounter (Signed)
Pt called to request lab results from Dr Leonor LivHolt [place] Results were to be faxed to our office for re check

## 2013-06-17 ENCOUNTER — Other Ambulatory Visit (HOSPITAL_COMMUNITY): Payer: Self-pay

## 2013-06-17 MED ORDER — AMIODARONE HCL 200 MG PO TABS: 200.0000 mg | ORAL_TABLET | Freq: Every day | ORAL | Status: DC

## 2013-06-18 ENCOUNTER — Ambulatory Visit (HOSPITAL_COMMUNITY)
Admission: RE | Admit: 2013-06-18 | Discharge: 2013-06-18 | Disposition: A | Discharge status: Home / Self Care | Payer: Medicare Other | Source: Ambulatory Visit | Attending: Internal Medicine | Admitting: Internal Medicine

## 2013-06-18 VITALS — BP 148/64 | HR 105 | Wt 143.5 lb

## 2013-06-18 DIAGNOSIS — Z7901 Long term (current) use of anticoagulants: Secondary | ICD-10-CM | POA: Insufficient documentation

## 2013-06-18 DIAGNOSIS — I340 Nonrheumatic mitral (valve) insufficiency: Secondary | ICD-10-CM

## 2013-06-18 DIAGNOSIS — I5032 Chronic diastolic (congestive) heart failure: Secondary | ICD-10-CM | POA: Insufficient documentation

## 2013-06-18 DIAGNOSIS — I059 Rheumatic mitral valve disease, unspecified: Secondary | ICD-10-CM | POA: Insufficient documentation

## 2013-06-18 DIAGNOSIS — I4891 Unspecified atrial fibrillation: Secondary | ICD-10-CM | POA: Insufficient documentation

## 2013-06-18 MED ORDER — TORSEMIDE 20 MG PO TABS: 40.0000 mg | ORAL_TABLET | Freq: Two times a day (BID) | ORAL | Status: AC

## 2013-06-18 MED ORDER — POTASSIUM CHLORIDE CRYS ER 20 MEQ PO TBCR: 40.0000 meq | EXTENDED_RELEASE_TABLET | Freq: Every day | ORAL | Status: AC

## 2013-06-18 NOTE — Patient Instructions (Signed)
Stop lasix.  Take 2.5 mg metolazone today with extra 40 meq of potassium (2 tablets).  Start torsemide 40 mg (2 tablets) in the morning an 40 mg (2 tablets) in the afternoon.  Increase your potassium to 40 meq daily (2 tablets)  F/U 2 weeks  Do the following things EVERYDAY: 1) Weigh yourself in the morning before breakfast. Write it down and keep it in a log. 2) Take your medicines as prescribed 3) Eat low salt foods-Limit salt (sodium) to 2000 mg per day.  4) Stay as active as you can everyday 5) Limit all fluids for the day to less than 2 liters 6)

## 2013-06-18 NOTE — Progress Notes (Signed)
Patient ID: CATELYNN SPARGER, female   DOB: 1924/10/02, 80 y.o.   MRN: [id number] PCP:  Roe Coombs, MD - [facility]   Weight Range 138-140  Baseline proBNP     HPI: Ms. Smalling is a very pleasant 80 y/o woman with h/o HTN, HL, DM2, mitral regurgitation (s/p MV repair 2/14), bradycardia (s/p Medtronic dual chamber pacemaker) and PAF.   Experienced PEA arrest at OSH in 2/14. She was transferred to [facility]. Found to have new onset AF with RVR with normal EF and severe MR due to annular dilation. She underwent heart cath which showed non-obstructive CAD. Unfortunately this was complicated by a large left groin bleed. She eventually underwent minimally Invasive Mitral Valve Repair utilizing a 26 mm Sorin Memo 3D ring for annuloplasty and a MAZE Procedure on 05/20/12. Was discharged on 05/27/2012.  Had atrial flutter in 06/06/12 S/P TEE Successful DC-CV. But 10/10/12 at office visit she was in and out of A fib.   09/07/12 ECHO EF 60-65%, s/p MV repair with mild to moderate MR and mild to possibly moderate mitral stenosis, moderate TR, Peak PA pressure 62  Admitted to [facility] 10/12/12 through 10/17/12 with increased dyspnea. Had R thoracentesis. Discharge weight 136 pounds. L upper chest  ICD site hematoma stable.   Follow up: Last visit increased lasix to 80 mg BID and stopped losartan. Here with her daughter. Reports light headedness has improved. Denies SOB, CP, orthopnea or PND. + LE edema. DOE with minimal exertion walking from the garage to clinic. SBP at home 130-150s/ 60-70s and pulse 103-104. Following a low salt diet and drinking less than 2L a day. Weight at home 141-142 lbs.   Labs (10/14): K 4.5, Cr 1.13 Labs (11/14): K 3.8, creatinine 1.34  ROS: All systems negative except as listed in HPI, PMH and Problem List.  Past Medical History  Diagnosis Date  . Hypertension   . Hyperlipidemia   . Type 2 diabetes mellitus   . Arthritis   . Coronary artery disease     a. Mild nonobstructive by cath 04/2012,  cath complicated by groin hematoma.  . S/P mitral valve repair 05/20/2012    a. Severe MR - 04/2012: 26mm Sorin Memo 3D ring annuloplasty via right mini thoracotomy approach  . S/P Maze operation for atrial fibrillation 05/20/2012    a. 04/2012: Complete bi-atrial lesion set using cryothermy  . Atrial fibrillation     a. Diagnosed 04/2012 in the setting of severe MR. b. s/p TEE/successful DCCV 06/06/12.  Marland Kitchen PEA (Pulseless electrical activity)     a. 04/2012 -> adm with afib, developed PEA, transferred to [facility] for prolonged hospitalization.   . Chronic diastolic CHF (congestive heart failure)   . Symptomatic bradycardia     a. s/p Medtronic pacemaker 08/2012 (pocket hematoma)  . Moderate to severe pulmonary hypertension   . Pleural effusion     a. s/p thoracentesis 08/2012.  Marland Kitchen Hypoxemia     a. During admission 08/2012 for for UTI, hypoxia, FTT and pleural effusion s/p thoracentesis with transudative findings.  . Failure to thrive     a. Admission 08/2012.    Current Outpatient Prescriptions  Medication Sig Dispense Refill  . amiodarone (PACERONE) 200 MG tablet Take 1 tablet (200 mg total) by mouth daily.  30 tablet  3  . citalopram (CELEXA) 20 MG tablet Take 20 mg by mouth daily.      . furosemide (LASIX) 80 MG tablet Take 1 tablet (80 mg total) by mouth 2 (  two) times daily.  60 tablet  6  . insulin glargine (LANTUS) 100 UNIT/ML injection Inject 10 Units into the skin at bedtime. IF CBG ELEVATED      . magnesium oxide (MAG-OX) 400 MG tablet Take 400 mg by mouth daily.      . potassium chloride SA (K-DUR,KLOR-CON) 20 MEQ tablet Take 1 tablet (20 mEq total) by mouth daily.  30 tablet  3  . rOPINIRole (REQUIP) 0.5 MG tablet Take 1 mg by mouth at bedtime.       . simvastatin (ZOCOR) 20 MG tablet Take 1 tablet (20 mg total) by mouth every evening.  30 tablet  0  . traMADol (ULTRAM) 50 MG tablet Take 50 mg by mouth at bedtime as needed.       . traZODone (DESYREL) 50 MG tablet Take 50 mg by mouth at  bedtime.      Marland Kitchen. warfarin (COUMADIN) 3 MG tablet Take 3 mg by mouth daily.       No current facility-administered medications for this encounter.   Facility-Administered Medications Ordered in Other Encounters  Medication Dose Route Frequency Provider Last Rate Last Dose  . fentaNYL (SUBLIMAZE) injection    PRN Ellin GoodieHolly M Weaver, CRNA   50 mcg at 05/15/12 0700  . midazolam (VERSED) 5 MG/5ML injection    PRN Ellin GoodieHolly M Weaver, CRNA   0.5 mg at 05/15/12 0700    Filed Vitals:   06/18/13 1400  BP: 148/64  Pulse: 105  Weight: 143 lb 8 oz (65.091 kg)  SpO2: 93%   Physical Exam:  General: Elderly. No resp difficulty;  Ambulates clinic independently. Daughter present HEENT: normal  Neck: supple. JVP 12. Carotids 2+ bilat; no bruits. No lymphadenopathy or thryomegaly appreciated.  Cor: PMI nondisplaced. Regular rate & rhythm. No rubs, gallops.  2/6 HSM apex.   Lungs: clear.  Abdomen: soft, nontender, nondistended. No hepatosplenomegaly. No bruits or masses. Good bowel sounds.  Extremities: no cyanosis, clubbing, rash, 2+ edema to knees bilaterally.  Neuro: alert & orientedx3, cranial nerves grossly intact. moves all 4 extremities w/o difficulty. Affect pleasant   ASSESSMENT & PLAN:  1. Chronic Diastolic Heart Failure: EF 60-65% Peak PA pressure 62 (02/2013) - NYHA II/III symptoms and she remains volume overloaded on exam. Will give 2.5 mg metolazone today and change torsemide to 40 mg BID. Increase potassium to 40 meq daily. - Pending BMET from PCP - Reinforced the need and importance of daily weights, a low sodium diet, and fluid restriction (less than 2 L a day). Instructed to call the HF clinic if weight increases more than 3 lbs overnight or 5 lbs in a week.  2. A Fib: She appears to be in NSR today. No recent tachypalpitations. She has been on coumadin and cannot change to NOAC (valvular atrial fibrillation).  - Continue amiodarone to 200 mg daily.  Pending LFTs and TSH from PCP.Marland Kitchen.  She will  need yearly eye exams while on amiodarone.  3. S/p mitral valve repair: Last echo showed mild to moderate MR and mild to possibly moderate mitral stenosis.  She has an MR-type murmur on exam.  Would probably repeat echo in 6/15 to follow mitral valve pathology.   4. H/o bradycardia: Has Medtronic PPM, gets regular followup.   Followup in 2 wks.   Aundria RudCosgrove, Benjimin Hadden B  06/18/2013

## 2013-06-19 ENCOUNTER — Telehealth (HOSPITAL_COMMUNITY): Payer: Self-pay | Admitting: Anesthesiology

## 2013-06-19 NOTE — Telephone Encounter (Signed)
Labs received:  K+ 3.9, Creatinine 1.58, BUN 41 TSH 4.599, free T3 1.7, free T4 1.49  Creatinine elevated, however volume status remains elevated and diuretics were increased. F/U in 2 weeks with BMET.

## 2013-06-22 ENCOUNTER — Telehealth (HOSPITAL_COMMUNITY): Payer: Self-pay | Admitting: Cardiology

## 2013-06-22 NOTE — Telephone Encounter (Signed)
Daughter called with c/o extreme dizziness and rapid weight loss Current weight 136.8lb Pt was having severe dizziness spells over the weekend Increased fatigue  Pts daughter felt this was related to the new med torsemide, so Sunday dose was cut back to 20 mg BID Advised daughter symptoms sound more related to the dose of metolazone,not so much torsemide However will send message back for further review as it could be both Daughter will hold dose of torsemide until instructed otherwise.

## 2013-06-22 NOTE — Telephone Encounter (Signed)
Spoke w/pt's daughter pt took the metolazone last week on Thur and has really lost a lot of wt, down to 136 today, pt felt very weak and tired this AM, she did not take her BP she felt so bad, is feeling better now, only took 10 mg of Torsemide BID today, pt will call us in AM after she weighs and checks BP

## 2013-06-23 NOTE — Telephone Encounter (Signed)
Pts daughter called with bp reading and weight for today 137lbs 114/78

## 2013-06-23 NOTE — Telephone Encounter (Signed)
Per Ulla PotashAli Cosgrove, NP continue current dose of Torsemide, left mess for Velna HatchetSheila to continue and to call back

## 2013-06-25 NOTE — Telephone Encounter (Signed)
Spoke w/pt's daughter pt has been on her Torsemide and feeling ok wt today 138 BP also running ok will keep f/u sch for Wed

## 2013-06-25 NOTE — Telephone Encounter (Signed)
Left message to call back  

## 2013-07-01 ENCOUNTER — Encounter (HOSPITAL_COMMUNITY): Payer: Self-pay

## 2013-07-01 ENCOUNTER — Ambulatory Visit (HOSPITAL_COMMUNITY)
Admission: RE | Admit: 2013-07-01 | Discharge: 2013-07-01 | Disposition: A | Discharge status: Home / Self Care | Payer: Medicare Other | Source: Ambulatory Visit | Attending: Anesthesiology | Admitting: Anesthesiology

## 2013-07-01 ENCOUNTER — Ambulatory Visit (HOSPITAL_COMMUNITY)
Admission: RE | Admit: 2013-07-01 | Discharge: 2013-07-01 | Disposition: A | Discharge status: Home / Self Care | Payer: Medicare Other | Source: Ambulatory Visit | Attending: Internal Medicine | Admitting: Internal Medicine

## 2013-07-01 VITALS — BP 123/73 | HR 113 | Resp 20 | Wt 144.4 lb

## 2013-07-01 DIAGNOSIS — I5032 Chronic diastolic (congestive) heart failure: Secondary | ICD-10-CM

## 2013-07-01 DIAGNOSIS — J90 Pleural effusion, not elsewhere classified: Secondary | ICD-10-CM

## 2013-07-01 DIAGNOSIS — I5033 Acute on chronic diastolic (congestive) heart failure: Secondary | ICD-10-CM | POA: Insufficient documentation

## 2013-07-01 DIAGNOSIS — I4891 Unspecified atrial fibrillation: Secondary | ICD-10-CM

## 2013-07-01 DIAGNOSIS — I5031 Acute diastolic (congestive) heart failure: Secondary | ICD-10-CM

## 2013-07-01 DIAGNOSIS — Z7901 Long term (current) use of anticoagulants: Secondary | ICD-10-CM

## 2013-07-01 DIAGNOSIS — I517 Cardiomegaly: Secondary | ICD-10-CM | POA: Insufficient documentation

## 2013-07-01 DIAGNOSIS — J9819 Other pulmonary collapse: Secondary | ICD-10-CM | POA: Insufficient documentation

## 2013-07-01 LAB — BASIC METABOLIC PANEL
BUN: 55 mg/dL — ABNORMAL HIGH (ref 6–23)
CALCIUM: 9.3 mg/dL (ref 8.4–10.5)
CO2: 26 meq/L (ref 19–32)
CREATININE: 1.47 mg/dL — AB (ref 0.50–1.10)
Chloride: 93 mEq/L — ABNORMAL LOW (ref 96–112)
GFR, EST AFRICAN AMERICAN: 36 mL/min — AB (ref >90)
GFR, EST NON AFRICAN AMERICAN: 31 mL/min — AB (ref >90)
Glucose, Bld: 378 mg/dL — ABNORMAL HIGH (ref 70–99)
Potassium: 4.2 mEq/L (ref 3.7–5.3)
SODIUM: 133 meq/L — AB (ref 137–147)

## 2013-07-01 MED ORDER — AMIODARONE HCL 200 MG PO TABS: 200.0000 mg | ORAL_TABLET | Freq: Two times a day (BID) | ORAL | Status: AC

## 2013-07-01 NOTE — Progress Notes (Addendum)
Patient ID: [name] [name], female   DOB: 1924/07/27, 80 y.o.   MRN: [id number] PCP:  [name] Coombs, MD - [facility]   Weight Range 138-140  Baseline proBNP     HPI: [name] [name] is a very pleasant 80 y/o woman with h/o HTN, HL, DM2, mitral regurgitation (s/p MV repair 2/14), bradycardia (s/p Medtronic dual chamber pacemaker) and PAF.   Experienced PEA arrest at OSH in 2/14. She was transferred to [facility]. Found to have new onset AF with RVR with normal EF and severe MR due to annular dilation. She underwent heart cath which showed non-obstructive CAD. Unfortunately this was complicated by a large left groin bleed. She eventually underwent minimally Invasive Mitral Valve Repair utilizing a 26 mm Sorin Memo 3D ring for annuloplasty and a MAZE Procedure on 05/20/12. Was discharged on 05/27/2012.  Had atrial flutter in 06/06/12 S/P TEE Successful DC-CV. But 10/10/12 at office visit she was in and out of A fib.   09/07/12 ECHO EF 60-65%, s/p MV repair with mild to moderate MR and mild to possibly moderate mitral stenosis, moderate TR, Peak PA pressure 62  Admitted to [facility] 10/12/12 through 10/17/12 with increased dyspnea. Had R thoracentesis. Discharge weight 136 pounds. L upper chest  ICD site hematoma stable.   Follow up: Last visit increased torsemdie to 40 mg BID and received a dose of metolazone. We also increased potassium to 40 meq daily. Does not feel good today. +fatigue and not sleeping well d/t frequent urination. Still having some orthostatic hyptotension. Denies SOB, PND, or CP. +LE edema. SBP at home 120-130s. Denies palpitations. +DOE with minimal exertion. Following a low salt diet and drinking less than 2L a day. Weight at home 141-142 lbs.   Labs (10/14): K 4.5, Cr 1.13 Labs (11/14): K 3.8, creatinine 1.34 Labs (05/2013): K+ 3.9, Creatinine 1.58, BUN 41, TSH 4.599, free T3 1.7, Free T4 1.49  ROS: All systems negative except as listed in HPI, PMH and Problem List.  Past Medical History  Diagnosis  Date  . Hypertension   . Hyperlipidemia   . Type 2 diabetes mellitus   . Arthritis   . Coronary artery disease     a. Mild nonobstructive by cath 04/2012, cath complicated by groin hematoma.  . S/P mitral valve repair 05/20/2012    a. Severe MR - 04/2012: 26mm Sorin Memo 3D ring annuloplasty via right mini thoracotomy approach  . S/P Maze operation for atrial fibrillation 05/20/2012    a. 04/2012: Complete bi-atrial lesion set using cryothermy  . Atrial fibrillation     a. Diagnosed 04/2012 in the setting of severe MR. b. s/p TEE/successful DCCV 06/06/12.  Marland Kitchen PEA (Pulseless electrical activity)     a. 04/2012 -> adm with afib, developed PEA, transferred to [facility] for prolonged hospitalization.   . Chronic diastolic CHF (congestive heart failure)   . Symptomatic bradycardia     a. s/p Medtronic pacemaker 08/2012 (pocket hematoma)  . Moderate to severe pulmonary hypertension   . Pleural effusion     a. s/p thoracentesis 08/2012.  Marland Kitchen Hypoxemia     a. During admission 08/2012 for for UTI, hypoxia, FTT and pleural effusion s/p thoracentesis with transudative findings.  . Failure to thrive     a. Admission 08/2012.    Current Outpatient Prescriptions  Medication Sig Dispense Refill  . amiodarone (PACERONE) 200 MG tablet Take 1 tablet (200 mg total) by mouth daily.  30 tablet  3  . citalopram (CELEXA) 20 MG tablet  Take 20 mg by mouth daily.      . insulin glargine (LANTUS) 100 UNIT/ML injection Inject 10 Units into the skin at bedtime. IF CBG ELEVATED      . magnesium oxide (MAG-OX) 400 MG tablet Take 400 mg by mouth daily.      . potassium chloride SA (K-DUR,KLOR-CON) 20 MEQ tablet Take 2 tablets (40 mEq total) by mouth daily.  60 tablet  3  . rOPINIRole (REQUIP) 0.5 MG tablet Take 1 mg by mouth at bedtime.       . simvastatin (ZOCOR) 20 MG tablet Take 1 tablet (20 mg total) by mouth every evening.  30 tablet  0  . torsemide (DEMADEX) 20 MG tablet Take 2 tablets (40 mg total) by mouth 2 (two) times  daily.  120 tablet  3  . traMADol (ULTRAM) 50 MG tablet Take 50 mg by mouth at bedtime as needed.       . traZODone (DESYREL) 50 MG tablet Take 50 mg by mouth at bedtime.      Marland Kitchen. warfarin (COUMADIN) 3 MG tablet Take 3 mg by mouth daily.       No current facility-administered medications for this encounter.   Facility-Administered Medications Ordered in Other Encounters  Medication Dose Route Frequency Provider Last Rate Last Dose  . fentaNYL (SUBLIMAZE) injection    PRN [name] GoodieHolly M Weaver, CRNA   50 mcg at 05/15/12 0700  . midazolam (VERSED) 5 MG/5ML injection    PRN [name] GoodieHolly M Weaver, CRNA   0.5 mg at 05/15/12 0700    Filed Vitals:   07/01/13 1123  BP: 123/73  Pulse: 113  Resp: 20  Weight: 144 lb 6 oz (65.488 kg)  SpO2: 96%   Physical Exam:  General: Elderly. No resp difficulty;  Ambulates clinic independently. Daughter present HEENT: normal  Neck: supple. JVP 12. Carotids 2+ bilat; no bruits. No lymphadenopathy or thryomegaly appreciated.  Cor: PMI nondisplaced. Regular rate & rhythm. No rubs, gallops.  2/6 HSM apex.   Lungs: CTA diminished R side Abdomen: soft, nontender, nondistended. No hepatosplenomegaly. No bruits or masses. Good bowel sounds.  Extremities: no cyanosis, clubbing, rash, 2-3+ edema to knees bilaterally.  Neuro: alert & orientedx3, cranial nerves grossly intact. moves all 4 extremities w/o difficulty. Affect pleasant  EKG: V-paced 115 bpm   ASSESSMENT & PLAN: 1. A/Chronic Diastolic Heart Failure: EF 60-65% Peak PA pressure 62 (02/2013) - NYHA III/IVsymptoms and she remains volume overloaded on exam. We have continued to increase diuretics on the outpatient side with no success. In the past she has had large plaural effusion an decreased lung sounds on the R will get CXR. - Repeat BMET today. - Continue diuretics as ordered and may need to be admitted if fluid status not getting better. - Reinforced the need and importance of daily weights, a low sodium diet, and  fluid restriction (less than 2 L a day). Instructed to call the HF clinic if weight increases more than 3 lbs overnight or 5 lbs in a week.  2. A Fib: Reports not feeling very good. EKG showed Vpaced and device interrogated at bedside and patient in an atrial tach currently ~130-150 bpm. Her atrial rate has been 120-140s for about 5-10% beats. Not sure if this is contributing to her not feeling as well, she is in afib ~5% of the time. She has had an increased amount of afib since 04/2013. Switched upper tracking rate to 130 so she will not be V-paced as much. - She  has been on coumadin and cannot change to NOAC (valvular atrial fibrillation).  - Will increase amiodarone to 200 mg BID and see if she tolerates.   3. S/p mitral valve repair: Last echo showed mild to moderate MR and mild to possibly moderate mitral stenosis.  She has an MR-type murmur on exam.  Would probably repeat echo in 6/15 to follow mitral valve pathology.   4. H/o bradycardia: Has Medtronic PPM, gets regular followup. As above in atrial tach today and change  Followup in 1 week with MD.  Aundria Rud  07/01/2013  Addendum: CXR reviewed and small pleural effusion.

## 2013-07-06 ENCOUNTER — Encounter (HOSPITAL_COMMUNITY): Payer: Self-pay | Admitting: *Deleted

## 2013-07-06 ENCOUNTER — Ambulatory Visit (HOSPITAL_COMMUNITY)
Admission: RE | Admit: 2013-07-06 | Discharge: 2013-07-06 | Disposition: A | Discharge status: Home / Self Care | Payer: Medicare Other | Source: Ambulatory Visit | Attending: Internal Medicine | Admitting: Internal Medicine

## 2013-07-06 VITALS — BP 118/52 | HR 88 | Wt 145.0 lb

## 2013-07-06 DIAGNOSIS — I059 Rheumatic mitral valve disease, unspecified: Secondary | ICD-10-CM

## 2013-07-06 DIAGNOSIS — Z8679 Personal history of other diseases of the circulatory system: Secondary | ICD-10-CM

## 2013-07-06 DIAGNOSIS — I5032 Chronic diastolic (congestive) heart failure: Secondary | ICD-10-CM

## 2013-07-06 DIAGNOSIS — I340 Nonrheumatic mitral (valve) insufficiency: Secondary | ICD-10-CM

## 2013-07-06 DIAGNOSIS — I4891 Unspecified atrial fibrillation: Secondary | ICD-10-CM

## 2013-07-06 DIAGNOSIS — Z9889 Other specified postprocedural states: Secondary | ICD-10-CM

## 2013-07-06 NOTE — Patient Instructions (Signed)
Get INR checked on Mon 4/20 at PCP office  Your physician has requested that you have a TEE/Cardioversion. During a TEE, sound waves are used to create images of your heart. It provides your doctor with information about the size and shape of your heart and how well your heart's chambers and valves are working. In this test, a transducer is attached to the end of a flexible tube that is guided down you throat and into your esophagus (the tube leading from your mouth to your stomach) to get a more detailed image of your heart. Once the TEE has determined that a blood clot is not present, the cardioversion begins. Electrical Cardioversion uses a jolt of electricity to your heart either through paddles or wired patches attached to your chest. This is a controlled, usually prescheduled, procedure. This procedure is done at the hospital and you are not awake during the procedure. You usually go home the day of the procedure. Please see the instruction sheet given to you today for more information.  Your physician recommends that you schedule a follow-up appointment in: 3 weeks

## 2013-07-06 NOTE — Progress Notes (Signed)
Patient ID: [name] MassonKate L [name], female   DOB: [birth year], 80 y.o.   MRN: [id number] PCP:  Roe CoombsSusan Holt, MD - [facility]   Weight Range 138-140  Baseline proBNP     HPI: [name] [name] is a very pleasant 80 y/o woman with h/o HTN, HL, DM2, mitral regurgitation (s/p MV repair 2/14), bradycardia (s/p Medtronic dual chamber pacemaker) and PAF.   Experienced PEA arrest at OSH in 2/14. [facility] Found to have new onset AF with RVR with normal EF and severe MR due to annular dilation. She underwent heart cath which showed non-obstructive CAD. Unfortunately this was complicated by a large left groin bleed. She eventually underwent minimally Invasive Mitral Valve Repair utilizing a 26 mm Sorin Memo 3D ring for annuloplasty and a MAZE Procedure on 05/20/12. Was discharged on 05/27/2012.  Had atrial flutter in 06/06/12 S/P TEE Successful DC-CV. But 10/10/12 at office visit she was in and out of A fib.   09/07/12 ECHO EF 60-65%, s/p MV repair with mild to moderate MR and mild to possibly moderate mitral stenosis, moderate TR, Peak PA pressure 62  [facility] through 10/17/12 with increased dyspnea. Had R thoracentesis. Discharge weight 136 pounds. L upper chest  ICD site hematoma stable.   Follow up: Recently volume status getting worse. Unable to diurese easily. Renal function getting progressively worse. At last visit found to be in/out of atrial tach (~5-10% of time). PPM adjusted. Returns for f/u.   Last visit increased torsemdie to 40 mg BID and received a dose of metolazone. We also increased potassium to 40 meq daily. Does not feel good today. +fatigue and not sleeping well d/t frequent urination. Still having some orthostatic hyptotension. Denies SOB, PND, or CP. +LE edema. SBP at home 120-130s. Denies palpitations. +DOE with minimal exertion. Following a low salt diet and drinking less than 2L a day. Weight at home 141-142 lbs.   Labs (10/14): K 4.5, Cr 1.13 Labs (11/14): K 3.8,  creatinine 1.34 Labs (05/2013): K+ 3.9, Creatinine 1.58, BUN 41, TSH 4.599, free T3 1.7, Free T4 1.49  ROS: All systems negative except as listed in HPI, PMH and Problem List.  Past Medical History  Diagnosis Date  . Hypertension   . Hyperlipidemia   . Type 2 diabetes mellitus   . Arthritis   . Coronary artery disease     a. Mild nonobstructive by cath 04/2012, cath complicated by groin hematoma.  . S/P mitral valve repair 05/20/2012    a. Severe MR - 04/2012: 26mm Sorin Memo 3D ring annuloplasty via right mini thoracotomy approach  . S/P Maze operation for atrial fibrillation 05/20/2012    a. 04/2012: Complete bi-atrial lesion set using cryothermy  . Atrial fibrillation     a. Diagnosed 04/2012 in the setting of severe MR. b. s/p TEE/successful DCCV 06/06/12.  Marland Kitchen. PEA (Pulseless electrical activity)     a. 04/2012 -> adm with afib, developed PEA, [facility].   . Chronic diastolic CHF (congestive heart failure)   . Symptomatic bradycardia     a. s/p Medtronic pacemaker 08/2012 (pocket hematoma)  . Moderate to severe pulmonary hypertension   . Pleural effusion     a. s/p thoracentesis 08/2012.  Marland Kitchen. Hypoxemia     a. During admission 08/2012 for for UTI, hypoxia, FTT and pleural effusion s/p thoracentesis with transudative findings.  . Failure to thrive     a. Admission 08/2012.    Current Outpatient  Prescriptions  Medication Sig Dispense Refill  . amiodarone (PACERONE) 200 MG tablet Take 1 tablet (200 mg total) by mouth 2 (two) times daily.  60 tablet  3  . citalopram (CELEXA) 20 MG tablet Take 20 mg by mouth daily.      . insulin glargine (LANTUS) 100 UNIT/ML injection Inject 10 Units into the skin at bedtime. IF CBG ELEVATED      . magnesium oxide (MAG-OX) 400 MG tablet Take 400 mg by mouth daily.      . potassium chloride SA (K-DUR,KLOR-CON) 20 MEQ tablet Take 2 tablets (40 mEq total) by mouth daily.  60 tablet  3  . rOPINIRole (REQUIP) 0.5 MG tablet Take  1 mg by mouth at bedtime.       . simvastatin (ZOCOR) 20 MG tablet Take 1 tablet (20 mg total) by mouth every evening.  30 tablet  0  . torsemide (DEMADEX) 20 MG tablet Take 2 tablets (40 mg total) by mouth 2 (two) times daily.  120 tablet  3  . traMADol (ULTRAM) 50 MG tablet Take 50 mg by mouth at bedtime as needed.       . traZODone (DESYREL) 50 MG tablet Take 50 mg by mouth at bedtime.      Marland Kitchen. warfarin (COUMADIN) 3 MG tablet Take 3 mg by mouth daily.       No current facility-administered medications for this encounter.   Facility-Administered Medications Ordered in Other Encounters  Medication Dose Route Frequency Provider Last Rate Last Dose  . fentaNYL (SUBLIMAZE) injection    PRN Ellin GoodieHolly M Weaver, CRNA   50 mcg at 05/15/12 0700  . midazolam (VERSED) 5 MG/5ML injection    PRN Ellin GoodieHolly M Weaver, CRNA   0.5 mg at 05/15/12 0700    Filed Vitals:   07/06/13 1437  BP: 118/52  Pulse: 88  Weight: 145 lb (65.772 kg)  SpO2: 99%   Physical Exam:  General: Elderly. No resp difficulty;  Ambulates clinic independently. Daughter present HEENT: normal  Neck: supple. JVP 12. Carotids 2+ bilat; no bruits. No lymphadenopathy or thryomegaly appreciated.  Cor: PMI nondisplaced. Regular rate & rhythm. No rubs, gallops.  2/6 HSM apex.   Lungs: CTA diminished R side Abdomen: soft, nontender, nondistended. No hepatosplenomegaly. No bruits or masses. Good bowel sounds.  Extremities: no cyanosis, clubbing, rash, 2-3+ edema to knees bilaterally.  Neuro: alert & orientedx3, cranial nerves grossly intact. moves all 4 extremities w/o difficulty. Affect pleasant  EKG: V-paced 115 bpm   ASSESSMENT & PLAN: 1. A/Chronic Diastolic Heart Failure: EF 60-65% Peak PA pressure 62 (02/2013) -Continues with NYHA III-IIIB symptoms. Volume management remains quite difficult in setting of atrial tach/fib. She will need TEE/cardioversion once INR therapeutic 2. A Fib:  - Device interrogated personally in clinic and in  atrial tach 100% of time. She is not tolerating well at all.  - Amiodarone recently increased will continue - She has been on coumadin and cannot change to NOAC (valvular atrial fibrillation).  - TEE and DC-CV once INR > 2.0 3. S/p mitral valve repair: Last echo showed mild to moderate MR and mild to possibly moderate mitral stenosis.  She has an MR-type murmur on exam.  Would probably repeat echo in 6/15 to follow mitral valve pathology.    Total time spent 45 minutes not including device interrogation. Half that time spent discussing above.    Bevelyn BucklesDaniel R Pharaoh Pio  07/06/2013

## 2013-07-08 ENCOUNTER — Other Ambulatory Visit: Payer: Self-pay | Admitting: *Deleted

## 2013-07-13 ENCOUNTER — Telehealth (HOSPITAL_COMMUNITY): Payer: Self-pay | Admitting: Cardiology

## 2013-07-13 NOTE — Telephone Encounter (Signed)
Telephone call from pt PCP with INR results- 1.3 Cancel DCCV  until INR therapeutic per vo Dr. Gala RomneyBensimhon (2-3) Pt will have INR checked/managed with PCP, until therapeutic and they will call back with results so we can schedule

## 2013-07-16 ENCOUNTER — Ambulatory Visit (HOSPITAL_COMMUNITY): Admission: RE | Admit: 2013-07-16 | Payer: Medicare Other | Source: Ambulatory Visit | Admitting: Internal Medicine

## 2013-07-16 ENCOUNTER — Encounter (HOSPITAL_COMMUNITY): Admission: RE | Payer: Self-pay | Source: Ambulatory Visit

## 2013-07-16 SURGERY — ECHOCARDIOGRAM, TRANSESOPHAGEAL
Anesthesia: Moderate Sedation

## 2013-07-30 ENCOUNTER — Encounter (HOSPITAL_COMMUNITY): Payer: Medicare Other

## 2013-08-24 DEATH — deceased

## 2013-09-22 ENCOUNTER — Encounter: Payer: Self-pay | Admitting: *Deleted

## 2014-03-04 ENCOUNTER — Encounter (HOSPITAL_COMMUNITY): Payer: Self-pay | Admitting: Cardiovascular Disease

## 2014-04-08 ENCOUNTER — Encounter (HOSPITAL_COMMUNITY): Payer: Self-pay | Admitting: Thoracic Surgery (Cardiothoracic Vascular Surgery)

## 2014-07-12 ENCOUNTER — Ambulatory Visit: Payer: Self-pay | Admitting: Thoracic Surgery (Cardiothoracic Vascular Surgery)

## 2014-07-13 ENCOUNTER — Telehealth: Payer: Self-pay | Admitting: *Deleted

## 2015-02-01 IMAGING — CR DG CHEST 2V
1 series · 1 of 1 positions shown · non-contrast
Comparison: Chest x-rays dated 09/11/2012 and 09/05/2012

CLINICAL DATA: Right pleural effusion. Cough.

CHEST - 2 VIEW

[view not recorded]
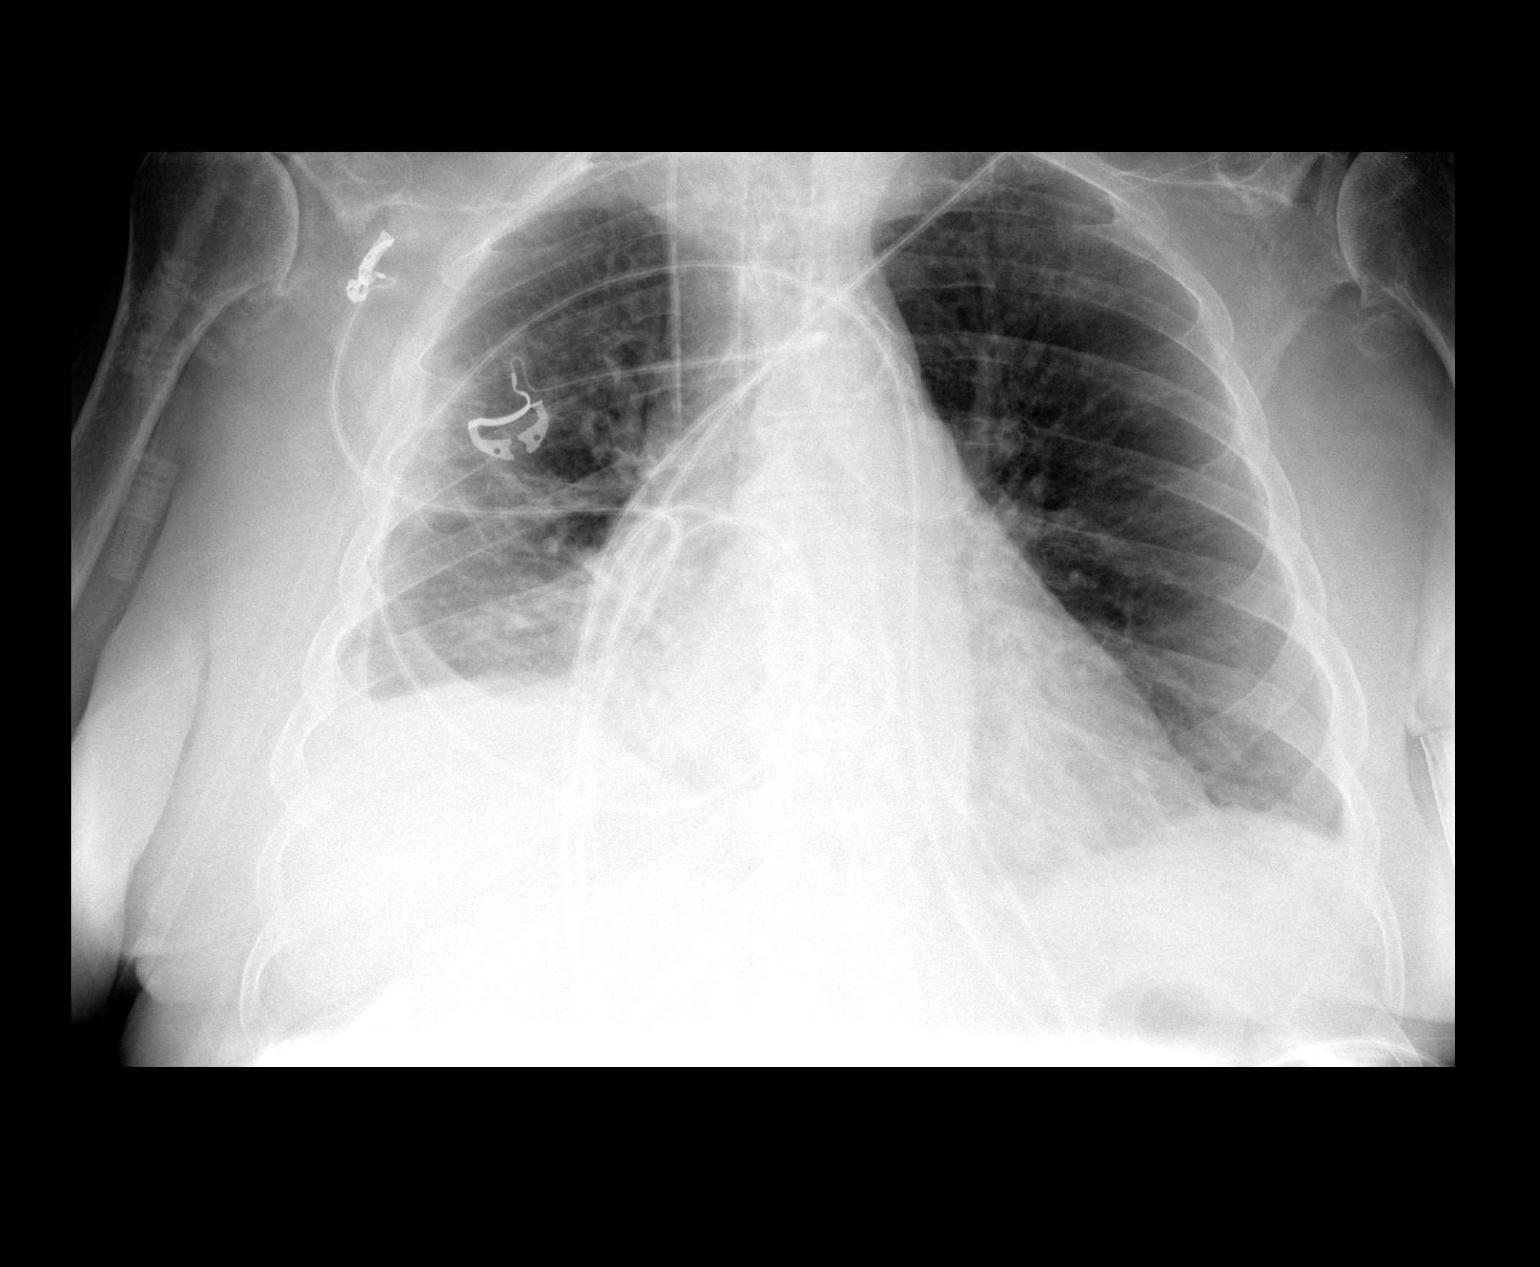

[1 of 1 positions shown; findings below may reference images not displayed]

FINDINGS: Central venous catheter tip is in good position in the superior
vena cava.  Heart size and vascularity are normal.

Slight decrease in the right base atelectasis and effusion.  There
is also a tiny left effusion.

No pneumothorax.  No acute osseous abnormality.
IMPRESSION: Slight decrease in right base atelectasis and effusion.  Small left
effusion.

## 2015-02-02 IMAGING — DX DG CHEST 1V PORT
1 series · 1 of 1 positions shown · non-contrast
Comparison: Portable exam 0238 hours compared to 09/12/2012

CLINICAL DATA: Shortness of breath, pulmonary edema, pleural
effusion

PORTABLE CHEST - 1 VIEW

[portable]
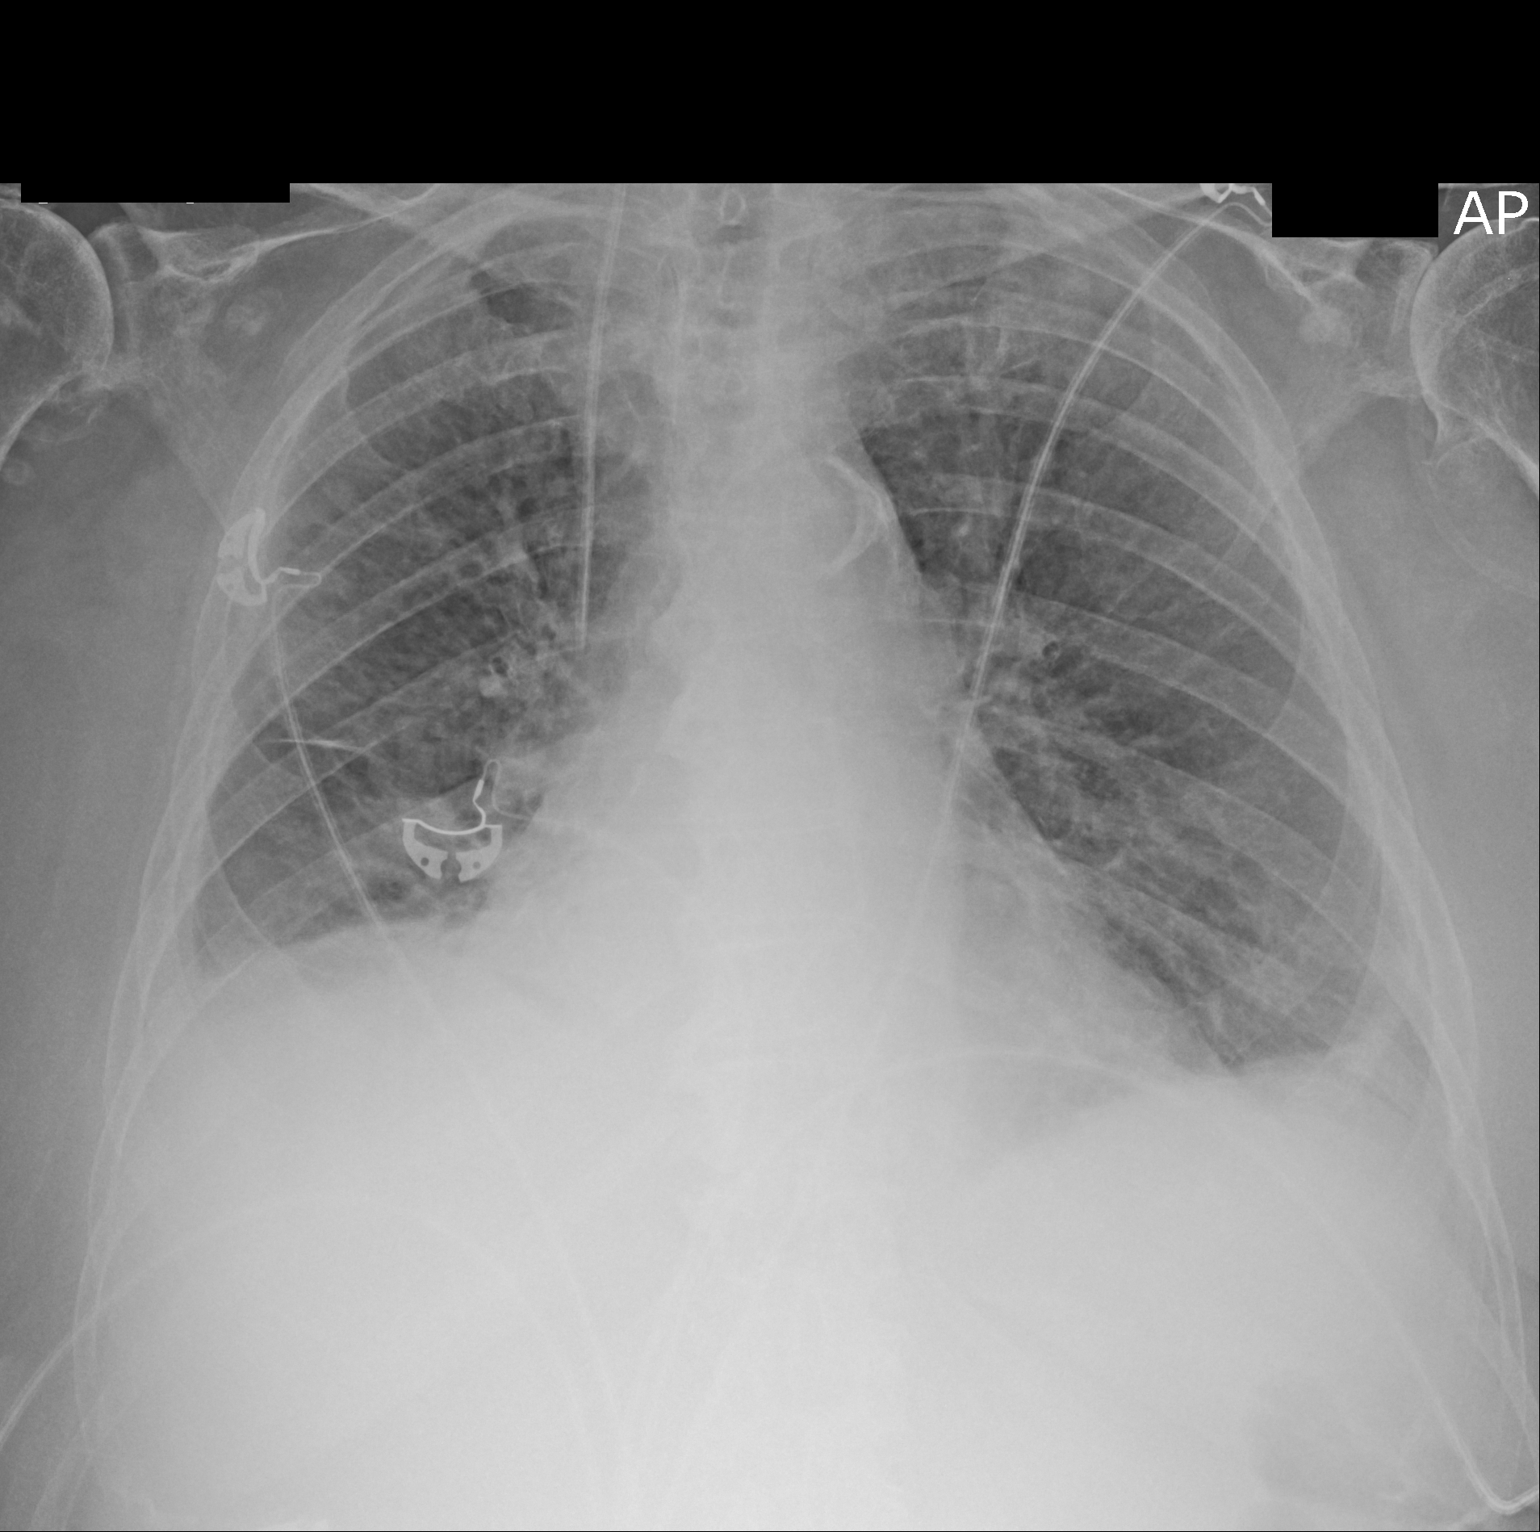

[1 of 1 positions shown; findings below may reference images not displayed]

FINDINGS: Right jugular central venous catheter tip projecting over SVC.
Enlargement of cardiac silhouette with pulmonary vascular
congestion.
Mild perihilar infiltrates likely edema.
Bibasilar effusions and atelectasis greater on the right.
No pneumothorax.
Bilateral glenohumeral degenerative changes and scattered thoracic
spine endplate spur formation.
IMPRESSION: Minimal pulmonary edema.
Bibasilar effusions and atelectasis greater on the right.

## 2015-02-09 IMAGING — CR DG CHEST 2V
2 series · 2 of 2 positions shown · non-contrast
Comparison: 09/13/2012

CLINICAL DATA: Pacemaker insertion

CHEST - 2 VIEW

[w chest lat]
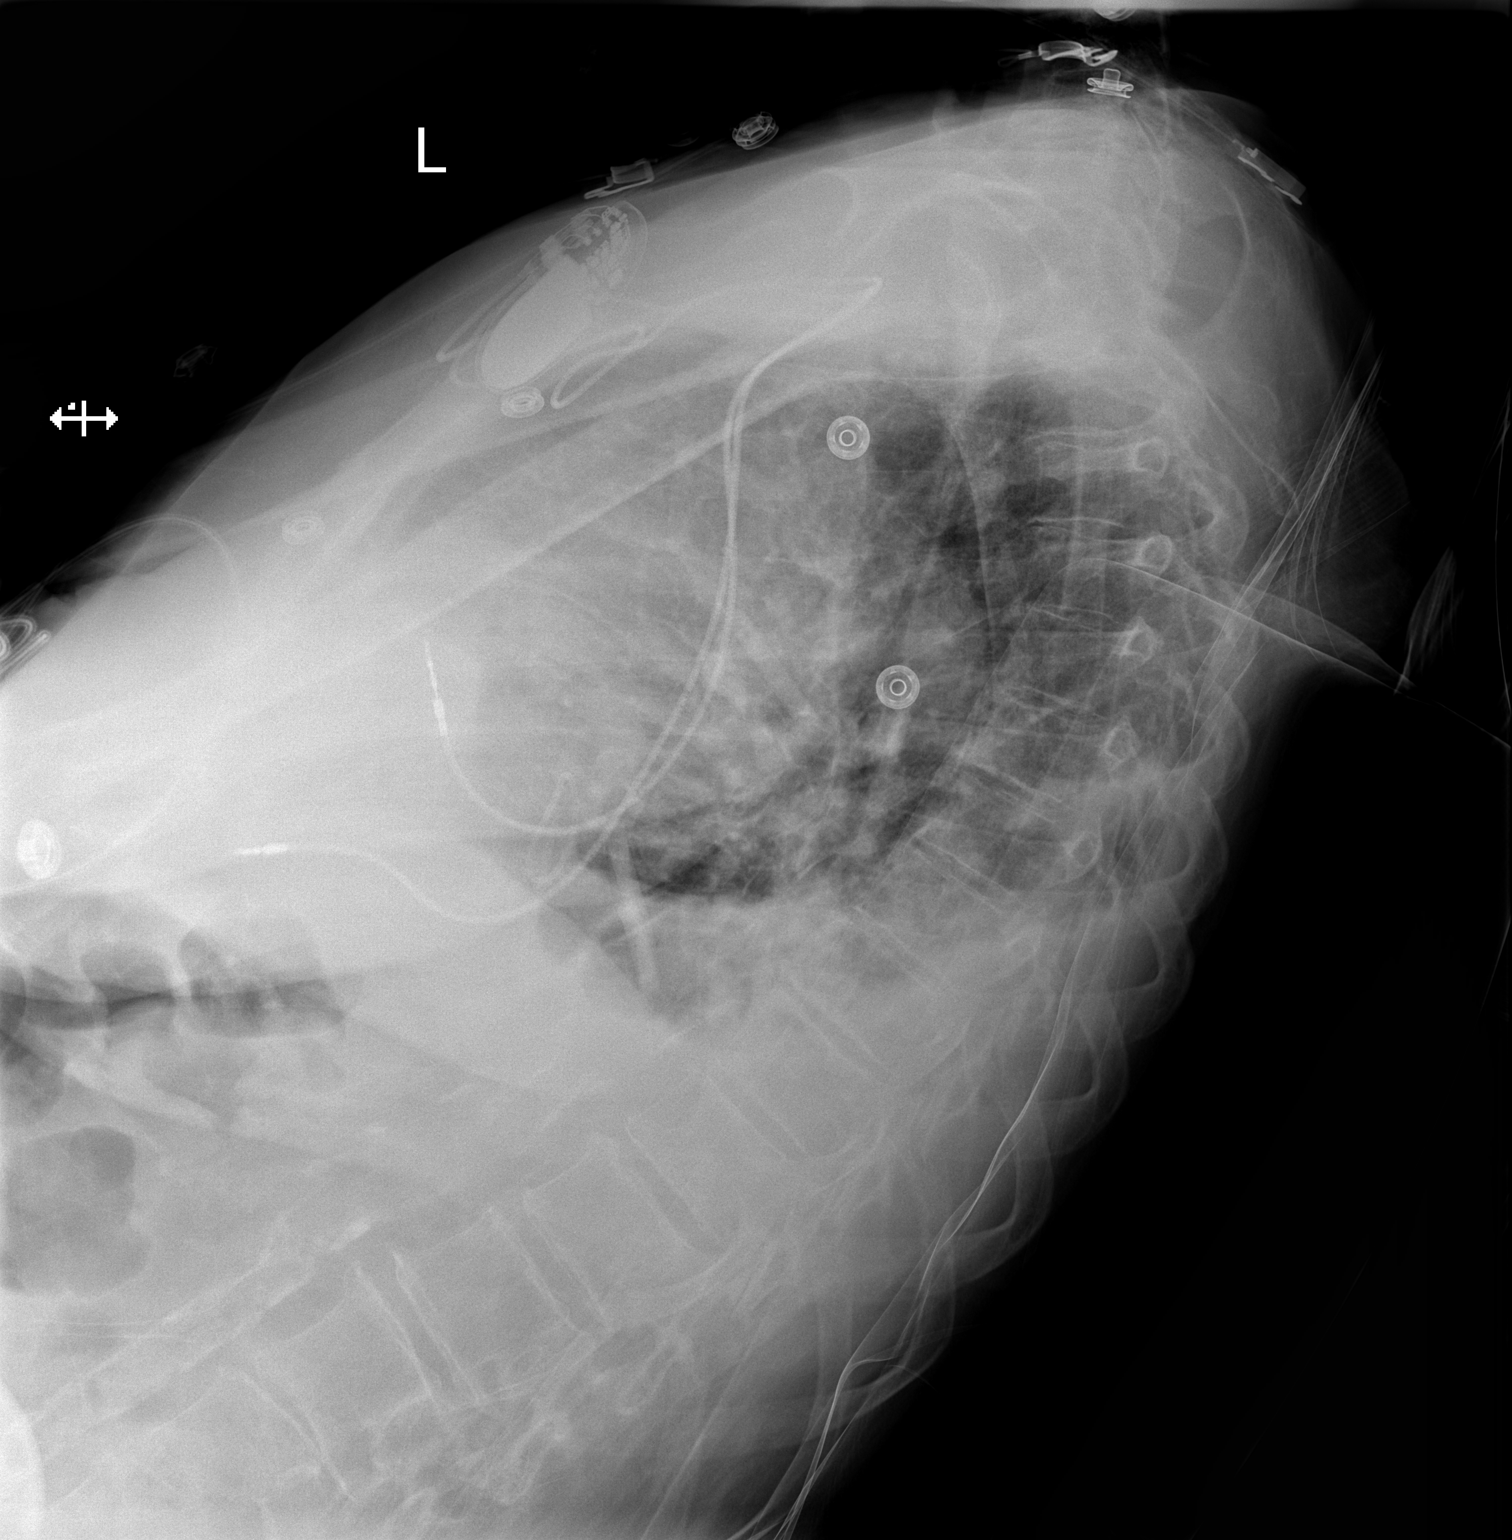

[x chest ap]
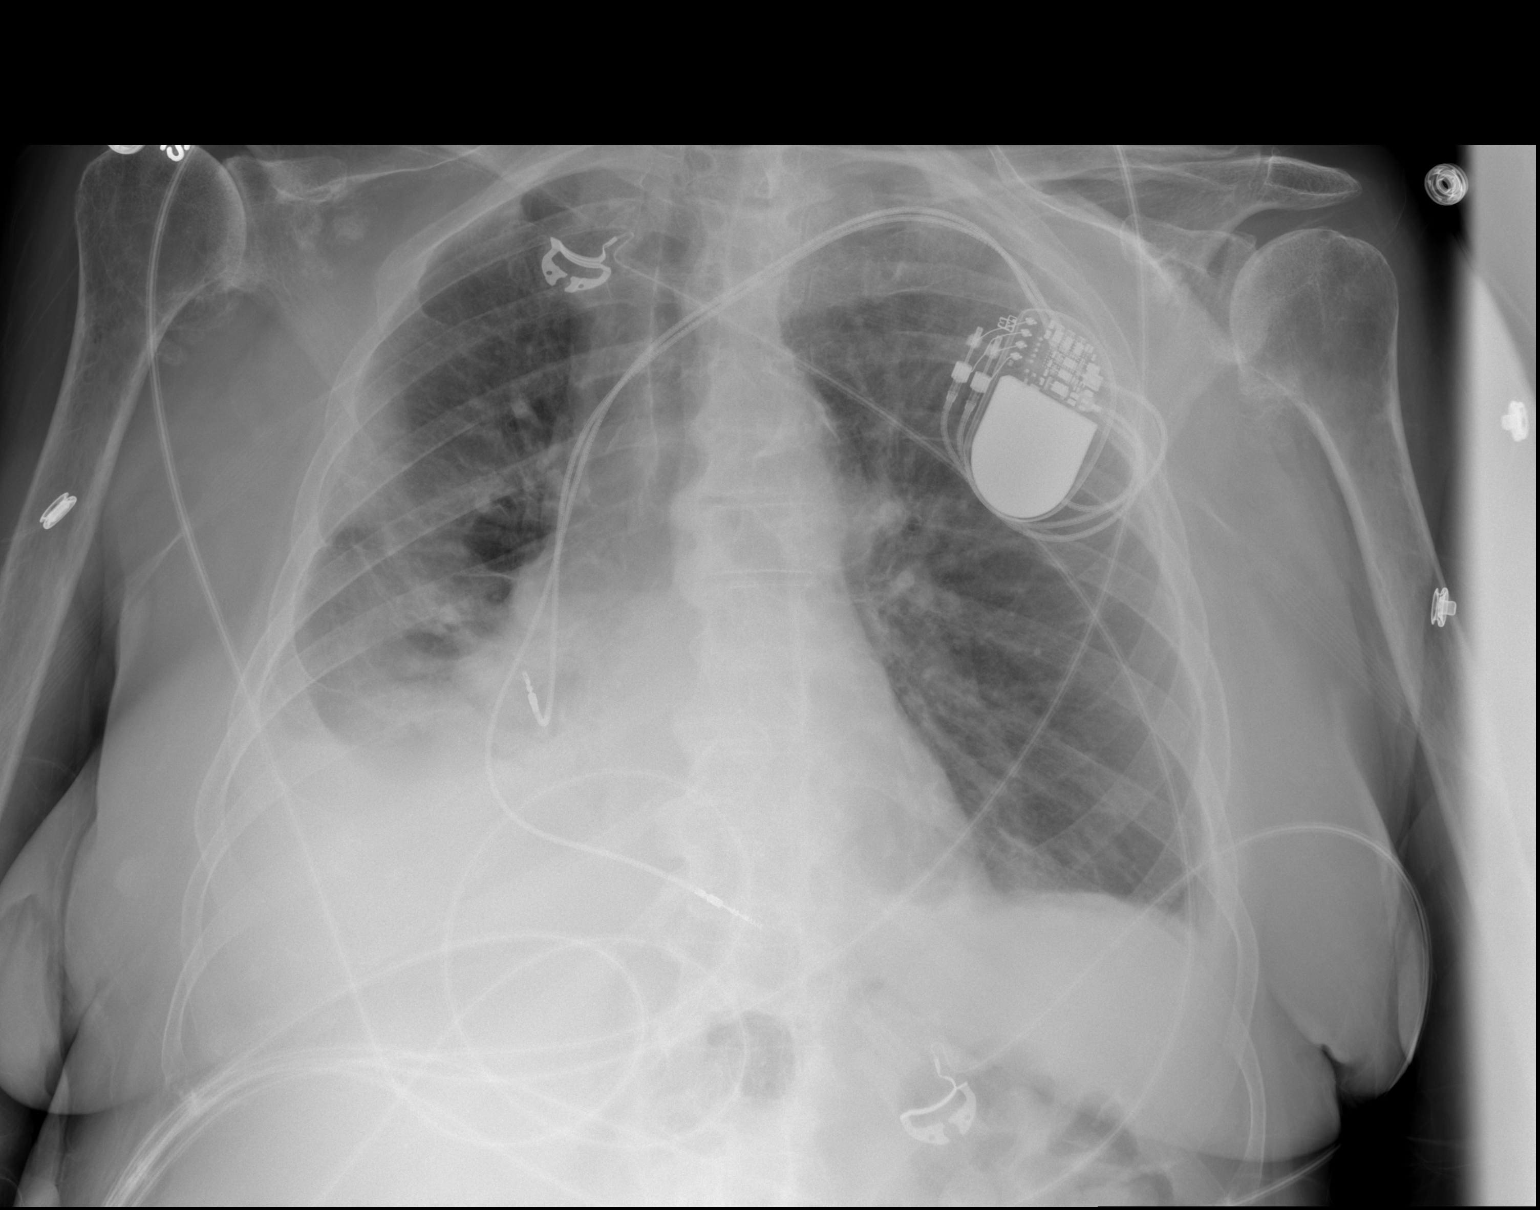

[2 of 2 positions shown; findings below may reference images not displayed]

FINDINGS: Dual lead left subclavian pacemaker device and leads have
been placed.  Leads are positioned in the right atrium and right
ventricle.  Right pleural effusion has increased.  Dense opacity at
the right base is worse with worsening shift of the mediastinum to
the right.  Left lung remains relatively clear with a small stable
left pleural effusion.  No evidence of pneumothorax.
IMPRESSION: Left subclavian pacemaker placement without evidence of
pneumothorax.

Increasing airspace disease and volume loss at the right lung base
with shift of mediastinum to the right.

Bilateral pleural effusions as described.
